# Patient Record
Sex: Male | Born: 1945 | Race: White | Hispanic: No | Marital: Married | Smoking: Former smoker
Health system: Southern US, Community
[De-identification: ages and names within clinical notes are randomized; demographics above are authoritative.]

## PROBLEM LIST (undated history)

## (undated) DIAGNOSIS — I4891 Unspecified atrial fibrillation: Secondary | ICD-10-CM

## (undated) DIAGNOSIS — G5600 Carpal tunnel syndrome, unspecified upper limb: Secondary | ICD-10-CM

## (undated) DIAGNOSIS — N200 Calculus of kidney: Secondary | ICD-10-CM

## (undated) DIAGNOSIS — I4892 Unspecified atrial flutter: Secondary | ICD-10-CM

## (undated) DIAGNOSIS — K644 Residual hemorrhoidal skin tags: Secondary | ICD-10-CM

## (undated) DIAGNOSIS — H409 Unspecified glaucoma: Secondary | ICD-10-CM

## (undated) DIAGNOSIS — E785 Hyperlipidemia, unspecified: Secondary | ICD-10-CM

## (undated) DIAGNOSIS — I10 Essential (primary) hypertension: Secondary | ICD-10-CM

## (undated) DIAGNOSIS — E039 Hypothyroidism, unspecified: Secondary | ICD-10-CM

## (undated) HISTORY — PX: CARDIOVERSION: SHX1299

## (undated) HISTORY — PX: OTHER SURGICAL HISTORY: SHX169

## (undated) HISTORY — DX: Calculus of kidney: N20.0

## (undated) HISTORY — DX: Unspecified atrial flutter: I48.92

## (undated) HISTORY — PX: JOINT REPLACEMENT: SHX530

## (undated) HISTORY — DX: Carpal tunnel syndrome, unspecified upper limb: G56.00

## (undated) HISTORY — DX: Hyperlipidemia, unspecified: E78.5

## (undated) HISTORY — PX: TONSILLECTOMY: SUR1361

## (undated) HISTORY — DX: Unspecified atrial fibrillation: I48.91

## (undated) HISTORY — DX: Residual hemorrhoidal skin tags: K64.4

## (undated) HISTORY — DX: Unspecified glaucoma: H40.9

## (undated) HISTORY — DX: Essential (primary) hypertension: I10

## (undated) HISTORY — PX: SQUAMOUS CELL CARCINOMA EXCISION: SHX2433

---

## 2000-03-29 ENCOUNTER — Ambulatory Visit (HOSPITAL_COMMUNITY): Admission: RE | Admit: 2000-03-29 | Discharge: 2000-03-29 | Payer: Self-pay | Admitting: Internal Medicine

## 2000-04-10 ENCOUNTER — Ambulatory Visit (HOSPITAL_COMMUNITY): Admission: RE | Admit: 2000-04-10 | Discharge: 2000-04-10 | Payer: Self-pay | Admitting: Internal Medicine

## 2000-04-10 ENCOUNTER — Encounter: Payer: Self-pay | Admitting: Internal Medicine

## 2000-04-17 ENCOUNTER — Ambulatory Visit (HOSPITAL_COMMUNITY): Admission: RE | Admit: 2000-04-17 | Discharge: 2000-04-17 | Payer: Self-pay | Admitting: Internal Medicine

## 2002-09-01 ENCOUNTER — Encounter: Payer: Self-pay | Admitting: Urology

## 2002-09-01 ENCOUNTER — Ambulatory Visit (HOSPITAL_BASED_OUTPATIENT_CLINIC_OR_DEPARTMENT_OTHER): Admission: RE | Admit: 2002-09-01 | Discharge: 2002-09-01 | Payer: Self-pay | Admitting: Urology

## 2002-09-11 ENCOUNTER — Ambulatory Visit (HOSPITAL_BASED_OUTPATIENT_CLINIC_OR_DEPARTMENT_OTHER): Admission: RE | Admit: 2002-09-11 | Discharge: 2002-09-11 | Payer: Self-pay | Admitting: Urology

## 2003-01-17 DIAGNOSIS — N200 Calculus of kidney: Secondary | ICD-10-CM

## 2003-01-17 HISTORY — PX: CARDIAC CATHETERIZATION: SHX172

## 2003-01-17 HISTORY — DX: Calculus of kidney: N20.0

## 2003-05-28 ENCOUNTER — Inpatient Hospital Stay (HOSPITAL_BASED_OUTPATIENT_CLINIC_OR_DEPARTMENT_OTHER): Admission: RE | Admit: 2003-05-28 | Discharge: 2003-05-28 | Payer: Self-pay | Admitting: Cardiology

## 2004-01-06 ENCOUNTER — Ambulatory Visit: Payer: Self-pay | Admitting: Internal Medicine

## 2004-05-06 ENCOUNTER — Ambulatory Visit: Payer: Self-pay | Admitting: Gastroenterology

## 2004-06-03 ENCOUNTER — Ambulatory Visit: Payer: Self-pay | Admitting: Gastroenterology

## 2004-06-03 ENCOUNTER — Encounter (INDEPENDENT_AMBULATORY_CARE_PROVIDER_SITE_OTHER): Payer: Self-pay | Admitting: *Deleted

## 2004-06-09 ENCOUNTER — Ambulatory Visit: Payer: Self-pay | Admitting: Internal Medicine

## 2004-07-07 ENCOUNTER — Ambulatory Visit: Payer: Self-pay | Admitting: Internal Medicine

## 2004-08-18 ENCOUNTER — Ambulatory Visit: Payer: Self-pay | Admitting: Internal Medicine

## 2004-10-17 ENCOUNTER — Ambulatory Visit: Payer: Self-pay | Admitting: Internal Medicine

## 2004-10-25 ENCOUNTER — Ambulatory Visit: Payer: Self-pay | Admitting: Internal Medicine

## 2004-11-04 ENCOUNTER — Ambulatory Visit: Payer: Self-pay | Admitting: Internal Medicine

## 2004-11-14 ENCOUNTER — Ambulatory Visit: Payer: Self-pay | Admitting: Internal Medicine

## 2005-04-14 ENCOUNTER — Ambulatory Visit: Payer: Self-pay | Admitting: Internal Medicine

## 2005-07-18 ENCOUNTER — Ambulatory Visit: Payer: Self-pay | Admitting: Internal Medicine

## 2005-10-26 ENCOUNTER — Ambulatory Visit: Payer: Self-pay | Admitting: Internal Medicine

## 2005-11-02 ENCOUNTER — Ambulatory Visit: Payer: Self-pay

## 2006-01-16 HISTORY — PX: TOTAL HIP ARTHROPLASTY: SHX124

## 2006-02-20 ENCOUNTER — Ambulatory Visit: Payer: Self-pay | Admitting: Internal Medicine

## 2006-02-20 LAB — CONVERTED CEMR LAB
Creatinine,U: 236.2 mg/dL
Hgb A1c MFr Bld: 6.4 % — ABNORMAL HIGH (ref 4.6–6.0)
INR: 2.5 — ABNORMAL HIGH (ref 0.9–2.0)
Microalb Creat Ratio: 5.5 mg/g (ref 0.0–30.0)
Microalb, Ur: 1.3 mg/dL (ref 0.0–1.9)
Prothrombin Time: 20 s — ABNORMAL HIGH (ref 10.0–14.0)

## 2006-03-16 ENCOUNTER — Ambulatory Visit: Payer: Self-pay | Admitting: Internal Medicine

## 2006-03-30 ENCOUNTER — Ambulatory Visit: Payer: Self-pay | Admitting: Internal Medicine

## 2006-03-30 LAB — CONVERTED CEMR LAB
INR: 3.1 — ABNORMAL HIGH (ref 0.0–1.5)
Prothrombin Time: 34.2 s — ABNORMAL HIGH (ref 11.6–15.2)

## 2006-04-16 ENCOUNTER — Ambulatory Visit: Payer: Self-pay | Admitting: Internal Medicine

## 2006-04-27 ENCOUNTER — Ambulatory Visit: Payer: Self-pay | Admitting: Internal Medicine

## 2006-04-27 ENCOUNTER — Encounter: Admission: RE | Admit: 2006-04-27 | Discharge: 2006-04-27 | Payer: Self-pay | Admitting: Orthopedic Surgery

## 2006-05-10 ENCOUNTER — Ambulatory Visit: Payer: Self-pay | Admitting: Internal Medicine

## 2006-05-10 LAB — CONVERTED CEMR LAB
INR: 2.6 — ABNORMAL HIGH (ref 0.0–1.5)
Prothrombin Time: 29.7 s — ABNORMAL HIGH (ref 11.6–15.2)

## 2006-06-25 ENCOUNTER — Ambulatory Visit: Payer: Self-pay | Admitting: Internal Medicine

## 2006-06-26 LAB — CONVERTED CEMR LAB
ALT: 20 units/L (ref 0–40)
AST: 23 units/L (ref 0–37)
BUN: 32 mg/dL — ABNORMAL HIGH (ref 6–23)
Cholesterol: 144 mg/dL (ref 0–200)
Creatinine, Ser: 1.2 mg/dL (ref 0.4–1.5)
Creatinine,U: 195 mg/dL
HDL: 48.8 mg/dL (ref >39.0)
Hgb A1c MFr Bld: 6.1 % — ABNORMAL HIGH (ref 4.6–6.0)
INR: 2.2 — ABNORMAL HIGH (ref 0.9–2.0)
LDL Cholesterol: 78 mg/dL (ref 0–99)
Microalb Creat Ratio: 5.1 mg/g (ref 0.0–30.0)
Microalb, Ur: 1 mg/dL (ref 0.0–1.9)
Prothrombin Time: 18.4 s — ABNORMAL HIGH (ref 10.0–14.0)
Total CHOL/HDL Ratio: 3
Triglycerides: 87 mg/dL (ref 0–149)
VLDL: 17 mg/dL (ref 0–40)

## 2006-07-19 ENCOUNTER — Telehealth: Payer: Self-pay | Admitting: Internal Medicine

## 2006-07-25 ENCOUNTER — Telehealth: Payer: Self-pay | Admitting: Internal Medicine

## 2006-07-31 ENCOUNTER — Ambulatory Visit: Payer: Self-pay | Admitting: Internal Medicine

## 2006-07-31 DIAGNOSIS — M1610 Unilateral primary osteoarthritis, unspecified hip: Secondary | ICD-10-CM | POA: Insufficient documentation

## 2006-07-31 DIAGNOSIS — M169 Osteoarthritis of hip, unspecified: Secondary | ICD-10-CM | POA: Insufficient documentation

## 2006-08-07 ENCOUNTER — Inpatient Hospital Stay (HOSPITAL_COMMUNITY): Admission: RE | Admit: 2006-08-07 | Discharge: 2006-08-10 | Payer: Self-pay | Admitting: Orthopedic Surgery

## 2006-08-07 ENCOUNTER — Encounter: Payer: Self-pay | Admitting: Internal Medicine

## 2006-08-17 ENCOUNTER — Telehealth: Payer: Self-pay | Admitting: Internal Medicine

## 2006-09-07 ENCOUNTER — Ambulatory Visit: Payer: Self-pay | Admitting: Cardiovascular Disease

## 2006-09-10 ENCOUNTER — Ambulatory Visit: Payer: Self-pay | Admitting: Internal Medicine

## 2006-09-10 LAB — CONVERTED CEMR LAB
BUN: 35 mg/dL — ABNORMAL HIGH (ref 6–23)
Basophils Absolute: 0.1 10*3/uL (ref 0.0–0.1)
Basophils Relative: 1.2 % — ABNORMAL HIGH (ref 0.0–1.0)
CO2: 27 meq/L (ref 19–32)
Calcium: 10.7 mg/dL — ABNORMAL HIGH (ref 8.4–10.5)
Chloride: 104 meq/L (ref 96–112)
Creatinine, Ser: 1.6 mg/dL — ABNORMAL HIGH (ref 0.4–1.5)
Eosinophils Absolute: 0.3 10*3/uL (ref 0.0–0.6)
Eosinophils Relative: 4.8 % (ref 0.0–5.0)
GFR calc Af Amer: 57 mL/min
GFR calc non Af Amer: 47 mL/min
Glucose, Bld: 125 mg/dL — ABNORMAL HIGH (ref 70–99)
HCT: 37.9 % — ABNORMAL LOW (ref 39.0–52.0)
Hemoglobin: 12.7 g/dL — ABNORMAL LOW (ref 13.0–17.0)
INR: 3.4 — ABNORMAL HIGH (ref 0.8–1.0)
Lymphocytes Relative: 25.7 % (ref 12.0–46.0)
MCHC: 33.4 g/dL (ref 30.0–36.0)
MCV: 88.7 fL (ref 78.0–100.0)
Magnesium: 1.8 mg/dL (ref 1.5–2.5)
Monocytes Absolute: 0.7 10*3/uL (ref 0.2–0.7)
Monocytes Relative: 10.8 % (ref 3.0–11.0)
Neutro Abs: 3.5 10*3/uL (ref 1.4–7.7)
Neutrophils Relative %: 57.5 % (ref 43.0–77.0)
Platelets: 261 10*3/uL (ref 150–400)
Potassium: 3.9 meq/L (ref 3.5–5.1)
Prothrombin Time: 23.3 s — ABNORMAL HIGH (ref 10.9–13.3)
RBC: 4.27 M/uL (ref 4.22–5.81)
RDW: 14.6 % (ref 11.5–14.6)
Sodium: 140 meq/L (ref 135–145)
WBC: 6.2 10*3/uL (ref 4.5–10.5)
aPTT: 56 s — ABNORMAL HIGH (ref 21.7–29.8)

## 2006-09-12 ENCOUNTER — Ambulatory Visit (HOSPITAL_COMMUNITY): Admission: RE | Admit: 2006-09-12 | Discharge: 2006-09-12 | Payer: Self-pay | Admitting: Internal Medicine

## 2006-09-12 ENCOUNTER — Ambulatory Visit: Payer: Self-pay | Admitting: Internal Medicine

## 2006-09-18 ENCOUNTER — Ambulatory Visit: Payer: Self-pay | Admitting: Internal Medicine

## 2006-10-31 ENCOUNTER — Encounter: Payer: Self-pay | Admitting: *Deleted

## 2006-10-31 LAB — CONVERTED CEMR LAB: INR: 2.6

## 2006-11-15 ENCOUNTER — Telehealth (INDEPENDENT_AMBULATORY_CARE_PROVIDER_SITE_OTHER): Payer: Self-pay | Admitting: *Deleted

## 2006-11-19 ENCOUNTER — Telehealth (INDEPENDENT_AMBULATORY_CARE_PROVIDER_SITE_OTHER): Payer: Self-pay | Admitting: *Deleted

## 2006-11-27 ENCOUNTER — Telehealth (INDEPENDENT_AMBULATORY_CARE_PROVIDER_SITE_OTHER): Payer: Self-pay | Admitting: *Deleted

## 2006-12-21 ENCOUNTER — Ambulatory Visit: Payer: Self-pay | Admitting: Internal Medicine

## 2007-01-04 ENCOUNTER — Encounter: Payer: Self-pay | Admitting: Internal Medicine

## 2007-01-07 ENCOUNTER — Telehealth: Payer: Self-pay | Admitting: Internal Medicine

## 2007-01-07 LAB — CONVERTED CEMR LAB
INR: 2.3 — ABNORMAL HIGH (ref 0.0–1.5)
Prothrombin Time: 25.6 s — ABNORMAL HIGH (ref 11.6–15.2)

## 2007-01-17 HISTORY — PX: COLONOSCOPY: SHX174

## 2007-03-02 ENCOUNTER — Encounter: Payer: Self-pay | Admitting: Internal Medicine

## 2007-03-05 ENCOUNTER — Encounter (INDEPENDENT_AMBULATORY_CARE_PROVIDER_SITE_OTHER): Payer: Self-pay | Admitting: *Deleted

## 2007-03-05 LAB — CONVERTED CEMR LAB
INR: 2.6 — ABNORMAL HIGH (ref 0.0–1.5)
Prothrombin Time: 28.9 s — ABNORMAL HIGH (ref 11.6–15.2)

## 2007-03-21 ENCOUNTER — Telehealth (INDEPENDENT_AMBULATORY_CARE_PROVIDER_SITE_OTHER): Payer: Self-pay | Admitting: *Deleted

## 2007-04-12 ENCOUNTER — Encounter: Payer: Self-pay | Admitting: Internal Medicine

## 2007-04-23 LAB — CONVERTED CEMR LAB
INR: 2.6 — ABNORMAL HIGH (ref 0.0–1.5)
Prothrombin Time: 28.6 s — ABNORMAL HIGH (ref 11.6–15.2)

## 2007-04-24 ENCOUNTER — Encounter (INDEPENDENT_AMBULATORY_CARE_PROVIDER_SITE_OTHER): Payer: Self-pay | Admitting: *Deleted

## 2007-04-25 ENCOUNTER — Encounter (INDEPENDENT_AMBULATORY_CARE_PROVIDER_SITE_OTHER): Payer: Self-pay | Admitting: *Deleted

## 2007-04-25 ENCOUNTER — Ambulatory Visit: Payer: Self-pay | Admitting: Internal Medicine

## 2007-04-25 DIAGNOSIS — N401 Enlarged prostate with lower urinary tract symptoms: Secondary | ICD-10-CM

## 2007-04-25 DIAGNOSIS — N138 Other obstructive and reflux uropathy: Secondary | ICD-10-CM | POA: Insufficient documentation

## 2007-04-30 ENCOUNTER — Ambulatory Visit: Payer: Self-pay | Admitting: Gastroenterology

## 2007-04-30 LAB — CONVERTED CEMR LAB
ALT: 18 units/L (ref 0–53)
AST: 20 units/L (ref 0–37)
Albumin: 4.3 g/dL (ref 3.5–5.2)
Alkaline Phosphatase: 23 units/L — ABNORMAL LOW (ref 39–117)
BUN: 35 mg/dL — ABNORMAL HIGH (ref 6–23)
Basophils Absolute: 0 10*3/uL (ref 0.0–0.1)
Basophils Relative: 0.1 % (ref 0.0–1.0)
Bilirubin, Direct: 0.1 mg/dL (ref 0.0–0.3)
Cholesterol: 141 mg/dL (ref 0–200)
Creatinine, Ser: 1.4 mg/dL (ref 0.4–1.5)
Creatinine,U: 138.1 mg/dL
Eosinophils Absolute: 0.5 10*3/uL (ref 0.0–0.7)
Eosinophils Relative: 8.3 % — ABNORMAL HIGH (ref 0.0–5.0)
HCT: 41.1 % (ref 39.0–52.0)
HDL: 46.8 mg/dL (ref >39.0)
Hemoglobin: 13.5 g/dL (ref 13.0–17.0)
Hgb A1c MFr Bld: 6.1 % — ABNORMAL HIGH (ref 4.6–6.0)
LDL Cholesterol: 76 mg/dL (ref 0–99)
Lymphocytes Relative: 26.7 % (ref 12.0–46.0)
MCHC: 32.9 g/dL (ref 30.0–36.0)
MCV: 89 fL (ref 78.0–100.0)
Microalb Creat Ratio: 38.4 mg/g — ABNORMAL HIGH (ref 0.0–30.0)
Microalb, Ur: 5.3 mg/dL — ABNORMAL HIGH (ref 0.0–1.9)
Monocytes Absolute: 0.6 10*3/uL (ref 0.1–1.0)
Monocytes Relative: 9.8 % (ref 3.0–12.0)
Neutro Abs: 3.5 10*3/uL (ref 1.4–7.7)
Neutrophils Relative %: 55.1 % (ref 43.0–77.0)
PSA: 0.76 ng/mL (ref 0.10–4.00)
Platelets: 234 10*3/uL (ref 150–400)
Potassium: 4.4 meq/L (ref 3.5–5.1)
RBC: 4.62 M/uL (ref 4.22–5.81)
RDW: 14.7 % — ABNORMAL HIGH (ref 11.5–14.6)
TSH: 2.72 microintl units/mL (ref 0.35–5.50)
Total Bilirubin: 0.6 mg/dL (ref 0.3–1.2)
Total CHOL/HDL Ratio: 3
Total Protein: 7.4 g/dL (ref 6.0–8.3)
Triglycerides: 91 mg/dL (ref 0–149)
VLDL: 18 mg/dL (ref 0–40)
WBC: 6.3 10*3/uL (ref 4.5–10.5)

## 2007-05-01 ENCOUNTER — Encounter: Payer: Self-pay | Admitting: Internal Medicine

## 2007-05-01 ENCOUNTER — Encounter (INDEPENDENT_AMBULATORY_CARE_PROVIDER_SITE_OTHER): Payer: Self-pay | Admitting: *Deleted

## 2007-05-01 LAB — CONVERTED CEMR LAB
Cholesterol, target level: 200 mg/dL
HDL goal, serum: 40 mg/dL
LDL Goal: 100 mg/dL

## 2007-05-06 ENCOUNTER — Encounter: Payer: Self-pay | Admitting: Internal Medicine

## 2007-05-06 ENCOUNTER — Ambulatory Visit: Payer: Self-pay | Admitting: Gastroenterology

## 2007-05-06 LAB — HM COLONOSCOPY

## 2007-05-25 ENCOUNTER — Encounter: Payer: Self-pay | Admitting: Internal Medicine

## 2007-05-30 LAB — CONVERTED CEMR LAB
INR: 2.4 — ABNORMAL HIGH (ref 0.0–1.5)
Prothrombin Time: 26.7 s — ABNORMAL HIGH (ref 11.6–15.2)

## 2007-07-16 ENCOUNTER — Telehealth: Payer: Self-pay | Admitting: Internal Medicine

## 2007-07-22 ENCOUNTER — Telehealth: Payer: Self-pay | Admitting: Internal Medicine

## 2007-08-09 ENCOUNTER — Encounter: Payer: Self-pay | Admitting: Internal Medicine

## 2007-08-09 ENCOUNTER — Telehealth (INDEPENDENT_AMBULATORY_CARE_PROVIDER_SITE_OTHER): Payer: Self-pay | Admitting: *Deleted

## 2007-09-06 ENCOUNTER — Ambulatory Visit: Payer: Self-pay | Admitting: Internal Medicine

## 2007-09-06 DIAGNOSIS — E1159 Type 2 diabetes mellitus with other circulatory complications: Secondary | ICD-10-CM | POA: Insufficient documentation

## 2007-09-06 DIAGNOSIS — E785 Hyperlipidemia, unspecified: Secondary | ICD-10-CM | POA: Insufficient documentation

## 2007-09-06 DIAGNOSIS — G5600 Carpal tunnel syndrome, unspecified upper limb: Secondary | ICD-10-CM | POA: Insufficient documentation

## 2007-09-06 DIAGNOSIS — I10 Essential (primary) hypertension: Secondary | ICD-10-CM | POA: Insufficient documentation

## 2007-09-06 DIAGNOSIS — E119 Type 2 diabetes mellitus without complications: Secondary | ICD-10-CM | POA: Insufficient documentation

## 2007-09-06 DIAGNOSIS — E1169 Type 2 diabetes mellitus with other specified complication: Secondary | ICD-10-CM | POA: Insufficient documentation

## 2007-09-18 ENCOUNTER — Telehealth (INDEPENDENT_AMBULATORY_CARE_PROVIDER_SITE_OTHER): Payer: Self-pay | Admitting: *Deleted

## 2007-10-21 ENCOUNTER — Ambulatory Visit: Payer: Self-pay | Admitting: Internal Medicine

## 2007-10-21 LAB — CONVERTED CEMR LAB: INR: 3

## 2007-12-09 ENCOUNTER — Ambulatory Visit: Payer: Self-pay | Admitting: Internal Medicine

## 2007-12-09 LAB — CONVERTED CEMR LAB: INR: 3.3

## 2007-12-16 ENCOUNTER — Ambulatory Visit: Payer: Self-pay | Admitting: Internal Medicine

## 2008-01-24 ENCOUNTER — Ambulatory Visit: Payer: Self-pay | Admitting: Internal Medicine

## 2008-01-24 LAB — CONVERTED CEMR LAB: INR: 2.3

## 2008-02-04 ENCOUNTER — Telehealth (INDEPENDENT_AMBULATORY_CARE_PROVIDER_SITE_OTHER): Payer: Self-pay | Admitting: *Deleted

## 2008-02-27 ENCOUNTER — Ambulatory Visit: Payer: Self-pay | Admitting: Internal Medicine

## 2008-02-27 LAB — CONVERTED CEMR LAB: INR: 2.9

## 2008-04-01 ENCOUNTER — Ambulatory Visit: Payer: Self-pay | Admitting: Internal Medicine

## 2008-04-08 ENCOUNTER — Telehealth (INDEPENDENT_AMBULATORY_CARE_PROVIDER_SITE_OTHER): Payer: Self-pay | Admitting: *Deleted

## 2008-04-08 LAB — CONVERTED CEMR LAB
Creatinine,U: 111.3 mg/dL
Hgb A1c MFr Bld: 6 % (ref 4.6–6.5)
INR: 2 — ABNORMAL HIGH (ref 0.8–1.0)
Microalb Creat Ratio: 14.4 mg/g (ref 0.0–30.0)
Microalb, Ur: 1.6 mg/dL (ref 0.0–1.9)
Prothrombin Time: 20.7 s — ABNORMAL HIGH (ref 10.9–13.3)

## 2008-04-14 ENCOUNTER — Ambulatory Visit (HOSPITAL_COMMUNITY): Admission: RE | Admit: 2008-04-14 | Discharge: 2008-04-14 | Payer: Self-pay | Admitting: Internal Medicine

## 2008-04-14 ENCOUNTER — Ambulatory Visit: Payer: Self-pay | Admitting: Internal Medicine

## 2008-04-24 ENCOUNTER — Ambulatory Visit (HOSPITAL_COMMUNITY): Admission: RE | Admit: 2008-04-24 | Discharge: 2008-04-24 | Payer: Self-pay | Admitting: Internal Medicine

## 2008-04-24 ENCOUNTER — Ambulatory Visit: Payer: Self-pay | Admitting: Internal Medicine

## 2008-04-28 ENCOUNTER — Encounter: Payer: Self-pay | Admitting: Internal Medicine

## 2008-04-28 ENCOUNTER — Ambulatory Visit: Payer: Self-pay | Admitting: Internal Medicine

## 2008-05-01 ENCOUNTER — Ambulatory Visit: Payer: Self-pay | Admitting: Internal Medicine

## 2008-05-01 DIAGNOSIS — R6882 Decreased libido: Secondary | ICD-10-CM | POA: Insufficient documentation

## 2008-05-01 DIAGNOSIS — F528 Other sexual dysfunction not due to a substance or known physiological condition: Secondary | ICD-10-CM | POA: Insufficient documentation

## 2008-05-01 LAB — CONVERTED CEMR LAB: INR: 2.4

## 2008-05-04 ENCOUNTER — Telehealth: Payer: Self-pay | Admitting: Internal Medicine

## 2008-05-08 ENCOUNTER — Ambulatory Visit: Payer: Self-pay | Admitting: Internal Medicine

## 2008-05-08 ENCOUNTER — Encounter: Payer: Self-pay | Admitting: Internal Medicine

## 2008-05-08 ENCOUNTER — Encounter (INDEPENDENT_AMBULATORY_CARE_PROVIDER_SITE_OTHER): Payer: Self-pay | Admitting: *Deleted

## 2008-05-10 LAB — CONVERTED CEMR LAB
BUN: 32 mg/dL — ABNORMAL HIGH (ref 6–23)
Basophils Absolute: 0.1 10*3/uL (ref 0.0–0.1)
Basophils Relative: 1 % (ref 0.0–3.0)
CO2: 30 meq/L (ref 19–32)
Calcium: 9.8 mg/dL (ref 8.4–10.5)
Chloride: 107 meq/L (ref 96–112)
Creatinine, Ser: 1.5 mg/dL (ref 0.4–1.5)
Eosinophils Absolute: 0.4 10*3/uL (ref 0.0–0.7)
Eosinophils Relative: 7.1 % — ABNORMAL HIGH (ref 0.0–5.0)
Free T4: 1 ng/dL (ref 0.6–1.6)
GFR calc non Af Amer: 50.48 mL/min (ref >60)
Glucose, Bld: 86 mg/dL (ref 70–99)
HCT: 41.7 % (ref 39.0–52.0)
Hemoglobin: 14 g/dL (ref 13.0–17.0)
INR: 3.1 — ABNORMAL HIGH (ref 0.8–1.0)
Lymphocytes Relative: 28.2 % (ref 12.0–46.0)
Lymphs Abs: 1.6 10*3/uL (ref 0.7–4.0)
MCHC: 33.6 g/dL (ref 30.0–36.0)
MCV: 90.8 fL (ref 78.0–100.0)
Magnesium: 2.5 mg/dL (ref 1.5–2.5)
Monocytes Absolute: 0.7 10*3/uL (ref 0.1–1.0)
Monocytes Relative: 11.8 % (ref 3.0–12.0)
Neutro Abs: 2.9 10*3/uL (ref 1.4–7.7)
Neutrophils Relative %: 51.9 % (ref 43.0–77.0)
Platelets: 225 10*3/uL (ref 150.0–400.0)
Potassium: 3.9 meq/L (ref 3.5–5.1)
Prothrombin Time: 31.8 s — ABNORMAL HIGH (ref 10.9–13.3)
RBC: 4.59 M/uL (ref 4.22–5.81)
RDW: 14.6 % (ref 11.5–14.6)
Sodium: 143 meq/L (ref 135–145)
TSH: 2.86 microintl units/mL (ref 0.35–5.50)
Testosterone: 376.47 ng/dL (ref 350.00–890.00)
WBC: 5.7 10*3/uL (ref 4.5–10.5)
aPTT: 43.8 s — ABNORMAL HIGH (ref 21.7–28.8)

## 2008-05-11 ENCOUNTER — Encounter (INDEPENDENT_AMBULATORY_CARE_PROVIDER_SITE_OTHER): Payer: Self-pay | Admitting: *Deleted

## 2008-05-15 ENCOUNTER — Ambulatory Visit: Payer: Self-pay | Admitting: Internal Medicine

## 2008-05-15 ENCOUNTER — Ambulatory Visit (HOSPITAL_COMMUNITY): Admission: RE | Admit: 2008-05-15 | Discharge: 2008-05-15 | Payer: Self-pay | Admitting: Internal Medicine

## 2008-05-19 ENCOUNTER — Telehealth: Payer: Self-pay | Admitting: Internal Medicine

## 2008-05-20 ENCOUNTER — Encounter: Payer: Self-pay | Admitting: Internal Medicine

## 2008-05-21 ENCOUNTER — Telehealth: Payer: Self-pay | Admitting: Internal Medicine

## 2008-05-25 ENCOUNTER — Encounter: Payer: Self-pay | Admitting: Internal Medicine

## 2008-05-28 ENCOUNTER — Telehealth: Payer: Self-pay | Admitting: Internal Medicine

## 2008-05-29 ENCOUNTER — Telehealth: Payer: Self-pay | Admitting: Internal Medicine

## 2008-06-03 ENCOUNTER — Encounter: Payer: Self-pay | Admitting: Internal Medicine

## 2008-06-09 ENCOUNTER — Ambulatory Visit: Payer: Self-pay | Admitting: Internal Medicine

## 2008-06-11 ENCOUNTER — Telehealth: Payer: Self-pay | Admitting: Internal Medicine

## 2008-06-12 ENCOUNTER — Ambulatory Visit: Payer: Self-pay | Admitting: Internal Medicine

## 2008-06-12 ENCOUNTER — Inpatient Hospital Stay (HOSPITAL_COMMUNITY): Admission: AD | Admit: 2008-06-12 | Discharge: 2008-06-15 | Payer: Self-pay | Admitting: Internal Medicine

## 2008-06-12 LAB — CONVERTED CEMR LAB
BUN: 40 mg/dL — ABNORMAL HIGH (ref 6–23)
CO2: 29 meq/L (ref 19–32)
Calcium: 10.2 mg/dL (ref 8.4–10.5)
Chloride: 108 meq/L (ref 96–112)
Creatinine, Ser: 1.6 mg/dL — ABNORMAL HIGH (ref 0.4–1.5)
GFR calc non Af Amer: 46.84 mL/min (ref >60)
Glucose, Bld: 80 mg/dL (ref 70–99)
INR: 4.9 — ABNORMAL HIGH (ref 0.8–1.0)
Magnesium: 2.2 mg/dL (ref 1.5–2.5)
Potassium: 4.7 meq/L (ref 3.5–5.1)
Prothrombin Time: 48 s — ABNORMAL HIGH (ref 10.9–13.3)
Sodium: 143 meq/L (ref 135–145)

## 2008-06-18 ENCOUNTER — Telehealth: Payer: Self-pay | Admitting: Cardiology

## 2008-06-18 ENCOUNTER — Encounter (INDEPENDENT_AMBULATORY_CARE_PROVIDER_SITE_OTHER): Payer: Self-pay | Admitting: *Deleted

## 2008-06-18 ENCOUNTER — Telehealth: Payer: Self-pay | Admitting: Internal Medicine

## 2008-06-18 ENCOUNTER — Telehealth (INDEPENDENT_AMBULATORY_CARE_PROVIDER_SITE_OTHER): Payer: Self-pay | Admitting: *Deleted

## 2008-06-22 ENCOUNTER — Telehealth: Payer: Self-pay | Admitting: Internal Medicine

## 2008-06-23 ENCOUNTER — Encounter: Payer: Self-pay | Admitting: Internal Medicine

## 2008-06-29 ENCOUNTER — Encounter: Payer: Self-pay | Admitting: Internal Medicine

## 2008-06-30 ENCOUNTER — Telehealth: Payer: Self-pay | Admitting: Internal Medicine

## 2008-07-01 ENCOUNTER — Encounter: Payer: Self-pay | Admitting: Internal Medicine

## 2008-07-06 ENCOUNTER — Ambulatory Visit: Payer: Self-pay | Admitting: Internal Medicine

## 2008-07-06 LAB — CONVERTED CEMR LAB: INR: 4.9

## 2008-07-13 ENCOUNTER — Ambulatory Visit: Payer: Self-pay | Admitting: Internal Medicine

## 2008-07-13 LAB — CONVERTED CEMR LAB
INR: 2.9
Prothrombin Time: 20.5 s

## 2008-07-16 ENCOUNTER — Ambulatory Visit (HOSPITAL_COMMUNITY): Admission: RE | Admit: 2008-07-16 | Discharge: 2008-07-16 | Payer: Self-pay | Admitting: Internal Medicine

## 2008-07-16 ENCOUNTER — Ambulatory Visit: Payer: Self-pay | Admitting: Internal Medicine

## 2008-07-22 ENCOUNTER — Telehealth: Payer: Self-pay | Admitting: Internal Medicine

## 2008-07-31 ENCOUNTER — Ambulatory Visit: Payer: Self-pay | Admitting: Internal Medicine

## 2008-08-05 ENCOUNTER — Ambulatory Visit: Payer: Self-pay

## 2008-08-05 ENCOUNTER — Encounter: Payer: Self-pay | Admitting: Internal Medicine

## 2008-08-10 ENCOUNTER — Telehealth: Payer: Self-pay | Admitting: Internal Medicine

## 2008-08-11 ENCOUNTER — Encounter: Payer: Self-pay | Admitting: Internal Medicine

## 2008-08-13 ENCOUNTER — Ambulatory Visit: Payer: Self-pay | Admitting: Internal Medicine

## 2008-08-13 LAB — CONVERTED CEMR LAB: INR: 6.3

## 2008-08-14 ENCOUNTER — Ambulatory Visit: Payer: Self-pay | Admitting: Internal Medicine

## 2008-08-14 LAB — CONVERTED CEMR LAB: INR: 5.6

## 2008-08-17 ENCOUNTER — Ambulatory Visit: Payer: Self-pay | Admitting: Internal Medicine

## 2008-08-17 LAB — CONVERTED CEMR LAB: INR: 2.1

## 2008-08-18 ENCOUNTER — Telehealth: Payer: Self-pay | Admitting: Internal Medicine

## 2008-08-19 ENCOUNTER — Encounter (INDEPENDENT_AMBULATORY_CARE_PROVIDER_SITE_OTHER): Payer: Self-pay | Admitting: *Deleted

## 2008-08-19 ENCOUNTER — Ambulatory Visit: Payer: Self-pay | Admitting: Internal Medicine

## 2008-08-21 ENCOUNTER — Telehealth (INDEPENDENT_AMBULATORY_CARE_PROVIDER_SITE_OTHER): Payer: Self-pay | Admitting: *Deleted

## 2008-08-21 LAB — CONVERTED CEMR LAB
BUN: 38 mg/dL — ABNORMAL HIGH (ref 6–23)
Basophils Absolute: 0 10*3/uL (ref 0.0–0.1)
Basophils Relative: 0.7 % (ref 0.0–3.0)
CO2: 29 meq/L (ref 19–32)
Calcium: 9.9 mg/dL (ref 8.4–10.5)
Chloride: 109 meq/L (ref 96–112)
Creatinine, Ser: 1.7 mg/dL — ABNORMAL HIGH (ref 0.4–1.5)
Eosinophils Absolute: 0.3 10*3/uL (ref 0.0–0.7)
Eosinophils Relative: 4.2 % (ref 0.0–5.0)
GFR calc non Af Amer: 43.65 mL/min (ref >60)
Glucose, Bld: 78 mg/dL (ref 70–99)
HCT: 40.3 % (ref 39.0–52.0)
Hemoglobin: 13.7 g/dL (ref 13.0–17.0)
INR: 2.3 — ABNORMAL HIGH (ref 0.8–1.0)
Lymphocytes Relative: 20.4 % (ref 12.0–46.0)
Lymphs Abs: 1.3 10*3/uL (ref 0.7–4.0)
MCHC: 33.9 g/dL (ref 30.0–36.0)
MCV: 88.7 fL (ref 78.0–100.0)
Monocytes Absolute: 0.6 10*3/uL (ref 0.1–1.0)
Monocytes Relative: 9.9 % (ref 3.0–12.0)
Neutro Abs: 4 10*3/uL (ref 1.4–7.7)
Neutrophils Relative %: 64.8 % (ref 43.0–77.0)
Platelets: 208 10*3/uL (ref 150.0–400.0)
Potassium: 4.7 meq/L (ref 3.5–5.1)
Prothrombin Time: 23.9 s — ABNORMAL HIGH (ref 9.1–11.7)
RBC: 4.55 M/uL (ref 4.22–5.81)
RDW: 15.4 % — ABNORMAL HIGH (ref 11.5–14.6)
Sodium: 145 meq/L (ref 135–145)
WBC: 6.2 10*3/uL (ref 4.5–10.5)
aPTT: 35.2 s — ABNORMAL HIGH (ref 21.7–28.8)

## 2008-08-24 ENCOUNTER — Encounter: Payer: Self-pay | Admitting: Internal Medicine

## 2008-08-24 ENCOUNTER — Ambulatory Visit: Payer: Self-pay | Admitting: Internal Medicine

## 2008-08-24 ENCOUNTER — Ambulatory Visit (HOSPITAL_COMMUNITY): Admission: RE | Admit: 2008-08-24 | Discharge: 2008-08-24 | Payer: Self-pay | Admitting: Internal Medicine

## 2008-08-25 ENCOUNTER — Telehealth: Payer: Self-pay | Admitting: Internal Medicine

## 2008-08-25 ENCOUNTER — Ambulatory Visit (HOSPITAL_COMMUNITY): Admission: RE | Admit: 2008-08-25 | Discharge: 2008-08-25 | Payer: Self-pay | Admitting: Internal Medicine

## 2008-08-28 ENCOUNTER — Ambulatory Visit: Payer: Self-pay | Admitting: Internal Medicine

## 2008-08-29 LAB — CONVERTED CEMR LAB
BUN: 37 mg/dL — ABNORMAL HIGH (ref 6–23)
Creatinine, Ser: 1.64 mg/dL — ABNORMAL HIGH (ref 0.40–1.50)

## 2008-08-31 ENCOUNTER — Encounter (INDEPENDENT_AMBULATORY_CARE_PROVIDER_SITE_OTHER): Payer: Self-pay | Admitting: *Deleted

## 2008-08-31 ENCOUNTER — Telehealth (INDEPENDENT_AMBULATORY_CARE_PROVIDER_SITE_OTHER): Payer: Self-pay | Admitting: *Deleted

## 2008-08-31 ENCOUNTER — Telehealth: Payer: Self-pay | Admitting: Internal Medicine

## 2008-09-04 ENCOUNTER — Encounter: Payer: Self-pay | Admitting: Internal Medicine

## 2008-09-04 ENCOUNTER — Ambulatory Visit: Payer: Self-pay | Admitting: Internal Medicine

## 2008-09-04 LAB — CONVERTED CEMR LAB: INR: 1.6

## 2008-09-07 ENCOUNTER — Telehealth: Payer: Self-pay | Admitting: Internal Medicine

## 2008-09-08 ENCOUNTER — Telehealth: Payer: Self-pay | Admitting: Internal Medicine

## 2008-09-10 ENCOUNTER — Telehealth: Payer: Self-pay | Admitting: Internal Medicine

## 2008-09-14 ENCOUNTER — Ambulatory Visit: Payer: Self-pay | Admitting: Internal Medicine

## 2008-09-14 ENCOUNTER — Telehealth (INDEPENDENT_AMBULATORY_CARE_PROVIDER_SITE_OTHER): Payer: Self-pay | Admitting: *Deleted

## 2008-09-15 ENCOUNTER — Encounter: Payer: Self-pay | Admitting: Cardiovascular Disease

## 2008-09-15 LAB — CONVERTED CEMR LAB: POC INR: 1.9

## 2008-09-18 ENCOUNTER — Ambulatory Visit: Payer: Self-pay | Admitting: Internal Medicine

## 2008-09-18 LAB — CONVERTED CEMR LAB: POC INR: 2.5

## 2008-09-20 LAB — CONVERTED CEMR LAB
BUN: 47 mg/dL — ABNORMAL HIGH (ref 6–23)
Basophils Absolute: 0.1 10*3/uL (ref 0.0–0.1)
Basophils Relative: 1.2 % (ref 0.0–3.0)
CO2: 29 meq/L (ref 19–32)
Calcium: 9.6 mg/dL (ref 8.4–10.5)
Chloride: 109 meq/L (ref 96–112)
Creatinine, Ser: 2.4 mg/dL — ABNORMAL HIGH (ref 0.4–1.5)
Eosinophils Absolute: 0.4 10*3/uL (ref 0.0–0.7)
Eosinophils Relative: 6.3 % — ABNORMAL HIGH (ref 0.0–5.0)
GFR calc non Af Amer: 29.31 mL/min (ref >60)
Glucose, Bld: 118 mg/dL — ABNORMAL HIGH (ref 70–99)
HCT: 40.1 % (ref 39.0–52.0)
Hemoglobin: 13.6 g/dL (ref 13.0–17.0)
INR: 1.9 — ABNORMAL HIGH (ref 0.8–1.0)
Lymphocytes Relative: 24.9 % (ref 12.0–46.0)
Lymphs Abs: 1.4 10*3/uL (ref 0.7–4.0)
MCHC: 34 g/dL (ref 30.0–36.0)
MCV: 90.6 fL (ref 78.0–100.0)
Monocytes Absolute: 0.7 10*3/uL (ref 0.1–1.0)
Monocytes Relative: 12.5 % — ABNORMAL HIGH (ref 3.0–12.0)
Neutro Abs: 3 10*3/uL (ref 1.4–7.7)
Neutrophils Relative %: 55.1 % (ref 43.0–77.0)
Platelets: 223 10*3/uL (ref 150.0–400.0)
Potassium: 4.8 meq/L (ref 3.5–5.1)
Prothrombin Time: 19.4 s — ABNORMAL HIGH (ref 9.1–11.7)
RBC: 4.42 M/uL (ref 4.22–5.81)
RDW: 16.3 % — ABNORMAL HIGH (ref 11.5–14.6)
Sodium: 143 meq/L (ref 135–145)
WBC: 5.6 10*3/uL (ref 4.5–10.5)
aPTT: 33.5 s — ABNORMAL HIGH (ref 21.7–28.8)

## 2008-09-22 ENCOUNTER — Encounter: Payer: Self-pay | Admitting: Internal Medicine

## 2008-09-22 ENCOUNTER — Observation Stay (HOSPITAL_COMMUNITY): Admission: RE | Admit: 2008-09-22 | Discharge: 2008-09-23 | Payer: Self-pay | Admitting: Internal Medicine

## 2008-09-22 ENCOUNTER — Ambulatory Visit: Payer: Self-pay | Admitting: Internal Medicine

## 2008-09-25 ENCOUNTER — Telehealth (INDEPENDENT_AMBULATORY_CARE_PROVIDER_SITE_OTHER): Payer: Self-pay | Admitting: *Deleted

## 2008-09-28 ENCOUNTER — Ambulatory Visit: Payer: Self-pay | Admitting: Cardiovascular Disease

## 2008-09-28 ENCOUNTER — Encounter: Payer: Self-pay | Admitting: Internal Medicine

## 2008-09-28 ENCOUNTER — Telehealth: Payer: Self-pay | Admitting: Internal Medicine

## 2008-09-28 LAB — CONVERTED CEMR LAB: POC INR: 3.2

## 2008-09-29 ENCOUNTER — Encounter: Payer: Self-pay | Admitting: Internal Medicine

## 2008-09-29 ENCOUNTER — Ambulatory Visit: Payer: Self-pay | Admitting: Internal Medicine

## 2008-09-29 DIAGNOSIS — R7989 Other specified abnormal findings of blood chemistry: Secondary | ICD-10-CM | POA: Insufficient documentation

## 2008-09-29 DIAGNOSIS — G479 Sleep disorder, unspecified: Secondary | ICD-10-CM | POA: Insufficient documentation

## 2008-09-29 LAB — CONVERTED CEMR LAB: INR: 3.3

## 2008-09-30 ENCOUNTER — Ambulatory Visit: Payer: Self-pay | Admitting: Internal Medicine

## 2008-09-30 ENCOUNTER — Ambulatory Visit (HOSPITAL_COMMUNITY): Admission: RE | Admit: 2008-09-30 | Discharge: 2008-09-30 | Payer: Self-pay | Admitting: Internal Medicine

## 2008-10-02 ENCOUNTER — Telehealth (INDEPENDENT_AMBULATORY_CARE_PROVIDER_SITE_OTHER): Payer: Self-pay | Admitting: *Deleted

## 2008-10-13 ENCOUNTER — Encounter: Payer: Self-pay | Admitting: Internal Medicine

## 2008-10-13 ENCOUNTER — Telehealth: Payer: Self-pay | Admitting: Internal Medicine

## 2008-10-16 ENCOUNTER — Ambulatory Visit: Payer: Self-pay | Admitting: Internal Medicine

## 2008-10-16 LAB — CONVERTED CEMR LAB: INR: 1.5

## 2008-10-23 ENCOUNTER — Encounter (INDEPENDENT_AMBULATORY_CARE_PROVIDER_SITE_OTHER): Payer: Self-pay | Admitting: *Deleted

## 2008-10-26 ENCOUNTER — Ambulatory Visit: Payer: Self-pay | Admitting: Internal Medicine

## 2008-10-31 ENCOUNTER — Encounter: Payer: Self-pay | Admitting: Internal Medicine

## 2008-11-02 LAB — CONVERTED CEMR LAB
BUN: 28 mg/dL — ABNORMAL HIGH (ref 6–23)
CO2: 21 meq/L (ref 19–32)
Calcium: 9 mg/dL (ref 8.4–10.5)
Chloride: 103 meq/L (ref 96–112)
Creatinine, Ser: 1.16 mg/dL (ref 0.40–1.50)
Glucose, Bld: 99 mg/dL (ref 70–99)
Potassium: 4.2 meq/L (ref 3.5–5.3)
Sodium: 139 meq/L (ref 135–145)

## 2008-11-05 ENCOUNTER — Telehealth: Payer: Self-pay | Admitting: Internal Medicine

## 2008-11-13 ENCOUNTER — Ambulatory Visit: Payer: Self-pay | Admitting: Internal Medicine

## 2008-11-13 LAB — CONVERTED CEMR LAB: INR: 1.7

## 2008-11-20 ENCOUNTER — Encounter: Payer: Self-pay | Admitting: Internal Medicine

## 2008-11-23 ENCOUNTER — Telehealth (INDEPENDENT_AMBULATORY_CARE_PROVIDER_SITE_OTHER): Payer: Self-pay | Admitting: *Deleted

## 2008-11-23 ENCOUNTER — Ambulatory Visit: Payer: Self-pay | Admitting: Internal Medicine

## 2008-11-23 LAB — CONVERTED CEMR LAB
INR: 2.3 — ABNORMAL HIGH (ref 0.8–1.0)
Prothrombin Time: 23.4 s — ABNORMAL HIGH (ref 9.1–11.7)

## 2008-11-24 ENCOUNTER — Telehealth (INDEPENDENT_AMBULATORY_CARE_PROVIDER_SITE_OTHER): Payer: Self-pay | Admitting: *Deleted

## 2008-11-24 ENCOUNTER — Encounter (INDEPENDENT_AMBULATORY_CARE_PROVIDER_SITE_OTHER): Payer: Self-pay | Admitting: *Deleted

## 2008-11-27 ENCOUNTER — Ambulatory Visit (HOSPITAL_COMMUNITY): Admission: RE | Admit: 2008-11-27 | Discharge: 2008-11-27 | Payer: Self-pay | Admitting: Internal Medicine

## 2008-11-27 ENCOUNTER — Encounter: Payer: Self-pay | Admitting: Internal Medicine

## 2008-11-27 ENCOUNTER — Ambulatory Visit: Payer: Self-pay | Admitting: Internal Medicine

## 2008-12-01 ENCOUNTER — Telehealth: Payer: Self-pay | Admitting: Internal Medicine

## 2008-12-02 ENCOUNTER — Encounter: Payer: Self-pay | Admitting: Internal Medicine

## 2008-12-21 ENCOUNTER — Ambulatory Visit: Payer: Self-pay | Admitting: Internal Medicine

## 2008-12-25 ENCOUNTER — Telehealth (INDEPENDENT_AMBULATORY_CARE_PROVIDER_SITE_OTHER): Payer: Self-pay | Admitting: *Deleted

## 2008-12-25 ENCOUNTER — Ambulatory Visit: Payer: Self-pay | Admitting: Internal Medicine

## 2008-12-26 LAB — CONVERTED CEMR LAB
INR: 2.5 — ABNORMAL HIGH (ref 0.8–1.0)
Prothrombin Time: 25.3 s — ABNORMAL HIGH (ref 9.1–11.7)

## 2009-01-19 ENCOUNTER — Observation Stay (HOSPITAL_COMMUNITY): Admission: EM | Admit: 2009-01-19 | Discharge: 2009-01-20 | Payer: Self-pay | Admitting: Emergency Medicine

## 2009-01-19 ENCOUNTER — Ambulatory Visit: Payer: Self-pay | Admitting: Cardiovascular Disease

## 2009-01-20 ENCOUNTER — Telehealth (INDEPENDENT_AMBULATORY_CARE_PROVIDER_SITE_OTHER): Payer: Self-pay | Admitting: *Deleted

## 2009-01-21 ENCOUNTER — Encounter (HOSPITAL_COMMUNITY): Admission: RE | Admit: 2009-01-21 | Discharge: 2009-03-22 | Payer: Self-pay | Admitting: Internal Medicine

## 2009-01-21 ENCOUNTER — Encounter: Payer: Self-pay | Admitting: Cardiovascular Disease

## 2009-01-21 ENCOUNTER — Ambulatory Visit: Payer: Self-pay

## 2009-01-21 ENCOUNTER — Encounter: Payer: Self-pay | Admitting: Internal Medicine

## 2009-01-21 ENCOUNTER — Ambulatory Visit: Payer: Self-pay | Admitting: Internal Medicine

## 2009-02-09 ENCOUNTER — Ambulatory Visit: Payer: Self-pay | Admitting: Internal Medicine

## 2009-02-09 DIAGNOSIS — G250 Essential tremor: Secondary | ICD-10-CM | POA: Insufficient documentation

## 2009-02-09 DIAGNOSIS — F411 Generalized anxiety disorder: Secondary | ICD-10-CM | POA: Insufficient documentation

## 2009-02-09 DIAGNOSIS — G252 Other specified forms of tremor: Secondary | ICD-10-CM

## 2009-02-09 LAB — CONVERTED CEMR LAB: INR: 1.9

## 2009-02-10 ENCOUNTER — Telehealth (INDEPENDENT_AMBULATORY_CARE_PROVIDER_SITE_OTHER): Payer: Self-pay | Admitting: *Deleted

## 2009-02-10 LAB — CONVERTED CEMR LAB
INR: 2.07 — ABNORMAL HIGH (ref <1.50)
Prothrombin Time: 23.1 s — ABNORMAL HIGH (ref 11.6–15.2)

## 2009-02-22 ENCOUNTER — Ambulatory Visit: Payer: Self-pay | Admitting: Internal Medicine

## 2009-02-25 ENCOUNTER — Telehealth (INDEPENDENT_AMBULATORY_CARE_PROVIDER_SITE_OTHER): Payer: Self-pay | Admitting: *Deleted

## 2009-02-25 LAB — CONVERTED CEMR LAB
Free T4: 0.9 ng/dL (ref 0.6–1.6)
Hgb A1c MFr Bld: 6.3 % (ref 4.6–6.5)
INR: 1.8 — ABNORMAL HIGH (ref 0.8–1.0)
Prothrombin Time: 18.3 s — ABNORMAL HIGH (ref 9.1–11.7)
T3, Free: 2.3 pg/mL (ref 2.3–4.2)
TSH: 5.49 microintl units/mL (ref 0.35–5.50)

## 2009-03-01 LAB — CONVERTED CEMR LAB
Metaneph Total, Ur: 746 ug/24hr (ref 224–832)
Metanephrines, Ur: 161 (ref 90–315)
Normetanephrine, 24H Ur: 585 (ref 122–676)

## 2009-03-05 ENCOUNTER — Ambulatory Visit: Payer: Self-pay | Admitting: Internal Medicine

## 2009-03-08 LAB — CONVERTED CEMR LAB
ALT: 22 units/L (ref 0–53)
AST: 24 units/L (ref 0–37)
Albumin: 4 g/dL (ref 3.5–5.2)
Alkaline Phosphatase: 56 units/L (ref 39–117)
Bilirubin, Direct: 0.1 mg/dL (ref 0.0–0.3)
Cholesterol: 175 mg/dL (ref 0–200)
HDL: 65.1 mg/dL (ref >39.00)
INR: 2.4 — ABNORMAL HIGH (ref 0.8–1.0)
LDL Cholesterol: 91 mg/dL (ref 0–99)
Prothrombin Time: 24.8 s — ABNORMAL HIGH (ref 9.1–11.7)
Total Bilirubin: 0.6 mg/dL (ref 0.3–1.2)
Total CHOL/HDL Ratio: 3
Total Protein: 7.4 g/dL (ref 6.0–8.3)
Triglycerides: 97 mg/dL (ref 0.0–149.0)
VLDL: 19.4 mg/dL (ref 0.0–40.0)

## 2009-04-02 ENCOUNTER — Encounter: Payer: Self-pay | Admitting: Internal Medicine

## 2009-04-05 ENCOUNTER — Ambulatory Visit: Payer: Self-pay | Admitting: Internal Medicine

## 2009-04-05 DIAGNOSIS — R609 Edema, unspecified: Secondary | ICD-10-CM | POA: Insufficient documentation

## 2009-04-05 DIAGNOSIS — I4891 Unspecified atrial fibrillation: Secondary | ICD-10-CM | POA: Insufficient documentation

## 2009-04-14 ENCOUNTER — Encounter: Payer: Self-pay | Admitting: Internal Medicine

## 2009-04-23 ENCOUNTER — Ambulatory Visit: Payer: Self-pay | Admitting: Internal Medicine

## 2009-04-23 LAB — CONVERTED CEMR LAB
INR: 2.64 — ABNORMAL HIGH (ref <1.50)
Prothrombin Time: 28 s — ABNORMAL HIGH (ref 11.6–15.2)

## 2009-04-27 ENCOUNTER — Telehealth (INDEPENDENT_AMBULATORY_CARE_PROVIDER_SITE_OTHER): Payer: Self-pay | Admitting: *Deleted

## 2009-05-03 ENCOUNTER — Telehealth: Payer: Self-pay | Admitting: Gastroenterology

## 2009-05-03 ENCOUNTER — Encounter (INDEPENDENT_AMBULATORY_CARE_PROVIDER_SITE_OTHER): Payer: Self-pay | Admitting: *Deleted

## 2009-05-28 ENCOUNTER — Ambulatory Visit: Payer: Self-pay | Admitting: Internal Medicine

## 2009-05-28 LAB — CONVERTED CEMR LAB
INR: 2.6 — ABNORMAL HIGH (ref <1.50)
Prothrombin Time: 27.6 s — ABNORMAL HIGH (ref 11.6–15.2)

## 2009-06-01 ENCOUNTER — Telehealth (INDEPENDENT_AMBULATORY_CARE_PROVIDER_SITE_OTHER): Payer: Self-pay | Admitting: *Deleted

## 2009-06-24 ENCOUNTER — Telehealth: Payer: Self-pay | Admitting: Internal Medicine

## 2009-07-01 ENCOUNTER — Ambulatory Visit (HOSPITAL_COMMUNITY): Admission: RE | Admit: 2009-07-01 | Discharge: 2009-07-01 | Payer: Self-pay | Admitting: Cardiovascular Disease

## 2009-07-01 ENCOUNTER — Ambulatory Visit: Payer: Self-pay | Admitting: Cardiovascular Disease

## 2009-08-06 ENCOUNTER — Telehealth: Payer: Self-pay | Admitting: Internal Medicine

## 2009-08-09 ENCOUNTER — Encounter: Payer: Self-pay | Admitting: Internal Medicine

## 2009-08-12 ENCOUNTER — Telehealth: Payer: Self-pay | Admitting: Internal Medicine

## 2009-08-17 ENCOUNTER — Telehealth (INDEPENDENT_AMBULATORY_CARE_PROVIDER_SITE_OTHER): Payer: Self-pay | Admitting: *Deleted

## 2009-08-18 ENCOUNTER — Ambulatory Visit: Payer: Self-pay | Admitting: Internal Medicine

## 2009-08-18 DIAGNOSIS — M7660 Achilles tendinitis, unspecified leg: Secondary | ICD-10-CM | POA: Insufficient documentation

## 2009-08-18 DIAGNOSIS — R635 Abnormal weight gain: Secondary | ICD-10-CM | POA: Insufficient documentation

## 2009-08-18 LAB — CONVERTED CEMR LAB: INR: 3.2

## 2009-08-19 LAB — CONVERTED CEMR LAB
BUN: 27 mg/dL — ABNORMAL HIGH (ref 6–23)
Creatinine, Ser: 1.3 mg/dL (ref 0.4–1.5)
Hgb A1c MFr Bld: 6.2 % (ref 4.6–6.5)
TSH: 3.99 microintl units/mL (ref 0.35–5.50)

## 2009-09-06 ENCOUNTER — Telehealth (INDEPENDENT_AMBULATORY_CARE_PROVIDER_SITE_OTHER): Payer: Self-pay | Admitting: *Deleted

## 2009-09-08 ENCOUNTER — Ambulatory Visit: Payer: Self-pay | Admitting: Internal Medicine

## 2009-09-30 ENCOUNTER — Telehealth: Payer: Self-pay | Admitting: Internal Medicine

## 2009-10-04 ENCOUNTER — Telehealth: Payer: Self-pay | Admitting: Internal Medicine

## 2009-10-04 ENCOUNTER — Telehealth (INDEPENDENT_AMBULATORY_CARE_PROVIDER_SITE_OTHER): Payer: Self-pay | Admitting: *Deleted

## 2009-10-04 ENCOUNTER — Ambulatory Visit: Payer: Self-pay | Admitting: Internal Medicine

## 2009-10-04 LAB — CONVERTED CEMR LAB
INR: 3.6 — ABNORMAL HIGH (ref 0.8–1.0)
Prothrombin Time: 38 s — ABNORMAL HIGH (ref 9.7–11.8)

## 2009-10-11 ENCOUNTER — Telehealth (INDEPENDENT_AMBULATORY_CARE_PROVIDER_SITE_OTHER): Payer: Self-pay | Admitting: *Deleted

## 2009-10-11 ENCOUNTER — Ambulatory Visit: Payer: Self-pay | Admitting: Internal Medicine

## 2009-10-11 ENCOUNTER — Encounter: Payer: Self-pay | Admitting: Internal Medicine

## 2009-10-11 LAB — CONVERTED CEMR LAB
INR: 2.3 — ABNORMAL HIGH (ref 0.8–1.0)
Prothrombin Time: 24.5 s — ABNORMAL HIGH (ref 9.7–11.8)

## 2009-10-13 ENCOUNTER — Encounter: Payer: Self-pay | Admitting: Internal Medicine

## 2009-10-15 ENCOUNTER — Telehealth: Payer: Self-pay | Admitting: Internal Medicine

## 2009-10-15 ENCOUNTER — Encounter: Payer: Self-pay | Admitting: Internal Medicine

## 2009-10-27 ENCOUNTER — Ambulatory Visit (HOSPITAL_COMMUNITY): Admission: RE | Admit: 2009-10-27 | Discharge: 2009-10-27 | Payer: Self-pay | Admitting: Internal Medicine

## 2009-10-27 ENCOUNTER — Ambulatory Visit: Payer: Self-pay | Admitting: Internal Medicine

## 2009-10-27 ENCOUNTER — Ambulatory Visit: Payer: Self-pay | Admitting: Cardiovascular Disease

## 2009-10-29 ENCOUNTER — Telehealth (INDEPENDENT_AMBULATORY_CARE_PROVIDER_SITE_OTHER): Payer: Self-pay | Admitting: *Deleted

## 2009-10-29 LAB — CONVERTED CEMR LAB
BUN: 25 mg/dL — ABNORMAL HIGH (ref 6–23)
Basophils Absolute: 0.1 10*3/uL (ref 0.0–0.1)
Basophils Relative: 0.8 % (ref 0.0–3.0)
CO2: 32 meq/L (ref 19–32)
Calcium: 9.2 mg/dL (ref 8.4–10.5)
Chloride: 100 meq/L (ref 96–112)
Creatinine, Ser: 1.3 mg/dL (ref 0.4–1.5)
Eosinophils Absolute: 0.3 10*3/uL (ref 0.0–0.7)
Eosinophils Relative: 3.8 % (ref 0.0–5.0)
GFR calc non Af Amer: 61.43 mL/min (ref >60)
Glucose, Bld: 162 mg/dL — ABNORMAL HIGH (ref 70–99)
HCT: 43.5 % (ref 39.0–52.0)
Hemoglobin: 15.1 g/dL (ref 13.0–17.0)
Hgb A1c MFr Bld: 7.1 % — ABNORMAL HIGH (ref 4.6–6.5)
INR: 3 — ABNORMAL HIGH (ref 0.8–1.0)
Lymphocytes Relative: 14.4 % (ref 12.0–46.0)
Lymphs Abs: 1.3 10*3/uL (ref 0.7–4.0)
MCHC: 34.6 g/dL (ref 30.0–36.0)
MCV: 91.2 fL (ref 78.0–100.0)
Monocytes Absolute: 0.7 10*3/uL (ref 0.1–1.0)
Monocytes Relative: 8.1 % (ref 3.0–12.0)
Neutro Abs: 6.5 10*3/uL (ref 1.4–7.7)
Neutrophils Relative %: 72.9 % (ref 43.0–77.0)
Platelets: 199 10*3/uL (ref 150.0–400.0)
Potassium: 4.4 meq/L (ref 3.5–5.1)
Prothrombin Time: 32.2 s — ABNORMAL HIGH (ref 9.7–11.8)
RBC: 4.77 M/uL (ref 4.22–5.81)
RDW: 14.6 % (ref 11.5–14.6)
Sodium: 139 meq/L (ref 135–145)
WBC: 8.9 10*3/uL (ref 4.5–10.5)
aPTT: 45.8 s — ABNORMAL HIGH (ref 21.7–28.8)

## 2009-11-01 ENCOUNTER — Ambulatory Visit (HOSPITAL_COMMUNITY): Admission: RE | Admit: 2009-11-01 | Discharge: 2009-11-01 | Payer: Self-pay | Admitting: Internal Medicine

## 2009-11-01 ENCOUNTER — Ambulatory Visit: Payer: Self-pay | Admitting: Internal Medicine

## 2009-11-01 ENCOUNTER — Encounter: Payer: Self-pay | Admitting: Internal Medicine

## 2009-11-02 ENCOUNTER — Ambulatory Visit (HOSPITAL_COMMUNITY): Admission: RE | Admit: 2009-11-02 | Discharge: 2009-11-03 | Payer: Self-pay | Admitting: Internal Medicine

## 2009-11-02 ENCOUNTER — Ambulatory Visit: Payer: Self-pay | Admitting: Internal Medicine

## 2009-11-04 ENCOUNTER — Telehealth: Payer: Self-pay | Admitting: Internal Medicine

## 2009-11-05 ENCOUNTER — Telehealth: Payer: Self-pay | Admitting: Internal Medicine

## 2009-11-10 ENCOUNTER — Telehealth: Payer: Self-pay | Admitting: Internal Medicine

## 2009-11-10 ENCOUNTER — Encounter: Payer: Self-pay | Admitting: Internal Medicine

## 2009-11-25 ENCOUNTER — Ambulatory Visit: Payer: Self-pay | Admitting: Internal Medicine

## 2009-11-29 ENCOUNTER — Ambulatory Visit: Payer: Self-pay | Admitting: Cardiology

## 2009-11-29 ENCOUNTER — Ambulatory Visit (HOSPITAL_COMMUNITY): Admission: RE | Admit: 2009-11-29 | Discharge: 2009-11-29 | Payer: Self-pay | Admitting: Cardiology

## 2009-12-02 ENCOUNTER — Telehealth: Payer: Self-pay | Admitting: Internal Medicine

## 2009-12-07 ENCOUNTER — Telehealth: Payer: Self-pay | Admitting: Internal Medicine

## 2009-12-14 ENCOUNTER — Telehealth: Payer: Self-pay | Admitting: Internal Medicine

## 2009-12-16 ENCOUNTER — Ambulatory Visit: Payer: Self-pay | Admitting: Internal Medicine

## 2009-12-16 DIAGNOSIS — J069 Acute upper respiratory infection, unspecified: Secondary | ICD-10-CM | POA: Insufficient documentation

## 2009-12-16 LAB — CONVERTED CEMR LAB: INR: 2.6

## 2009-12-17 ENCOUNTER — Telehealth: Payer: Self-pay | Admitting: Internal Medicine

## 2009-12-29 ENCOUNTER — Ambulatory Visit: Payer: Self-pay | Admitting: Internal Medicine

## 2009-12-29 LAB — CONVERTED CEMR LAB
BUN: 30 mg/dL — ABNORMAL HIGH (ref 6–23)
Basophils Absolute: 0 10*3/uL (ref 0.0–0.1)
Basophils Relative: 0.6 % (ref 0.0–3.0)
CO2: 29 meq/L (ref 19–32)
Calcium: 9.6 mg/dL (ref 8.4–10.5)
Chloride: 103 meq/L (ref 96–112)
Creatinine, Ser: 1.2 mg/dL (ref 0.4–1.5)
Eosinophils Absolute: 0.3 10*3/uL (ref 0.0–0.7)
Eosinophils Relative: 3.7 % (ref 0.0–5.0)
GFR calc non Af Amer: 63.72 mL/min (ref >60.00)
Glucose, Bld: 73 mg/dL (ref 70–99)
HCT: 44.4 % (ref 39.0–52.0)
Hemoglobin: 14.9 g/dL (ref 13.0–17.0)
INR: 2.6 — ABNORMAL HIGH (ref 0.8–1.0)
Lymphocytes Relative: 14.4 % (ref 12.0–46.0)
Lymphs Abs: 1.2 10*3/uL (ref 0.7–4.0)
MCHC: 33.7 g/dL (ref 30.0–36.0)
MCV: 91.8 fL (ref 78.0–100.0)
Magnesium: 2.2 mg/dL (ref 1.5–2.5)
Monocytes Absolute: 0.7 10*3/uL (ref 0.1–1.0)
Monocytes Relative: 9 % (ref 3.0–12.0)
Neutro Abs: 5.9 10*3/uL (ref 1.4–7.7)
Neutrophils Relative %: 72.3 % (ref 43.0–77.0)
Platelets: 202 10*3/uL (ref 150.0–400.0)
Potassium: 4.7 meq/L (ref 3.5–5.1)
Prothrombin Time: 27.5 s — ABNORMAL HIGH (ref 9.7–11.8)
RBC: 4.83 M/uL (ref 4.22–5.81)
RDW: 15 % — ABNORMAL HIGH (ref 11.5–14.6)
Sodium: 139 meq/L (ref 135–145)
WBC: 8.2 10*3/uL (ref 4.5–10.5)
aPTT: 40.5 s — ABNORMAL HIGH (ref 21.7–28.8)

## 2009-12-30 ENCOUNTER — Telehealth: Payer: Self-pay | Admitting: Internal Medicine

## 2010-01-03 ENCOUNTER — Ambulatory Visit (HOSPITAL_COMMUNITY)
Admission: RE | Admit: 2010-01-03 | Discharge: 2010-01-03 | Payer: Self-pay | Source: Home / Self Care | Attending: Cardiology | Admitting: Cardiology

## 2010-01-04 ENCOUNTER — Telehealth: Payer: Self-pay | Admitting: Internal Medicine

## 2010-01-05 ENCOUNTER — Ambulatory Visit: Payer: Self-pay | Admitting: Internal Medicine

## 2010-01-07 LAB — CONVERTED CEMR LAB
Creatinine,U: 265.1 mg/dL
Hgb A1c MFr Bld: 6.7 % — ABNORMAL HIGH (ref 4.6–6.5)
Microalb Creat Ratio: 1.2 mg/g (ref 0.0–30.0)
Microalb, Ur: 3.3 mg/dL — ABNORMAL HIGH (ref 0.0–1.9)

## 2010-01-20 ENCOUNTER — Telehealth: Payer: Self-pay | Admitting: Internal Medicine

## 2010-01-28 ENCOUNTER — Telehealth (INDEPENDENT_AMBULATORY_CARE_PROVIDER_SITE_OTHER): Payer: Self-pay | Admitting: *Deleted

## 2010-02-02 ENCOUNTER — Ambulatory Visit
Admission: RE | Admit: 2010-02-02 | Discharge: 2010-02-02 | Payer: Self-pay | Source: Home / Self Care | Attending: Internal Medicine | Admitting: Internal Medicine

## 2010-02-02 ENCOUNTER — Encounter: Payer: Self-pay | Admitting: Internal Medicine

## 2010-02-03 ENCOUNTER — Encounter: Payer: Self-pay | Admitting: Internal Medicine

## 2010-02-09 ENCOUNTER — Ambulatory Visit (HOSPITAL_COMMUNITY)
Admission: RE | Admit: 2010-02-09 | Discharge: 2010-02-09 | Payer: Self-pay | Source: Home / Self Care | Attending: Internal Medicine | Admitting: Internal Medicine

## 2010-02-09 LAB — CBC
HCT: 48.2 % (ref 39.0–52.0)
Hemoglobin: 15.1 g/dL (ref 13.0–17.0)
MCH: 28.7 pg (ref 26.0–34.0)
MCHC: 31.3 g/dL (ref 30.0–36.0)
MCV: 91.5 fL (ref 78.0–100.0)
Platelets: 190 10*3/uL (ref 150–400)
RBC: 5.27 MIL/uL (ref 4.22–5.81)
RDW: 15 % (ref 11.5–15.5)
WBC: 5.8 10*3/uL (ref 4.0–10.5)

## 2010-02-09 LAB — PROTIME-INR
INR: 3.21 — ABNORMAL HIGH (ref 0.00–1.49)
Prothrombin Time: 32.9 seconds — ABNORMAL HIGH (ref 11.6–15.2)

## 2010-02-09 LAB — BASIC METABOLIC PANEL
BUN: 26 mg/dL — ABNORMAL HIGH (ref 6–23)
CO2: 26 mEq/L (ref 19–32)
Calcium: 9.9 mg/dL (ref 8.4–10.5)
Chloride: 104 mEq/L (ref 96–112)
Creatinine, Ser: 1.34 mg/dL (ref 0.4–1.5)
GFR calc Af Amer: >60 mL/min (ref >60)
GFR calc non Af Amer: 54 mL/min — ABNORMAL LOW (ref >60)
Glucose, Bld: 123 mg/dL — ABNORMAL HIGH (ref 70–99)
Potassium: 4.4 mEq/L (ref 3.5–5.1)
Sodium: 142 mEq/L (ref 135–145)

## 2010-02-09 LAB — MAGNESIUM: Magnesium: 2.2 mg/dL (ref 1.5–2.5)

## 2010-02-09 LAB — APTT: aPTT: 57 seconds — ABNORMAL HIGH (ref 24–37)

## 2010-02-11 ENCOUNTER — Telehealth: Payer: Self-pay | Admitting: Internal Medicine

## 2010-02-13 LAB — CONVERTED CEMR LAB
ALT: 18 units/L (ref 0–53)
AST: 21 units/L (ref 0–37)
Albumin: 4.3 g/dL (ref 3.5–5.2)
Alkaline Phosphatase: 28 units/L — ABNORMAL LOW (ref 39–117)
Anti Nuclear Antibody(ANA): NEGATIVE
BUN: 26 mg/dL — ABNORMAL HIGH (ref 6–23)
BUN: 31 mg/dL — ABNORMAL HIGH (ref 6–23)
Basophils Absolute: 0.1 10*3/uL (ref 0.0–0.1)
Basophils Absolute: 0.1 10*3/uL (ref 0.0–0.1)
Basophils Relative: 1 % (ref 0–1)
Basophils Relative: 1.1 % (ref 0.0–3.0)
Bilirubin Urine: NEGATIVE
Bilirubin, Direct: 0.1 mg/dL (ref 0.0–0.3)
C3 Complement: 149 mg/dL (ref 88–201)
CO2: 25 meq/L (ref 19–32)
CRP: 0.3 mg/dL (ref <0.6)
Calcium: 9.8 mg/dL (ref 8.4–10.5)
Chloride: 106 meq/L (ref 96–112)
Cholesterol: 139 mg/dL (ref 0–200)
Complement C4, Body Fluid: 19 mg/dL (ref 16–47)
Creatinine, Ser: 1.01 mg/dL (ref 0.40–1.50)
Creatinine, Ser: 1.1 mg/dL (ref 0.4–1.5)
Creatinine, Ser: 1.3 mg/dL (ref 0.4–1.5)
Creatinine,U: 154.6 mg/dL
ENA SM Ab Ser-aCnc: <0.2 (ref <1.0)
Eosinophils Absolute: 0.3 10*3/uL (ref 0.0–0.7)
Eosinophils Absolute: 0.4 10*3/uL (ref 0.0–0.7)
Eosinophils Relative: 4 % (ref 0.0–5.0)
Eosinophils Relative: 6 % — ABNORMAL HIGH (ref 0–5)
Free T4: 1.1 ng/dL (ref 0.6–1.6)
GFR calc non Af Amer: 74.16 mL/min (ref >60)
Glucose, Bld: 72 mg/dL (ref 70–99)
HCT: 44.2 % (ref 39.0–52.0)
HCT: 45.4 % (ref 39.0–52.0)
HDL: 50.8 mg/dL (ref >39.0)
Hemoglobin, Urine: NEGATIVE
Hemoglobin: 14.3 g/dL (ref 13.0–17.0)
Hemoglobin: 15.5 g/dL (ref 13.0–17.0)
Hgb A1c MFr Bld: 5.8 % (ref 4.6–6.0)
INR: 2.3 — ABNORMAL HIGH (ref 0.8–1.0)
INR: 2.7 — ABNORMAL HIGH (ref 0.8–1.0)
INR: 2.9 — ABNORMAL HIGH (ref 0.8–1.0)
Ketones, ur: NEGATIVE mg/dL
LDL Cholesterol: 77 mg/dL (ref 0–99)
Leukocytes, UA: NEGATIVE
Lymphocytes Relative: 16.9 % (ref 12.0–46.0)
Lymphocytes Relative: 21 % (ref 12–46)
Lymphs Abs: 1.4 10*3/uL (ref 0.7–4.0)
Lymphs Abs: 1.5 10*3/uL (ref 0.7–4.0)
MCHC: 32.4 g/dL (ref 30.0–36.0)
MCHC: 34.2 g/dL (ref 30.0–36.0)
MCV: 91.4 fL (ref 78.0–100.0)
MCV: 92.3 fL (ref 78.0–100.0)
Microalb Creat Ratio: 7.1 mg/g (ref 0.0–30.0)
Microalb, Ur: 1.1 mg/dL (ref 0.0–1.9)
Monocytes Absolute: 0.8 10*3/uL (ref 0.1–1.0)
Monocytes Absolute: 0.8 10*3/uL (ref 0.1–1.0)
Monocytes Relative: 11 % (ref 3–12)
Monocytes Relative: 9.9 % (ref 3.0–12.0)
Neutro Abs: 4.1 10*3/uL (ref 1.7–7.7)
Neutro Abs: 5.5 10*3/uL (ref 1.4–7.7)
Neutrophils Relative %: 61 % (ref 43–77)
Neutrophils Relative %: 68.1 % (ref 43.0–77.0)
Nitrite: NEGATIVE
Platelets: 198 10*3/uL (ref 150.0–400.0)
Platelets: 239 10*3/uL (ref 150–400)
Potassium: 4.2 meq/L (ref 3.5–5.1)
Potassium: 4.3 meq/L (ref 3.5–5.1)
Protein, ur: NEGATIVE mg/dL
Prothrombin Time: 23.9 s — ABNORMAL HIGH (ref 9.1–11.7)
Prothrombin Time: 28.9 s — ABNORMAL HIGH (ref 9.7–11.8)
Prothrombin Time: 30.3 s — ABNORMAL HIGH (ref 10.9–13.3)
RBC: 4.79 M/uL (ref 4.22–5.81)
RBC: 4.97 M/uL (ref 4.22–5.81)
RDW: 14.5 % (ref 11.5–14.6)
RDW: 16.5 % — ABNORMAL HIGH (ref 11.5–15.5)
Sed Rate: 18 mm/hr — ABNORMAL HIGH (ref 0–16)
Sodium: 142 meq/L (ref 135–145)
Specific Gravity, Urine: 1.02 (ref 1.005–1.030)
TSH: 2.47 microintl units/mL (ref 0.35–5.50)
Total Bilirubin: 0.7 mg/dL (ref 0.3–1.2)
Total CHOL/HDL Ratio: 2.7
Total Protein: 7.7 g/dL (ref 6.0–8.3)
Triglycerides: 57 mg/dL (ref 0–149)
Urine Glucose: NEGATIVE mg/dL
Urobilinogen, UA: 0.2 (ref 0.0–1.0)
VLDL: 11 mg/dL (ref 0–40)
WBC: 6.8 10*3/uL (ref 4.0–10.5)
WBC: 8.1 10*3/uL (ref 4.5–10.5)
aPTT: 42.3 s — ABNORMAL HIGH (ref 21.7–28.8)
ds DNA Ab: <1 (ref <5)
pH: 6.5 (ref 5.0–8.0)

## 2010-02-17 NOTE — Progress Notes (Signed)
Summary: talk to nurse  Phone Note Call from Patient Call back at Home Phone [phone]   Caller: Patient Summary of Call: pt would like to update you on a cardioversion. Initial call taken by: Edman Circle,  December 01, 2008 11:15 AM  Follow-up for Phone Call        spoke with patient still in rhythm  feels pretty good.  Having SOB doesnt have any strength in legs, and feels dizzy on Flecanaide 150mg  qam and 75mg  q pm and Atenolol 50mg  qam.  tightness in chest at night.   Dennis Bast, RN, BSN  December 01, 2008 1:38 PM called pt after speaking w Dr Johney Frame was going to get an EKG  he was seeing the company N=nurse for some congestion and fluid in his ears.  She is going to do an EKG and fax it to [phone] Dennis Bast, RN, BSN  December 02, 2008 10:38 AM  Additional Follow-up for Phone Call Additional follow up Details #1::        Hold off on Atenolol for now and call back with progress Dennis Bast, RN, BSN  December 02, 2008 12:41 PM

## 2010-02-17 NOTE — Assessment & Plan Note (Signed)
Summary: pt/cbs  Nurse Visit   Vital Signs:  Patient Profile:   65 Years Old Male CC:      PT CHECK Weight:      222 pounds Pulse rate:   72 / minute BP sitting:   112 / 70  (left arm) Cuff size:   large  Vitals Entered By: Shonna Chock (January 24, 2008 4:15 PM)                 Impression & Recommendations:  Problem # 1:  ENCOUNTER FOR THERAPEUTIC DRUG MONITORING (ICD-V58.83)  Orders: Protime (16109UE)   Problem # 2:  PAROXYSMAL ATRIAL FIBRILLATION (ICD-427.31)  His updated medication list for this problem includes:    Atenolol 50 Mg Tabs (Atenolol) .Marland Kitchen... 1 by mouth qd    Flecainide Acetate 100 Mg Tabs (Flecainide acetate) .Marland Kitchen... 1 and 1/2 tab qam and 1 tab qpm    Coumadin 5 Mg Tabs (Warfarin sodium) .Marland Kitchen... Take as directed  Orders: Protime ([id number])   Complete Medication List: 1)  Actos 45 Mg Tabs (Pioglitazone hcl) .Marland Kitchen.. 1 by mouth qd 2)  Metformin Hcl 1000 Mg Tabs (Metformin hcl) .Marland Kitchen.. 1bid 3)  Tricor 145 Mg Tabs (Fenofibrate) .... Take one tablet daily 4)  Glimepiride 2 Mg Tabs (Glimepiride) .... 1/2 q am 5)  Allergy Shots  6)  Hydrochlorothiazide 25 Mg Tabs (Hydrochlorothiazide) .Marland Kitchen.. 1 by mouth qd 7)  Atenolol 50 Mg Tabs (Atenolol) .Marland Kitchen.. 1 by mouth qd 8)  Flecainide Acetate 100 Mg Tabs (Flecainide acetate) .Marland Kitchen.. 1 and 1/2 tab qam and 1 tab qpm 9)  Quinapril Hcl 40 Mg Tabs (Quinapril hcl) .Marland Kitchen.. 1 by mouth qd 10)  Coumadin 5 Mg Tabs (Warfarin sodium) .... Take as directed 11)  Onetouch Ultra Test Strp (Glucose blood) .... Test two times a day   Prior Medications: ACTOS 45 MG  TABS (PIOGLITAZONE HCL) 1 by mouth qd METFORMIN HCL 1000 MG  TABS (METFORMIN HCL) 1bid TRICOR 145 MG  TABS (FENOFIBRATE) take one tablet daily GLIMEPIRIDE 2 MG  TABS (GLIMEPIRIDE) 1/2 q am ALLERGY SHOTS ()  HYDROCHLOROTHIAZIDE 25 MG  TABS (HYDROCHLOROTHIAZIDE) 1 by mouth qd ATENOLOL 50 MG  TABS (ATENOLOL) 1 by mouth qd FLECAINIDE ACETATE 100 MG  TABS (FLECAINIDE ACETATE) 1 and 1/2 tab  qam and 1 tab qpm QUINAPRIL HCL 40 MG  TABS (QUINAPRIL HCL) 1 by mouth qd COUMADIN 5 MG  TABS (WARFARIN SODIUM) take as directed ONETOUCH ULTRA TEST  STRP (GLUCOSE BLOOD) test two times a day Current Allergies: No known allergies  Laboratory Results   Blood Tests      INR: 2.3   (Normal Range: 0.88-1.12   Therap INR: 2.0-3.5)      Orders Added: 1)  Est. Patient Level I [19147] 2)  Protime [[id number]]     Patient Instructions: 1)  Data reviewed; Kathlene November was called & told to take 5 mg M,W,F and 2.5 mg all other days. PT/INR in 3 weeks. He questioned calibration date on machine ("08/26/07"). I informed him I'd F/U.  ]  ANTICOAGULATION RECORD PREVIOUS REGIMEN & LAB RESULTS   Previous INR:  3.3 on  12/09/2007 Previous Coumadin Dose(mg):  5MG -M,TUES,WED-5MG . ALL OTHER DAYS 2.5MG  on  12/09/2007 Previous Regimen:  2.5MG  TODAY, 2.5MG  EVERYDAY EXCEPT 5 MG TUES/SAT on  12/09/2007  NEW REGIMEN & LAB RESULTS Current INR: 2.3 Current Coumadin Dose(mg): T,SAT-5MG , 2.5MG  ALL OTHER DAYS Regimen: 2.5MG  T,TH,SAT-5MG  ALL OTHER DAYS  Provider: Hidaya Daniel      Repeat testing in: 3 WEEKS  MEDICATIONS ACTOS 45 MG  TABS (PIOGLITAZONE HCL) 1 by mouth qd METFORMIN HCL 1000 MG  TABS (METFORMIN HCL) 1bid TRICOR 145 MG  TABS (FENOFIBRATE) take one tablet daily GLIMEPIRIDE 2 MG  TABS (GLIMEPIRIDE) 1/2 q am * ALLERGY SHOTS  HYDROCHLOROTHIAZIDE 25 MG  TABS (HYDROCHLOROTHIAZIDE) 1 by mouth qd ATENOLOL 50 MG  TABS (ATENOLOL) 1 by mouth qd FLECAINIDE ACETATE 100 MG  TABS (FLECAINIDE ACETATE) 1 and 1/2 tab qam and 1 tab qpm QUINAPRIL HCL 40 MG  TABS (QUINAPRIL HCL) 1 by mouth qd COUMADIN 5 MG  TABS (WARFARIN SODIUM) take as directed ONETOUCH ULTRA TEST  STRP (GLUCOSE BLOOD) test two times a day   Anticoagulation Visit Questionnaire      Coumadin dose missed/changed:  No      Abnormal Bleeding Symptoms:  No   Any diet changes including alcohol intake, vegetables or greens since the last  visit:  No Any illnesses or hospitalizations since the last visit:  No Any signs of clotting since the last visit (including chest discomfort, dizziness, shortness of breath, arm tingling, slurred speech, swelling or redness in leg):  No

## 2010-02-17 NOTE — Progress Notes (Signed)
Summary: pt have questions about going to hospital  Phone Note Call from Patient Call back at Home Phone [phone]   Caller: Patient Summary of Call: pt have question about going in to the hospital next week Initial call taken by: Judie Grieve,  August 18, 2008 12:45 PM  Follow-up for Phone Call        coming tomorrow for labs will give instructions then  Follow-up by: Dennis Bast, RN, BSN,  August 18, 2008 2:57 PM

## 2010-02-17 NOTE — Progress Notes (Signed)
Summary: Nuclear Pre-Procedure  Phone Note Outgoing Call   Call placed by: Milana Na, EMT-P,  January 20, 2009 3:54 PM Summary of Call: Left message with information on Myoview Information Sheet (see scanned document for details).      Nuclear Med Background Indications for Stress Test: Evaluation for Ischemia, Post Hospital  Indications Comments: 01/20/08 thru 01/  /10 CP R/O MI  History: Ablation, Cardioversion, GXT, Heart Catheterization, Myocardial Perfusion Study  History Comments: '02 Cardioversion  '05 Heart Cath  N/O CAD '07 MPS 09/10 Ablation 11/10 GXT (-)    Symptoms: Chest Pain, Chest Tightness, Diaphoresis, Palpitations    Nuclear Pre-Procedure Cardiac Risk Factors: History of Smoking, Hypertension, Lipids, NIDDM, RBBB Height (in): 69  Nuclear Med Study Referring MD:  Chrissie Noa Hopper/ J.Allred

## 2010-02-17 NOTE — Assessment & Plan Note (Signed)
Summary: eph/jml   Visit Type:  Follow-up Referring Provider:  Berton Mount, MD Primary Provider:  [name] MD  CC:  afib.  History of Present Illness: Mr [name] is a pleasant 65 yo WM with a history of persistent afib, HTN, and DM who presents for EP follow-up.  He recently had cardioversion, but maintained sinus for less than 36 hours.  He reports some improvement in SOB in sinus.  He has stopped Amiodarone due to persistent SOB.  He reports significant fatigue and SOB which have been present for more than a year.  He does not feel that these symptoms have worsened since his ablation.  He denies chest pain and is otherwise without complaint today.  Current Medications (verified): 1)  Actos 45 Mg  Tabs (Pioglitazone Hcl) .Marland Kitchen.. 1 By Mouth Qd 2)  Glimepiride 2 Mg  Tabs (Glimepiride) .... 1/2 Q Am 3)  Allergy Shots 4)  Quinapril Hcl 40 Mg  Tabs (Quinapril Hcl) .Marland Kitchen.. 1 By Mouth Qd 5)  Coumadin 1 Mg Tabs (Warfarin Sodium) .... Take 2 Tablets By Mouth Once Daily As Directed 6)  Onetouch Ultra Test  Strp (Glucose Blood) .... Test Two Times A Day 7)  Amiodarone Hcl 200 Mg Tabs (Amiodarone Hcl) .Marland Kitchen.. 1 By Mouth Once Daily 8)  Zolpidem Tartrate 5 Mg Tabs (Zolpidem Tartrate) .... Take 1 Tab Q 3rd Night As Needed 9)  Atenolol 50 Mg Tabs (Atenolol) .... Take One Tablet By Mouth Daily 10)  Multivitamins  Caps (Multiple Vitamin) .... Take 2 Daily  Allergies: 1)  ! * No Ivp Dye Allergy 2)  ! * No Latex Allergy 3)  ! * No Shellfish Allergy 4)  ! Clonazepam (Clonazepam)  Past History:  Past Medical History: renal calculus OP 2005 Diabetes mellitus, type II Hyperlipidemia Hypertension Persistent atrial fibrillation s/p ablation 09/22/08  Past Surgical History: THR 7/08  Dr Charlann Boxer; cath 2005 non obstructive CAD Afib ablation 09/22/08  Social History: Reviewed history from 07/31/2008 and no changes required. Pt lives in [place].  He works at [employer].  He previously traveled  frequently with his job, but less recently.  Tob- quit 1976.  ETOH- none,  Drugs- none  Review of Systems       All systems are reviewed and negative except as listed in the HPI.   Vital Signs:  Patient profile:   65 year old male Height:      69 inches Weight:      225 pounds BMI:     33.35 Pulse rate:   82 / minute BP sitting:   158 / 80  (left arm) Cuff size:   regular  Vitals Entered By: Stanton Kidney, EMT-P (October 26, 2008 10:16 AM)  Physical Exam  General:  in no acute distress; alert,appropriate and cooperative throughout examination Head:  normocephalic and atraumatic Eyes:  PERRLA/EOM intact; conjunctiva and lids normal. Nose:  no deformity, discharge, inflammation, or lesions Mouth:  Teeth, gums and palate normal. Oral mucosa normal. Neck:  Neck supple, no JVD. No masses, thyromegaly or abnormal cervical nodes. Lungs:  Clear bilaterally to auscultation and percussion. Heart:  irregularly irregular, no m/r/g Abdomen:  Bowel sounds positive; abdomen soft and non-tender without masses, organomegaly, or hernias noted. No hepatosplenomegaly. Msk:  Back normal, normal gait. Muscle strength and tone normal. Pulses:  pulses normal in all 4 extremities Extremities:  No clubbing or cyanosis. Neurologic:  strength/sensation intact Skin:  Intact without lesions or rashes. Cervical Nodes:  no significant adenopathy Psych:  Normal affect.   EKG  Procedure date:  10/26/2008  Findings:      afib, V rate 82,  otherwise normal ekg  Impression & Recommendations:  Problem # 1:  ATRIAL FIBRILLATION, CHRONIC (ICD-427.31) The patient continues to have afib s/p recent ablation.  His very large left atrial size does not bode well for our ability to maintain sinus longterm.  He remains in the early healing phase following ablation, this may be only ERAF.  I would therefore like to allow his atrium to heal for 4-6 more weeks and then repeat cardioversion.  The importance of weekly INRs  in the interim  (goal INR 2-3) were discussed with the patient.  He will continue to have his INRs followed through Dr [name]. As he has stopped amiodarone, I will start flecainide today.  He will need GXT myoview in the next few weeks.  Problem # 2:  HYPERTENSION (ICD-401.9) above goal today.  We will consider restarting HCTZ, though I would like to see the patient's creatinine first (previously with prerenal azotemia).  His updated medication list for this problem includes:    Quinapril Hcl 40 Mg Tabs (Quinapril hcl) .Marland Kitchen... 1 by mouth qd    Atenolol 50 Mg Tabs (Atenolol) .Marland Kitchen... Take one tablet by mouth daily  Problem # 3:  RENAL INSUFFICIENCY (ICD-588.9) Previously improved with hydration. Pt feels "bloated" and requests to restart HCTZ.  Will consider this if his creatinine is normal.  Would start 12.5 mg daily dose if creatinine <1.5 Orders: TLB-BMP (Basic Metabolic Panel-BMET) (80048-METABOL)  Problem # 4:  SLEEP DISORDER, CHRONIC (ICD-780.50) complaince with cpap encouraged  Other Orders: EKG w/ Interpretation (93000) Treadmill (Treadmill) TLB-PT (Protime) (85610-PTP)  Patient Instructions: 1)  Your physician recommends that you schedule a follow-up appointment in: 6 weeks 2)  Your physician has recommended you make the following change in your medication: stop Amiodarone and start Flecainide 100mg  two times a day 3)  Your physician recommends that you return for lab work today if okay will restart HCTZ 12.5mg  daily 4)  Need weekly INR's with Dr [name] 5)  Your physician has requested that you have an exercise tolerance test.  For further information please visit [email or web address]  Please also follow instruction sheet, as given. This needs to be done in one week Prescriptions: FLECAINIDE ACETATE 100 MG TABS (FLECAINIDE ACETATE) one by mouth bid  #60 x 3   Entered by:   Dennis Bast, RN, BSN   Authorized by:   Hillis Range, MD   Signed by:   Dennis Bast, RN, BSN on  10/26/2008   Method used:   Electronically to        [facility] [id number]* (retail)       [address]       [place], [place]  [address]       Ph: [phone]       Fax: [phone]   RxID:   [id number]   Appended Document: eph/jml INR goal = 2-3

## 2010-02-17 NOTE — Assessment & Plan Note (Signed)
Summary: [facility]  Medications Added * PROPAFENONE SR 325 MG 1 by mouth two times a day      Allergies Added: NKDA  CC:  afib/fatigue.  History of Present Illness: [name] is seen in followup for atrial fibrillation.  he was recently cardioverted initially thereafter reverted to atrial fibrillation. We increased his flecainide from 150/75-150 b.i.d. He was cardioverted again on Friday and is holding sinus rhythm.  Unfortunately he is now reverted again to atrial fibrillation.  Reviewing his old record he was last cathetered in 2005; he has not had an intercurrent evaluation of coronary perfusion or left ventricular function since then.  He continues to struggle with weight. He denies significant daytime somnolence or nocturnal snoring.  Current Medications (verified): 1)  Actos 45 Mg  Tabs (Pioglitazone Hcl) .Marland Kitchen.. 1 By Mouth Qd 2)  Metformin Hcl 1000 Mg  Tabs (Metformin Hcl) .Marland Kitchen.. 1bid 3)  Tricor 145 Mg  Tabs (Fenofibrate) .... Take One Tablet Daily 4)  Glimepiride 2 Mg  Tabs (Glimepiride) .... 1/2 Q Am 5)  Allergy Shots 6)  Hydrochlorothiazide 25 Mg  Tabs (Hydrochlorothiazide) .Marland Kitchen.. 1 By Mouth Qd 7)  Atenolol 50 Mg  Tabs (Atenolol) .Marland Kitchen.. 1 By Mouth Qd 8)  Flecainide Acetate 100 Mg  Tabs (Flecainide Acetate) .Marland Kitchen.. 1 Qam and 1 Tab Qpm 9)  Quinapril Hcl 40 Mg  Tabs (Quinapril Hcl) .Marland Kitchen.. 1 By Mouth Qd 10)  Coumadin 5 Mg  Tabs (Warfarin Sodium) .... Take As Directed 11)  Onetouch Ultra Test  Strp (Glucose Blood) .... Test Two Times A Day  Allergies (verified): No Known Drug Allergies  Vital Signs:  Patient profile:   65 year old male Height:      69 inches Weight:      222 pounds BMI:     32.90 Pulse rate:   100 / minute Pulse rhythm:   irregular BP sitting:   117 / 76  (left arm) Cuff size:   regular  Vitals Entered By: Judithe Modest CMA (May 08, 2008 9:13 AM)  Physical Exam  General:  The patient was alert and oriented in no acute distress.Neck veins were  flat, carotids were brisk. Lungs were clear. Heart sounds were regular without murmurs or gallops. Abdomen was soft with active bowel sounds. There is no clubbing cyanosis or edema.  Neurologic:  Alert and oriented x 3. Psych:  Normal affect.   Impression & Recommendations:  Problem # 1:  PAROXYSMAL ATRIAL FIBRILLATION (ICD-427.31) His atrial fibrillation has recurred despite flecainide at 150 twice daily. Will then to discontinue this and began passing on 325 SR b.i.d. He is tentatively scheduled for cardioversion next Friday and is further scheduled for a stress Myoview the week after The following medications were removed from the medication list:    Flecainide Acetate 100 Mg Tabs (Flecainide acetate) .Marland Kitchen... 1 qam and 1 tab qpm His updated medication list for this problem includes:    Atenolol 50 Mg Tabs (Atenolol) .Marland Kitchen... 1 by mouth qd    Coumadin 5 Mg Tabs (Warfarin sodium) .Marland Kitchen... Take as directed  Problem # 2:  HYPERTENSION (ICD-401.9) Adequately controlled His updated medication list for this problem includes:    Hydrochlorothiazide 25 Mg Tabs (Hydrochlorothiazide) .Marland Kitchen... 1 by mouth qd    Atenolol 50 Mg Tabs (Atenolol) .Marland Kitchen... 1 by mouth qd    Quinapril Hcl 40 Mg Tabs (Quinapril hcl) .Marland Kitchen... 1 by mouth qd  Patient Instructions: 1)  Your physician has recommended that you have a cardioversion (DCCV).  Electrical cardioversion uses a jolt of electricity to your heart either through paddles or wired patches attached to your chest. This is a controlled, usually prescheduled, procedure. Defibrillation is done under light anesthesia in the hospital, and you usually go home the day of the procedure. This is done to get your heart back into a normal rhythm. You are not awake for the procedure. Please see the instruction sheet given to you today. 2)  Your physician has requested that you have an exercise stress myoview.  For further information please visit [email or web address]  Please follow  instruction sheet, as given. 3)  Medications will be changed as noted above. Side effects were reviewed.  Appended Document: [facility]      Allergies: No Known Drug Allergies   Impression & Recommendations:  Problem # 1:  PAROXYSMAL ATRIAL FIBRILLATION (ICD-427.31)  His updated medication list for this problem includes:    Atenolol 50 Mg Tabs (Atenolol) .Marland Kitchen... 1 by mouth qd    Coumadin 5 Mg Tabs (Warfarin sodium) .Marland Kitchen... Take as directed  His atrial fibrillation has recurred despite flecainide at 150 twice daily. Will then to discontinue this and began Propafenone 325 SR b.i.d. He is tentatively scheduled for cardioversion next Friday and is further scheduled for a stress Myoview the week after The following medications were removed from the medication list:    Flecainide Acetate 100 Mg Tabs (Flecainide acetate) .Marland Kitchen... 1 qam and 1 tab qpm His updated medication list for this problem includes:    Atenolol 50 Mg Tabs (Atenolol) .Marland Kitchen... 1 by mouth qd    Coumadin 5 Mg Tabs (Warfarin sodium) .Marland Kitchen... Take as directed  Appended Document: [facility]    Clinical Lists Changes  Medications: Added new medication of PROPAFENONE HCL 150 MG TABS (PROPAFENONE HCL) Take one tablet by mouth every 8 hours Changed medication from * PROPAFENONE SR 325 MG 1 by mouth two times a day to RYTHMOL SR 325 MG XR12H-CAP (PROPAFENONE HCL) take one tablet by mouth two times a day - Signed Rx of RYTHMOL SR 325 MG XR12H-CAP (PROPAFENONE HCL) take one tablet by mouth two times a day;  #60 x 12;  Signed;  Entered by: Ollen Gross, RN, BSN;  Authorized by: Nathen May, MD, [facility];  Method used: Electronically to [facility] [id number]*, [address] [place], [place]  [address], Ph: [phone], Fax: [phone]    Prescriptions: RYTHMOL SR 325 MG XR12H-CAP (PROPAFENONE HCL) take one tablet by mouth two times a day  #60 x 12   Entered by:   Ollen Gross, RN, BSN   Authorized by:    Nathen May, MD, [facility]   Signed by:   Ollen Gross, RN, BSN on 05/08/2008   Method used:   Electronically to        [facility] [id number]* (retail)       [address] [place], [place]  [address]       Ph: [phone]       Fax: [phone]   RxID:   [id number]

## 2010-02-17 NOTE — Progress Notes (Signed)
Summary: Follow up Colon  Phone Note Outgoing Call   Summary of Call: Chart to Dr. Jarold Motto for review re need for colonoscopy.   Dr. Jarold Motto suggest pt do IFOB stool check for blood before deciding about follow up colon. Initial call taken by: Ashok Cordia RN,  May 03, 2009 9:25 AM  Follow-up for Phone Call        LM for pt to call. Ashok Cordia RN  May 03, 2009 9:50 AM  Wife notified.   Order inIDX for IFOB.  Letter  in EMR> Follow-up by: Ashok Cordia RN,  May 03, 2009 2:31 PM

## 2010-02-17 NOTE — Progress Notes (Signed)
Summary: fyi hip surgery scheduled  - dr Eliazar Olivar  Phone Note Call from Patient Call back at Work Phone [phone]   Caller: Patient Summary of Call: pt is having hip replacement [id number] wanted to make you aware of surgery Initial call taken by: Okey Regal [place],  July 19, 2006 9:03 AM  Follow-up for Phone Call        Please share lab results from here  with Anesthesia & Surgeon; close glucose monitor needed perioperatively Follow-up by: [name] MD,  July 19, 2006 3:00 PM

## 2010-02-17 NOTE — Progress Notes (Signed)
Summary: cardioversion  didn't work  Phone Note Call from Patient Call back at [employer] [phone]   Caller: Spouse Reason for Call: Talk to Nurse Details for Reason: cardioversion only last 1 day. .. procedure on 9/15. he' going let the ablation heal now.  Initial call taken by: Lorne Skeens,  October 13, 2008 3:25 PM  Follow-up for Phone Call        Will discuss further options upon follow-up in clinic 10/26/08 Follow-up by: Hillis Range, MD,  October 14, 2008 6:20 PM

## 2010-02-17 NOTE — Assessment & Plan Note (Signed)
Summary: eph/post ablation/amber   Visit Type:  Follow-up Referring Provider:  Berton Mount, MD Primary Provider:  [name] MD   History of Present Illness: The patient presents today for routine electrophysiology followup. He has had ERAF post redo afib ablation.  He reports symptoms of fatigue and decreased exercise tolerance with afib.  He has occasional nocturnal palpitations.  The patient denies symptoms of chest pain, orthopnea, PND, dizziness, presyncope, syncope, or neurologic sequela.  The patient is tolerating medications without difficulties and is otherwise without complaint today.   Current Medications (verified): 1)  Glimepiride 2 Mg  Tabs (Glimepiride) .... 1/2 Q Am 2)  Allergy Shots .... Twice Per Month 3)  Quinapril Hcl 40 Mg  Tabs (Quinapril Hcl) .Marland Kitchen.. 1 By Mouth Qd 4)  Coumadin 5 Mg Tabs (Warfarin Sodium) .... Daw Uad 5)  Onetouch Ultra Test  Strp (Glucose Blood) .... Check Bloodsugar 1 X Daily 6)  Multivitamins  Caps (Multiple Vitamin) .... Take 2 Daily 7)  Flecainide Acetate 100 Mg Tabs (Flecainide Acetate) .... Take One &one Half Tablets By Mouth Every 12 Hours 8)  Hydrochlorothiazide 25 Mg Tabs (Hydrochlorothiazide) .... Take One Tablet By Mouth Daily. 9)  Atenolol 50 Mg Tabs (Atenolol) .Marland Kitchen.. 1 By Mouth Once Daily  Allergies: 1)  ! * No Ivp Dye Allergy 2)  ! * No Latex Allergy 3)  ! * No Shellfish Allergy  Past History:  Past Medical History: renal calculus OP 2005 Diabetes mellitus, type II Hyperlipidemia Hypertension Persistent atrial fibrillation s/p ablation 09/22/08 and 11/03/09 Cardioversion-2011  Past Surgical History: THR 7/08  Dr Charlann Boxer; cath 2005 non obstructive CAD Afib ablation 09/22/08 and 11/03/09  Family History: Reviewed history from 09/06/2007 and no changes required. Father: DM,CVA Mother: lung CA Siblings: bro testicular CA no FH premature CAD  Social History: Reviewed history from 07/31/2008 and no changes required. Pt lives in  [place].  He works at [employer].  He previously traveled frequently with his job, but less recently.  Tob- quit 1976.  ETOH- none,  Drugs- none  Review of Systems       All systems are reviewed and negative except as listed in the HPI.   Vital Signs:  Patient profile:   65 year old male Height:      68 inches Weight:      230 pounds BMI:     35.10 Pulse rate:   120 / minute BP sitting:   128 / 90  (left arm)  Vitals Entered By: Laurance Flatten CMA (November 25, 2009 10:46 AM)  Physical Exam  General:  well-nourished; alert,appropriate and cooperative throughout examination Head:  normocephalic and atraumatic Eyes:  PERRLA/EOM intact; conjunctiva and lids normal. Mouth:  Teeth, gums and palate normal. Oral mucosa normal. Neck:  Neck supple, no JVD. No masses, thyromegaly or abnormal cervical nodes. Lungs:  Clear bilaterally to auscultation and percussion. Heart:  iRRR, no m/r/g Abdomen:  Bowel sounds positive; abdomen soft and non-tender without masses, organomegaly, or hernias noted. No hepatosplenomegaly. Msk:  Back normal, normal gait. Muscle strength and tone normal. Pulses:  pulses normal in all 4 extremities Extremities:  No clubbing or cyanosis.  no edema Neurologic:  Alert and oriented x 3.   EKG  Procedure date:  11/25/2009  Findings:      afib, RBBB  V rate 110s  Impression & Recommendations:  Problem # 1:  ATRIAL FIBRILLATION (ICD-427.31) Doing well post ablation, though he has ERAF.  Given RBBB I am reluctant to increase  flecainide further at this time.  He has not tolerated amiodarone or tikosyn previously.  We will check a flecainide level today.  We will also proceed with cardioversion.  R/B/A to cardioversion were discussed with the patient who wishes to proceed.  We will consider increasing atenolol depending on HR post cardioversion.  Problem # 2:  HYPERTENSION (ICD-401.9)  His updated medication list for this problem includes:    Quinapril  Hcl 40 Mg Tabs (Quinapril hcl) .Marland Kitchen... 1 by mouth qd    Hydrochlorothiazide 25 Mg Tabs (Hydrochlorothiazide) .Marland Kitchen... Take one tablet by mouth daily.    Atenolol 50 Mg Tabs (Atenolol) .Marland Kitchen... 1 by mouth once daily  His updated medication list for this problem includes:    Quinapril Hcl 40 Mg Tabs (Quinapril hcl) .Marland Kitchen... 1 by mouth qd    Hydrochlorothiazide 25 Mg Tabs (Hydrochlorothiazide) .Marland Kitchen... Take one tablet by mouth daily.    Atenolol 50 Mg Tabs (Atenolol) .Marland Kitchen... 1 by mouth once daily  Orders: TLB-BMP (Basic Metabolic Panel-BMET) (80048-METABOL) TLB-PT (Protime) (85610-PTP) T-Flecainide (16109-60454) TLB-CBC Platelet - w/Differential (85025-CBCD) TLB-Magnesium (Mg) (83735-MG) TLB-PTT (85730-PTTL)  Problem # 3:  RENAL INSUFFICIENCY (ICD-588.9) we will check BMET today  Other Orders: EKG w/ Interpretation (93000)  Patient Instructions: 1)  Your physician recommends that you return for lab work today 2)  Your physician has recommended that you have a cardioversion (DCCV).  Electrical cardioversion uses a jolt of electricity to your heart either through paddles or wired patches attached to your chest. This is a controlled, usually prescheduled, procedure. Defibrillation is done under light anesthesia in the hospital, and you usually go home the day of the procedure. This is done to get your heart back into a normal rhythm. You are not awake for the procedure. Please see the instruction sheet given to you today.

## 2010-02-17 NOTE — Assessment & Plan Note (Signed)
Summary: diaabetic check & lab.cbs      Current Allergies: No known allergies         Impression & Recommendations:  Problem # 1:  DM (ICD-250.00)  His updated medication list for this problem includes:    Actos 45 Mg Tabs (Pioglitazone hcl) .Marland Kitchen... 1 by mouth qd    Metformin Hcl 1000 Mg Tabs (Metformin hcl) .Marland KitchenMarland KitchenMarland KitchenMarland Kitchen 1bid    Glimepiride 2 Mg Tabs (Glimepiride) .Marland Kitchen... 1/2 q am    Quinapril Hcl 40 Mg Tabs (Quinapril hcl) .Marland Kitchen... 1 by mouth qd  Orders: TLB-A1C / Hgb A1C (Glycohemoglobin) (83036-A1C) TLB-Microalbumin/Creat Ratio, Urine (82043-MALB)   Problem # 2:  HYPERTENSION, ESSENTIAL NOS (ICD-401.9)  His updated medication list for this problem includes:    Hydrochlorothiazide 25 Mg Tabs (Hydrochlorothiazide) .Marland Kitchen... 1 by mouth qd    Atenolol 50 Mg Tabs (Atenolol) .Marland Kitchen... 1 by mouth qd    Quinapril Hcl 40 Mg Tabs (Quinapril hcl) .Marland Kitchen... 1 by mouth qd  Orders: TLB-Creatinine, Blood (82565-CREA) TLB-BUN (Urea Nitrogen) (84520-BUN) TLB-Potassium (K+) (84132-K)   Problem # 3:  HYPERPLASIA PROSTATE UNS W/UR OBST & OTH LUTS (ICD-600.91)  Orders: TLB-PSA (Prostate Specific Antigen) (84153-PSA)   Problem # 4:  FECAL OCCULT BLOOD (ICD-792.1)  Orders: Gastroenterology Referral (GI) TLB-CBC Platelet - w/Differential (85025-CBCD)   Problem # 5:  PAROXYSMAL ATRIAL FIBRILLATION (ICD-427.31) as per Dr [name] His updated medication list for this problem includes:    Atenolol 50 Mg Tabs (Atenolol) .Marland Kitchen... 1 by mouth qd    Flecainide Acetate 100 Mg Tabs (Flecainide acetate) .Marland Kitchen... 1 and 1/2 tab qam and 1 tab qpm    Coumadin 5 Mg Tabs (Warfarin sodium) .Marland Kitchen... Take as directed   Problem # 6:  HYPERLIPIDEMIA NEC/NOS (ICD-272.4)  His updated medication list for this problem includes:    Tricor 145 Mg Tabs (Fenofibrate)  Orders: TLB-Lipid Panel (80061-LIPID) TLB-Hepatic/Liver Function Pnl (80076-HEPATIC) TLB-TSH (Thyroid Stimulating Hormone) (84443-TSH)   Complete Medication  List: 1)  Actos 45 Mg Tabs (Pioglitazone hcl) .Marland Kitchen.. 1 by mouth qd 2)  Metformin Hcl 1000 Mg Tabs (Metformin hcl) .Marland Kitchen.. 1bid 3)  Tricor 145 Mg Tabs (Fenofibrate) 4)  Glimepiride 2 Mg Tabs (Glimepiride) .... 1/2 q am 5)  Allergy Shots  6)  Hydrochlorothiazide 25 Mg Tabs (Hydrochlorothiazide) .Marland Kitchen.. 1 by mouth qd 7)  Atenolol 50 Mg Tabs (Atenolol) .Marland Kitchen.. 1 by mouth qd 8)  Flecainide Acetate 100 Mg Tabs (Flecainide acetate) .Marland Kitchen.. 1 and 1/2 tab qam and 1 tab qpm 9)  Quinapril Hcl 40 Mg Tabs (Quinapril hcl) .Marland Kitchen.. 1 by mouth qd 10)  Lomotil 2.5-0.025 Mg Tabs (Diphenoxylate-atropine) .... Take one tab as needed diarrhea 11)  Coumadin 5 Mg Tabs (Warfarin sodium) .... Take as directed   Patient Instructions: 1)  GI consult for + stool card    ]  Appended Document: diaabetic check & lab.cbs  Laboratory Results   Urine Tests   Date/Time Reported: April 25, 2007 11:12 AM   Routine Urinalysis   Color: yellow Appearance: Clear Glucose: negative   (Normal Range: Negative) Bilirubin: negative   (Normal Range: Negative) Ketone: negative   (Normal Range: Negative) Spec. Gravity: <1.005   (Normal Range: 1.003-1.035) Blood: negative   (Normal Range: Negative) pH: 6.0   (Normal Range: 5.0-8.0) Protein: negative   (Normal Range: Negative) Urobilinogen: negative   (Normal Range: 0-1) Nitrite: negative   (Normal Range: Negative) Leukocyte Esterace: negative   (Normal Range: Negative)    Comments: ..................................................................Marland KitchenFloydene Flock CMA  April 25, 2007 11:13  AM     

## 2010-02-17 NOTE — Assessment & Plan Note (Signed)
Summary: to talk and labs--PH   Vital Signs:  Patient Profile:   65 Years Old Male Weight:      215.8 pounds Temp:     98.3 degrees F oral Pulse rate:   72 / minute Resp:     15 per minute BP sitting:   132 / 84  (left arm) Cuff size:   large  Vitals Entered By: Shonna Chock (September 06, 2007 8:05 AM)                 Chief Complaint:  FASTING-LABS, NUMBNESS AND SWELLING IN HANDS X MONTHS, REFILL MEDICATIONS, and Type 2 diabetes mellitus follow-up.  History of Present Illness: Numbess & tingling of hands for 6+ months @ night, resolves with slight "puffiness" after arising. For example  rings tight for < 2 hrs.No PMH or FH thyroid disease.See pertinent  ROS re thyroid.  Type 2 Diabetes Mellitus Follow-Up      This is a 65 year old man who presents for Type 2 diabetes mellitus follow-up.  FBS 92-94 on average; 2 hr pc < 130.[place] orthostatic symptoms.  The patient reports weight gain and numbness of extremities, but denies polyuria, polydipsia, blurred vision, self managed hypoglycemia, hypoglycemia requiring help, and weight loss.  Other symptoms include chest pain.  The patient denies the following symptoms: neuropathic pain, vomiting, orthostatic symptoms, poor wound healing, intermittent claudication, vision loss, and foot ulcer.  Since the last visit the patient reports poor dietary compliance, compliance with medications, not exercising regularly, and monitoring blood glucose.  The patient has been measuring capillary blood glucose before breakfast and after dinner.  Since the last visit, the patient reports having had eye care by an ophthalmologist and no foot care.         The patient also complains of Chest pain.  The patient reports exertional chest pain and shortness of breath, but denies resting chest pain, nausea, vomiting, diaphoresis, palpitations, dizziness, light headedness, syncope, and indigestion.  The pain is described as intermittent and burning.  The pain is located in  the epigastric area and the pain does not radiate.  Episodes of chest pain last < 1 minute.  The pain is brought on or made worse by moderate activity.  The pain is relieved or improved with rest.    Hypertension History:      He complains of dyspnea with exertion, peripheral edema, and neurologic problems, but denies headache, palpitations, orthopnea, PND, visual symptoms, syncope, and side effects from treatment.  He notes no problems with any antihypertensive medication side effects.  Further comments include: Occa exertional cp in epigastrium, burning ,lasts < 1 min. No sweating or nausea. BP 120/70 @ home.        Positive major cardiovascular risk factors include male age 64 years old or older, diabetes, hyperlipidemia, and hypertension.  Negative major cardiovascular risk factors include negative family history for ischemic heart disease and non-tobacco-user status.        Further assessment for target organ damage reveals no history of ASHD, stroke/TIA, or peripheral vascular disease.    Lipid Management History:      Positive NCEP/ATP III risk factors include male age 64 years old or older or older, diabetes, and hypertension.  Negative NCEP/ATP III risk factors include no family history for ischemic heart disease, non-tobacco-user status, no ASHD (atherosclerotic heart disease), no prior stroke/TIA, no peripheral vascular disease, and no history of aortic aneurysm.        Current Allergies (reviewed today): No known allergies  Past Medical History:    renal calculus OP 2005 ;PAF ,S/P cardioversion 9/08    Diabetes mellitus, type II    Hyperlipidemia    Hypertension  Past Surgical History:    PAF ; S/P ablation ?2001; cardioversion X3 ; TKR 7/08  Dr Charlann Boxer; cath 2005 non obstructive CAD; colonscopy 2009 :neg   Family History:    Father: DM,CVA    Mother: lung CA    Siblings: bro testicular CA    no FH premature CAD  Social History:    no diet @ present   Risk Factors:  Alcohol  use:  yes    Type:  rare   Review of Systems  General      Denies fatigue and sleep disorder.  Eyes      Denies blurring, double vision, and vision loss-both eyes.  ENT      Denies difficulty swallowing and hoarseness.  CV      Denies bluish discoloration of lips or nails.  Resp      Denies excessive snoring, hypersomnolence, and morning headaches.  GI      Denies bloody stools, dark tarry stools, and indigestion.  GU      Denies urinary frequency.      Nocturia  2-3 X/ night  Derm      Denies changes in nail beds, dryness, and hair loss.  Neuro      See HPI      Denies disturbances in coordination and poor balance.  Psych      Denies anxiety, depression, easily angered, easily tearful, and irritability.  Endo      Complains of heat intolerance.      Denies cold intolerance, excessive hunger, excessive thirst, excessive urination, polyuria, and weight change.   Physical Exam  General:     well-nourished,in no acute distress; alert,appropriate and cooperative throughout examination Eyes:     Eyebrows thin , laterally.No lid lag Mouth:     Oral mucosa and oropharynx without lesions or exudates.  Teeth in good repair. pharynx pink and moist.   Neck:     No deformities, masses, or tenderness noted. Lungs:     Normal respiratory effort, chest expands symmetrically. Lungs are clear to auscultation, no crackles or wheezes. Heart:     Normal rate and regular rhythm. S1 and S2 normal without gallop, murmur, click, rub or other extra sounds. Abdomen:     Bowel sounds positive,abdomen soft and non-tender without masses, organomegaly or hernias noted.Protuberant Pulses:     R and L carotid,radial,dorsalis pedis and posterior tibial pulses are full and equal bilaterally Extremities:     Good nail health; neg Tinel's Neurologic:     alert & oriented X3 and DTRs symmetrical and 0-1/2+ Skin:     Intact without suspicious lesions or rashes. Tanned Cervical Nodes:      No lymphadenopathy noted Axillary Nodes:     No palpable lymphadenopathy Psych:     memory intact for recent and remote, normally interactive, good eye contact, not anxious appearing, and not depressed appearing.      Impression & Recommendations:  Problem # 1:  DIABETES MELLITUS, TYPE II (ICD-250.00)  His updated medication list for this problem includes:    Actos 45 Mg Tabs (Pioglitazone hcl) .Marland Kitchen... 1 by mouth qd    Metformin Hcl 1000 Mg Tabs (Metformin hcl) .Marland KitchenMarland KitchenMarland KitchenMarland Kitchen 1bid    Glimepiride 2 Mg Tabs (Glimepiride) .Marland Kitchen... 1/2 q am    Quinapril Hcl 40 Mg Tabs (Quinapril hcl) .Marland KitchenMarland KitchenMarland KitchenMarland Kitchen 1  by mouth qd  Orders: Venipuncture (54098) TLB-A1C / Hgb A1C (Glycohemoglobin) (83036-A1C) TLB-Microalbumin/Creat Ratio, Urine (82043-MALB)   Problem # 2:  HYPERLIPIDEMIA (ICD-272.4)  His updated medication list for this problem includes:    Tricor 145 Mg Tabs (Fenofibrate) .Marland Kitchen... Take one tablet daily  Orders: Venipuncture (11914) TLB-Lipid Panel (80061-LIPID) TLB-Hepatic/Liver Function Pnl (80076-HEPATIC)   Problem # 3:  HYPERTENSION (ICD-401.9)  His updated medication list for this problem includes:    Hydrochlorothiazide 25 Mg Tabs (Hydrochlorothiazide) .Marland Kitchen... 1 by mouth qd    Atenolol 50 Mg Tabs (Atenolol) .Marland Kitchen... 1 by mouth qd    Quinapril Hcl 40 Mg Tabs (Quinapril hcl) .Marland Kitchen... 1 by mouth qd  Orders: EKG w/ Interpretation (93000) Venipuncture (78295) TLB-Creatinine, Blood (82565-CREA) TLB-BUN (Urea Nitrogen) (84520-BUN) TLB-Potassium (K+) (84132-K)   Problem # 4:  CHEST PAIN, EXERTIONAL (ICD-786.50)  Orders: EKG w/ Interpretation (93000) Cardiology Referral (Cardiology)   Problem # 5:  CARPAL TUNNEL SYNDROME (ICD-354.0)  Orders: TLB-TSH (Thyroid Stimulating Hormone) (84443-TSH) TLB-PT (Protime) (85610-PTP) TLB-T4 (Thyrox), Free (62130-QM5H) Ankle / Wrist Splint (A4570)   Complete Medication List: 1)  Actos 45 Mg Tabs (Pioglitazone hcl) .Marland Kitchen.. 1 by mouth qd 2)  Metformin Hcl 1000 Mg  Tabs (Metformin hcl) .Marland Kitchen.. 1bid 3)  Tricor 145 Mg Tabs (Fenofibrate) .... Take one tablet daily 4)  Glimepiride 2 Mg Tabs (Glimepiride) .... 1/2 q am 5)  Allergy Shots  6)  Hydrochlorothiazide 25 Mg Tabs (Hydrochlorothiazide) .Marland Kitchen.. 1 by mouth qd 7)  Atenolol 50 Mg Tabs (Atenolol) .Marland Kitchen.. 1 by mouth qd 8)  Flecainide Acetate 100 Mg Tabs (Flecainide acetate) .Marland Kitchen.. 1 and 1/2 tab qam and 1 tab qpm 9)  Quinapril Hcl 40 Mg Tabs (Quinapril hcl) .Marland Kitchen.. 1 by mouth qd 10)  Coumadin 5 Mg Tabs (Warfarin sodium) .... Take as directed  Hypertension Assessment/Plan:      The patient's hypertensive risk group is category C: Target organ damage and/or diabetes.  His calculated 10 year risk of coronary heart disease is 11 %.  Today's blood pressure is 132/84.    Lipid Assessment/Plan:      Based on NCEP/ATP III, the patient's risk factor category is "history of diabetes".  From this information, the patient's calculated lipid goals are as follows: Total cholesterol goal is 200; LDL cholesterol goal is 100; HDL cholesterol goal is 40; Triglyceride goal is 150.  His LDL cholesterol goal has been met.     Patient Instructions: 1)  Follow [facility] program. [facility] may monitor PTs from [place] if they agree; I would require PTs done here( [facility])for me to manage  for reasons we discussed.   Prescriptions: QUINAPRIL HCL 40 MG  TABS (QUINAPRIL HCL) 1 by mouth qd  #90 x 3   Entered and Authorized by:   [name] MD   Signed by:   [name] MD on 09/06/2007   Method used:   Print then Give to Patient   RxID:   [id number] HYDROCHLOROTHIAZIDE 25 MG  TABS (HYDROCHLOROTHIAZIDE) 1 by mouth qd  #90 x 1   Entered and Authorized by:   [name] MD   Signed by:   [name] MD on 09/06/2007   Method used:   Print then Give to Patient   RxID:   [id number]  ]

## 2010-02-17 NOTE — Progress Notes (Signed)
Summary: not sleeping  HOP SEE PLEASE  Phone Note Call from Patient   Caller: Spouse Reason for Call: Acute Illness, Talk to Nurse Summary of Call: dr. [name] [phone] [facility] [address] pt had surgery last week and since then he has not been able to sleep at night. he has tried [place] and it didnt work. pt had hip replacement surgery Initial call taken by: Charolette Child,  August 17, 2006 9:17 AM  Follow-up for Phone Call        SPOKE WITH PT WIFE TOOK A 10 MG AMBIEN, NOT ABLE TO SLEEP, SPOKE WITH DR Charlann Boxer THEY SAID DR Brei Pociask SHOULD GIVE HIM THE PRESCRIPTION. PT IS ON VICODIN TAKE A 5MG  /325 MG TAKE 2 EVERY 4 HOURS  RIGHT NOW AND A MUSCLE RELAXER 5MG  1 EVERY 6 HOURS    Follow-up by: Kandice Hams,  August 17, 2006 10:28 AM  Additional Follow-up for Phone Call Additional follow up Details #1::        the safest thing to do is to double the muscle relaxant  & retry the Ambien 10 mg at bedtime.. I would be concerned about mixing any other meds with the narcotic pain medication. Additional Follow-up by: [name] MD,  August 17, 2006 5:03 PM    Additional Follow-up for Phone Call Additional follow up Details #2::    discuss with wife ..................................................................Marland KitchenShary Decamp  August 17, 2006 5:13 PM

## 2010-02-17 NOTE — Assessment & Plan Note (Signed)
Summary: to talk about med/pt check//ph   Vital Signs:  Patient profile:   65 year old male Weight:      236 pounds Temp:     98.2 degrees F oral Pulse rate:   60 / minute Resp:     16 per minute BP sitting:   118 / 80  (left arm) Cuff size:   large  Vitals Entered By: Shonna Chock CMA (August 18, 2009 12:36 PM)  CC: 1.) Discuss Actos: patient concern if it is associated with some hand swelling that he is experiencing   2.) PT/INR Check, Type 2 diabetes mellitus follow-up   Primary Care Provider:  [name] MD  CC:  1.) Discuss Actos: patient concern if it is associated with some hand swelling that he is experiencing   2.) PT/INR Check and Type 2 diabetes mellitus follow-up.  History of Present Illness:  Type 2 Diabetes Mellitus Follow-Up      This is a 65 year old man who presents for Type 2 diabetes mellitus follow-up.  The patient reports weight gain  of 15# . He questions whether this gain  & swelling in hands are  related to Actos . He has positional  numbness of upper extremities, but denies polyuria, polydipsia, blurred vision, and self managed hypoglycemia.  Other symptoms include occasional  orthostatic symptoms.  The patient denies the following symptoms: neuropathic pain, chest pain, vomiting, poor wound healing, intermittent claudication, vision loss, and foot ulcer.  Since the last visit the patient reports fair dietary compliance, good compliance with medications. His exercise has been impacted  by the heat & persistant AF.  The patient has been measuring capillary blood glucose before breakfast, range 90-110.  Since the last visit, the patient reports having had eye care by an ophthalmologist  but  no foot care.    Allergies: 1)  ! * No Ivp Dye Allergy 2)  ! * No Latex Allergy 3)  ! * No Shellfish Allergy 4)  ! Clonazepam (Clonazepam)  Review of Systems CV:  Denies difficulty breathing at night and difficulty breathing while lying down. MS:  Pain in both  Achilles tendons for 6 months; no treatment. Derm:  Complains of lesion(s); "Spot " R shin , no change over 12 months. Psych:  Denies anxiety and depression; Work stresses.  Physical Exam  General:  well-nourished; alert,appropriate and cooperative throughout examination; flat affect Neck:  No deformities, masses, or tenderness noted. Lungs:  Normal respiratory effort, chest expands symmetrically. Lungs are clear to auscultation, no crackles or wheezes. Heart:  no rub, no JVD, bradycardia, and irregular rhythm.   Extremities:  No clubbing, cyanosis. Trace edema . No Achilles tenderness Neurologic:  sensation intact to light touch over feet and DTRs symmetrical  but 0-1/2 + Skin:  Intact without suspicious lesions or rashes. Tanned.2X 2 mm nevus R shin Psych:  memory intact for recent and remote, normally interactive but subdued.     Impression & Recommendations:  Problem # 1:  WEIGHT GAIN (ICD-783.1)  ? Actos related  Orders: TLB-TSH (Thyroid Stimulating Hormone) (84443-TSH) Specimen Handling (84132)  Problem # 2:  DIABETES MELLITUS, TYPE II (ICD-250.00)  The following medications were removed from the medication list:    Actos 45 Mg Tabs (Pioglitazone hcl) .Marland Kitchen... 1 by mouth qd His updated medication list for this problem includes:    Glimepiride 2 Mg Tabs (Glimepiride) .Marland Kitchen... 1/2 q am    Quinapril Hcl 40 Mg Tabs (Quinapril hcl) .Marland Kitchen... 1 by mouth qd  Orders: Venipuncture (81191) TLB-Creatinine, Blood (82565-CREA) TLB-BUN (Urea Nitrogen) (84520-BUN) TLB-A1C / Hgb A1C (Glycohemoglobin) (83036-A1C) Specimen Handling (47829)  Problem # 3:  EDEMA (ICD-782.3)  ? Actos related His updated medication list for this problem includes:    Hydrochlorothiazide 25 Mg Tabs (Hydrochlorothiazide) .Marland Kitchen... Take one tablet by mouth daily.  Orders: Venipuncture (56213) TLB-Creatinine, Blood (82565-CREA) TLB-BUN (Urea Nitrogen) (84520-BUN)  Problem # 4:  ATRIAL FIBRILLATION  (ICD-427.31) Recurrent His updated medication list for this problem includes:    Coumadin 5 Mg Tabs (Warfarin sodium) .Marland Kitchen... Daw uad    Flecainide Acetate 150 Mg Tabs (Flecainide acetate) .Marland Kitchen... 1 by mouth am, 1/2 by mouth afternoon    Atenolol 50 Mg Tabs (Atenolol) .Marland Kitchen... 1 by mouth once daily  Orders: Protime (08657QI)  Problem # 5:  ACHILLES BURSITIS OR TENDINITIS (ICD-726.71) Arch supports , stretching, & Zostrix cream topically  as needed  recommended  Complete Medication List: 1)  Glimepiride 2 Mg Tabs (Glimepiride) .... 1/2 q am 2)  Allergy Shots  .... Twice per month 3)  Quinapril Hcl 40 Mg Tabs (Quinapril hcl) .Marland Kitchen.. 1 by mouth qd 4)  Coumadin 5 Mg Tabs (Warfarin sodium) .... Daw uad 5)  Onetouch Ultra Test Strp (Glucose blood) .... Test two times a day 6)  Multivitamins Caps (Multiple vitamin) .... Take 2 daily 7)  Flecainide Acetate 150 Mg Tabs (Flecainide acetate) .Marland Kitchen.. 1 by mouth am, 1/2 by mouth afternoon 8)  Hydrochlorothiazide 25 Mg Tabs (Hydrochlorothiazide) .... Take one tablet by mouth daily. 9)  Atenolol 50 Mg Tabs (Atenolol) .Marland Kitchen.. 1 by mouth once daily  Patient Instructions: 1)  Check your blood sugars regularly. If your readings are usually above : 150 or below 90 you should contact our office. 2)  See your eye doctor yearly to check for diabetic eye damage. 3)  Check your feet each night for sore areas, calluses or signs of infection.   ANTICOAGULATION RECORD PREVIOUS REGIMEN & LAB RESULTS Anticoagulation Diagnosis:  Atrial fibrillation on  09/04/2008 Previous INR Goal Range:  2-3 on  09/15/2008 Previous INR:  2.60 on  05/28/2009 Previous Coumadin Dose(mg):  5mg  M/W/F, 2.5mg  all other days on  02/09/2009 Previous Regimen:  5MG  TODAY, THEN 5MG  ON WED, 2.5MG  ALL OTHER DAYS on  11/13/2008 Previous Coagulation Comments:  Patient taking 2.5 mg daily, patient to continue current regimen and return to clinic in one month. on  07/13/2008  NEW REGIMEN & LAB  RESULTS Current INR: 3.2 Current Coumadin Dose(mg): 5mg  M/W/F, 2.5mg  all other days Regimen: 5MG  TODAY, THEN 5MG  ON WED, 2.5MG  ALL OTHER DAYS  (no change)  Provider: Eisen Robenson,w MEDICATIONS GLIMEPIRIDE 2 MG  TABS (GLIMEPIRIDE) 1/2 q am * ALLERGY SHOTS twice per month QUINAPRIL HCL 40 MG  TABS (QUINAPRIL HCL) 1 by mouth qd COUMADIN 5 MG TABS (WARFARIN SODIUM) DAW UAD ONETOUCH ULTRA TEST  STRP (GLUCOSE BLOOD) test two times a day MULTIVITAMINS  CAPS (MULTIPLE VITAMIN) Take 2 daily FLECAINIDE ACETATE 150 MG TABS (FLECAINIDE ACETATE) 1 by mouth am, 1/2 by mouth afternoon HYDROCHLOROTHIAZIDE 25 MG TABS (HYDROCHLOROTHIAZIDE) Take one tablet by mouth daily. ATENOLOL 50 MG TABS (ATENOLOL) 1 by mouth once daily   Anticoagulation Visit Questionnaire      Coumadin dose missed/changed:  No      Abnormal Bleeding Symptoms:  No   Any diet changes including alcohol intake, vegetables or greens since the last visit:  No Any illnesses or hospitalizations since the last visit:  No Any signs of clotting since the last visit (  including chest discomfort, dizziness, shortness of breath, arm tingling, slurred speech, swelling or redness in leg):  No

## 2010-02-17 NOTE — Assessment & Plan Note (Signed)
Summary: PT CHECK/CDJ  Nurse Visit   Vital Signs:  Patient profile:   65 year old male Weight:      222 pounds Pulse rate:   60 / minute Resp:     14 per minute BP sitting:   110 / 80  Vitals Entered By: Kandice Hams (September 04, 2008 3:19 PM)  Impression & Recommendations:  Problem # 1:  ATRIAL FIBRILLATION, CHRONIC (ICD-427.31)  His updated medication list for this problem includes:    Atenolol 50 Mg Tabs (Atenolol) .Marland Kitchen... 1 by mouth qd    Coumadin 1 Mg Tabs (Warfarin sodium) .Marland Kitchen... Take 2 tablets by mouth once daily as directed    Amiodarone Hcl 400 Mg Tabs (Amiodarone hcl) .Marland Kitchen... Take one tablet by mouth twice a day for 2 weeks, then take one tablet by mouth daily  Orders: Protime (16109UE)  Problem # 2:  PAROXYSMAL ATRIAL FIBRILLATION (ICD-427.31)  His updated medication list for this problem includes:    Atenolol 50 Mg Tabs (Atenolol) .Marland Kitchen... 1 by mouth qd    Coumadin 1 Mg Tabs (Warfarin sodium) .Marland Kitchen... Take 2 tablets by mouth once daily as directed    Amiodarone Hcl 400 Mg Tabs (Amiodarone hcl) .Marland Kitchen... Take one tablet by mouth twice a day for 2 weeks, then take one tablet by mouth daily  Complete Medication List: 1)  Actos 45 Mg Tabs (Pioglitazone hcl) .Marland Kitchen.. 1 by mouth qd 2)  Tricor 145 Mg Tabs (Fenofibrate) .... Take one tablet daily 3)  Glimepiride 2 Mg Tabs (Glimepiride) .... 1/2 q am 4)  Allergy Shots  5)  Atenolol 50 Mg Tabs (Atenolol) .Marland Kitchen.. 1 by mouth qd 6)  Quinapril Hcl 40 Mg Tabs (Quinapril hcl) .Marland Kitchen.. 1 by mouth qd 7)  Coumadin 1 Mg Tabs (Warfarin sodium) .... Take 2 tablets by mouth once daily as directed 8)  Onetouch Ultra Test Strp (Glucose blood) .... Test two times a day 9)  Amiodarone Hcl 400 Mg Tabs (Amiodarone hcl) .... Take one tablet by mouth twice a day for 2 weeks, then take one tablet by mouth daily   Patient Instructions: 1)  Coumadin 2 mg today & every M,W,F, & Sun. One mg T,Th ,& Sat.PT/INR 1 week.  CC: pt ck   Allergies: 1)  ! * No Ivp Dye  Allergy 2)  ! * No Latex Allergy 3)  ! * No Shellfish Allergy Laboratory Results   Blood Tests      INR: 1.6   (Normal Range: 0.88-1.12   Therap INR: 2.0-3.5)    Orders Added: 1)  Est. Patient Level I [45409] 2)  Protime [81191YN] Prescriptions: ACTOS 45 MG  TABS (PIOGLITAZONE HCL) 1 by mouth qd  #90 x 2   Entered by:   Kandice Hams   Authorized by:   [name] MD   Signed by:   Kandice Hams on 09/04/2008   Method used:   Print then Give to Patient   RxID:   [id number] GLIMEPIRIDE 2 MG  TABS (GLIMEPIRIDE) 1/2 q am  #90 x 0   Entered by:   Kandice Hams   Authorized by:   [name] MD   Signed by:   Kandice Hams on 09/04/2008   Method used:   Print then Give to Patient   RxID:   [id number] QUINAPRIL HCL 40 MG  TABS (QUINAPRIL HCL) 1 by mouth qd  #90 x 2   Entered by:   Kandice Hams   Authorized by:   [name] MD  Signed by:   Kandice Hams on 09/04/2008   Method used:   Print then Give to Patient   RxID:   [id number] TRICOR 145 MG  TABS (FENOFIBRATE) take one tablet daily  #90 x 2   Entered by:   Kandice Hams   Authorized by:   [name] MD   Signed by:   Kandice Hams on 09/04/2008   Method used:   Print then Give to Patient   RxID:   [id number]    ANTICOAGULATION RECORD PREVIOUS REGIMEN & LAB RESULTS Anticoagulation Diagnosis:  Atrial fibrillation on  08/28/2008  Previous INR:  2.1 on  08/28/2008 Previous Coumadin Dose(mg):  no coumadin on 8/9, 8/10....patient took 2mg  on 8/11 & 1mg  on 8/12 on  08/28/2008 Previous Regimen:  1mg  daily except 2 mg on tues, thurs, sat on  08/28/2008 Previous Coagulation Comments:  Patient taking 2.5 mg daily, patient to continue current regimen and return to clinic in one month. on  07/13/2008  NEW REGIMEN & LAB RESULTS Anticoag. Dx: Atrial fibrillation Current INR: 1.6 Current Coumadin Dose(mg): 1mg  qd except 2mg  tu th sa Regimen: 1mg  daily except 2 mg on tues, thurs, sat  (no  change)  MEDICATIONS ACTOS 45 MG  TABS (PIOGLITAZONE HCL) 1 by mouth qd TRICOR 145 MG  TABS (FENOFIBRATE) take one tablet daily GLIMEPIRIDE 2 MG  TABS (GLIMEPIRIDE) 1/2 q am * ALLERGY SHOTS  ATENOLOL 50 MG  TABS (ATENOLOL) 1 by mouth qd QUINAPRIL HCL 40 MG  TABS (QUINAPRIL HCL) 1 by mouth qd COUMADIN 1 MG TABS (WARFARIN SODIUM) Take 2 tablets by mouth once daily as directed [BMN] ONETOUCH ULTRA TEST  STRP (GLUCOSE BLOOD) test two times a day AMIODARONE HCL 400 MG TABS (AMIODARONE HCL) Take one tablet by mouth twice a day for 2 weeks, then take one tablet by mouth daily

## 2010-02-17 NOTE — Progress Notes (Signed)
Summary: need samples of Trajenta  Phone Note Call from Patient Message from:  Patient on January 04, 2010 11:47 AM  Caller: Patient Summary of Call: Patient will be coming in tomorrow morning --Wed 12/21---to get shingles shot, then lab work---can he pick up four more packages samples of Trajenta to hold him over til Jan, then he will make appt to see Dr [name]  (see 10/20 --samples of Trajenta given) Initial call taken by: Jerolyn Shin,  January 04, 2010 11:54 AM  Follow-up for Phone Call        Samples were given in office. Lucious Groves CMA  January 05, 2010 9:42 AM

## 2010-02-17 NOTE — Letter (Signed)
Summary: Cardioversion/TEE Instructions  Architectural technologist, Main Office  [address]   [place], [place] [address]   Phone: [phone]  Fax: [phone]    Cardioversion  Instructions  11/25/2009 MRN: [id number]  [facility] [address] [place], [place]  [address]  Dear Mr. [facility], You are scheduled for a Cardioversion  on 12/29/09 with Dr. Shirlee Latch.   Please arrive at the [facility] at 11:00 a.m. on the day of your procedure.  1)   DIET:  A)   Nothing to eat or drink after midnight except your medications with a sip of water.    2)   Come to the [place] office on 11/25/09 for lab work. You do not have to be fasting.  3)   MAKE SURE YOU TAKE YOUR COUMADIN.  4)   A)   DO NOT TAKE these medications before your procedure: Glimepiride  B)   YOU MAY TAKE ALL of your remaining medications with a small amount of water.    5)  Must have a responsible person to drive you home.  6)   Bring a current list of your medications and current insurance cards.   * Special Note:  Every effort is made to have your procedure done on time. Occasionally there are emergencies that present themselves at the hospital that may cause delays. Please be patient if a delay does occur.  * If you have any questions after you get home, please call the office at [phone]   Have insurance fax over to [employer]  [phone]

## 2010-02-17 NOTE — Letter (Signed)
Summary: Results Follow up Letter  [name] at [place]  [address] [place], [place] [address]   Phone: [phone]  Fax: [phone]    06/18/2008 MRN: [id number]  [facility] [address] [place], [place]  [address]  Dear [name],  The following are the results of your recent test(s):  Test         Result    Pap Smear:        Normal _____  Not Normal _____ Comments: ______________________________________________________ Cholesterol: LDL(Bad cholesterol):         Your goal is less than:         HDL (Good cholesterol):       Your goal is more than: Comments:  ______________________________________________________ Mammogram:        Normal _____  Not Normal _____ Comments:  ___________________________________________________________________ Hemoccult:        Normal _____  Not normal _______ Comments:    _____________________________________________________________________ Other Tests: Please see attached labs done on 06/09/2008    We routinely do not discuss normal results over the telephone.  If you desire a copy of the results, or you have any questions about this information we can discuss them at your next office visit.   Sincerely,

## 2010-02-17 NOTE — Progress Notes (Signed)
Summary: anti diarrhea med going out of country/HOP SEE PLEASE  Phone Note Call from Patient Call back at Home Phone [phone]   Caller: Patient Summary of Call: patient is going to [place] next week - wants something for dirrehea - just in case - he doesnt want cipro due to other med he takes - [facility]  Initial call taken by: Okey Regal [place],  January 07, 2007 11:43 AM  Follow-up for Phone Call        see Rx; also consider PeptoBismol tabs which are very effective Follow-up by: [name] MD,  January 07, 2007 6:02 PM  Additional Follow-up for Phone Call Additional follow up Details #1::        called and left message for patient and called med to pharmacy....................................................................Marland KitchenWendall Stade  January 08, 2007 8:02 AM     New/Updated Medications: LOMOTIL 2.5-0.025 MG  TABS (DIPHENOXYLATE-ATROPINE) take one tab as needed diarrhea   Prescriptions: LOMOTIL 2.5-0.025 MG  TABS (DIPHENOXYLATE-ATROPINE) take one tab as needed diarrhea  #10 x 0   Entered by:   Wendall Stade   Authorized by:   [name] MD   Signed by:   Wendall Stade on 01/08/2007   Method used:   Electronically sent to ...       [address] [id number]*       [address]       [place], [place]  [address]       Ph: [phone]       Fax: [phone]   RxID:   [id number]

## 2010-02-17 NOTE — Assessment & Plan Note (Signed)
Summary: shingles shot; then labs=a1c, urine microalbumin  250.02///sph  Nurse Visit  CC: Shingles vacc./kb   Allergies: 1)  ! * No Ivp Dye Allergy 2)  ! * No Latex Allergy 3)  ! * No Shellfish Allergy  Immunizations Administered:  Zostavax # 1:    Vaccine Type: Zostavax    Site: left deltoid    Mfr: Merck    Dose: 0.45ml    Route: [place]    Given by: Lucious Groves CMA    Exp. Date: 11/21/2010    Lot #: [id number]    VIS given: 10/28/04 given January 05, 2010.  Orders Added: 1)  Zoster (Shingles) Vaccine Live [90736] 2)  Admin 1st Vaccine [90471]  Appended Document: shingles shot; then labs=a1c, urine microalbumin  250.02///sph

## 2010-02-17 NOTE — Letter (Signed)
Summary: Generic Letter  Architectural technologist, Main Office  [address]   [place], [place] [address]   Phone: [phone]  Fax: [phone]        October 15, 2009 MRN: [id number]    [facility] [address] [place], [place]  [address]   To Whom It May Concern,     Mr. Roycroft will be out of work from 11/01/09-11/10/09 for a heart procedure.  For any questions call our office at [phone].     Sincerely,    Dr. Fayrene Fearing Maritssa Haughton,MD/Kelly Darl Pikes, RN, BSN  This letter has been electronically signed by your physician.

## 2010-02-17 NOTE — Progress Notes (Signed)
Summary: Need note to return to work tomorrow  Phone Note Call from Patient Call back at [phone]   Caller: [name] Summary of Call: Pt wife calling to get a note for pt to return  to work tomorrow fax note to [phone] Initial call taken by: Judie Grieve,  November 10, 2009 9:52 AM  Follow-up for Phone Call        Pt wife still wanting on note for the pt to return to work on tomorrow can now pick note up  call wife [phone] Judie Grieve  November 10, 2009 1:28 PM pt wife showed up to pick up note.  note printed and sent with her. Dennis Bast, RN, BSN  November 10, 2009 2:49 PM

## 2010-02-17 NOTE — Progress Notes (Signed)
Summary: HOPPER-GLIMEPRIRIDE 2MG ,ACTOS 45MG ,METFORMIN 1000MG ,TRICOR 145MG   Phone Note Refill Request Message from:  Pharmacy on [facility]  Refills Requested: Medication #1:  GLIMEPIRIDE 2 MG  TABS 1/2 q am  Medication #2:  ACTOS 45 MG  TABS 1 by mouth qd  Medication #3:  METFORMIN HCL 1000 MG  TABS 1bid  Medication #4:  TRICOR 145 MG  TABS TAKE 1 TAB BY MOUTH EVERY MORNING RECD BY PT. [name] [phone] LAST FILLED #45,#90,#180,#90    PT NUMBER [id number]  [phone]  Initial call taken by: Vanessa [place],  November 15, 2006 8:56 AM      Prescriptions: GLIMEPIRIDE 2 MG  TABS (GLIMEPIRIDE) 1/2 q am  #90 x 1   Entered by:   Wendall Stade   Authorized by:   [name] MD   Signed by:   Wendall Stade on 11/15/2006   Method used:   Printed then faxed to ...         RxID:   [id number] TRICOR 145 MG  TABS (FENOFIBRATE)   #90 x 1   Entered by:   Wendall Stade   Authorized by:   [name] MD   Signed by:   Wendall Stade on 11/15/2006   Method used:   Printed then faxed to ...         RxID:   [id number] METFORMIN HCL 1000 MG  TABS (METFORMIN HCL) 1bid  #180 x 1   Entered by:   Wendall Stade   Authorized by:   [name] MD   Signed by:   Wendall Stade on 11/15/2006   Method used:   Printed then faxed to ...         RxID:   [id number] ACTOS 45 MG  TABS (PIOGLITAZONE HCL) 1 by mouth qd  #90 x 1   Entered by:   Wendall Stade   Authorized by:   [name] MD   Signed by:   Wendall Stade on 11/15/2006   Method used:   Printed then faxed to ...         RxID:   [id number]

## 2010-02-17 NOTE — Progress Notes (Signed)
Summary: Actos Concerns  Phone Note Call from Patient Call back at Home Phone [phone]   Caller: Spouse Summary of Call: Message left on Triage VM: Patient seen something on TV about Actos causing Fluid retention and would like to know if Dr.Hopper would like for him to stop this med. Please call with advice  Per Dr.Hopper patient needs to keep DM/ Bloodsugar under control or he will increase his risk of heart attack/stroke, unless patient with significant weight gain or significant swelling he is to continue actos, If patient would like to futher discuss, patient needs to schedule an appointment.  I left a message on patient's home VM with Dr.Hopper's instruction./Chrae [facility] CMA  August 17, 2009 4:57 PM

## 2010-02-17 NOTE — Progress Notes (Signed)
Summary: Refill Request  Phone Note Refill Request Call back at Work Phone [phone] Message from:  Patient  Refills Requested: Medication #1:  ONETOUCH ULTRA TEST  STRP test two times a day  Medication #2:  COUMADIN 5 MG TABS DAW UAD [facility] [phone]   Method Requested: Fax to Mail Away Pharmacy    New/Updated Medications: ONETOUCH ULTRA TEST  STRP (GLUCOSE BLOOD) Check bloodsugar 1 x daily Prescriptions: ONETOUCH ULTRA TEST  STRP (GLUCOSE BLOOD) Check bloodsugar 1 x daily  #100 x 3   Entered by:   Shonna Chock CMA   Authorized by:   [name] MD   Signed by:   Shonna Chock CMA on 10/11/2009   Method used:   Print then Give to Patient   RxID:   [id number] COUMADIN 5 MG TABS (WARFARIN SODIUM) DAW UAD Brand medically necessary #90 x 1   Entered by:   Shonna Chock CMA   Authorized by:   [name] MD   Signed by:   Shonna Chock CMA on 10/11/2009   Method used:   Print then Give to Patient   RxID:   [id number]

## 2010-02-17 NOTE — Progress Notes (Signed)
Summary: WANTS COUMADIN LEVELS  Phone Note Call from Patient Call back at [phone] CELL   Caller: Spouse Call For: Jeffrey Melnick MD Reason for Call: Lab or Test Results Summary of Call: PT'S SPOUSE, NANCY, CALLING, SAYS PT WAS HERE EARLY THIS MORNING FOR LABS & IS VERY ANXIOUS FOR HIS COUMADIN LEVELS.  WAS TOLD HE COULD CALL THIS AFTERNOON TO GET RESULTS.  PLEASE CALL PHONE ABOVE. Initial call taken by: Magdalen Spatz [facility],  September 14, 2008 1:22 PM  Follow-up for Phone Call        D/W PATIENT'S WIFE LABS NOT SIGNED OFF ON YET BUT I HAVE PRINTED THEM OFF AND WILL MAKE SURE THEY GET ADDRESSED TODAY Follow-up by: Shonna Chock,  September 14, 2008 3:35 PM  Additional Follow-up for Phone Call Additional follow up Details #1::        PT i.9 should allow procedure , but  kidney function needs to addressed after the procedure  as creat up to 2.4. Additional Follow-up by: Jeffrey Melnick MD,  September 14, 2008 5:31 PM    Additional Follow-up for Phone Call Additional follow up Details #2::    Dr.Hopper spoke with patient's wife. Patient is to keep medication the same and recheck Friday Follow-up by: Shonna Chock,  September 14, 2008 5:35 PM

## 2010-02-17 NOTE — Progress Notes (Signed)
Summary: WHAT'S NEXT STEP  Phone Note Call from Patient Call back at Home Phone [phone] Call back at Work Phone [phone]   Caller: Patient Reason for Call: Talk to Nurse, Lab or Test Results Summary of Call: HAD TESTING DONE, WHAT'S THE NEXT STEP. Initial call taken by: Lorne Skeens,  August 10, 2008 11:11 AM  Follow-up for Phone Call        talked with patient- told him Dr Johney Frame  reviewed echo done 08/05/08 and OK to proceed with ablation-pt states he would like to  schedule for 08/25/08-pt aware Tresa Endo not here  today-will forward to Anselm Pancoast and ask her to follow-up with patient 08/11/08 Katina Dung, RN, BSN  August 10, 2008 12:05 PM   Additional Follow-up for Phone Call Additional follow up Details #1::        sch for 08/25/08 @ 7:30 TEE sch for 08/24/08 @ 11:00 pt aware and will have labs drawn 08/19/08 @ 12:30 Dennis Bast, RN, BSN  August 11, 2008 9:21 AM

## 2010-02-17 NOTE — Letter (Signed)
Summary: [facility]: Immunoassay Fecal Occult Blood (iFOB) Order [facility]  [address] [place], [place] [address]   Phone: [phone]  Fax: [phone]      [facility]: Immunoassay Fecal Occult Blood (iFOB) Order Form   May 03, 2009 MRN: [id number]   TESHAWN MOAN 1945-11-17   Physicican Name:  Jarold Motto  Diagnosis Code:  V76.51      Ashok Cordia RN

## 2010-02-17 NOTE — Assessment & Plan Note (Signed)
Summary: SUGICAL CLEARANCE  HIP SURGERY.CBS   Vital Signs:  Patient Profile:   65 Years Old Male Weight:      209.50 pounds Pulse rate:   60 / minute Pulse rhythm:   regular BP sitting:   112 / 56  (left arm) Cuff size:   large  Pt. in pain?   no  Vitals Entered By: Wendall Stade (July 31, 2006 2:43 PM)                Chief Complaint:  surgical clearance.  History of Present Illness: patient stopped all supplements and his coumadin on 07/28/2006 due to the instructions on his surgical papers but also stopped coumadin without a doctor's permission. On coumadin for PAF; "I can tell when I'm having fib". He has come off coumadin for dental extractions as per Cardiology.  Current Allergies (reviewed today): No known allergies  Updated/Current Medications (including changes made in today's visit):  ACTOS 45 MG  TABS (PIOGLITAZONE HCL) 1 by mouth qd METFORMIN HCL 1000 MG  TABS (METFORMIN HCL) 1bid TRICOR 145 MG  TABS (FENOFIBRATE)  GLIMEPIRIDE 2 MG  TABS (GLIMEPIRIDE) 1/2 q am * ALLERGY SHOTS  HYDROCHLOROTHIAZIDE 25 MG  TABS (HYDROCHLOROTHIAZIDE) 1 by mouth qd ATENOLOL 50 MG  TABS (ATENOLOL) 1 by mouth qd FLECAINIDE ACETATE 100 MG  TABS (FLECAINIDE ACETATE) 1 and 1/2 tab qam and 1 tab qpm QUINAPRIL HCL 40 MG  TABS (QUINAPRIL HCL) 1 by mouth qd COUMADIN 5 MG  SOLR (WARFARIN SODIUM) patient stopped   Past Medical History:    renal calculus OP 2005  Past Surgical History:    PAF ; S/P ablation ?2001; cardioversion X3   Family History:    Father: DM,CVA    Mother: lung CA    Siblings: bro testicular CA   Risk Factors:  Tobacco use:  quit    Year quit:  1974 Alcohol use:  yes    Type:  occa Exercise:  no   Review of Systems  General      Denies chills, fatigue, fever, loss of appetite, malaise, sleep disorder, sweats, weakness, and weight loss.  Eyes      neg exam 12/07  CV      Complains of shortness of breath with exertion.      Denies bluish  discoloration of lips or nails, chest pain or discomfort, difficulty breathing at night, difficulty breathing while lying down, fainting, fatigue, leg cramps with exertion, lightheadness, near fainting, palpitations, swelling of feet, swelling of hands, and weight gain.      occa DOE with yardwork; decreased CVE due to hip  Resp      Denies cough and shortness of breath.  GI      Denies abdominal pain, bloody stools, change in bowel habits, constipation, dark tarry stools, diarrhea, excessive appetite, gas, hemorrhoids, indigestion, loss of appetite, nausea, and vomiting.  GU      Denies decreased libido, dysuria, erectile dysfunction, hematuria, incontinence, nocturia, urinary frequency, and urinary hesitancy.  MS      See HPI      Complains of low back pain.      1 1/2 month ago sciatic pain with numbness & tingling & dropped foot ; better with Chiropractry  Derm      Denies changes in color of skin, changes in nail beds, dryness, excessive perspiration, flushing, hair loss, itching, lesion(s), poor wound healing, and rash.  Neuro      Complains of disturbances in coordination, numbness, poor balance, and  tingling.      Denies falling down, memory loss, and sensation of room spinning.      see MS ROS; hip has affected balance  Psych      Denies anxiety, depression, easily angered, easily tearful, and irritability.  Endo      Denies cold intolerance, excessive hunger, excessive thirst, excessive urination, heat intolerance, polyuria, and weight change.  Heme      Denies abnormal bruising and bleeding.   Physical Exam  General:     Well-developed,well-nourished,in no acute distress; alert,appropriate and cooperative throughout examination Neck:     thyroid small, no nodules Lungs:     Normal respiratory effort, chest expands symmetrically. Lungs are clear to auscultation, no crackles or wheezes. Heart:     S4 Abdomen:     Bowel sounds positive,abdomen soft and non-tender  without masses, organomegaly or hernias noted. Msk:     pain with R hip rotation; crepitus knees Pulses:     R and L carotid,radial,femoral,dorsalis pedis and posterior tibial pulses are full and equal bilaterally Neurologic:     DTRs 0-1/2+; foot drop on L;sensation intact to light touch normal over feet.   Skin:     Intact without suspicious lesions or rashes Psych:     normally interactive, good eye contact, not anxious appearing, and not depressed appearing.      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 1 by mouth qd    Flecainide Acetate 100 Mg Tabs (Flecainide acetate) .Marland Kitchen... 1 and 1/2 tab qam and 1 tab qpm    Coumadin 5 Mg Solr (Warfarin sodium) .Marland Kitchen... Patient stopped   Problem # 4:  DM (ICD-250.00)  His updated medication list for this problem includes:    Actos 45 Mg Tabs (Pioglitazone hcl) .Marland Kitchen... 1 by mouth qd    Metformin Hcl 1000 Mg Tabs (Metformin hcl) .Marland KitchenMarland KitchenMarland KitchenMarland Kitchen 1bid    Glimepiride 2 Mg Tabs (Glimepiride) .Marland Kitchen... 1/2 q am    Quinapril Hcl 40 Mg Tabs (Quinapril hcl) .Marland Kitchen... 1 by mouth qd   Problem # 5:  HYPERLIPIDEMIA NEC/NOS (ICD-272.4)  His updated medication list for this problem includes:    Tricor 145 Mg Tabs (Fenofibrate)   Medications Added to Medication List This Visit: 1)  Allergy Shots  2)  Hydrochlorothiazide 25 Mg Tabs (Hydrochlorothiazide) .Marland Kitchen.. 1 by mouth qd 3)  Atenolol 50 Mg Tabs (Atenolol) .Marland Kitchen.. 1 by mouth qd 4)  Flecainide Acetate 100 Mg Tabs (Flecainide acetate) .Marland Kitchen.. 1 and 1/2 tab qam and 1 tab qpm 5)  Quinapril Hcl 40 Mg Tabs (Quinapril hcl) .Marland Kitchen.. 1 by mouth qd 6)  Coumadin 5 Mg Solr (Warfarin sodium) .... Patient stopped  Other Orders: EKG w/ Interpretation  (93000)   Patient Instructions: 1)  Resume Coumadin(branded) ASAP

## 2010-02-17 NOTE — Letter (Signed)
Summary: Results Follow-up Letter  [facility] [place]  [address] [place], [place] [address]   Phone: [phone]  Fax: [phone]    03/05/2007        [name] [address] [place], [place]  [address]  Dear Mr. Job,   The following are the results of your recent test(s):  Test     Result     Pap Smear    Normal_______  Not Normal_____       Comments: _________________________________________________________ Cholesterol LDL(Bad cholesterol):          Your goal is less than:         HDL (Good cholesterol):        Your goal is more than: _________________________________________________________ Other Tests:   _________________________________________________________  Please call for an appointment Or _Please see attached.________________________________________________________ _________________________________________________________ _________________________________________________________  Sincerely,  Ardyth Man [place] at [facility]

## 2010-02-17 NOTE — Progress Notes (Signed)
Summary: STATUS OF FAXED  Phone Note Call from Patient Call back at Home Phone [phone] Call back at Work Phone [phone]   Caller: Patient Reason for Call: Talk to Doctor Summary of Call: PT FAXED A MEMO EARLY LAST WEEK.  JUST CHECK ON STATUS . Initial call taken by: Lorne Skeens,  May 28, 2008 2:36 PM  Follow-up for Phone Call        Spoke with patient.  Tikosyn admission tentavily scheduled for Friday 06-12-08.  BMET, Magnesium Pt/INR to [facility] 5-[facility].  Order sent to their office.  Follow-up by: Gypsy Balsam RN BSN,  May 28, 2008 4:42 PM

## 2010-02-17 NOTE — Progress Notes (Signed)
Summary: 90 DAY PRESCRIPTION RENEWALS TO BE PICKED UP on your ledge  Phone Note Call from Patient Call back at [facility] = NANCY = [phone]   Caller: Spouse Summary of Call: SPOUSE BROUGHT IN PATIENT'S PRESCRIPTION RENEWAL INFO FOR **90 DAY***  PRESCRIPTIONS--PLEASE WRITE PRESCRIPTIONS AND HOLD FOR PICKUP------   !!! DO NOT FAX !!!!!!!  ALSO NEEDS PRESCRIPTION FOR VOLVO TRUCK NURSE TO CHECK COUMIDIN LEVELS  WILL TAKE PAPERWORK IN PLASTIC SLEEVE BACK TO CHEMIRA'S DESK Initial call taken by: Jerolyn Shin,  July 16, 2007 2:16 PM  Follow-up for Phone Call        on ledge for hop................Marland KitchenDoristine Devoid  July 16, 2007 5:13 PM   Additional Follow-up for Phone Call Additional follow up Details #1::        med refills OK until 04/2008, but A1c &urine  microalbumin need to be repeated in 10/09 (250.00). All other labs should be repeated in 4/10. If he wants  me to manage the coumadin ; it must be done here.When the PT is drawn elsewhere there is a delay in reporting which may be critical. The results here are are rapid & eliminate possible errors (see next page) Additional Follow-up by: [name] MD,  July 19, 2007 6:52 AM    Additional Follow-up for Phone Call Additional follow up Details #2::    The [facility] Clinic may feel comfortable with test done elsewhere , but I have experienced too many problems(data stuck in pile of routine  reports,data misread or misunderstood  over  phone,delayed FAX results) when there is not face to face rather than adding  an intermediary step in this testing.  I apologize for any inconvenience , but this is not a risk free matter such as maintenance B12 or allergy shots.He can call the Cou[facility] Clinic on [facility] to see if they are willing to supervise remote testing. Follow-up by: [name] MD,  July 19, 2007 6:59 AM       Appended Document: 67 DAY PRESCRIPTION RENEWALS TO BE PICKED UP-lmom    Phone Note Outgoing Call   Call placed by:  Doristine Devoid,  July 22, 2007 10:16 AM Call placed to: Patient Summary of Call: left message on machine   Follow-up for Phone Call        spoke with wife informed that prescription are ready for pick and for authorization they need to contact coumadin clinic ..............Marland KitchenDoristine Devoid  July 22, 2007 12:19 PM     New/Updated Medications: TRICOR 145 MG  TABS (FENOFIBRATE) take one tablet daily   Prescriptions: COUMADIN 5 MG  TABS (WARFARIN SODIUM) take as directed  #120 x 0   Entered by:   Doristine Devoid   Authorized by:   [name] MD   Signed by:   Doristine Devoid on 07/22/2007   Method used:   Historical   RxID:   [id number] TRICOR 145 MG  TABS (FENOFIBRATE) take one tablet daily  #90 x 2   Entered by:   Doristine Devoid   Authorized by:   [name] MD   Signed by:   Doristine Devoid on 07/22/2007   Method used:   Historical   RxID:   [id number] METFORMIN HCL 1000 MG  TABS (METFORMIN HCL) 1bid  #180 x 2   Entered by:   Doristine Devoid   Authorized by:   [name] MD   Signed by:   Doristine Devoid on 07/22/2007   Method used:   Historical  RxID:   [id number] ACTOS 45 MG  TABS (PIOGLITAZONE HCL) 1 by mouth qd  #90 x 2   Entered by:   Doristine Devoid   Authorized by:   [name] MD   Signed by:   Doristine Devoid on 07/22/2007   Method used:   Historical   RxID:   [id number]      Appended Document: 90 DAY PRESCRIPTION RENEWALS TO BE PICKED UP on your ledge CALLED PT , LEFT MESSAGE TO CALL BACK....  WIFE CALLED BACK, SAYS SHE CALL DR [name]..SAYS SHE WILL CHECK TO SEE IF THEY WILL ALLOW PT TO HAVE HIS P/T CHECK AT WORK OR BY SPECIUM...TOLD PT DR Erik Burkett WILL CHECK IT AND MONITOR THE P/T.Marland KitchenMarland KitchenHE WILL NOT HAVE SOMEONE ELSE TO CHECK.Marland KitchenMarland KitchenAND HE MONITOR THE RESULTS...PT SAYS SHE UNDERSTOOD.... CDAVIS 07-18-2007

## 2010-02-17 NOTE — Progress Notes (Signed)
Summary: back in afib   Phone Note Outgoing Call Call back at Work Phone [phone]   Call placed by: Gypsy Balsam RN BSN,  May 04, 2008 11:44 AM Call placed to: Patient Summary of Call: Called patient to follow-up on message left by PA on call over the weekend that patient was back in afib.  Pt went back into afib Friday evening, been out of rhythm since.  Per Dr. Odessa Fleming last note, may need to conisder changing anti-arrythmics or pulmonary vein isolation.  Patient out of town until Thursday.  Appt made to discuss with Dr. [name] Friday 4-23 at 9AM. Pt aware and agrees with plan.   Initial call taken by: Gypsy Balsam RN BSN,  May 04, 2008 11:45 AM

## 2010-02-17 NOTE — Progress Notes (Signed)
Summary: wants to talk with kelly  Phone Note Call from Patient   Caller: Patient Reason for Call: Talk to Nurse Summary of Call: pt wants to talk with kelly-wouldn't elaborate- [phone] Initial call taken by: Glynda Jaeger,  August 12, 2009 2:52 PM  Follow-up for Phone Call        called pt and could be related to stress at work.  He is going to come in and see Dr Johney Frame in a few weeks, toward the end of the month Dennis Bast, RN, BSN  August 12, 2009 5:03 PM

## 2010-02-17 NOTE — Progress Notes (Signed)
Summary: PLANS TO MANAGE AFIB?  Phone Note Call from Patient Call back at [phone]   Caller: Patient Reason for Call: Talk to Nurse Summary of Call: PATIENT HAS NOT HEARD BACK CONCERNING FURTHER OPTIONS FOR AFIB. Initial call taken by: Burnard Leigh,  May 21, 2008 10:04 AM  Follow-up for Phone Call        Dr. [name] spoke with patient.  Patient to decide whether or not to go on Tikosyn or Dronederone while awaiting appt for afib ablation with Dr. Johney Frame.  Patient to call back with decision Follow-up by: Gypsy Balsam RN BSN,  May 22, 2008 9:38 AM

## 2010-02-17 NOTE — Progress Notes (Signed)
Summary: scripts ready to be picked up  Phone Note Call from Patient Call back at Home Phone [phone]   Caller: Spouse Reason for Call: Refill Medication Summary of Call: DR HOPPER PATIENT  PATIENT'S WIFE DROPPED OFF [employer] WITH PRESCRIPTION RENEWAL--I THOUGHT IT CONTAINED TWO REFILLS--IT IS FOR FIVE REFILLS FOR:  1)  METFORMIN 1000   QTY = 180    TAKE 1 TABLET BY MOUTH TWICE DAILY WITH 2 LARGEST MEALS 2)  TRICOR 145 MG TABLETS  QTY = 90   TAKE 1 TABLET BY MOUTH EVERY MORNING 3)  ACTOS 45 MG TABLETS   QTY = 90   TAKE 1 TABLET BY MOUTH EVERY DAY 4)  GLIMEPIRIDE 2 MG TABLETS   QTY = 45    TAKE ONE-HALF TABLET EVERY MORNING 5)  COUMADIN 5 MG TABLETS (PEACH)   QTY =  90      TAKE AS DIRECTED     ****  PLEASE ASKS THAT DR INITIAL "DISPENSE AS WRITTEN"  ALSO ASKS IF WE HAVE ANY MORE COUPONS FOR ACTOS ADVANTAGE PROGRAM--COUPONS THAT PATIENT HAD-- HAVE EXPIRED ON 10/16/06  PLEASE CALL [phone] WHEN READY FOR PICKUP, ALSO CAN PICKUP COUPONS TOO  WILL PLACE IN PLASTIC SLEEVE ANDTAKE TO KATHY'S DESK Initial call taken by: Jerolyn Shin,  March 21, 2007 6:45 PM  Follow-up for Phone Call        called and left a message for Mrs. Stark Bray to call me.  I need to talk to her....................................................................Marland KitchenWendall Stade  March 22, 2007 8:19 AM patient's wife called back and he does get protimes done at work and they let us know the results....................................................................Marland KitchenWendall Stade  March 22, 2007 5:38 PM     New/Updated Medications: COUMADIN 5 MG  TABS (WARFARIN SODIUM) take as directed   Prescriptions: COUMADIN 5 MG  TABS (WARFARIN SODIUM) take as directed  #120 x 1   Entered by:   Wendall Stade   Authorized by:   [name] MD   Signed by:   Wendall Stade on 03/22/2007   Method used:   Print then Give to Patient   RxID:   [id number] GLIMEPIRIDE 2 MG  TABS (GLIMEPIRIDE) 1/2 q am  #90 x 1   Entered  by:   Wendall Stade   Authorized by:   [name] MD   Signed by:   Wendall Stade on 03/22/2007   Method used:   Print then Give to Patient   RxID:   [id number] TRICOR 145 MG  TABS (FENOFIBRATE)   #90 x 1   Entered by:   Wendall Stade   Authorized by:   [name] MD   Signed by:   Wendall Stade on 03/22/2007   Method used:   Print then Give to Patient   RxID:   [id number] METFORMIN HCL 1000 MG  TABS (METFORMIN HCL) 1bid  #180 x 1   Entered by:   Wendall Stade   Authorized by:   [name] MD   Signed by:   Wendall Stade on 03/22/2007   Method used:   Print then Give to Patient   RxID:   [id number] ACTOS 45 MG  TABS (PIOGLITAZONE HCL) 1 by mouth qd  #90 x 1   Entered by:   Wendall Stade   Authorized by:   [name] MD   Signed by:   Wendall Stade on 03/22/2007   Method used:   Print then Give to Patient   RxID:   [id number]

## 2010-02-17 NOTE — Progress Notes (Signed)
Summary: Verbal Order for INR  Phone Note From Other Clinic   Caller: phycican asst Call For: Kingsley Plan Request: Talk with Provider Summary of Call: Dr.Hopper  Kingsley Plan from [name] need a verbal order to draw an INR on [employer] [phone] Initial call taken by: Vanessa Swaz[place],  August 09, 2007 9:21 AM  Follow-up for Phone Call        CALLED AND SPOKE W/ ANGELA...TOLD HER DR HOPPER CONCERN...SAYS SHE UNDERSTOOD.SAYS PT WILL BE OUT OF TOWN FOR 2 WEEKS AND SHE IS CONCERNED HE WILL NOT BE CHECK FOR THAT LIMIT OF TIME...TOLD HER I WOULD DISCUSS W/ DR HOPPER AND CALL HER BACK.Daine Gip  August 09, 2007 9:48 AM Follow-up by: Daine Gip,  August 09, 2007 9:48 AM  Additional Follow-up for Phone Call Additional follow up Details #1::        SP W/ DR HOPPER...SAYS HE WILL NOT GIVE AN ORDER FOR PTS INR.Marland KitchenSAYS HE HAS MADE A DECISION AND WILL NOT CHANGE... I SPOKE W/ PTS WIFE AND EXPLAINED TO HER DR HOPPER'S CONCERNS.Marland KitchenWIFE SAYS SHE WOULD HV PTS CARDIOLGIST HANDLE...CALLED ANGELA-VOLVO LEFT MESSAGE-TOLD HER WE CAN CHECK PTS INR TODAY...REQUEST THAT SHE CALL ME BACK.Marland KitchenMarland KitchenCDAVIS Additional Follow-up by: Daine Gip,  August 09, 2007 10:02 AM    Additional Follow-up for Phone Call Additional follow up Details #2::    CALLED ANGELA BACK...SAYS SHE DID RECD MY MESSAGE.Marland KitchenSAYS SHE DID NOT KNOW IF THE PT WILL BE ABLE LTO COME TODAY, SAYS HE MAY NOT HAVE TIME BECAUSE OF HIS FLIGHT TIME... (PLANE) ...SAYS SHE WILL MAKE SURE HE GETS THE MESSAGE.Marland KitchenMarland KitchenMarland KitchenDaine Gip  August 09, 2007 10:53 AM Follow-up by: Daine Gip,  August 09, 2007 10:53 AM

## 2010-02-17 NOTE — Progress Notes (Signed)
Summary: CALLING TO GET LABS FAX TO DR.HOPPER OFFICE  Phone Note Call from Patient Call back at [facility] Phone [phone]   Caller: Patient Summary of Call: PT CALLING ABOUT A PROCEDURE THAT THE PT IS HAVING AND NEED SOME LABS FAX TO DR.HOPPER OFFICE Initial call taken by: Judie Grieve,  September 10, 2008 2:54 PM  Follow-up for Phone Call        order faxed Dennis Bast, RN, BSN  September 11, 2008 9:15 AM

## 2010-02-17 NOTE — Progress Notes (Signed)
Summary: PT/INR RESULTS  Phone Note Outgoing Call Call back at Work Phone [phone]   Call placed by: Shonna Chock,  Jun 01, 2009 10:07 AM Call placed to: Patient Details for Reason: PT/INR Results  Summary of Call: no change , 1 month  Chrae Malloy  Jun 01, 2009 10:11 AM

## 2010-02-17 NOTE — Miscellaneous (Signed)
  Clinical Lists Changes  Medications: Changed medication from RYTHMOL SR 325 MG XR12H-CAP (PROPAFENONE HCL) take one tablet by mouth two times a day to TIKOSYN 500 MCG CAPS (DOFETILIDE) one tab by mouth two times a day

## 2010-02-17 NOTE — Progress Notes (Signed)
Summary: pt call to say thank you  Phone Note Call from Patient   Caller: Patient Reason for Call: Talk to Nurse Summary of Call: pt just wanted to tell you thank you and let you know he got your message Initial call taken by: Omer Jack,  November 05, 2008 9:46 AM

## 2010-02-17 NOTE — Progress Notes (Signed)
Summary: pt is out of rhythm  Phone Note Call from Patient Call back at Work Phone [phone]   Caller: Patient Reason for Call: Talk to Nurse Summary of Call: pt had acardio version last week and he has gone back out of rhythm on saturday Initial call taken by: Omer Jack,  July 22, 2008 3:20 PM  Follow-up for Phone Call        D/W Dr. [name], pt to increase Amiodarone to 400mg  twice daily until appt with Dr. Johney Frame on 7-16.  RX called to [facility] on [address].  Pt aware and agrees with plan Follow-up by: Gypsy Balsam RN BSN,  July 22, 2008 4:17 PM

## 2010-02-17 NOTE — Progress Notes (Signed)
Summary: rapid heart beats after ablation two days ago  Phone Note Call from Patient   Caller: Patient [phone] Reason for Call: Talk to Nurse Summary of Call: pt calling re ablation two days ago, heart beating rapidly Initial call taken by: Glynda Jaeger,  November 05, 2009 9:12 AM  Follow-up for Phone Call        spoke with pt He is noticing this while resting and not as much with activity.  He is going to go for a walk and see if this helps.  I moved the patients appointment up to 11/25/09.  pt to call back if not improved Dennis Bast, RN, BSN  November 05, 2009 10:17 AM

## 2010-02-17 NOTE — Progress Notes (Signed)
Summary: order - dr Rigoberto Repass  Phone Note Call from Patient Call back at [phone]   Caller: Spouse Summary of Call: patient has PT  drawn at work ( he works at [employer]) which is sent to [employer] & they send result to dr Meelah Tallo & patient is  given instruction by nurse --- patient needs order for the nurse to draw   - patient's wife will pick order up  Initial call taken by: Okey Regal [place],  July 22, 2007 1:38 PM  Follow-up for Phone Call        Dr.Quantasia Stegner Please Advise, I heard you mention that patient's should have Pt checks here if they would like for you to address.  Follow-up by: Shonna Chock,  July 22, 2007 1:59 PM  Additional Follow-up for Phone Call Additional follow up Details #1::        As per my note ; I will monitor PT s done in house only due to increased error &/or delay with such remote draws Additional Follow-up by: [name] MD,  July 22, 2007 6:07 PM         Appended Document: order - dr Sheletha Bow CALLED PT WIFE...TOLD HER PER DR Kadee Philyaw HE WOULD PREFER IF PT HAS A P/T CHECK, THAT IT IS PREFORMED IN THE OUR OFFICE AND RESULTS, TREATMENT AND INTRUCTION GIVEN TO HIM BY THE PHYSICIANS AT [facility].  Durelle Zepeda WILL NOT APPROVE FOR SOMEONE ELSE TO DRAW THE BLOOD AND SEND RESULTS TO HIM....WIFE SAYS SHE UNDERSTOOD, THAT SHE WILL CALL DR [name] AND SEE IF HE WILL APPROVE,  AND SEND AN ORDER TO VOLVO NURSE TO DRAW PROTIME.AND AGREE TO SEND THE RESULTS TO DR [name]..SAYS SHE WILL WORK W/ DR [name] REGARDING  THIS MATTER.  CDAVIS. 07-24-2007

## 2010-02-17 NOTE — Letter (Signed)
Summary: Cardioversion/TEE Instructions  Architectural technologist, Main Office  [address]   [place], [place] [address]   Phone: [phone]  Fax: [phone]    Cardioversion / TEE Cardioversion Instructions  You are scheduled for a Cardioversion / TEE Cardioversion on Thursday, July 16, 2008  with Dr. [name].   Please arrive at the [facility] at [facility] on the day of your procedure.  1)   DIET:    May have clear liquid breakfast, then nothing to eat or drink after 8 a.m.       Clear liquids include:  water, broth, Sprite, Ginger Ale, black coffee, tea (no sugar),      cranberry / grape / apple juice, jello (not red), popsicle from clear juices (not red).   2)   MAKE SURE YOU TAKE YOUR COUMADIN.  3)   A)   DO NOT TAKE these medications before your procedure:      Hold diabetes medications morning of procedure.      ___________________________________________________________________     ___________________________________________________________________  B)   YOU MAY TAKE ALL of your remaining medications with a small amount of water.    C)   START NEW medications:   Amiodarone 400mg  1 tablet twice daily for 2 weeks then 1 tablet daily.  4)  Must have a responsible person to drive you home.  5)   Bring a current list of your medications and current insurance cards.   * Special Note:  Every effort is made to have your procedure done on time. Occasionally there are emergencies that present themselves at the hospital that may cause delays. Please be patient if a delay does occur.  * If you have any questions after you get home, please call the office at [phone].

## 2010-02-17 NOTE — Assessment & Plan Note (Signed)
Summary: PT CHECK///PH  Nurse Visit   Vital Signs:  Patient profile:   65 year old male Weight:      216.2 pounds Pulse rate:   72 / minute BP sitting:   114 / 68  (left arm) Cuff size:   large  Vitals Entered By: Shonna Chock (August 17, 2008 4:27 PM)  Impression & Recommendations:  Problem # 1:  ATRIAL FIBRILLATION, CHRONIC (ICD-427.31)  His updated medication list for this problem includes:    Atenolol 50 Mg Tabs (Atenolol) .Marland Kitchen... 1 by mouth qd    Coumadin 5 Mg Tabs (Warfarin sodium) .Marland Kitchen... Take as directed    Amiodarone Hcl 400 Mg Tabs (Amiodarone hcl) .Marland Kitchen... Take one tablet by mouth twice a day for 2 weeks, then take one tablet by mouth daily  Orders: Protime (84132GM)  Problem # 2:  ENCOUNTER FOR THERAPEUTIC DRUG MONITORING (ICD-V58.83)  Orders: Protime (01027OZ)  Complete Medication List: 1)  Actos 45 Mg Tabs (Pioglitazone hcl) .Marland Kitchen.. 1 by mouth qd 2)  Metformin Hcl 1000 Mg Tabs (Metformin hcl) .Marland Kitchen.. 1bid 3)  Tricor 145 Mg Tabs (Fenofibrate) .... Take one tablet daily 4)  Glimepiride 2 Mg Tabs (Glimepiride) .... 1/2 q am 5)  Allergy Shots  6)  Atenolol 50 Mg Tabs (Atenolol) .Marland Kitchen.. 1 by mouth qd 7)  Quinapril Hcl 40 Mg Tabs (Quinapril hcl) .Marland Kitchen.. 1 by mouth qd 8)  Coumadin 5 Mg Tabs (Warfarin sodium) .... Take as directed 9)  Onetouch Ultra Test Strp (Glucose blood) .... Test two times a day 10)  Amiodarone Hcl 400 Mg Tabs (Amiodarone hcl) .... Take one tablet by mouth twice a day for 2 weeks, then take one tablet by mouth daily   Patient Instructions: 1)  as per written instructions  CC: PT CHECK   Allergies: 1)  ! * No Ivp Dye Allergy 2)  ! * No Latex Allergy 3)  ! * No Shellfish Allergy Laboratory Results   Blood Tests      INR: 2.1   (Normal Range: 0.88-1.12   Therap INR: 2.0-3.5)    Orders Added: 1)  Est. Patient Level I [36644] 2)  Protime [03474QV]   ANTICOAGULATION RECORD PREVIOUS REGIMEN & LAB RESULTS   Previous INR:  5.6 on   08/14/2008 Previous Coumadin Dose(mg):  SKIPPED YESTERDAY on  08/14/2008 Previous Regimen:  HOLD COUMADIN ALL WEEKEND on  08/14/2008 Previous Coagulation Comments:  Patient taking 2.5 mg daily, patient to continue current regimen and return to clinic in one month. on  07/13/2008  NEW REGIMEN & LAB RESULTS Current INR: 2.1 Current Coumadin Dose(mg): NO MEDS SINCE 08/14/2008 Regimen: 2.5MG  DAILY  Provider: Johonna Binette      Repeat testing in: FRIDAY 08/21/2008 MEDICATIONS ACTOS 45 MG  TABS (PIOGLITAZONE HCL) 1 by mouth qd METFORMIN HCL 1000 MG  TABS (METFORMIN HCL) 1bid TRICOR 145 MG  TABS (FENOFIBRATE) take one tablet daily GLIMEPIRIDE 2 MG  TABS (GLIMEPIRIDE) 1/2 q am * ALLERGY SHOTS  ATENOLOL 50 MG  TABS (ATENOLOL) 1 by mouth qd QUINAPRIL HCL 40 MG  TABS (QUINAPRIL HCL) 1 by mouth qd COUMADIN 5 MG  TABS (WARFARIN SODIUM) take as directed ONETOUCH ULTRA TEST  STRP (GLUCOSE BLOOD) test two times a day AMIODARONE HCL 400 MG TABS (AMIODARONE HCL) Take one tablet by mouth twice a day for 2 weeks, then take one tablet by mouth daily

## 2010-02-17 NOTE — Letter (Signed)
Summary: Cardioversion Orders and Instructions  Cardioversion Orders and Instructions   Imported By: Kassie Mends 01/12/2009 09:48:19  _____________________________________________________________________  External Attachment:    Type:   Image     Comment:   External Document

## 2010-02-17 NOTE — Progress Notes (Signed)
Summary: PHONE  Phone Note Call from Patient   Caller: Patient Summary of Call: PATIENT HAS AN APPT ON SEPT 14,2010 HE WANTS TO KNOW IF HE NEEDS BLOOKWORK ON THAT DAY. HE COMING IN TO GET HIS MEDS ADJUSTED. CONTACT #[phone]  Follow-up for Phone Call        Spoke with patient, he has a pending lab appointment Sept 22, those labs are non-fasting, we will keep that appointment for now, if when he comes in on Sept 14 Dr.Hopper would like to check those labs we will cancle appointment for the 22. Patient ok'd Follow-up by: Shonna Chock,  September 25, 2008 9:03 AM

## 2010-02-17 NOTE — Assessment & Plan Note (Signed)
Summary: PT CHECK PER PT/RH.......  Nurse Visit   Vital Signs:  Patient profile:   65 year old male Weight:      224.6 pounds Pulse rate:   64 / minute BP sitting:   122 / 76  (left arm) Cuff size:   regular  Vitals Entered By: Shonna Chock (November 13, 2008 4:33 PM)  Impression & Recommendations:  Problem # 1:  ATRIAL FIBRILLATION, CHRONIC (ICD-427.31)  His updated medication list for this problem includes:    Coumadin 1 Mg Tabs (Warfarin sodium) .Marland Kitchen... Take 2 tablets by mouth once daily as directed    Amiodarone Hcl 200 Mg Tabs (Amiodarone hcl) .Marland Kitchen... 1 by mouth once daily    Atenolol 50 Mg Tabs (Atenolol) .Marland Kitchen... Take one tablet by mouth daily    Flecainide Acetate 100 Mg Tabs (Flecainide acetate) ..... One by mouth bid  Orders: Est. Patient Level I (04540) Protime (98119JY)  Problem # 2:  ENCOUNTER FOR THERAPEUTIC DRUG MONITORING (ICD-V58.83)  Orders: Est. Patient Level I (78295) Protime (62130QM)  Complete Medication List: 1)  Actos 45 Mg Tabs (Pioglitazone hcl) .Marland Kitchen.. 1 by mouth qd 2)  Glimepiride 2 Mg Tabs (Glimepiride) .... 1/2 q am 3)  Allergy Shots  4)  Quinapril Hcl 40 Mg Tabs (Quinapril hcl) .Marland Kitchen.. 1 by mouth qd 5)  Coumadin 1 Mg Tabs (Warfarin sodium) .... Take 2 tablets by mouth once daily as directed 6)  Onetouch Ultra Test Strp (Glucose blood) .... Test two times a day 7)  Amiodarone Hcl 200 Mg Tabs (Amiodarone hcl) .Marland Kitchen.. 1 by mouth once daily 8)  Zolpidem Tartrate 5 Mg Tabs (Zolpidem tartrate) .... Take 1 tab q 3rd night as needed 9)  Atenolol 50 Mg Tabs (Atenolol) .... Take one tablet by mouth daily 10)  Multivitamins Caps (Multiple vitamin) .... Take 2 daily 11)  Flecainide Acetate 100 Mg Tabs (Flecainide acetate) .... One by mouth bid   Patient Instructions: 1)  see written instructions  CC: PT CHECK   Allergies: 1)  ! * No Ivp Dye Allergy 2)  ! * No Latex Allergy 3)  ! * No Shellfish Allergy 4)  ! Clonazepam (Clonazepam) Laboratory Results     Blood Tests      INR: 1.7   (Normal Range: 0.88-1.12   Therap INR: 2.0-3.5)    Orders Added: 1)  Est. Patient Level I [57846] 2)  Protime [96295MW]   ANTICOAGULATION RECORD PREVIOUS REGIMEN & LAB RESULTS Anticoagulation Diagnosis:  Atrial fibrillation on  09/04/2008 Previous INR Goal Range:  2-3 on  09/15/2008 Previous INR:  2.3 ratio on  10/26/2008 Previous Coumadin Dose(mg):  M/W/F 2.5MG , 1.25 ALL OTHER DAYS on  10/16/2008 Previous Regimen:  5MG  TODAY, 2.5MG  DAILY EXCEPT 1.25 T/TH/SAT on  10/16/2008 Previous Coagulation Comments:  Patient taking 2.5 mg daily, patient to continue current regimen and return to clinic in one month. on  07/13/2008  NEW REGIMEN & LAB RESULTS Current INR: 1.7 Current Coumadin Dose(mg): 2.5MG  DAILY Regimen: 5MG  TODAY, THEN 5MG  ON WED, 2.5MG  ALL OTHER DAYS  Provider: Antonette Hendricks      Repeat testing in: 3 WEEKS MEDICATIONS ACTOS 45 MG  TABS (PIOGLITAZONE HCL) 1 by mouth qd GLIMEPIRIDE 2 MG  TABS (GLIMEPIRIDE) 1/2 q am * ALLERGY SHOTS  QUINAPRIL HCL 40 MG  TABS (QUINAPRIL HCL) 1 by mouth qd COUMADIN 1 MG TABS (WARFARIN SODIUM) Take 2 tablets by mouth once daily as directed [BMN] ONETOUCH ULTRA TEST  STRP (GLUCOSE BLOOD) test two times a day AMIODARONE  HCL 200 MG TABS (AMIODARONE HCL) 1 by mouth once daily ZOLPIDEM TARTRATE 5 MG TABS (ZOLPIDEM TARTRATE) take 1 tab q 3rd night as needed ATENOLOL 50 MG TABS (ATENOLOL) Take one tablet by mouth daily MULTIVITAMINS  CAPS (MULTIPLE VITAMIN) Take 2 daily FLECAINIDE ACETATE 100 MG TABS (FLECAINIDE ACETATE) one by mouth bid

## 2010-02-17 NOTE — Letter (Signed)
Summary: Paramedic   Imported By: Roderic Ovens 08/28/2008 12:40:37  _____________________________________________________________________  External Attachment:    Type:   Image     Comment:   External Document

## 2010-02-17 NOTE — Progress Notes (Signed)
Summary: directions on test strips  Phone Note Call from Patient Call back at Home Phone [phone]   Caller: Spouse Summary of Call: pt wife says they have not recieved test strips because pharmacy needs directions on strips informed wife we haven't recieved anything would like [place] to call pharmacy per Chemira pt test bid  ONETOUCH ULTRA TEST STRIPS [facility] mail [phone] Initial call taken by: Doristine Devoid,  September 18, 2007 9:05 AM    Additional Follow-up for Phone Call Additional follow up Details #2::    rx called into [facility] mail[facility] [phone] left msg for pt .Kandice Hams  September 18, 2007 12:38 PM  Follow-up by: Kandice Hams,  September 18, 2007 12:38 PM  New/Updated Medications: ONETOUCH ULTRA TEST  STRP (GLUCOSE BLOOD) test two times a day   Prescriptions: ONETOUCH ULTRA TEST  STRP (GLUCOSE BLOOD) test two times a day  #200 x 2   Entered by:   Kandice Hams   Authorized by:   [name] MD   Signed by:   Kandice Hams on 09/18/2007   Method used:   Telephoned to ...       [facility] (mail-order)             [place], [place]  [address]       Ph: [phone]       Fax:    RxID:   [id number]

## 2010-02-17 NOTE — Letter (Signed)
Summary: Cardioversion/TEE Instructions  Architectural technologist, Main Office  [address]   [place], [place] [address]   Phone: [phone]  Fax: [phone]     TEE Instructions  You are scheduled for a TEE on 08/24/08 with Dr. Tenny Craw   Please arrive at the [facility] at 9:00 a.m.Marland Kitchen on the day of your procedure.  1)   DIET:  A)   Nothing to eat or drink after midnight except your medications with a sip of water    2)   MAKE SURE YOU TAKE YOUR COUMADIN.  3)   A)   DO NOT TAKE these medications before your procedure: Glimepiride the morning of and hold your Metformin both 08/24/08 and 08/25/08        B)   YOU MAY TAKE ALL of your remaining medications with a small amount of water.         4)  Must have a responsible person to drive you home.  5)   Bring a current list of your medications and current insurance cards.   * Special Note:  Every effort is made to have your procedure done on time. Occasionally there are emergencies that present themselves at the hospital that may cause delays. Please be patient if a delay does occur.  * If you have any questions after you get home, please call the office at [phone].

## 2010-02-17 NOTE — Progress Notes (Signed)
Summary: RTN YOUR CALL  Phone Note Call from Patient Call back at Work Phone [phone]   Caller: Patient Reason for Call: Talk to Nurse, Talk to Doctor Summary of Call: PT RTNING YOUR CALL REGARDING MEDICATION Initial call taken by: Omer Jack,  December 07, 2009 11:14 AM  Follow-up for Phone Call        12/19 DCCV for afternoon if 12/20 DCCV midday from the last DCCV when pt was stuck for IV he mentoned that he was a hard stick and the IV was placed on top side of left hand.  He says it "hit a nerve" and now he is having nerve pain when he moves his wrist a certain way.  The IV was then placed in lft anticubital and another nusre had to come in to start. Patient states he is going to call and inform the hospital.(risk management) what happened.  Additional Follow-up for Phone Call Additional follow up Details #1::        Scheduled DCCV for 01/03/10 at 2pm   labs on 12/27/09 at 11:30 pt aware Dennis Bast, RN, BSN  December 08, 2009 2:23 PM

## 2010-02-17 NOTE — Progress Notes (Signed)
Summary:  cardioversion  Phone Note Call from Patient Call back at Work Phone [phone]   Caller: Patient Reason for Call: Talk to Nurse Summary of Call: pt had a  cardioversion 01/03/10 pt states 01/06/2010 it went out again.  Initial call taken by: Roe Coombs,  January 20, 2010 10:59 AM  Follow-up for Phone Call        Discussed with DrAllred to see him in the next few weeks  have him scheduled for 02/02/10 at 4:30  pt aware  Continue with his medications as prescribed Dennis Bast, RN, BSN  January 20, 2010 2:04 PM

## 2010-02-17 NOTE — Letter (Signed)
Summary: Results Follow up Letter  [facility] [place]  [address] [place], [place] [address]   Phone: [phone]  Fax: [phone]    08/31/2008 MRN: [id number]  [facility] [address] [place], [place]  [address]  Dear [name],  The following are the results of your recent test(s):  Test         Result    Pap Smear:        Normal _____  Not Normal _____ Comments: ______________________________________________________ Cholesterol: LDL(Bad cholesterol):         Your goal is less than:         HDL (Good cholesterol):       Your goal is more than: Comments:  ______________________________________________________ Mammogram:        Normal _____  Not Normal _____ Comments:  ___________________________________________________________________ Hemoccult:        Normal _____  Not normal _______ Comments:    _____________________________________________________________________ Other Tests: Please see attached labs done on 08/28/2008    We routinely do not discuss normal results over the telephone.  If you desire a copy of the results, or you have any questions about this information we can discuss them at your next office visit.   Sincerely,

## 2010-02-17 NOTE — Letter (Signed)
Summary: Cardioversion Instructions  Architectural technologist, Main Office  [address]   [place], [place] [address]   Phone: [phone]  Fax: [phone]     Cardioversion Instructions  You are scheduled for a Cardioversion  on 09/30/08 with Dr. Johney Frame   Please arrive at the [facility] at 10:00a.m. on the day of your procedure.  1)   DIET:  A)   Nothing to eat or drink after midnight except your medications with a sip of water.     2)   MAKE SURE YOU TAKE YOUR COUMADIN.  3)   A)   DO NOT TAKE these medications before your procedure: Actos and Glimepiride  B)   YOU MAY TAKE ALL of your remaining medications with a small amount of water.  4)  Must have a responsible person to drive you home.  5)   Bring a current list of your medications and current insurance cards.   * Special Note:  Every effort is made to have your procedure done on time. Occasionally there are emergencies that present themselves at the hospital that may cause delays. Please be patient if a delay does occur.  * If you have any questions after you get home, please call the office at [phone]. Anselm Pancoast

## 2010-02-17 NOTE — Medication Information (Signed)
Summary: Coumadin Clinic  Anticoagulant Therapy  Managed by: [name], PharmD, BCPS, CPP Referring MD: [name], MD PCP: [name] MD Supervising MD: Eden Emms MD, Theron Arista Indication 1: Atrial fibrillation Indication 2: Ablation pending Lab Used: [facility] of [facility]  Site: [address] INR POC 1.9 INR RANGE 2-3  Dietary changes: no    Health status changes: no    Bleeding/hemorrhagic complications: no    Recent/future hospitalizations: no    Any changes in medication regimen? no    Recent/future dental: no  Any missed doses?: no       Is patient compliant with meds? yes       Allergies: 1)  ! * No Ivp Dye Allergy 2)  ! * No Latex Allergy 3)  ! * No Shellfish Allergy  Anticoagulation Management History:      Positive risk factors for bleeding include presence of serious comorbidities.  Negative risk factors for bleeding include an age less than 81 years old and no history of CVA/TIA.  The bleeding index is 'intermediate risk'.  Positive CHADS2 values include History of HTN and History of Diabetes.  Negative CHADS2 values include Age > 48 years old and Prior Stroke/CVA/TIA.  His last INR was 1.6.  Anticoagulation responsible provider: Eden Emms MD, Theron Arista.  INR POC: 1.9.    Anticoagulation Management Assessment/Plan:      The patient's current anticoagulation dose is Coumadin 1 mg tabs: Take 2 tablets by mouth once daily as directed.  The next INR is due 09/18/2008.  Anticoagulation instructions were given to patient.  Results were reviewed/authorized by [name], PharmD, BCPS, CPP.  He was notified by [name] PharmD, BCPS, CPP.         Current Anticoagulation Instructions: INR 1.9  Take 3 tabs today then 2 tabs daily until 9/3 appt.    Will probably need 1 mg few days / week.

## 2010-02-17 NOTE — Letter (Signed)
Summary: [facility] Authorization   Imported By: Roderic Ovens 08/03/2008 12:17:13  _____________________________________________________________________  External Attachment:    Type:   Image     Comment:   External Document

## 2010-02-17 NOTE — Assessment & Plan Note (Signed)
Summary: [facility]      Allergies Added: NKDA  History of Present Illness: Mr. Tumminello is seen in followup for atrial fibrillation.  he was recently cardioverted initially thereafter reverted to atrial fibrillation. We increased his flecainide from 150/75-150 b.i.d. He was cardioverted again on Friday and is holding sinus rhythm.  Reviewing his old record he was last cathetered in 2005; he has not had an intercurrent evaluation of coronary perfusion or left ventricular function since then.  He continues to struggle with weight. He denies significant daytime somnolence or nocturnal snoring.  Current Medications (verified): 1)  Actos 45 Mg  Tabs (Pioglitazone Hcl) .Marland Kitchen.. 1 By Mouth Qd 2)  Metformin Hcl 1000 Mg  Tabs (Metformin Hcl) .Marland Kitchen.. 1bid 3)  Tricor 145 Mg  Tabs (Fenofibrate) .... Take One Tablet Daily 4)  Glimepiride 2 Mg  Tabs (Glimepiride) .... 1/2 Q Am 5)  Allergy Shots 6)  Hydrochlorothiazide 25 Mg  Tabs (Hydrochlorothiazide) .Marland Kitchen.. 1 By Mouth Qd 7)  Atenolol 50 Mg  Tabs (Atenolol) .Marland Kitchen.. 1 By Mouth Qd 8)  Flecainide Acetate 100 Mg  Tabs (Flecainide Acetate) .Marland Kitchen.. 1 and 1/2 Tab Qam and 1 Tab Qpm 9)  Quinapril Hcl 40 Mg  Tabs (Quinapril Hcl) .Marland Kitchen.. 1 By Mouth Qd 10)  Coumadin 5 Mg  Tabs (Warfarin Sodium) .... Take As Directed 11)  Onetouch Ultra Test  Strp (Glucose Blood) .... Test Two Times A Day  Allergies (verified): No Known Drug Allergies  Vital Signs:  Patient profile:   65 year old male Height:      69 inches Weight:      218.25 pounds BMI:     32.35 Pulse rate:   56 / minute Pulse rhythm:   regular BP sitting:   122 / 72  (left arm) Cuff size:   regular  Vitals Entered By: Judithe Modest CMA (April 28, 2008 4:35 PM)  Physical Exam  General:  Well developed, well nourished, in no acute distress. Moderate obesity Neck:  Neck supple, no JVD. No masses, thyromegaly or abnormal cervical nodes. Lungs:  Clear bilaterally to auscultation and percussion. Heart:   regular rate and rhythm without significant murmurs or gallops there is no peripheral edema Abdomen:  Bowel sounds positive; abdomen soft and non-tender   Msk:  Back normal, normal gait. Muscle strength and tone normal. Extremities:  No clubbing or cyanosis. Neurologic:  Alert and oriented x 3.   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 The current time Will plan to maintain him on his higher dose of flecainide. In the event that he has recurrences of atrial fibrillation we will need to consider alternative intravitreal therapy and/or ovary vein isolation. He has had brief discussions with Dr. Johney Frame already.  Given the use of a 1C antiarrhythmic drug and a prolonged interval since his last coronary perfusion evaluation, we will plan to undertake a Myoview stress test. He would prefer to do this with exercise. This is particularly important as his multiple cardiac risk factors. In the interim he will be continued to work on weight reduction dobutamine 10 his Coumadin therapy  Problem # 2:  HYPERTENSION (ICD-401.9) Well-controlled. His updated medication list for this problem includes:    Hydrochlorothiazide 25 Mg Tabs (Hydrochlorothiazide) .Marland Kitchen... 1 by mouth qd    Atenolol 50 Mg Tabs (Atenolol) .Marland Kitchen... 1 by mouth qd    Quinapril Hcl 40 Mg Tabs (Quinapril hcl) .Marland Kitchen... 1 by mouth qd  Patient Instructions: 1)  The patient's medications were refilled today he will follow up with Dr. [name] concerning complaints of muscle weakness. We'll see him again in 6 months time and will follow up with him following his Myoview scan.

## 2010-02-17 NOTE — Progress Notes (Signed)
Summary: PROCEDURE  Phone Note Call from Patient Call back at Home Phone [phone]   Caller: Patient Summary of Call: PT WASNT TO TALK TO NURSE CONCERNING A PROCEDURE BEING SCHEDULED. Initial call taken by: Edman Circle,  September 07, 2008 1:00 PM  Follow-up for Phone Call        spoke w pt set for 09/22/08

## 2010-02-17 NOTE — Assessment & Plan Note (Signed)
Summary: discuss personal issues/ pt check/kdc   Vital Signs:  Patient profile:   65 year old male Weight:      235.2 pounds Pulse rate:   80 / minute Resp:     15 per minute BP sitting:   140 / 84  (left arm) Cuff size:   large  Vitals Entered By: Shonna Chock (February 09, 2009 4:14 PM) CC: PT check and personal issues Comments REVIEWED MED LIST, PATIENT AGREED DOSE AND INSTRUCTION CORRECT      Primary Care Provider:  [name] MD  CC:  PT check and personal issues.  History of Present Illness: [name]  despite Cardioversion X 5 & ablation X 2; intermittent chest pain in context of returning to work. His job is stressful involving  Cytogeneticist. He has been back @ work since 01/20/2009. BP ranges dramatically @ work: 108/60-158/80. He notes definite difference when on vacation.He is considering applying for disability. He notes some intention tremor @ table. He never took Clonazepam.  Allergies: 1)  ! * No Ivp Dye Allergy 2)  ! * No Latex Allergy 3)  ! * No Shellfish Allergy 4)  ! Clonazepam (Clonazepam)  Review of Systems Psych:  Complains of anxiety, easily angered, and irritability; denies depression, easily tearful, and panic attacks. Endo:  No constellation of headache ,flushing,chest pain & diarrhea.  Physical Exam  General:  well-nourished,in no acute distress; alert,appropriate and cooperative throughout examination; clinically he is calm as he discusses these issues Eyes:  No corneal or conjunctival inflammation noted.No lid lag.Perrla. Neck:  No deformities, masses, or tenderness noted. Heart:  Normal rate and regular rhythm. S1 and S2 normal without gallop, murmur, click, rub. S4 Neurologic:  alert & oriented X3 and DTRs symmetrical and normal.   No tremor  Psych:  memory intact for recent and remote, normally interactive, good eye contact, not anxious appearing, and not depressed appearing.     Impression & Recommendations:  Problem #  1:  ATRIAL FIBRILLATION, PAROXYSMAL (ICD-427.31)  His updated medication list for this problem includes:    Coumadin 5 Mg Tabs (Warfarin sodium) .Marland Kitchen... Daw uad    Flecainide Acetate 150 Mg Tabs (Flecainide acetate) .Marland Kitchen... 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 Once daily as directed  Problem # 4:  ENCOUNTER FOR THERAPEUTIC DRUG MONITORING (ICD-V58.83)  Orders: Venipuncture (36644)  Complete Medication List: 1)  Actos 45 Mg Tabs (Pioglitazone hcl) .Marland Kitchen.. 1 by mouth qd 2)  Glimepiride 2 Mg Tabs (Glimepiride) .... 1/2 q am 3)  Allergy Shots  .... Twice per month 4)  Quinapril Hcl 40 Mg Tabs (Quinapril hcl) .Marland Kitchen.. 1 by mouth qd 5)  Coumadin 5 Mg Tabs (Warfarin sodium) .... Daw uad 6)  Onetouch Ultra Test Strp (Glucose blood) .... Test two times a day 7)  Multivitamins Caps (Multiple vitamin) .... Take 2 daily 8)  Flecainide Acetate 150 Mg Tabs (Flecainide acetate) .... 1/2 tab two times a day 9)  Hydrochlorothiazide 25 Mg Tabs (Hydrochlorothiazide) .... Take one tablet by mouth daily. 10)  Fluoxetine Hcl 10 Mg Caps (Fluoxetine hcl) .... Once daily as directed  Patient Instructions: 1)  Collect 24 hr urine for catecholamines & metanephrines. Have blood  drawn for  free T4, free T3, TSH, A1c  when you bring back the 24 hr urine collection.(427.31,333.1, 300.00) Prescriptions: COUMADIN 5 MG TABS (WARFARIN SODIUM) DAW UAD  #90 x 1   Entered and Authorized by:   [name] MD   Signed by:   [name] MD on 02/09/2009   Method used:   Print then Give to Patient   RxID:   [id number] FLUOXETINE HCL 10 MG CAPS (FLUOXETINE HCL) once daily as directed  #30 x 5   Entered  and Authorized by:   [name] MD   Signed by:   [name] MD on 02/09/2009   Method used:   Print then Give to Patient   RxID:   [id number]    ANTICOAGULATION RECORD PREVIOUS REGIMEN & LAB RESULTS Anticoagulation Diagnosis:  Atrial fibrillation on  09/04/2008 Previous INR Goal Range:  2-3 on  09/15/2008 Previous INR:  2.5 ratio on  12/25/2008 Previous Coumadin Dose(mg):  2.5MG  DAILY on  11/13/2008 Previous Regimen:  5MG  TODAY, THEN 5MG  ON WED, 2.5MG  ALL OTHER DAYS on  11/13/2008 Previous Coagulation Comments:  Patient taking 2.5 mg daily, patient to continue current regimen and return to clinic in one month. on  07/13/2008  NEW REGIMEN & LAB RESULTS Current INR: 1.9 Current Coumadin Dose(mg): 5mg  M/W/F, 2.5mg  all other days Regimen: 5MG  TODAY, THEN 5MG  ON WED, 2.5MG  ALL OTHER DAYS  (no change)  Provider: Shakesha Soltau Other Comments: We are checking our electronic machine with the lab to see if machine and lab report are close in range. We will address PT when lab value comes back.   MEDICATIONS ACTOS 45 MG  TABS (PIOGLITAZONE HCL) 1 by mouth qd GLIMEPIRIDE 2 MG  TABS (GLIMEPIRIDE) 1/2 q am * ALLERGY SHOTS twice per month QUINAPRIL HCL 40 MG  TABS (QUINAPRIL HCL) 1 by mouth qd COUMADIN 5 MG TABS (WARFARIN SODIUM) DAW UAD ONETOUCH ULTRA TEST  STRP (GLUCOSE BLOOD) test two times a day MULTIVITAMINS  CAPS (MULTIPLE VITAMIN) Take 2 daily FLECAINIDE ACETATE 150 MG TABS (FLECAINIDE ACETATE) 1/2 tab two times a day HYDROCHLOROTHIAZIDE 25 MG TABS (HYDROCHLOROTHIAZIDE) Take one tablet by mouth daily. FLUOXETINE HCL 10 MG CAPS (FLUOXETINE HCL) once daily as directed   Anticoagulation Visit Questionnaire      Coumadin dose missed/changed:  No      Abnormal Bleeding Symptoms:  No   Any diet changes including alcohol intake, vegetables or greens since the last visit:  No Any illnesses or hospitalizations since the last visit:  No Any signs of clotting since the last  visit (including chest discomfort, dizziness, shortness of breath, arm tingling, slurred speech, swelling or redness in leg):  No

## 2010-02-17 NOTE — Progress Notes (Signed)
Summary: PT WOULD LIKE TO SPEAK TO MARY HAS BEEN PLACED ON ANTIBIOTICS  Phone Note Call from Patient Call back at [phone]   Caller: Patient Summary of Call: PATIENT WOULD LIKE TO SPEAK TO MARY P.  HAS QUESTIONS CONCERNING ANTIBIOTICS. Initial call taken by: Burnard Leigh,  June 18, 2008 10:57 AM  Follow-up for Phone Call        Attempted to call.  No answer. Weston Brass PharmD  June 19, 2008 4:47 PM Attempted to reach pt. , no answer. lmom for him to call us back if we could still be of any assistance.  Follow-up by: Bethena Midget, RN, BSN,  June 23, 2008 4:53 PM

## 2010-02-17 NOTE — Progress Notes (Signed)
Summary: Lab Results  Phone Note Outgoing Call Call back at Work Phone [phone]   Call placed by: Shonna Chock CMA,  October 29, 2009 8:54 AM Call placed to: Patient Summary of Call: Spoke with patient (copy of report mailed)  Diabetes control is only fair; please see me after Dr Johney Frame releases you.These labs will be routed to him for response to the PT/INR. He will determine PT/INR goal. Levester Fresh CMA  October 29, 2009 8:55 AM

## 2010-02-17 NOTE — Letter (Signed)
Summary: ELectrophysiology/Ablation Procedure Instructions  [facility], Main Office  [address]   [place], [place] [address]   Phone: [phone]  Fax: [phone]     Electrophysiology/Ablation Procedure Instructions    You are scheduled for a(n) afib ablation  on 11/02/09 at 7:30am with Dr. Johney Frame.  1.  Please come to the Short Stay Center at [facility] at 5:30am on the day of your procedure.  2.  Come prepared to stay overnight.   Please bring your insurance cards and a list of your medications.  3.  Come to the [facility] office on 10/26/09 at ____________ for lab work.   You do not have to be fasting.  4.  Do not have anything to eat or drink after midnight the night before your procedure.  5.  Do NOT take these medications for the day of procedure unless otherwise instructed:  Glimepiride  All of your remaining medications may be taken with a small amount of water.    * Occasionally, EP studies and ablations can become lengthy.  Please make your family aware of this before your procedure starts.  Average time ranges from 2-8 hours for EP studies/ablations.  Your physician will locate your family after the procedure with the results.  * If you have any questions after you get home, please call the office at [phone]. Anselm Pancoast  TEE--with Dr Tenny Craw on 11/01/09 at 10:00am  Please check in at Short Stay Center at 9:00 am Nothing to eat or drink after midnight the night prior to your procedure  CT--10/27/09 at 1:00pm Sharonda to give you instructions

## 2010-02-17 NOTE — Progress Notes (Signed)
Summary: Lab Results  Phone Note Outgoing Call Call back at Home Phone [phone]   Call placed by: Shonna Chock,  August 31, 2008 2:14 PM Call placed to: Patient Summary of Call: D/W PATIENT:  Creatinine slightly better ; but Metformin must be held. Critical will be to keep BP controlled (AVERAGE < 135/85)  & Diabetes controlled (A1c < 7%, safer is < 6.5%). Stay hydrated ,drink up to 40 oz of water /day. Hopp  PATIENT OK'D INFORMATION. COPY TO BE MAILED./Chrae [facility]  August 31, 2008 2:18 PM    Follow-up for Phone Call       Follow-up by: Shonna Chock,  August 31, 2008 2:15 PM

## 2010-02-17 NOTE — Letter (Signed)
Summary: Results Follow up Letter  [facility] [place]  [address] [place], [place] [address]   Phone: [phone]  Fax: [phone]    11/24/2008 MRN: [id number]  [facility] [address] [place], [place]  [address]  Dear [name],  The following are the results of your recent test(s):  Test         Result    Pap Smear:        Normal _____  Not Normal _____ Comments: ______________________________________________________ Cholesterol: LDL(Bad cholesterol):         Your goal is less than:         HDL (Good cholesterol):       Your goal is more than: Comments:  ______________________________________________________ Mammogram:        Normal _____  Not Normal _____ Comments:  ___________________________________________________________________ Hemoccult:        Normal _____  Not normal _______ Comments:    _____________________________________________________________________ Other Tests: Please see attached PT/INR results from 11/23/2008    We routinely do not discuss normal results over the telephone.  If you desire a copy of the results, or you have any questions about this information we can discuss them at your next office visit.   Sincerely,

## 2010-02-17 NOTE — Miscellaneous (Signed)
Summary: Orders Update  Clinical Lists Changes  Orders: Added new Referral order of CT Scan  (CT Scan) - Signed 

## 2010-02-17 NOTE — Assessment & Plan Note (Signed)
Summary: pt/cbs   Vital Signs:  Patient profile:   65 year old male Pulse rate:   76 / minute Resp:     14 per minute BP sitting:   120 / 68  (left arm) Cuff size:   large  Vitals Entered By: Shonna Chock (August 14, 2008 3:59 PM) CC: PT CHECK   Primary Care Provider:  [name] MD  CC:  PT CHECK.  History of Present Illness: PT/INR must be in range prior to ablation. He denies any med or diet changes to explain high INR except for Amiodarone.  Allergies: 1)  ! * No Ivp Dye Allergy 2)  ! * No Latex Allergy 3)  ! * No Shellfish Allergy   Impression & Recommendations:  Problem # 1:  ATRIAL FIBRILLATION, CHRONIC (ICD-427.31)  His updated medication list for this problem includes:    Atenolol 50 Mg Tabs (Atenolol) .Marland Kitchen... 1 by mouth qd    Coumadin 5 Mg Tabs (Warfarin sodium) .Marland Kitchen... Take as directed    Amiodarone Hcl 400 Mg Tabs (Amiodarone hcl) .Marland Kitchen... Take one tablet by mouth twice a day for 2 weeks, then take one tablet by mouth daily  Orders: Protime ([address]UE)  Problem # 2:  ENCOUNTER FOR THERAPEUTIC DRUG MONITORING (ICD-V58.83)  Orders: Protime ([id number])  Complete Medication List: 1)  Actos 45 Mg Tabs (Pioglitazone hcl) .Marland Kitchen.. 1 by mouth qd 2)  Metformin Hcl 1000 Mg Tabs (Metformin hcl) .Marland Kitchen.. 1bid 3)  Tricor 145 Mg Tabs (Fenofibrate) .... Take one tablet daily 4)  Glimepiride 2 Mg Tabs (Glimepiride) .... 1/2 q am 5)  Allergy Shots  6)  Atenolol 50 Mg Tabs (Atenolol) .Marland Kitchen.. 1 by mouth qd 7)  Quinapril Hcl 40 Mg Tabs (Quinapril hcl) .Marland Kitchen.. 1 by mouth qd 8)  Coumadin 5 Mg Tabs (Warfarin sodium) .... Take as directed 9)  Onetouch Ultra Test Strp (Glucose blood) .... Test two times a day 10)  Amiodarone Hcl 400 Mg Tabs (Amiodarone hcl) .... Take one tablet by mouth twice a day for 2 weeks, then take one tablet by mouth daily  Patient Instructions: 1)  No coumadin Fri, Sat, or Sun . PT/INR Monday. Please feel free to discuss having PT/INRs monitored by Coumadin Clinic based  on results phoned in by [facility] staff; I do not do this for the reasons you & I discussed   ANTICOAGULATION RECORD PREVIOUS REGIMEN & LAB RESULTS   Previous INR:  2.9 on  07/13/2008 Previous Coumadin Dose(mg):  5MG -M/F, 2.5MG  ALL OTHER DAYS on  07/06/2008 Previous Regimen:  NONE TODAY, THEN 2.5MG  DAILY on  07/06/2008 Previous Coagulation Comments:  Patient taking 2.5 mg daily, patient to continue current regimen and return to clinic in one month. on  07/13/2008  NEW REGIMEN & LAB RESULTS Current INR: 5.6 Current Coumadin Dose(mg): SKIPPED YESTERDAY Regimen: HOLD COUMADIN ALL WEEKEND  Provider: Mylinda Brook      Repeat testing in: [facility] 08/17/2008 Other Comments: PATIENT WAS 6.3 YESTERDAY  MEDICATIONS ACTOS 45 MG  TABS (PIOGLITAZONE HCL) 1 by mouth qd METFORMIN HCL 1000 MG  TABS (METFORMIN HCL) 1bid TRICOR 145 MG  TABS (FENOFIBRATE) take one tablet daily GLIMEPIRIDE 2 MG  TABS (GLIMEPIRIDE) 1/2 q am * ALLERGY SHOTS  ATENOLOL 50 MG  TABS (ATENOLOL) 1 by mouth qd QUINAPRIL HCL 40 MG  TABS (QUINAPRIL HCL) 1 by mouth qd COUMADIN 5 MG  TABS (WARFARIN SODIUM) take as directed ONETOUCH ULTRA TEST  STRP (GLUCOSE BLOOD) test two times a day AMIODARONE HCL 400 MG  TABS (AMIODARONE HCL) Take one tablet by mouth twice a day for 2 weeks, then take one tablet by mouth daily

## 2010-02-17 NOTE — Medication Information (Signed)
Summary: Med Monitor Program Report/[facility]  Med Monitor Program Report/[facility]   Imported By: Lanelle Bal 04/20/2009 09:57:23  _____________________________________________________________________  External Attachment:    Type:   Image     Comment:   External Document

## 2010-02-17 NOTE — Progress Notes (Signed)
Summary: Lab results  Phone Note Outgoing Call Call back at Work Phone [phone]   Call placed by: Shonna Chock,  February 25, 2009 9:56 AM Call placed to: Patient Summary of Call: Spoke with patient:  Thyroid function tests normal; repeat in 6 months to verify stability as TSH now high normal & free T3 low normal. Diabetes control good; recheck A1c 6 months.PT slightly low ; will need 1/2 pill increase once weekly. Chrae will verify regimen. Hopp  Patient thinks he may have missed a pill with last cycle(not sure). Current dose: (M/F 5MG  2.5MG  ALL OTHER DAYS). Patient will now change to M/W/F 5mg  and 2.5mg  all other days and follow-up in a couple of weeks.   Chrae Malloy  February 25, 2009 9:56 AM

## 2010-02-17 NOTE — Miscellaneous (Signed)
Summary: Outpatient Coinsurance Notice  Outpatient Coinsurance Notice   Imported By: Marylou Mccoy 01/26/2009 15:45:13  _____________________________________________________________________  External Attachment:    Type:   Image     Comment:   External Document

## 2010-02-17 NOTE — Progress Notes (Signed)
Summary: test results  Phone Note Call from Patient Call back at Work Phone [phone]   Summary of Call: Patient called for test results. Please advise. Initial call taken by: Lucious Groves CMA,  December 17, 2009 3:37 PM  Follow-up for Phone Call        I called Kathlene November with results; he is feeling better Follow-up by: [name] MD,  December 17, 2009 5:10 PM

## 2010-02-17 NOTE — Progress Notes (Signed)
Summary: INSTRUCTIONS FOR ADMISSION 5-28  Phone Note Call from Patient Call back at Home Phone [phone] Call back at [phone] cell   Caller: Spouse Reason for Call: Talk to Nurse Summary of Call: PATIENT TO BE ADMITTED FRIDAY 5-28.  HAS NOT BEEN GIVEN INSTRUCTIONS.  PLEASE CALL THIS MORNING WITH INFORMATION. Initial call taken by: Burnard Leigh,  Jun 11, 2008 8:07 AM    [name], pt's wife, aware admitting will call pt when a bed is available.

## 2010-02-17 NOTE — Medication Information (Signed)
Summary: Coumadin Clinic  Anticoagulant Therapy  Managed by: Inactive Referring MD: [name], MD PCP: [name] MD Supervising MD: Johney Frame MD, Fayrene Fearing Indication 1: Atrial fibrillation Indication 2: Ablation pending Lab Used: [facility] [place] Site: [address] INR RANGE 2-3          Comments: Per Tresa Endo, RN pt is going to have coumadin monitored by Dr Frederik Pear office.   Allergies: 1)  ! * No Ivp Dye Allergy 2)  ! * No Latex Allergy 3)  ! * No Shellfish Allergy 4)  ! Clonazepam (Clonazepam)  Anticoagulation Management History:      Positive risk factors for bleeding include presence of serious comorbidities.  Negative risk factors for bleeding include an age less than 63 years old and no history of CVA/TIA.  The bleeding index is 'intermediate risk'.  Positive CHADS2 values include History of HTN and History of Diabetes.  Negative CHADS2 values include Age > 65 years old and Prior Stroke/CVA/TIA.  His last INR was 3.3.  Anticoagulation responsible provider: Heidi Maclin MD, Fayrene Fearing.  Exp: 10/2009.    Anticoagulation Management Assessment/Plan:      The patient's current anticoagulation dose is Coumadin 1 mg tabs: Take 2 tablets by mouth once daily as directed.  The next INR is due 09/18/2008.  Anticoagulation instructions were given to patient.  Results were reviewed/authorized by Inactive.         Prior Anticoagulation Instructions: INR 3.2   Continue current dose.  Tresa Endo, RN to call with instructions

## 2010-02-17 NOTE — Progress Notes (Signed)
Summary: EKG RESULTS/REPORT OF MEDS  Phone Note Call from Patient Call back at [phone]   Caller: Patient Reason for Call: Talk to Nurse Summary of Call: RESULTS OF EKG DONE YESTERDAY.  WOULD ALSO LIKE A REPORT ON HIS MEDICATIONS.  Initial call taken by: Burnard Leigh,  June 30, 2008 12:27 PM  Follow-up for Phone Call        Called patient and left message on machine to call.  Per Dr. [name], pt should have DCCV on Multaq Follow-up by: Gypsy Balsam RN BSN,  June 30, 2008 5:14 PM  Additional Follow-up for Phone Call Additional follow up Details #1::        please call spouse this morning.   Additional Follow-up by: Burnard Leigh,  July 01, 2008 8:11 AM    Additional Follow-up for Phone Call Additional follow up Details #2::    Pt currently off all anti-arrythmics, couldn't take Multaq because of problems breathing (making asthma worse).  Heide Spark for daughter is July 10th, pt is leaving July 8th, will be gone through the 17th.  Will D/W Dr, [name] plan for restoring SR before leaving. Gypsy Balsam RN BSN  July 01, 2008 8:51 AM  Per Dr. [name], start on Amiodarone 400mg  twice daily for 2 weeks, then have DCCV on July 1st.  Appt made with Dr. Johney Frame for July 16th to evaluate for afib ablation. Wife aware and agrees with plan. Follow-up by: Gypsy Balsam RN BSN,  July 01, 2008 9:36 AM  New/Updated Medications: AMIODARONE HCL 400 MG TABS (AMIODARONE HCL) Take one tablet by mouth twice a day for 2 weeks, then take one tablet by mouth daily   Prescriptions: AMIODARONE HCL 400 MG TABS (AMIODARONE HCL) Take one tablet by mouth twice a day for 2 weeks, then take one tablet by mouth daily  #42 x 1   Entered by:   Optometrist BSN   Authorized by:   Nathen May, MD, [facility]   Signed by:   Gypsy Balsam RN BSN on 07/01/2008   Method used:   Electronically to        [id number] (retail)       [address]       [place], [place]   [address]       Ph: [phone]       Fax: [phone]   RxID:   [id number]

## 2010-02-17 NOTE — Progress Notes (Signed)
Summary: Glimepiride to [facility] (new patient for [facility])  Phone Note Refill Request Message from:  Patient's wife [name] (by fax) on January 28, 2010 8:40 AM  Refills Requested: Medication #1:  GLIMEPIRIDE 2 MG  TABS 1/2 q am now has [facility]  (will be transferring all prescriptions from [facility] to [facility])---needs [place] to send this to [facility] asap because he is almost out of this medication--will take [facility] form to Chrae  Next Appointment Scheduled: none Initial call taken by: Jerolyn Shin,  January 28, 2010 8:47 AM  Follow-up for Phone Call        Left message on voicemail informing patient rx sent in and if he runs out before he received mail order we can send rx to local pharmacy for a 30 day supply Follow-up by: Shonna Chock CMA,  January 28, 2010 3:03 PM    New/Updated Medications: GLIMEPIRIDE 2 MG  TABS (GLIMEPIRIDE) 1/2 q am **LABS DUE 04/2010** Prescriptions: GLIMEPIRIDE 2 MG  TABS (GLIMEPIRIDE) 1/2 q am **LABS DUE 04/2010**  #45 x 1   Entered by:   Shonna Chock CMA   Authorized by:   [name] MD   Signed by:   Shonna Chock CMA on 01/28/2010   Method used:   Faxed to ...       [facility]* (retail)             ,          Ph: [phone]       Fax: [phone]   RxID:   [id number]

## 2010-02-17 NOTE — Progress Notes (Signed)
Summary: has date to set up procedure  Phone Note Call from Patient Call back at Home Phone [phone] Call back at Work Phone [phone]   Caller: Patient Reason for Call: Talk to Nurse Summary of Call: pt has date in mind to schedule procedures.  Initial call taken by: Lorne Skeens,  September 30, 2009 11:01 AM  Follow-up for Phone Call        wants to schedule CT on 10/13 or 10/14.  Will give to [facility] to schedule Dennis Bast, RN, BSN  September 30, 2009 1:02 PM

## 2010-02-17 NOTE — Assessment & Plan Note (Signed)
Summary: NUR--PT CHECK//PH  Nurse Visit   Vital Signs:  Patient Profile:   65 Years Old Male CC:      PT CHECK Weight:      217.8 pounds Pulse rate:   69 / minute  Vitals Entered By: Shonna Chock (October 21, 2007 4:25 PM)                 Prior Medications: ACTOS 45 MG  TABS (PIOGLITAZONE HCL) 1 by mouth qd METFORMIN HCL 1000 MG  TABS (METFORMIN HCL) 1bid TRICOR 145 MG  TABS (FENOFIBRATE) take one tablet daily GLIMEPIRIDE 2 MG  TABS (GLIMEPIRIDE) 1/2 q am ALLERGY SHOTS ()  HYDROCHLOROTHIAZIDE 25 MG  TABS (HYDROCHLOROTHIAZIDE) 1 by mouth qd ATENOLOL 50 MG  TABS (ATENOLOL) 1 by mouth qd FLECAINIDE ACETATE 100 MG  TABS (FLECAINIDE ACETATE) 1 and 1/2 tab qam and 1 tab qpm QUINAPRIL HCL 40 MG  TABS (QUINAPRIL HCL) 1 by mouth qd COUMADIN 5 MG  TABS (WARFARIN SODIUM) take as directed ONETOUCH ULTRA TEST  STRP (GLUCOSE BLOOD) test two times a day Current Allergies: No known allergies  Laboratory Results   Blood Tests      INR: 3.0   (Normal Range: 0.88-1.12   Therap INR: 2.0-3.5)      Orders Added: 1)  Est. Patient Level I [65784] 2)  Protime [69629BM]     Impression & Recommendations:  Problem # 1:  PAROXYSMAL ATRIAL FIBRILLATION (ICD-427.31)  His updated medication list for this problem includes:    Atenolol 50 Mg Tabs (Atenolol) .Marland Kitchen... 1 by mouth qd    Flecainide Acetate 100 Mg Tabs (Flecainide acetate) .Marland Kitchen... 1 and 1/2 tab qam and 1 tab qpm    Coumadin 5 Mg Tabs (Warfarin sodium) .Marland Kitchen... Take as directed  Orders: Protime (84132GM)   Problem # 2:  AFTERCARE, LONG-TERM USE, ANTICOAGULANTS (ICD-V58.61)  Orders: Protime (01027OZ)   Complete Medication List: 1)  Actos 45 Mg Tabs (Pioglitazone hcl) .Marland Kitchen.. 1 by mouth qd 2)  Metformin Hcl 1000 Mg Tabs (Metformin hcl) .Marland Kitchen.. 1bid 3)  Tricor 145 Mg Tabs (Fenofibrate) .... Take one tablet daily 4)  Glimepiride 2 Mg Tabs (Glimepiride) .... 1/2 q am 5)  Allergy Shots  6)  Hydrochlorothiazide 25 Mg Tabs  (Hydrochlorothiazide) .Marland Kitchen.. 1 by mouth qd 7)  Atenolol 50 Mg Tabs (Atenolol) .Marland Kitchen.. 1 by mouth qd 8)  Flecainide Acetate 100 Mg Tabs (Flecainide acetate) .Marland Kitchen.. 1 and 1/2 tab qam and 1 tab qpm 9)  Quinapril Hcl 40 Mg Tabs (Quinapril hcl) .Marland Kitchen.. 1 by mouth qd 10)  Coumadin 5 Mg Tabs (Warfarin sodium) .... Take as directed 11)  Onetouch Ultra Test Strp (Glucose blood) .... Test two times a day   Patient Instructions: 1)  see printed instructions  ]  ANTICOAGULATION RECORD PREVIOUS REGIMEN & LAB RESULTS   Previous INR:  2.9 RATIO on  09/06/2007  Previous Regimen:  no change on  10/31/2006  NEW REGIMEN & LAB RESULTS Current INR: 3.0 Current Coumadin Dose(mg): 5MG  TAB-5MG  M,W,F AND 2.5MG  ALL OTHER DAYS Regimen: no change  (no change)  Provider: Lavayah Vita      Repeat testing in: 4 WEEKS Other Comments: 2.9 ON 09/06/2007  MEDICATIONS ACTOS 45 MG  TABS (PIOGLITAZONE HCL) 1 by mouth qd METFORMIN HCL 1000 MG  TABS (METFORMIN HCL) 1bid TRICOR 145 MG  TABS (FENOFIBRATE) take one tablet daily GLIMEPIRIDE 2 MG  TABS (GLIMEPIRIDE) 1/2 q am * ALLERGY SHOTS  HYDROCHLOROTHIAZIDE 25 MG  TABS (HYDROCHLOROTHIAZIDE) 1 by mouth  qd ATENOLOL 50 MG  TABS (ATENOLOL) 1 by mouth qd FLECAINIDE ACETATE 100 MG  TABS (FLECAINIDE ACETATE) 1 and 1/2 tab qam and 1 tab qpm QUINAPRIL HCL 40 MG  TABS (QUINAPRIL HCL) 1 by mouth qd COUMADIN 5 MG  TABS (WARFARIN SODIUM) take as directed ONETOUCH ULTRA TEST  STRP (GLUCOSE BLOOD) test two times a day   Anticoagulation Visit Questionnaire      Coumadin dose missed/changed:  No      Abnormal Bleeding Symptoms:  No   Any diet changes including alcohol intake, vegetables or greens since the last visit:  Yes Any illnesses or hospitalizations since the last visit:  No Any signs of clotting since the last visit (including chest discomfort, dizziness, shortness of breath, arm tingling, slurred speech, swelling or redness in leg):  No

## 2010-02-17 NOTE — Assessment & Plan Note (Signed)
Summary: pt check/kdc  Nurse Visit   Vital Signs:  Patient profile:   65 year old male Weight:      225.6 pounds Pulse rate:   64 / minute BP sitting:   124 / 78  (left arm) Cuff size:   large  Vitals Entered By: Shonna Chock (October 16, 2008 4:39 PM)  Impression & Recommendations:  Problem # 1:  ATRIAL FIBRILLATION, CHRONIC (ICD-427.31)  His updated medication list for this problem includes:    Coumadin 1 Mg Tabs (Warfarin sodium) .Marland Kitchen... Take 2 tablets by mouth once daily as directed    Amiodarone Hcl 200 Mg Tabs (Amiodarone hcl) .Marland Kitchen... 1 by mouth once daily  Orders: Est. Patient Level I ([address]) Protime (60454UJ)  Problem # 2:  ENCOUNTER FOR THERAPEUTIC DRUG MONITORING (ICD-V58.83)  Orders: Est. Patient Level I (81191) Protime (47829FA)  Complete Medication List: 1)  Actos 45 Mg Tabs (Pioglitazone hcl) .Marland Kitchen.. 1 by mouth qd 2)  Glimepiride 2 Mg Tabs (Glimepiride) .... 1/2 q am 3)  Allergy Shots  4)  Quinapril Hcl 40 Mg Tabs (Quinapril hcl) .Marland Kitchen.. 1 by mouth qd 5)  Coumadin 1 Mg Tabs (Warfarin sodium) .... Take 2 tablets by mouth once daily as directed 6)  Onetouch Ultra Test Strp (Glucose blood) .... Test two times a day 7)  Amiodarone Hcl 200 Mg Tabs (Amiodarone hcl) .Marland Kitchen.. 1 by mouth once daily 8)  Zolpidem Tartrate 5 Mg Tabs (Zolpidem tartrate) .... Take 1 tab q 3rd night as needed   Patient Instructions: 1)  see written instructions  CC: PT CHECK Comments REVIEWED MED LIST, PATIENT AGREED DOSE AND INSTRUCTION CORRECT    Allergies: 1)  ! * No Ivp Dye Allergy 2)  ! * No Latex Allergy 3)  ! * No Shellfish Allergy 4)  ! Clonazepam (Clonazepam) Laboratory Results   Blood Tests      INR: 1.5   (Normal Range: 0.88-1.12   Therap INR: 2.0-3.5)    Orders Added: 1)  Est. Patient Level I [21308] 2)  Protime [[id number]]   ANTICOAGULATION RECORD PREVIOUS REGIMEN & LAB RESULTS Anticoagulation Diagnosis:  Atrial fibrillation on  09/04/2008 Previous INR Goal Range:   2-3 on  09/15/2008 Previous INR:  3.3 on  09/29/2008 Previous Coumadin Dose(mg):  M/F 1MG , ALL OTHERS 2MG  on  09/29/2008 Previous Regimen:  1mg  daily except 2 mg on tues, thurs, sat on  08/28/2008 Previous Coagulation Comments:  Patient taking 2.5 mg daily, patient to continue current regimen and return to clinic in one month. on  07/13/2008  NEW REGIMEN & LAB RESULTS Current INR: 1.5 Current Coumadin Dose(mg): M/W/F 2.5MG , 1.25 ALL OTHER DAYS Regimen: 5MG  TODAY, 2.5MG  DAILY EXCEPT 1.25 T/TH/SAT  Provider: Omni Dunsworth      Repeat testing in: 3 WEEKS MEDICATIONS ACTOS 45 MG  TABS (PIOGLITAZONE HCL) 1 by mouth qd GLIMEPIRIDE 2 MG  TABS (GLIMEPIRIDE) 1/2 q am * ALLERGY SHOTS  QUINAPRIL HCL 40 MG  TABS (QUINAPRIL HCL) 1 by mouth qd COUMADIN 1 MG TABS (WARFARIN SODIUM) Take 2 tablets by mouth once daily as directed [BMN] ONETOUCH ULTRA TEST  STRP (GLUCOSE BLOOD) test two times a day AMIODARONE HCL 200 MG TABS (AMIODARONE HCL) 1 by mouth once daily ZOLPIDEM TARTRATE 5 MG TABS (ZOLPIDEM TARTRATE) take 1 tab q 3rd night as needed   Anticoagulation Visit Questionnaire      Coumadin dose missed/changed:  No      Abnormal Bleeding Symptoms:  No   Any diet changes including alcohol  intake, vegetables or greens since the last visit:  No Any illnesses or hospitalizations since the last visit:  No Any signs of clotting since the last visit (including chest discomfort, dizziness, shortness of breath, arm tingling, slurred speech, swelling or redness in leg):  No

## 2010-02-17 NOTE — Progress Notes (Signed)
Summary: [name]*  wants to set abalation  Phone Note Call from Patient Call back at [phone]   Caller: Spouse Reason for Call: Talk to Nurse Summary of Call: per spouse call needs top set up ablation Initial call taken by: Omer Jack,  August 31, 2008 3:21 PM  Follow-up for Phone Call        Called patient and left message on machine Gypsy Balsam RN BSN  September 01, 2008 4:36 PM  Called patient and left message on machine [employer] RN BSN  September 02, 2008 11:49 AM   Additional Follow-up for Phone Call Additional follow up Details #1::        Called patient and left message on machine for wife with information per her request.  AFib ablation scheduled with Dr Johney Frame for Tuesday, September 7th.  Labs on 09-15-08.  Instruction sheet mailed to patient.  Additional Follow-up by: Gypsy Balsam RN BSN,  September 04, 2008 9:57 AM

## 2010-02-17 NOTE — Letter (Signed)
Summary: Primary Care Consult Scheduled Letter  [facility] [place]  [address] [place], [place] [address]   Phone: [phone]  Fax: [phone]      04/25/2007 MRN: [id number]  [facility] [address] [place], [place]  [address]    Dear [name],      We have scheduled an appointment for you.  At the recommendation of Dr.Hopper, we have scheduled you a consult with Dr. Jarold Motto on April 30, 2007 at 9:15.  Their phone number is [phone].  If this appointment day and time is not convenient for you, please feel free to call the office of the doctor you are being referred to at the number listed above and reschedule the appointment.     It is important for you to keep your scheduled appointments. We are here to make sure you are given good patient care. If yu have questions or you have made changes to your appointment, please notify us at  [phone], ask for Tiffany.    Thank you,  Patient Care Coordinator [facility]

## 2010-02-17 NOTE — Assessment & Plan Note (Signed)
Summary: new eval for afib ablation.amber   Visit Type:  Initial Consult Referring Provider:  Berton Mount, MD Primary Provider:  [name] MD  CC:  a-fib ablation.  History of Present Illness: [name] is a pleasant 65 yo WM with a history of persistent afib, HTN, and DM who presents for consultation regarding therapies for afib.  He reports initially being diagnosed with atrial fibrillation and atrial flutter in 1999.  He underwent catheter ablation for atrial flutter 1999.  He reports doing well initially.  He then developed increasing frequency and duration of atrial fibrillation.  His episodes were initially controlled with metoprolol and flecainide.  He subsequently failed medical therapy with flecainide and was placed on Tikosyn.  He maintained sinus with tikosyn for only several hours before returning to afib.  He developed wheezing with Multaq and could not tolerate this medicine.  He subsequently has been treated with Amiodoraone since June 2010 but has required cardioversion most recently 07/16/08.  He estimates that he returned to afib 48 hours afterwards.   During afib, he reports symptoms of fatigue, SOB, palpitations, orthostatic dizziness, and decreased exercise tolerance.  He is tolerating coumadin without difficulties.  Current Medications (verified): 1)  Actos 45 Mg  Tabs (Pioglitazone Hcl) .Marland Kitchen.. 1 By Mouth Qd 2)  Metformin Hcl 1000 Mg  Tabs (Metformin Hcl) .Marland Kitchen.. 1bid 3)  Tricor 145 Mg  Tabs (Fenofibrate) .... Take One Tablet Daily 4)  Glimepiride 2 Mg  Tabs (Glimepiride) .... 1/2 Q Am 5)  Allergy Shots 6)  Atenolol 50 Mg  Tabs (Atenolol) .Marland Kitchen.. 1 By Mouth Qd 7)  Quinapril Hcl 40 Mg  Tabs (Quinapril Hcl) .Marland Kitchen.. 1 By Mouth Qd 8)  Coumadin 5 Mg  Tabs (Warfarin Sodium) .... Take As Directed 9)  Onetouch Ultra Test  Strp (Glucose Blood) .... Test Two Times A Day 10)  Amiodarone Hcl 400 Mg Tabs (Amiodarone Hcl) .... Take One Tablet By Mouth Twice A Day For 2 Weeks, Then Take One  Tablet By Mouth Daily  Allergies (verified): 1)  ! * No Ivp Dye Allergy 2)  ! * No Latex Allergy 3)  ! 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 Father: DM,CVA Mother: lung CA Siblings: bro testicular CA no FH premature CAD  Social History: Pt lives in [place].  He works at [employer].  He previously traveled frequently with his job, but less recently.  Tob- quit 1976.  ETOH- none,  Drugs- none  Review of Systems       All systems are reviewed and negative except as listed in the HPI.   Vital Signs:  Patient profile:   65 year old male Height:      69 inches Weight:      216 pounds BMI:     32.01 Pulse rate:   76 / minute Pulse rhythm:   irregular BP sitting:   100 / 80  (left arm) Cuff size:   regular  Vitals Entered By: Flonnie Overman (July 31, 2008 3:53 PM)  Physical Exam  General:  Well developed, well nourished, in no acute distress. Head:  normocephalic and atraumatic Eyes:  PERRLA/EOM intact; conjunctiva and lids normal. Nose:  no deformity, discharge, inflammation, or lesions Mouth:  Teeth, gums and palate  normal. Oral mucosa normal. Neck:  Neck supple, no JVD. No masses, thyromegaly or abnormal cervical nodes. Lungs:  Clear bilaterally to auscultation and percussion. Heart:  irregularly irregular, no m/r/g Abdomen:  Bowel sounds positive; abdomen soft and non-tender without masses, organomegaly, or hernias noted. No hepatosplenomegaly. Msk:  Back normal, normal gait. Muscle strength and tone normal. Pulses:  pulses normal in all 4 extremities Extremities:  No clubbing or cyanosis. Neurologic:  Alert and oriented x 3.  CNII-XII intact, strength/ sensation are  intact Skin:  Intact without lesions or rashes. Cervical Nodes:  no significant adenopathy Psych:  Normal affect.   Impression & Recommendations:  Problem # 1:  ATRIAL FIBRILLATION, CHRONIC (ICD-427.31) [name] presents today for consultation  regarding therapies for his afib.  He has failed medical therapy with multiple antiarrhythmic medicines including flecainide, multaq, tikosyn, and now amiodarone.  He is clearly symptomatic with atrial fibrillation.  Therapeutic strategies for afib including medicine and ablation were discussed in detail with the patient today. Risk, benefits, and alternatives to EP study and radiofrequency ablation for afib were also discussed in detail today. These risks include but are not limited to stroke, bleeding, vascular damage, tamponade, perforation, damage to the esophagus, lungs, and other structures, pulmonary vein stenosis, worsening renal function, and death. He understands that the anticipated success rate from catheter ablation for his persistent afib is reduced (likely 50-60%) and will almost certainly require multiple procedures.  We will obtain an echocardiogram to assess left atrial size.  If his left atrium is not severely enlarged, then we will plan to proceed with ablation.  His updated medication list for this problem includes:    Atenolol 50 Mg Tabs (Atenolol) .Marland Kitchen... 1 by mouth qd    Coumadin 5 Mg Tabs (Warfarin sodium) .Marland Kitchen... Take as directed    Amiodarone Hcl 400 Mg Tabs (Amiodarone hcl) .Marland Kitchen... Take one tablet by mouth twice a day for 2 weeks, then take one tablet by mouth daily  Problem # 2:  HYPERTENSION (ICD-401.9) stable,  no changes were made today His updated medication list for this problem includes:    Atenolol 50 Mg Tabs (Atenolol) .Marland Kitchen... 1 by mouth qd    Quinapril Hcl 40 Mg Tabs (Quinapril hcl) .Marland Kitchen... 1 by mouth qd  Other Orders: Ablation (Ablation) Echocardiogram (Echo)  Patient Instructions: 1)  Your physician has requested  that you have an echocardiogram.  Echocardiography is a painless test that uses sound waves to create images of your heart. It provides your doctor with information about the size and shape of your heart and how well your heart's chambers and valves are working.  This procedure takes approximately one hour. There are no restrictions for this procedure. 2)  Your physician has recommended that you have an ablation.  Catheter ablation is a medical procedure used to treat some cardiac arrhythmias (irregular heartbeats). During catheter ablation, a long, thin, flexible tube is put into a blood vessel in your groin (upper thigh), or neck. This tube is called an ablation catheter. It is then guided to your heart through the blood vessel. Radiofrequency waves destroy small areas of heart tissue where abnormal heartbeats may cause an arrhythmia to start.  Please see the instruction sheet given to you today. 3)  We are looking at 08/25/08 for the ablation depending on the results of the echo. 4)  We will call you with the results

## 2010-02-17 NOTE — Letter (Signed)
Summary: Cardioversion/TEE Instructions  [facility], Main Office  [address]   [place], [place] [address]   Phone: [phone]  Fax: [phone]    Cardioversion / TEE Cardioversion Instructions  02/03/2010 MRN: [id number]  [facility] [address] [place], [place]  [address]  Dear Mr. [facility], You are scheduled for a Cardioversion on 02/09/10 with Dr. Johney Frame.   Please arrive at the [facility] at 7:00am on the day of your procedure.  1)   DIET:  A)   Nothing to eat or drink after midnight except your medications with a sip of water.   2)   Come to the [place] office on ____________________ for lab work.        You do not have to be fasting.  3)   MAKE SURE YOU TAKE YOUR COUMADIN.  4)   A)   DO NOT TAKE these medications before your procedure: Glimepiride      B)   YOU MAY TAKE ALL of your remaining medications with a small amount of water.  5)  Must have a responsible person to drive you home.  6)   Bring a current list of your medications and current insurance cards.   * Special Note:  Every effort is made to have your procedure done on time. Occasionally there are emergencies that present themselves at the hospital that may cause delays. Please be patient if a delay does occur.  * If you have any questions after you get home, please call the office at [phone]. Anselm Pancoast

## 2010-02-17 NOTE — Progress Notes (Signed)
Summary: NEED PRIOR AUTHORIZATION  wellbutrin   Phone Note From Pharmacy Call back at [phone]   Caller: [facility] [address] [id number]* Summary of Call: NEED PRIOR AUTHORIZATION WELBUTRIN  [phone] PRIOR AUTH NO. Initial call taken by: Burnard Leigh,  June 18, 2008 10:59 AM  Follow-up for Phone Call        wellburtrin is not cardiac med prior authorization should come from primary care doctor Follow-up by: Burnett Kanaris, CNA,  June 19, 2008 2:15 PM

## 2010-02-17 NOTE — Assessment & Plan Note (Signed)
Summary: pt/cbs  Nurse Visit   Vital Signs:  Patient profile:   65 year old male Weight:      218.6 pounds Pulse rate:   72 / minute BP sitting:   120 / 80  (left arm) Cuff size:   large  Vitals Entered By: Shonna Chock (July 06, 2008 4:16 PM)   Impression & Recommendations:  Problem # 1:  PAROXYSMAL ATRIAL FIBRILLATION (ICD-427.31)  His updated medication list for this problem includes:    Atenolol 50 Mg Tabs (Atenolol) .Marland Kitchen... 1 by mouth qd    Coumadin 5 Mg Tabs (Warfarin sodium) .Marland Kitchen... Take as directed    Amiodarone Hcl 400 Mg Tabs (Amiodarone hcl) .Marland Kitchen... Take one tablet by mouth twice a day for 2 weeks, then take one tablet by mouth daily  Orders: Est. Patient Level I ([address]) Protime (60454UJ)  Problem # 2:  ENCOUNTER FOR THERAPEUTIC DRUG MONITORING (ICD-V58.83)  Orders: Est. Patient Level I (81191) Protime (47829FA)  Complete Medication List: 1)  Actos 45 Mg Tabs (Pioglitazone hcl) .Marland Kitchen.. 1 by mouth qd 2)  Metformin Hcl 1000 Mg Tabs (Metformin hcl) .Marland Kitchen.. 1bid 3)  Tricor 145 Mg Tabs (Fenofibrate) .... Take one tablet daily 4)  Glimepiride 2 Mg Tabs (Glimepiride) .... 1/2 q am 5)  Allergy Shots  6)  Atenolol 50 Mg Tabs (Atenolol) .Marland Kitchen.. 1 by mouth qd 7)  Quinapril Hcl 40 Mg Tabs (Quinapril hcl) .Marland Kitchen.. 1 by mouth qd 8)  Coumadin 5 Mg Tabs (Warfarin sodium) .... Take as directed 9)  Onetouch Ultra Test Strp (Glucose blood) .... Test two times a day 10)  Amiodarone Hcl 400 Mg Tabs (Amiodarone hcl) .... Take one tablet by mouth twice a day for 2 weeks, then take one tablet by mouth daily   Patient Instructions: 1)  see instructions  CC: PT CHECK    Allergies (verified): No Known Drug Allergies  Laboratory Results   Blood Tests      INR: 4.9   (Normal Range: 0.88-1.12   Therap INR: 2.0-3.5)      Orders Added: 1)  Est. Patient Level I [21308] 2)  Protime [65784ON]      ANTICOAGULATION RECORD PREVIOUS REGIMEN & LAB RESULTS   Previous INR:  4.9 ratio  on  06/09/2008 Previous Coumadin Dose(mg):  5MG  M,W,F . 2.5MG  ALL OTHER DAYS on  05/01/2008 Previous Regimen:  M,F 5MG -2.5MG  ALL OTHER DAYS on  02/27/2008  NEW REGIMEN & LAB RESULTS Current INR: 4.9 Current Coumadin Dose(mg): 5MG -M/F, 2.5MG  ALL OTHER DAYS Regimen: NONE TODAY, THEN 2.5MG  DAILY  Provider: Zaiah Eckerson      Repeat testing in: 1 WEEK MEDICATIONS ACTOS 45 MG  TABS (PIOGLITAZONE HCL) 1 by mouth qd METFORMIN HCL 1000 MG  TABS (METFORMIN HCL) 1bid TRICOR 145 MG  TABS (FENOFIBRATE) take one tablet daily GLIMEPIRIDE 2 MG  TABS (GLIMEPIRIDE) 1/2 q am * ALLERGY SHOTS  ATENOLOL 50 MG  TABS (ATENOLOL) 1 by mouth qd QUINAPRIL HCL 40 MG  TABS (QUINAPRIL HCL) 1 by mouth qd COUMADIN 5 MG  TABS (WARFARIN SODIUM) take as directed ONETOUCH ULTRA TEST  STRP (GLUCOSE BLOOD) test two times a day AMIODARONE HCL 400 MG TABS (AMIODARONE HCL) Take one tablet by mouth twice a day for 2 weeks, then take one tablet by mouth daily   Anticoagulation Visit Questionnaire      Coumadin dose missed/changed:  No      Abnormal Bleeding Symptoms:  No   Any diet changes including alcohol intake, vegetables or greens since the  last visit:  No Any illnesses or hospitalizations since the last visit:  No Any signs of clotting since the last visit (including chest discomfort, dizziness, shortness of breath, arm tingling, slurred speech, swelling or redness in leg):  No

## 2010-02-17 NOTE — Assessment & Plan Note (Signed)
Summary: CONGESTION/ PT CHECK/PH   Vital Signs:  Patient profile:   65 year old male Weight:      226.6 pounds BMI:     34.58 Temp:     97.7 degrees F oral Pulse rate:   72 / minute Resp:     16 per minute BP sitting:   110 / 78  (left arm) Cuff size:   large  Vitals Entered By: Shonna Chock CMA (December 16, 2009 1:42 PM) CC: 1.) Congestion x 8-9 day  2.) PT check, URI symptoms   Primary Care Provider:  [name] MD  CC:  1.) Congestion x 8-9 day  2.) PT check and URI symptoms.  History of Present Illness: RTI  Symptoms      This is a 65 year old man who presents with RTI  symptoms X 10 days; onset as sneezing.  The patient reports nasal congestion and productive cough ( semi opaque, not purulent), but denies purulent nasal discharge, sore throat, and earache ( increased "pressure in head").  Associated symptoms include dyspnea and wheezing.  The patient denies fever.  The patient denies frontal  headache & bilateral facial pain, tooth pain, and tender adenopathy.  No extrinsic symptoms now. Rx: Mucinex. Remote smoker.PMH of allergy induced asthma. Leaving on cruise 12/2 in evening . He has been having interventions ( cardioversion & ablation) in past 2 months  for his AF .  FBS average 110; no hypoglycemia.  Current Medications (verified): 1)  Glimepiride 2 Mg  Tabs (Glimepiride) .... 1/2 Q Am 2)  Allergy Shots .... Twice Per Month 3)  Quinapril Hcl 40 Mg  Tabs (Quinapril Hcl) .Marland Kitchen.. 1 By Mouth Qd 4)  Coumadin 5 Mg Tabs (Warfarin Sodium) .... Daw Uad 5)  Onetouch Ultra Test  Strp (Glucose Blood) .... Check Bloodsugar 1 X Daily 6)  Multivitamins  Caps (Multiple Vitamin) .... Take 2 Daily 7)  Hydrochlorothiazide 25 Mg Tabs (Hydrochlorothiazide) .... Take One Tablet By Mouth Daily. 8)  Atenolol 50 Mg Tabs (Atenolol) .Marland Kitchen.. 1 By Mouth Once Daily 9)  Amiodarone Hcl 400 Mg Tabs (Amiodarone Hcl) .... Take One Tablet By Mouth Twice A Day 10)  Actos 45 Mg Tabs (Pioglitazone Hcl) .Marland Kitchen.. 1  By Mouth Once Daily  Allergies: 1)  ! * No Ivp Dye Allergy 2)  ! * No Latex Allergy 3)  ! * No Shellfish Allergy  Physical Exam  General:  well-nourished,in no acute distress; alert,appropriate and cooperative throughout examination Ears:  External ear exam shows no significant lesions or deformities.  Otoscopic examination reveals clear canals, tympanic membranes are intact bilaterally without bulging, retraction, inflammation or discharge. Hearing is grossly normal bilaterally. Nose:  External nasal examination shows no deformity or inflammation. Nasal mucosa are pink and moist without lesions or exudates. Mouth:  Oral mucosa and oropharynx without lesions or exudates.  Teeth in good repair. Minimal pharyngeal erythema.   Lungs:  Normal respiratory effort, chest expands symmetrically. Lungs : scattered rhonchi & wheezes. Heart:  Rate  in 70s and irregular rhythm.   Cervical Nodes:  No lymphadenopathy noted Axillary Nodes:  No palpable lymphadenopathy Psych:  subdued.   Frustrated by cardiac issues   Impression & Recommendations:  Problem # 1:  BRONCHITIS-ACUTE (ICD-466.0)  RAD component  Orders: T-2 View CXR (71020TC)  His updated medication list for this problem includes:    Xopenex Hfa 45 Mcg/act Aero (Levalbuterol tartrate) .Marland Kitchen... 1 inhalation every every 6 hrs as needed    Dulera 200-5 Mcg/act  Aero (Mometasone furo-formoterol fum) .Marland Kitchen... 1-2 puffs every 12 hrs as needed ; gargle & spit after use    Singulair 10 Mg Tabs (Montelukast sodium) .Marland Kitchen... 1 once daily    Amoxicillin 500 Mg Caps (Amoxicillin) .Marland Kitchen... 1 three times a day  Problem # 2:  URI (ICD-465.9)  Problem # 3:  ATRIAL FIBRILLATION (ICD-427.31) rate controlled The following medications were removed from the medication list:    Flecainide Acetate 100 Mg Tabs (Flecainide acetate) .Marland Kitchen... Take one &one half tablets by mouth every 12 hours His updated medication list for this problem includes:    Coumadin 5 Mg Tabs  (Warfarin sodium) .Marland Kitchen... Daw uad    Atenolol 50 Mg Tabs (Atenolol) .Marland Kitchen... 1 by mouth once daily    Amiodarone Hcl 400 Mg Tabs (Amiodarone hcl) .Marland Kitchen... Take one tablet by mouth twice a day  Orders: Protime (91478GN)  Problem # 4:  DIABETES MELLITUS, TYPE II (ICD-250.00) controlled by history His updated medication list for this problem includes:    Glimepiride 2 Mg Tabs (Glimepiride) .Marland Kitchen... 1/2 q am    Quinapril Hcl 40 Mg Tabs (Quinapril hcl) .Marland Kitchen... 1 by mouth qd    Actos 45 Mg Tabs (Pioglitazone hcl) .Marland Kitchen... 1 by mouth once daily  Complete Medication List: 1)  Glimepiride 2 Mg Tabs (Glimepiride) .... 1/2 q am 2)  Allergy Shots  .... Twice per month 3)  Quinapril Hcl 40 Mg Tabs (Quinapril hcl) .Marland Kitchen.. 1 by mouth qd 4)  Coumadin 5 Mg Tabs (Warfarin sodium) .... Daw uad 5)  Onetouch Ultra Test Strp (Glucose blood) .... Check bloodsugar 1 x daily 6)  Multivitamins Caps (Multiple vitamin) .... Take 2 daily 7)  Hydrochlorothiazide 25 Mg Tabs (Hydrochlorothiazide) .... Take one tablet by mouth daily. 8)  Atenolol 50 Mg Tabs (Atenolol) .Marland Kitchen.. 1 by mouth once daily 9)  Amiodarone Hcl 400 Mg Tabs (Amiodarone hcl) .... Take one tablet by mouth twice a day 10)  Actos 45 Mg Tabs (Pioglitazone hcl) .Marland Kitchen.. 1 by mouth once daily 11)  Xopenex Hfa 45 Mcg/act Aero (Levalbuterol tartrate) .Marland Kitchen.. 1 inhalation every every 6 hrs as needed 12)  Dulera 200-5 Mcg/act Aero (Mometasone furo-formoterol fum) .Marland Kitchen.. 1-2 puffs every 12 hrs as needed ; gargle & spit after use 13)  Singulair 10 Mg Tabs (Montelukast sodium) .Marland Kitchen.. 1 once daily 14)  Amoxicillin 500 Mg Caps (Amoxicillin) .Marland Kitchen.. 1 three times a day  Patient Instructions: 1)  Neti pot once daily as needed for head congestion. 2)  Drink as much  NON dairy fluid as you can tolerate for the next few days. No changein warfarin; recheck in 4 weeks. Prescriptions: AMOXICILLIN 500 MG CAPS (AMOXICILLIN) 1 three times a day  #30 x 0   Entered and Authorized by:   [name] MD    Signed by:   [name] MD on 12/16/2009   Method used:   Print then Give to Patient   RxID:   [id number] SINGULAIR 10 MG TABS (MONTELUKAST SODIUM) 1 once daily  #30 x 5   Entered and Authorized by:   [name] MD   Signed by:   [name] MD on 12/16/2009   Method used:   Print then Give to Patient   RxID:   [id number] DULERA 200-5 MCG/ACT AERO (MOMETASONE FURO-FORMOTEROL FUM) 1-2 puffs every 12 hrs as needed ; gargle & spit after use  #1 x 5   Entered and Authorized by:   [name] MD   Signed by:   [name]  MD on 12/16/2009   Method used:   Print then Give to Patient   RxID:   [id number] XOPENEX HFA 45 MCG/ACT AERO (LEVALBUTEROL TARTRATE) 1 inhalation every every 6 hrs as needed  #1 x 2   Entered and Authorized by:   [name] MD   Signed by:   [name] MD on 12/16/2009   Method used:   Print then Give to Patient   RxID:   [id number]    Orders Added: 1)  Protime [25956LO] 2)  Est. Patient Level IV [75643] 3)  T-2 View CXR [71020TC]     ANTICOAGULATION RECORD PREVIOUS REGIMEN & LAB RESULTS Anticoagulation Diagnosis:  Atrial fibrillation on  09/04/2008 Previous INR Goal Range:  2-3 on  09/15/2008 Previous INR:  2.7 ratio on  11/25/2009 Previous Coumadin Dose(mg):  5mg  M/W/F, 2.5mg  all other days on  08/18/2009 Previous Regimen:  5MG  TODAY, THEN 5MG  ON WED, 2.5MG  ALL OTHER DAYS on  11/13/2008 Previous Coagulation Comments:  Patient taking 2.5 mg daily, patient to continue current regimen and return to clinic in one month. on  07/13/2008  NEW REGIMEN & LAB RESULTS Current INR: 2.6 Current Coumadin Dose(mg): 2.5mg  daily Regimen: 5MG  TODAY, THEN 5MG  ON WED, 2.5MG  ALL OTHER DAYS  (no change)  Provider: Haidy Kackley MEDICATIONS GLIMEPIRIDE 2 MG  TABS (GLIMEPIRIDE) 1/2 q am * ALLERGY SHOTS twice per month QUINAPRIL HCL 40 MG  TABS (QUINAPRIL HCL) 1 by mouth qd COUMADIN 5 MG TABS (WARFARIN SODIUM) DAW UAD  [BMN] ONETOUCH ULTRA TEST  STRP (GLUCOSE BLOOD) Check bloodsugar 1 x daily MULTIVITAMINS  CAPS (MULTIPLE VITAMIN) Take 2 daily HYDROCHLOROTHIAZIDE 25 MG TABS (HYDROCHLOROTHIAZIDE) Take one tablet by mouth daily. ATENOLOL 50 MG TABS (ATENOLOL) 1 by mouth once daily AMIODARONE HCL 400 MG TABS (AMIODARONE HCL) Take one tablet by mouth twice a day ACTOS 45 MG TABS (PIOGLITAZONE HCL) 1 by mouth once daily XOPENEX HFA 45 MCG/ACT AERO (LEVALBUTEROL TARTRATE) 1 inhalation every every 6 hrs as needed DULERA 200-5 MCG/ACT AERO (MOMETASONE FURO-FORMOTEROL FUM) 1-2 puffs every 12 hrs as needed ; gargle & spit after use SINGULAIR 10 MG TABS (MONTELUKAST SODIUM) 1 once daily AMOXICILLIN 500 MG CAPS (AMOXICILLIN) 1 three times a day

## 2010-02-17 NOTE — Assessment & Plan Note (Signed)
Summary: ROA--ORIGINAL DOS- IS  04-25-2007-[name]   Vital Signs:  Patient Profile:   65 Years Old Male Weight:      216.8 pounds Resp:     15 per minute BP sitting:   130 / 72  Pt. in pain?   no                  Chief Complaint:  F/u of DM, HTN, & new issues of hand edema  & hesitancy & nocturia, and Type 2 diabetes mellitus follow-up.  History of Present Illness:  Type 2 Diabetes Mellitus Follow-Up      This is a 65 year old man who presents for Type 2 diabetes mellitus follow-up.  FBS 80s-100;pc <140.  The patient denies polyuria, polydipsia, blurred vision, self managed hypoglycemia, hypoglycemia requiring help, weight loss, weight gain, and numbness of extremities.  The patient denies the following symptoms: neuropathic pain, chest pain, vomiting, orthostatic symptoms, poor wound healing, intermittent claudication, vision loss, and foot ulcer.  Since the last visit the patient reports poor dietary compliance, compliance with medications, not exercising regularly, and monitoring blood glucose.  The patient has been measuring capillary blood glucose before breakfast and after dinner.  Since the last visit, the patient reports having had eye care by an ophthalmologist and no foot care.  Complications from diabetes include ASCVD.    Hypertension History:      He complains of dyspnea with exertion, peripheral edema, and neurologic problems, but denies headache, chest pain, palpitations, orthopnea, PND, visual symptoms, syncope, and side effects from treatment.  He notes no problems with any antihypertensive medication side effects.  Further comments include: BP 118/64-150/80.        Positive major cardiovascular risk factors include male age 64 years old or older, diabetes, hyperlipidemia, and hypertension.  Negative major cardiovascular risk factors include negative family history for ischemic heart disease and non-tobacco-user status.        Further assessment for target organ damage  reveals no history of ASHD, stroke/TIA, or peripheral vascular disease.    Lipid Management History:      Positive NCEP/ATP III risk factors include male age 64 years old or older, diabetes, and hypertension.  Negative NCEP/ATP III risk factors include no family history for ischemic heart disease, non-tobacco-user status, no ASHD (atherosclerotic heart disease), no prior stroke/TIA, no peripheral vascular disease, and no history of aortic aneurysm.        Current Allergies: No known allergies   Past Medical History:    renal calculus OP 2005 ;PAF ,S/P cardioversion 9/08  Past Surgical History:    PAF ; S/P ablation ?2001; cardioversion X3 ; TKR 7/08  Dr Charlann Boxer   Social History:    no diet    Review of Systems  General      Denies chills, fatigue, fever, sleep disorder, sweats, and weight loss.  Eyes      Denies blurring, double vision, eye pain, and vision loss-both eyes.  ENT      Denies decreased hearing, difficulty swallowing, earache, nasal congestion, ringing in ears, and sinus pressure.  CV      Denies bluish discoloration of lips or nails and leg cramps with exertion.      Edema in both hands upon awakening, resolves after 1+ hours  Resp      Denies cough, excessive snoring, hypersomnolence, morning headaches, and sputum productive.  GI      Denies abdominal pain, bloody stools, change in bowel habits, constipation, dark tarry stools,  diarrhea, indigestion, nausea, and vomiting.  GU      Complains of decreased libido, erectile dysfunction, nocturia, and urinary hesitancy.      Nocturia 1-2X/night  MS      Complains of joint pain.      Denies joint redness, joint swelling, low back pain, mid back pain, muscle aches, muscle weakness, and thoracic pain.      Pain R shoulder with high level exertion  Derm      Denies changes in nail beds, dryness, hair loss, lesion(s), and rash.  Neuro      Complains of numbness and tingling.      Denies difficulty with  concentration, disturbances in coordination, memory loss, and poor balance.      CTS RUE  Psych      Denies anxiety, depression, easily angered, easily tearful, and irritability.  Endo      Denies cold intolerance, excessive hunger, excessive thirst, excessive urination, heat intolerance, polyuria, and weight change.  Heme      Denies abnormal bruising and bleeding.      PT INR 2.6 last week  Allergy      Denies itching eyes, seasonal allergies, and sneezing.   Physical Exam  General:     Well-developed,well-nourished,in no acute distress; alert,appropriate and cooperative throughout examination Head:     Normocephalic and atraumatic without obvious abnormalities. No apparent alopecia or balding. Eyes:     No corneal or conjunctival inflammation noted.  Perrla.  Mouth:     Oral mucosa and oropharynx without lesions or exudates.  Teeth in good repair. No erythema Neck:     No deformities, masses, or tenderness noted. Lungs:     Normal respiratory effort, chest expands symmetrically. Lungs are clear to auscultation, no crackles or wheezes. Heart:     Normal rate and regular rhythm. S1 and S2 normal without gallop, murmur, click, rub. S4 with slurring Abdomen:     Bowel sounds positive,abdomen soft and non-tender without masses, organomegaly or hernias noted. Rectal:     No external abnormalities noted. Normal sphincter tone. No rectal masses or tenderness. FOB + Genitalia:     Testes bilaterally descended without nodularity, tenderness or masses. No scrotal masses or lesions. No penis lesions or urethral discharge. Prostate:     1+ enlarged w/o nodules.   Msk:     No deformity or scoliosis noted of thoracic or lumbar spine.   Pulses:     R and L carotid,radial,dorsalis pedis and posterior tibial pulses are full and equal bilaterally Extremities:     No clubbing, cyanosis, edema, or deformity noted with normal full range of motion of all joints.  Neg Tinel's  Neurologic:      alert & oriented X3, strength normal in all extremities, gait normal, and DTRs symmetrical and0-1/2+.   Skin:     Intact without suspicious lesions or rashes Cervical Nodes:     No lymphadenopathy noted Axillary Nodes:     No palpable lymphadenopathy Psych:     memory intact for recent and remote, flat affect, and subdued.      Impression & Recommendations:  Problem # 1:  DM (ICD-250.00)  His updated medication list for this problem includes:    Actos 45 Mg Tabs (Pioglitazone hcl) .Marland Kitchen... 1 by mouth qd    Metformin Hcl 1000 Mg Tabs (Metformin hcl) .Marland KitchenMarland KitchenMarland KitchenMarland Kitchen 1bid    Glimepiride 2 Mg Tabs (Glimepiride) .Marland Kitchen... 1/2 q am    Quinapril Hcl 40 Mg Tabs (Quinapril hcl) .Marland Kitchen... 1 by  mouth qd   Problem # 2:  HYPERLIPIDEMIA NEC/NOS (ICD-272.4)  His updated medication list for this problem includes:    Tricor 145 Mg Tabs (Fenofibrate)   Problem # 3:  HYPERTENSION, ESSENTIAL NOS (ICD-401.9)  His updated medication list for this problem includes:    Hydrochlorothiazide 25 Mg Tabs (Hydrochlorothiazide) .Marland Kitchen... 1 by mouth qd    Atenolol 50 Mg Tabs (Atenolol) .Marland Kitchen... 1 by mouth qd    Quinapril Hcl 40 Mg Tabs (Quinapril hcl) .Marland Kitchen... 1 by mouth qd   Problem # 4:  PAROXYSMAL ATRIAL FIBRILLATION (ICD-427.31) as  per Dr [name] His updated medication list for this problem includes:    Atenolol 50 Mg Tabs (Atenolol) .Marland Kitchen... 1 by mouth qd    Flecainide Acetate 100 Mg Tabs (Flecainide acetate) .Marland Kitchen... 1 and 1/2 tab qam and 1 tab qpm    Coumadin 5 Mg Tabs (Warfarin sodium) .Marland Kitchen... Take as directed   Problem # 5:  HYPERPLASIA PROSTATE UNS W/UR OBST & OTH LUTS (ICD-600.91)  Problem # 6:  FECAL OCCULT BLOOD (ICD-792.1)  Orders: Gastroenterology Referral (GI)   Complete Medication List: 1)  Actos 45 Mg Tabs (Pioglitazone hcl) .Marland Kitchen.. 1 by mouth qd 2)  Metformin Hcl 1000 Mg Tabs (Metformin hcl) .Marland Kitchen.. 1bid 3)  Tricor 145 Mg Tabs (Fenofibrate) 4)  Glimepiride 2 Mg Tabs (Glimepiride) .... 1/2 q am 5)  Allergy Shots    6)  Hydrochlorothiazide 25 Mg Tabs (Hydrochlorothiazide) .Marland Kitchen.. 1 by mouth qd 7)  Atenolol 50 Mg Tabs (Atenolol) .Marland Kitchen.. 1 by mouth qd 8)  Flecainide Acetate 100 Mg Tabs (Flecainide acetate) .Marland Kitchen.. 1 and 1/2 tab qam and 1 tab qpm 9)  Quinapril Hcl 40 Mg Tabs (Quinapril hcl) .Marland Kitchen.. 1 by mouth qd 10)  Lomotil 2.5-0.025 Mg Tabs (Diphenoxylate-atropine) .... Take one tab as needed diarrhea 11)  Coumadin 5 Mg Tabs (Warfarin sodium) .... Take as directed  Hypertension Assessment/Plan:      The patient's hypertensive risk group is category C: Target organ damage and/or diabetes.  His calculated 10 year risk of coronary heart disease is 11 %.  Today's blood pressure is 130/72.    Lipid Assessment/Plan:      Based on NCEP/ATP III, the patient's risk factor category is "history of diabetes".  From this information, the patient's calculated lipid goals are as follows: Total cholesterol goal is 200; LDL cholesterol goal is 100; HDL cholesterol goal is 40; Triglyceride goal is 150.  His LDL cholesterol goal has been met.     Patient Instructions: 1)  Follow The Flat Belly Diet; increase CVE as discussed. Additional recommendations pending review of labs. , including PSA.    ]  Appended Document: ROA This should be dated 04/25/07; Regina signed off on UA , closing chart so I could not enter data from paaper record. Please change date as he was not seen 05/01/07.  Appended Document: ROA--ORIGINAL DOS- IS  04-25-2007-[name] Correct date of visit was 04/25/07 @ 9:07 am. Office note was signed off by Lab before data entered from paper record( necessitated by EMR malfunction). Data entered on 4/15 as noted when chart entry made accessible to me.

## 2010-02-17 NOTE — Progress Notes (Signed)
Summary: need new medication called in  Phone Note Call from Patient Call back at Home Phone [phone]   Caller: Spouse Reason for Call: Talk to Nurse Summary of Call: per spouse pt was suppose to start a new medication in the am per a conversation with Dr. [name] last night pls call this into [facility] on [facility] in [place]  [phone] Initial call taken by: Omer Jack,  June 22, 2008 2:13 PM  Follow-up for Phone Call        Per Dr. [name]- start Multaq 400mg  two times a day. Rx sent to [address]. Pt wife aware. Sherri Rad, RN, BSN  June 22, 2008 4:42 PM     New/Updated Medications: MULTAQ 400 MG TABS (DRONEDARONE HCL) take 1 tab by mouth two times a day   Prescriptions: MULTAQ 400 MG TABS (DRONEDARONE HCL) take 1 tab by mouth two times a day  #60 x 6   Entered by:   Sherri Rad, RN, BSN   Authorized by:   Nathen May, MD, [facility]   Signed by:   Sherri Rad, RN, BSN on 06/22/2008   Method used:   Electronically to        [facility] [address] [id number]* (retail)       [address]       [place], [place]  [address]       Ph: [phone]       Fax: [phone]   RxID:   [id number]

## 2010-02-17 NOTE — Assessment & Plan Note (Addendum)
Summary: rov   Visit Type:  Follow-up Referring Provider:  Berton Mount, MD Primary Provider:  [name] MD   History of Present Illness: The patient presents today for routine electrophysiology followup. He has had ERAF post redo afib ablation.  He reports symptoms of fatigue and decreased exercise tolerance with afib.  He was recently unsuccessfully cardioverted.  The patient denies symptoms of chest pain, orthopnea, PND, dizziness, presyncope, syncope, or neurologic sequela.  The patient is tolerating medications without difficulties and is otherwise without complaint today.   Current Medications (verified): 1)  Glimepiride 2 Mg  Tabs (Glimepiride) .... 1/2 Q Am **labs Due 04/2010** 2)  Allergy Shots .... Twice Per Month 3)  Quinapril Hcl 40 Mg  Tabs (Quinapril Hcl) .Marland Kitchen.. 1 By Mouth Qd 4)  Coumadin 5 Mg Tabs (Warfarin Sodium) .... Daw Uad 5)  Onetouch Ultra Test  Strp (Glucose Blood) .... Check Bloodsugar 1 X Daily 6)  Multivitamins  Caps (Multiple Vitamin) .... Take 2 Daily 7)  Hydrochlorothiazide 25 Mg Tabs (Hydrochlorothiazide) .... Take 1/2 Tablet By Mouth Daily. 8)  Atenolol 50 Mg Tabs (Atenolol) .Marland Kitchen.. 1 By Mouth Once Daily 9)  Amiodarone Hcl 400 Mg Tabs (Amiodarone Hcl) .... Take One Tablet By Mouth Twice A Day 10)  Xopenex Hfa 45 Mcg/act Aero (Levalbuterol Tartrate) .Marland Kitchen.. 1 Inhalation Every Every 6 Hrs As Needed 11)  Dulera 200-5 Mcg/act Aero (Mometasone Furo-Formoterol Fum) .Marland Kitchen.. 1-2 Puffs Every 12 Hrs As Needed ; Gargle & Spit After Use 12)  Tradjenta 5 Mg Tabs (Linagliptin) .... Take One Daily  Allergies (verified): 1)  ! * No Ivp Dye Allergy 2)  ! * No Latex Allergy 3)  ! * No Shellfish Allergy  Past History:  Past Medical History: Reviewed history from 11/25/2009 and no changes required. renal calculus OP 2005 Diabetes mellitus, type II Hyperlipidemia Hypertension Persistent atrial fibrillation s/p ablation 09/22/08 and 11/03/09 Cardioversion-2011  Past Surgical  History: Reviewed history from 11/25/2009 and no changes required. THR 7/08  Dr Charlann Boxer; cath 2005 non obstructive CAD Afib ablation 09/22/08 and 11/03/09  Family History: Reviewed history from 09/06/2007 and no changes required. Father: DM,CVA Mother: lung CA Siblings: bro testicular CA no FH premature CAD  Social History: Reviewed history from 07/31/2008 and no changes required. Pt lives in [place].  He works at [employer].  He previously traveled frequently with his job, but less recently.  Tob- quit 1976.  ETOH- none,  Drugs- none  Review of Systems       All systems are reviewed and negative except as listed in the HPI.   Vital Signs:  Patient profile:   65 year old male Height:      68 inches Weight:      233 pounds Pulse rate:   96 / minute Pulse rhythm:   irregular BP sitting:   130 / 100  (right arm)  Vitals Entered By: Jacquelin Hawking, CMA (February 02, 2010 4:49 PM)  Physical Exam  General:  well-nourished,in no acute distress; alert,appropriate and cooperative throughout examination Head:  normocephalic and atraumatic Eyes:  PERRLA/EOM intact; conjunctiva and lids normal. Mouth:  Oral mucosa and oropharynx without lesions or exudates.  Teeth in good repair. Minimal pharyngeal erythema.   Neck:  supple Lungs:  Clear bilaterally to auscultation and percussion. Heart:  iRRR, no m/r/g Abdomen:  Bowel sounds positive; abdomen soft and non-tender without masses, organomegaly, or hernias noted. No hepatosplenomegaly. Msk:  Back normal, normal gait. Muscle strength and tone normal. Extremities:  No clubbing or cyanosis. Neurologic:  Alert and oriented x 3. Skin:  Intact without lesions or rashes. Psych:  Normal affect.   EKG  Procedure date:  02/02/2010  Findings:      afib, V rate 90, RBBB, nonspecific ST/T changes  Impression & Recommendations:  Problem # 1:  ATRIAL FIBRILLATION (ICD-427.31) rate controlled The patient has been loaded with  amiodarone.  He is now 3 months post ablation and therefore should be healed from his most recent ablation.  I will therefore perform cardioversion at this time.  I will decrease amiodarone to 200mg  two times a day today and then decrease to 200mg  daily if he maintains sinus rhythm.  He has very refractory atrial fibrillation.  If he fails cardioversion at this point, I think that our options are rate control or possible referral for a more invasive epicardial ablation procedure.  We discussed risks of amiodarone today.  The patient understands my desire to minimize the use of amiodarone longterm but agrees to continue amiodarone for now.  His INR is 3.1 today.  He has been therapeutic chronically.  Problem # 2:  HYPERTENSION (ICD-401.9) stable  Other Orders: EKG w/ Interpretation (93000)  Patient Instructions: 1)  Your physician has recommended that you have a cardioversion (DCCV).  Electrical cardioversion uses a jolt of electricity to your heart either through paddles or wired patches attached to your chest. This is a controlled, usually prescheduled, procedure. Defibrillation is done under light anesthesia in the hospital, and you usually go home the day of the procedure. This is done to get your heart back into a normal rhythm. You are not awake for the procedure. Please see the instruction sheet given to you today. 2)  Will schedule for 02/09/10 with Dr Johney Frame 3)  Your physician has recommended you make the following change in your medication: decrease amiodarone to 200mg  two times a day

## 2010-02-17 NOTE — Progress Notes (Signed)
Summary: PT/INR Results  Phone Note Outgoing Call Call back at Work Phone [phone]   Call placed by: Shonna Chock,  February 10, 2009 3:47 PM Call placed to: Patient Details for Reason: PT/INR Results Summary of Call: Per Dr.Hopper no change and recheck level in 1 month. Level was 2.0 via Lab and 1.9 via the PT electronic machine(yesterday). Patient to call if any questions or concerns  .Shonna Chock  February 10, 2009 3:49 PM

## 2010-02-17 NOTE — Progress Notes (Signed)
Summary: HOPPER-REFILL COUMADIN 5MG   Phone Note Refill Request   Refills Requested: Medication #1:  COUMADIN 5 MG  SOLR patient stopped. RECD BY PT [facility] [phone] LAST FILLED #90                CALL BACK NUMBER TO PT [phone],    [phone]   Initial call taken by: Vanessa Swaziland,  November 15, 2006 8:58 AM  Follow-up for Phone Call        Rx called to pharmacy Follow-up by: Wendall Stade,  November 15, 2006 10:35 AM    New/Updated Medications: COUMADIN 5 MG  SOLR (WARFARIN SODIUM) take as directed, changes monthly   Prescriptions: COUMADIN 5 MG  SOLR (WARFARIN SODIUM) take as directed, changes monthly  #90 x 1   Entered by:   Wendall Stade   Authorized by:   [name] MD   Signed by:   Wendall Stade on 11/15/2006   Method used:   Printed then faxed to ...         RxID:   [id number]  faxed to [facility] mail order

## 2010-02-17 NOTE — Progress Notes (Signed)
Summary: Refill BP med  Phone Note Refill Request Message from:  Patient on February 11, 2010 3:23 PM  Refills Requested: Medication #1:  TRADJENTA 5 MG TABS take one daily. Spouse called for the above refill and is aware will send via emr.  Initial call taken by: Lucious Groves CMA,  February 11, 2010 3:23 PM    Prescriptions: TRADJENTA 5 MG TABS (LINAGLIPTIN) take one daily  #30 x 3   Entered by:   Lucious Groves CMA   Authorized by:   [name] MD   Signed by:   Lucious Groves CMA on 02/11/2010   Method used:   Electronically to        [id number]* (retail)       [address]       [place], [place]  [address]       Ph: [phone]       Fax: [phone]   RxID:   [id number]

## 2010-02-17 NOTE — Progress Notes (Signed)
Summary: lab faxed/ appt with dr. hopper on 8/30  @ 8:30  Phone Note Call from Patient Call back at Home Phone [phone]   Caller: Spouse- NANCY Reason for Call: Talk to Nurse Details for Reason: per wife called, when you  faxed labs records to dr. hopper office / pls put pt need an appt on 8/30  @ 8a.m.Marland KitchenMarland Kitchen Initial call taken by: Lorne Skeens,  September 08, 2008 3:58 PM

## 2010-02-17 NOTE — Progress Notes (Signed)
Summary: R/S ABLATION/ CALL WIFE BEFORE   Phone Note Call from Patient Call back at Home Phone [phone] Call back at [phone]   Caller: Spouse- NANCY Reason for Call: Talk to Nurse Summary of Call: R/S ABLATION. CALL WIFE BEFORE DOING THIS?  Initial call taken by: Lorne Skeens,  August 25, 2008 8:48 AM  Follow-up for Phone Call        Resch for 09/22/08 if ok with pt's sch Dennis Bast, RN, BSN  August 25, 2008 2:15 PM  Additional Follow-up for Phone Call Additional follow up Details #1::        See next phone note Additional Follow-up by: Gypsy Balsam RN BSN,  September 02, 2008 11:50 AM    New/Updated Medications: COUMADIN 1 MG TABS (WARFARIN SODIUM) Take 2 tablets by mouth once daily as directed [BMN] Prescriptions: COUMADIN 1 MG TABS (WARFARIN SODIUM) Take 2 tablets by mouth once daily as directed Brand medically necessary #60 x 30   Entered by:   Dennis Bast, RN, BSN   Authorized by:   Hillis Range, MD   Signed by:   Dennis Bast, RN, BSN on 08/25/2008   Method used:   Electronically to        [facility] [address] [id number]* (retail)       [address]       [place], [place]  [address]       Ph: [phone]       Fax: [phone]   RxID:   [id number]

## 2010-02-17 NOTE — Progress Notes (Signed)
Summary: pls call pt  Phone Note Call from Patient   Caller: Patient Reason for Call: Talk to Nurse Summary of Call: [phone] cell wants to know hospital date  Initial call taken by: Ammie del Tomasa Rand,  May 29, 2008 9:25 AM  Follow-up for Phone Call        Tikosyn admission Friday 5-28. Question when to stop Rhythmol, will D/W Dr. [name] and call patient back Follow-up by: Gypsy Balsam RN BSN,  May 29, 2008 9:47 AM  Additional Follow-up for Phone Call Additional follow up Details #1::        48 hours prior to admission or whenever he runs out which ever comes first Additional Follow-up by: Nathen May, MD, [facility],  Jun 01, 2008 1:23 PM    Additional Follow-up for Phone Call Additional follow up Details #2::    Patient aware of above. Follow-up by: Gypsy Balsam RN BSN,  Jun 02, 2008 11:13 AM

## 2010-02-17 NOTE — Assessment & Plan Note (Signed)
Summary: rto discuss lab/cbs   Vital Signs:  Patient profile:   65 year old male Weight:      221.8 pounds Temp:     97.8 degrees F oral Pulse rate:   66 / minute Resp:     16 per minute BP sitting:   124 / 68  (left arm) Cuff size:   large  Vitals Entered By: Shonna Chock (May 01, 2008 3:59 PM) Comments DISCUSS LABS, DISCUSS WRIST SPLINT . DIZZINESS   History of Present Illness: FBS 80-92; no hypoglycemia. Two hrs post meal < 130.He describes weakness with "brisk walks". Generalized weakness several months. He has decreased libido & ED . Dr Odessa Fleming & Allred's notes reviewed. Cardioversions 3/30 by Dr Johney Frame & Dr Tenny Craw 4/09.  Allergies (verified): No Known Drug Allergies  Review of Systems General:  Complains of fatigue and sleep disorder; denies weight loss; Freq awakening. ENT:  Denies difficulty swallowing and hoarseness. CV:  See HPI; Denies leg cramps with exertion; Weakness in legs. Resp:  Denies excessive snoring, hypersomnolence, and morning headaches. GI:  Denies constipation and diarrhea. GU:  Complains of decreased libido and erectile dysfunction. MS:  L shoulder pain. Derm:  Denies changes in nail beds, dryness, hair loss, and poor wound healing. Neuro:  Complains of numbness; denies tingling; Hand numbness. Psych:  Denies anxiety and depression. Endo:  Denies cold intolerance, excessive hunger, excessive thirst, excessive urination, and heat intolerance.  Physical Exam  General:  well-nourished,in no acute distress; alert,appropriate and cooperative throughout examination Neck:  No deformities, masses, or tenderness noted. Lungs:  Normal respiratory effort, chest expands symmetrically. Lungs are clear to auscultation, no crackles or wheezes. Heart:  Normal rate and regular rhythm. S1 and S2 normal without gallop, murmur, click, rub. S4 Abdomen:  Bowel sounds positive,abdomen soft and non-tender without masses, organomegaly or hernias noted. Pulses:  R and L  carotid,radial,dorsalis pedis and posterior tibial pulses are full and equal bilaterally Extremities:  No clubbing, cyanosis, edema . Some crepitus L shoulder Neurologic:  alert & oriented X3, strength normal in all extremities, and DTRs symmetrical but 1/2+ @ knees Skin:  Intact without suspicious lesions or rashes Cervical Nodes:  No lymphadenopathy noted Axillary Nodes:  No palpable lymphadenopathy Psych:  memory intact for recent and remote, normally interactive, and flat affect.     Impression & Recommendations:  Problem # 1:  DIABETES MELLITUS, TYPE II (ICD-250.00)  His updated medication list for this problem includes:    Actos 45 Mg Tabs (Pioglitazone hcl) .Marland Kitchen... 1 by mouth qd    Metformin Hcl 1000 Mg Tabs (Metformin hcl) .Marland KitchenMarland KitchenMarland KitchenMarland Kitchen 1bid    Glimepiride 2 Mg Tabs (Glimepiride) .Marland Kitchen... 1/2 q am    Quinapril Hcl 40 Mg Tabs (Quinapril hcl) .Marland Kitchen... 1 by mouth qd  Problem # 2:  WEAKNESS (ICD-780.79)  Problem # 3:  DECREASED LIBIDO (ICD-799.81)  Problem # 4:  ERECTILE DYSFUNCTION (ICD-302.72)  Problem # 5:  ENCOUNTER FOR THERAPEUTIC DRUG MONITORING (ICD-V58.83)  Orders: Protime ([id number])  Problem # 6:  PAROXYSMAL ATRIAL FIBRILLATION (ICD-427.31)  His updated medication list for this problem includes:    Atenolol 50 Mg Tabs (Atenolol) .Marland Kitchen... 1 by mouth qd    Flecainide Acetate 100 Mg Tabs (Flecainide acetate) .Marland Kitchen... 1 qam and 1 tab qpm    Coumadin 5 Mg Tabs (Warfarin sodium) .Marland Kitchen... Take as directed  Orders: Protime ([id number])  Complete Medication List: 1)  Actos 45 Mg Tabs (Pioglitazone hcl) .Marland Kitchen.. 1 by mouth qd 2)  Metformin Hcl 1000 Mg  Tabs (Metformin hcl) .Marland Kitchen.. 1bid 3)  Tricor 145 Mg Tabs (Fenofibrate) .... Take one tablet daily 4)  Glimepiride 2 Mg Tabs (Glimepiride) .... 1/2 q am 5)  Allergy Shots  6)  Hydrochlorothiazide 25 Mg Tabs (Hydrochlorothiazide) .Marland Kitchen.. 1 by mouth qd 7)  Atenolol 50 Mg Tabs (Atenolol) .Marland Kitchen.. 1 by mouth qd 8)  Flecainide Acetate 100 Mg Tabs (Flecainide acetate)  .Marland Kitchen.. 1 qam and 1 tab qpm 9)  Quinapril Hcl 40 Mg Tabs (Quinapril hcl) .Marland Kitchen.. 1 by mouth qd 10)  Coumadin 5 Mg Tabs (Warfarin sodium) .... Take as directed 11)  Onetouch Ultra Test Strp (Glucose blood) .... Test two times a day  Patient Instructions: 1)  Fasting CBC&dif, testosterone level, free T4, TSH (codes: 780.79,799.81,302.72). No change in coumadin; recheck 1 month   ANTICOAGULATION RECORD PREVIOUS REGIMEN & LAB RESULTS   Previous INR:  2.0 ratio on  04/01/2008 Previous Coumadin Dose(mg):  M,W,F 5MG  ALL OTHER DAYS 2.5MG  (5MG  TAB) on  02/27/2008 Previous Regimen:  M,F 5MG -2.5MG  ALL OTHER DAYS on  02/27/2008  NEW REGIMEN & LAB RESULTS Current INR: 2.4 Current Coumadin Dose(mg): 5MG  M,W,F . 2.5MG  ALL OTHER DAYS Regimen: M,F 5MG -2.5MG  ALL OTHER DAYS  (no change)  Provider: Kayston Jodoin MEDICATIONS ACTOS 45 MG  TABS (PIOGLITAZONE HCL) 1 by mouth qd METFORMIN HCL 1000 MG  TABS (METFORMIN HCL) 1bid TRICOR 145 MG  TABS (FENOFIBRATE) take one tablet daily GLIMEPIRIDE 2 MG  TABS (GLIMEPIRIDE) 1/2 q am * ALLERGY SHOTS  HYDROCHLOROTHIAZIDE 25 MG  TABS (HYDROCHLOROTHIAZIDE) 1 by mouth qd ATENOLOL 50 MG  TABS (ATENOLOL) 1 by mouth qd FLECAINIDE ACETATE 100 MG  TABS (FLECAINIDE ACETATE) 1 qam and 1 tab qpm QUINAPRIL HCL 40 MG  TABS (QUINAPRIL HCL) 1 by mouth qd COUMADIN 5 MG  TABS (WARFARIN SODIUM) take as directed ONETOUCH ULTRA TEST  STRP (GLUCOSE BLOOD) test two times a day

## 2010-02-17 NOTE — Progress Notes (Signed)
Summary: discuss med  Phone Note Call from Patient Call back at Home Phone [phone] Call back at Work Phone [phone]   Caller: Patient Reason for Call: Talk to Nurse Summary of Call: Per pt calling question regarding meds. amarondone 400 mg Initial call taken by: Lorne Skeens,  December 14, 2009 12:39 PM  Follow-up for Phone Call        changed pt's lab appt and called in new Amiodarone refill for him 400mg  two times a day to [facility] [address], RN, BSN  December 14, 2009 1:27 PM    New/Updated Medications: AMIODARONE HCL 400 MG TABS (AMIODARONE HCL) Take one tablet by mouth twice a day Prescriptions: AMIODARONE HCL 400 MG TABS (AMIODARONE HCL) Take one tablet by mouth twice a day  #60 x 5   Entered by:   Laurance Flatten CMA   Authorized by:   Hillis Range, MD   Signed by:   Laurance Flatten CMA on 12/14/2009   Method used:   Electronically to        [id number]* (retail)       [address]       [place], [place]  [address]       Ph: [phone]       Fax: [phone]   RxID:   [id number]

## 2010-02-17 NOTE — Assessment & Plan Note (Signed)
Summary: pt check/kdc  Nurse Visit   Vital Signs:  Patient profile:   65 year old male Weight:      216.2 pounds Pulse rate:   60 / minute BP sitting:   130 / 68  (left arm) Cuff size:   large  Vitals Entered By: Shonna Chock (August 13, 2008 4:13 PM)  Impression & Recommendations:  Problem # 1:  ATRIAL FIBRILLATION, CHRONIC (ICD-427.31)  His updated medication list for this problem includes:    Atenolol 50 Mg Tabs (Atenolol) .Marland Kitchen... 1 by mouth qd    Coumadin 5 Mg Tabs (Warfarin sodium) .Marland Kitchen... Take as directed    Amiodarone Hcl 400 Mg Tabs (Amiodarone hcl) .Marland Kitchen... Take one tablet by mouth twice a day for 2 weeks, then take one tablet by mouth daily  Orders: Protime ([id number])  Problem # 2:  AFTERCARE, LONG-TERM USE, ANTICOAGULANTS (ICD-V58.61)  Orders: Protime ([id number])  Complete Medication List: 1)  Actos 45 Mg Tabs (Pioglitazone hcl) .Marland Kitchen.. 1 by mouth qd 2)  Metformin Hcl 1000 Mg Tabs (Metformin hcl) .Marland Kitchen.. 1bid 3)  Tricor 145 Mg Tabs (Fenofibrate) .... Take one tablet daily 4)  Glimepiride 2 Mg Tabs (Glimepiride) .... 1/2 q am 5)  Allergy Shots  6)  Atenolol 50 Mg Tabs (Atenolol) .Marland Kitchen.. 1 by mouth qd 7)  Quinapril Hcl 40 Mg Tabs (Quinapril hcl) .Marland Kitchen.. 1 by mouth qd 8)  Coumadin 5 Mg Tabs (Warfarin sodium) .... Take as directed 9)  Onetouch Ultra Test Strp (Glucose blood) .... Test two times a day 10)  Amiodarone Hcl 400 Mg Tabs (Amiodarone hcl) .... Take one tablet by mouth twice a day for 2 weeks, then take one tablet by mouth daily   Patient Instructions: 1)  No coumadin 7/29; recheck PT/INR 08/14/08.  CC: PT CHECK   Allergies: 1)  ! * No Ivp Dye Allergy 2)  ! * No Latex Allergy 3)  ! * No Shellfish Allergy Laboratory Results   Blood Tests      INR: 6.3   (Normal Range: 0.88-1.12   Therap INR: 2.0-3.5)    Orders Added: 1)  Protime [[id number]] 2)  Est. Patient Level I [29562]   ANTICOAGULATION RECORD PREVIOUS REGIMEN & LAB RESULTS   Previous INR:  2.9 on   07/13/2008 Previous Coumadin Dose(mg):  5MG -M/F, 2.5MG  ALL OTHER DAYS on  07/06/2008 Previous Regimen:  NONE TODAY, THEN 2.5MG  DAILY on  07/06/2008 Previous Coagulation Comments:  Patient taking 2.5 mg daily, patient to continue current regimen and return to clinic in one month. on  07/13/2008  NEW REGIMEN & LAB RESULTS Current INR: 6.3 Current Coumadin Dose(mg): 2.5MG  DAILY Regimen: NONE TODAY RECHECK TOMORROW  Provider: Yitzchok Carriger MEDICATIONS ACTOS 45 MG  TABS (PIOGLITAZONE HCL) 1 by mouth qd METFORMIN HCL 1000 MG  TABS (METFORMIN HCL) 1bid TRICOR 145 MG  TABS (FENOFIBRATE) take one tablet daily GLIMEPIRIDE 2 MG  TABS (GLIMEPIRIDE) 1/2 q am * ALLERGY SHOTS  ATENOLOL 50 MG  TABS (ATENOLOL) 1 by mouth qd QUINAPRIL HCL 40 MG  TABS (QUINAPRIL HCL) 1 by mouth qd COUMADIN 5 MG  TABS (WARFARIN SODIUM) take as directed ONETOUCH ULTRA TEST  STRP (GLUCOSE BLOOD) test two times a day AMIODARONE HCL 400 MG TABS (AMIODARONE HCL) Take one tablet by mouth twice a day for 2 weeks, then take one tablet by mouth daily   Anticoagulation Visit Questionnaire      Coumadin dose missed/changed:  No      Abnormal Bleeding Symptoms:  No   Any  diet changes including alcohol intake, vegetables or greens since the last visit:  No Any illnesses or hospitalizations since the last visit:  No Any signs of clotting since the last visit (including chest discomfort, dizziness, shortness of breath, arm tingling, slurred speech, swelling or redness in leg):  No

## 2010-02-17 NOTE — Miscellaneous (Signed)
  Clinical Lists Changes  Observations: Added new observation of CXR RESULTS:  IMPRESSION:    1.  Mild cardiomegaly and slight increase in interstitial pattern   suggesting mild edema.  This may represent to mild congestive heart   failure.   2.  No focal airspace disease.    Read By:  Chauncey Fischer,  MD   Released By:  Chauncey Fischer,  MD  (01/19/2009 11:24)      CXR  Procedure date:  01/19/2009  Findings:       IMPRESSION:    1.  Mild cardiomegaly and slight increase in interstitial pattern   suggesting mild edema.  This may represent to mild congestive heart   failure.   2.  No focal airspace disease.    Read By:  Chauncey Fischer,  MD   Released By:  Chauncey Fischer,  MD

## 2010-02-17 NOTE — Miscellaneous (Signed)
  Clinical Lists Changes  Observations: Added new observation of RESULTS MISC:  1. Atrial fibrillation upon presentation.   2. Successful electrical isolation and anatomical encircling of all 4       pulmonary veins using radiofrequency current.   3. Successful ablation of complex fractionated atrial electrograms       along the roof of the left atrium, interatrial septum, base of the       left atrial appendage, and along the lateral wall of the left       atrium as well as above the coronary sinus.   4. Successful cardioversion to sinus rhythm.   5. No inducible arrhythmias following ablation both on and off of       isoproterenol.   6. No early apparent complications.  (09/22/2008 10:45)      MISC. Report  Procedure date:  09/22/2008  Findings:       1. Atrial fibrillation upon presentation.   2. Successful electrical isolation and anatomical encircling of all 4       pulmonary veins using radiofrequency current.   3. Successful ablation of complex fractionated atrial electrograms       along the roof of the left atrium, interatrial septum, base of the       left atrial appendage, and along the lateral wall of the left       atrium as well as above the coronary sinus.   4. Successful cardioversion to sinus rhythm.   5. No inducible arrhythmias following ablation both on and off of       isoproterenol.   6. No early apparent complications.

## 2010-02-17 NOTE — Progress Notes (Signed)
Summary: need codes and orders for labs for Wed 12/21  (side b appt)  Phone Note Call from Patient Call back at Home Phone [phone] Call back at Work Phone [phone]   Caller: Patient Summary of Call: patient will be here on Wednesday 12/21 for Shingles shot, then says he needs lab work, especially A1C---he says that he was on another medication  and was scheduled for labs for middle of December, but Dr [name] took him off that medication  so patient is not sure if he needs labs or not     ( if not, he may want to come in late afternoon for shingles shot ---please let me know as soon as possible---thanks) Initial call taken by: Jerolyn Shin,  December 30, 2009 4:32 PM  Follow-up for Phone Call        A1c 7.1 on 10/27/09. Dr.Eland Lamantia please advise Follow-up by: Shonna Chock CMA,  December 31, 2009 3:10 PM  Additional Follow-up for Phone Call Additional follow up Details #1::        A1c , urine microalbumin (250.02) Additional Follow-up by: [name] MD,  January 02, 2010 10:10 AM

## 2010-02-17 NOTE — Progress Notes (Signed)
Summary: Clonazepam Concerns  Phone Note Call from Patient Call back at Home Phone [phone]   Caller: Patient Summary of Call: Patient would like RX for ambien. Patient was RX'ed clonazepam 0.5mg  and it caused difficulities : tightness in chest,sob and head pressure. Patient would really like a RX for Ambien instead.  Dr.Hopper please advise./Chrae [facility]  October 02, 2008 1:31 PM   Follow-up for Phone Call        Zolpidem 5 mg q 3rd night as needed #10 Follow-up by: [name] MD,  October 02, 2008 1:48 PM  Additional Follow-up for Phone Call Additional follow up Details #1::        pt aware rx sent to pharmacy.........................Marland KitchenFelecia Deloach CMA  October 05, 2008 9:18 AM    New Allergies: ! CLONAZEPAM (CLONAZEPAM) New/Updated Medications: ZOLPIDEM TARTRATE 5 MG TABS (ZOLPIDEM TARTRATE) take 1 tab q 3rd night as needed New Allergies: ! CLONAZEPAM (CLONAZEPAM) Prescriptions: ZOLPIDEM TARTRATE 5 MG TABS (ZOLPIDEM TARTRATE) take 1 tab q 3rd night as needed  #10 x 0   Entered by:   Jeremy Johann CMA   Authorized by:   [name] MD   Signed by:   Jeremy Johann CMA on 10/05/2008   Method used:   Printed then faxed to ...       [facility] [address] [id number] (retail)       [address]       [place], [place]  [address]       Ph: [phone]       Fax: [phone]   RxID:   [id number]

## 2010-02-17 NOTE — Assessment & Plan Note (Signed)
Summary: ADJUST HIS MEDS/KDC   Vital Signs:  Patient profile:   64 year old male Weight:      228.4 pounds Pulse rate:   76 / minute Resp:     17 per minute BP sitting:   118 / 80  (left arm) Cuff size:   large  Vitals Entered By: Shonna Chock (September 29, 2008 3:07 PM) CC: 1.) Discuss meds   2.)PT check   3.) ? If lab appointment still needed on 09/22  4.) FYI: Cardioversion tomorrow Comments REVIEWED MED LIST, PATIENT AGREED DOSE AND INSTRUCTION CORRECT    Primary Care Provider:  [name] MD  CC:  1.) Discuss meds   2.)PT check   3.) ? If lab appointment still needed on 09/22  4.) FYI: Cardioversion tomorrow.  History of Present Illness: Atrial flutter occurred 36 hrs  after ablation for Atrial Fib. Cardioversion planned 09/30/2008; INR was 3.1 on 9/13 & 3.3 today on 1 mg M& F and 2 mg all other days.  Bexcause of elevated creatinine (2.4 on 8/30) , Metformin D/Ced several weeks ago. Creatinine decreased @ F/U post ablation to 1.5. Meds reviewed ; Tricor may affect creatinine. He is not on low carb diet. Low glycemic index & load program discussed.  Allergies: 1)  ! * No Ivp Dye Allergy 2)  ! * No Latex Allergy 3)  ! * No Shellfish Allergy  Review of Systems General:  Complains of sleep disorder. Resp:  Denies excessive snoring, hypersomnolence, and morning headaches. Psych:  Complains of anxiety.  Physical Exam  General:  in no acute distress; alert,appropriate and cooperative throughout examination Lungs:  Normal respiratory effort, chest expands symmetrically. Lungs are clear to auscultation, no crackles or wheezes. Heart:  irregular rhythm.   Abdomen:  Bowel sounds positive,abdomen soft and non-tender without masses, organomegaly or hernias noted. Pulses:  R and L carotid,radial,dorsalis pedis and posterior tibial pulses are full and equal bilaterally Extremities:  No clubbing, cyanosis, edema. Neurologic:  sensation intact to light touch over feet.   Skin:   Intact without suspicious lesions or rashes   Impression & Recommendations:  Problem # 1:  SLEEP DISORDER, CHRONIC (ICD-780.50) in context of protracted cardiac procedures  Problem # 2:  RENAL INSUFFICIENCY (ICD-588.9) improving  Problem # 3:  HYPERLIPIDEMIA (ICD-272.4)  The following medications were removed from the medication list:    Tricor 145 Mg Tabs (Fenofibrate) .Marland Kitchen... Take one tablet daily  Problem # 4:  DIABETES MELLITUS, TYPE II (ICD-250.00)  His updated medication list for this problem includes:    Actos 45 Mg Tabs (Pioglitazone hcl) .Marland Kitchen... 1 by mouth qd    Glimepiride 2 Mg Tabs (Glimepiride) .Marland Kitchen... 1/2 q am    Quinapril Hcl 40 Mg Tabs (Quinapril hcl) .Marland Kitchen... 1 by mouth qd  Complete Medication List: 1)  Actos 45 Mg Tabs (Pioglitazone hcl) .Marland Kitchen.. 1 by mouth qd 2)  Glimepiride 2 Mg Tabs (Glimepiride) .... 1/2 q am 3)  Allergy Shots  4)  Quinapril Hcl 40 Mg Tabs (Quinapril hcl) .Marland Kitchen.. 1 by mouth qd 5)  Coumadin 1 Mg Tabs (Warfarin sodium) .... Take 2 tablets by mouth once daily as directed 6)  Onetouch Ultra Test Strp (Glucose blood) .... Test two times a day 7)  Amiodarone Hcl 200 Mg Tabs (Amiodarone hcl) .Marland Kitchen.. 1 by mouth once daily 8)  Clonazepam 0.5 Mg Tabs (Clonazepam) .Marland Kitchen.. 1 at bedtime as needed  Patient Instructions: 1)  Please schedule a follow-up appointment in 3 months. 2)  NMR Lipoprofile Lipid Panel prior to visit, ICD-9: See diagnoses for Codes 3)  HbgA1C prior to visit, ICD-9: 4)  BUN,creat ,K+ prior to visit, ICD-9: 5)  Hepatic Panel prior to visit, ICD-9: Follow the low glycemic index & load  program as discussed Prescriptions: CLONAZEPAM 0.5 MG TABS (CLONAZEPAM) 1 at bedtime as needed  #30 x 2   Entered and Authorized by:   [name] MD   Signed by:   [name] MD on 09/29/2008   Method used:   Print then Give to Patient   RxID:   [id number]    ANTICOAGULATION RECORD PREVIOUS REGIMEN & LAB RESULTS Anticoagulation Diagnosis:  Atrial  fibrillation on  09/04/2008 Previous INR Goal Range:  2-3 on  09/15/2008 Previous INR:  1.9 ratio on  09/14/2008 Previous Coumadin Dose(mg):  1mg  on  09/18/2008 Previous Regimen:  1mg  daily except 2 mg on tues, thurs, sat on  08/28/2008 Previous Coagulation Comments:  Patient taking 2.5 mg daily, patient to continue current regimen and return to clinic in one month. on  07/13/2008  NEW REGIMEN & LAB RESULTS Current INR: 3.3 Current Coumadin Dose(mg): M/F 1MG , ALL OTHERS 2MG  Regimen: 1mg  daily except 2 mg on tues, thurs, sat  (no change)  Provider: Beauden Tremont Other Comments: Patient was 3.1  MEDICATIONS ACTOS 45 MG  TABS (PIOGLITAZONE HCL) 1 by mouth qd GLIMEPIRIDE 2 MG  TABS (GLIMEPIRIDE) 1/2 q am * ALLERGY SHOTS  QUINAPRIL HCL 40 MG  TABS (QUINAPRIL HCL) 1 by mouth qd COUMADIN 1 MG TABS (WARFARIN SODIUM) Take 2 tablets by mouth once daily as directed [BMN] ONETOUCH ULTRA TEST  STRP (GLUCOSE BLOOD) test two times a day AMIODARONE HCL 200 MG TABS (AMIODARONE HCL) 1 by mouth once daily CLONAZEPAM 0.5 MG TABS (CLONAZEPAM) 1 at bedtime as needed   Anticoagulation Visit Questionnaire      Coumadin dose missed/changed:  No      Abnormal Bleeding Symptoms:  No   Any diet changes including alcohol intake, vegetables or greens since the last visit:  No Any illnesses or hospitalizations since the last visit:  Yes Any signs of clotting since the last visit (including chest discomfort, dizziness, shortness of breath, arm tingling, slurred speech, swelling or redness in leg):  No    Appended Document: Orders Update    Clinical Lists Changes  Orders: Added new Service order of [facility] [phone]) - Signed

## 2010-02-17 NOTE — Progress Notes (Signed)
Summary: RX  Phone Note Call from Patient   Summary of Call: PT WIFE  CAME IN AND DROP OFF A PRESCRIPT RENEWAL FOR GLIMEPIRIDE 2 MG AND QUINIPRIL 40 MG TO BE FAX TO [phone]. PT NEEDS THIS DONE TODAY HE IS ALMOST OUT. Initial call taken by: Freddy Jaksch,  September 06, 2009 11:53 AM  Follow-up for Phone Call        RX's faxed to:  [phone] Follow-up by: Shonna Chock CMA,  September 06, 2009 12:06 PM    Prescriptions: QUINAPRIL HCL 40 MG  TABS (QUINAPRIL HCL) 1 by mouth qd  #90 x 2   Entered by:   Shonna Chock CMA   Authorized by:   [name] MD   Signed by:   Shonna Chock CMA on 09/06/2009   Method used:   Print then Give to Patient   RxID:   [id number] GLIMEPIRIDE 2 MG  TABS (GLIMEPIRIDE) 1/2 q am  #90 x 2   Entered by:   Shonna Chock CMA   Authorized by:   [name] MD   Signed by:   Shonna Chock CMA on 09/06/2009   Method used:   Print then Give to Patient   RxID:   [id number]

## 2010-02-17 NOTE — Progress Notes (Signed)
Summary: see note pt scheduled  Phone Note Call from Patient Call back at Work Phone [phone]   Caller: Patient Summary of Call: PATIENT SAYS HE WAS TOLD BY DR Jenel Lucks OFFICE THAT HE NEEDS TO HAVE WEEKLY BLOODWORK STARTING TODAY AND WILL NEED EVERY WEEK UNTIL 10/18 WHEN HE HAS HIS "PROCEDURE"---HE SAYS THAT DR ALLRED'S OFFICE WOULD SEND OVER THE LAB WORK---HE DOES NOT HAVE ANY IDEA WHAT TYPE OF LAB WORK IS NEEDED  WANTED TO COME TODAY AT NOON TO HAVE LABS DONE--I SAID WE WOULD CONTACT HIM AS SOON AS WE KNOW WHAT LABS TO DRAW SO WE CAN SET UP A LAB APPT---SAID, DUE TO WORK ISSUES, TOMORROW IS A VERY BAD DAY  NEED TO CALL HIM  AT WORK [phone] TO SET UP LAB APPT Initial call taken by: Jerolyn Shin,  October 04, 2009 9:46 AM  Follow-up for Phone Call        spoke with patient he is having ablation in oct - needs pt monitored weekly - scheculed lab appt today [phone] .Marland KitchenOkey Regal [place]  October 04, 2009 11:44 AM

## 2010-02-17 NOTE — Medication Information (Signed)
Summary: rov/ewj  Anticoagulant Therapy  Managed by: Weston Brass, PharmD Referring MD: [name], MD PCP: [name] MD Supervising MD: Excell Seltzer MD, Casimiro Needle Indication 1: Atrial fibrillation Indication 2: Ablation pending Lab Used: [facility] Site: [address] INR POC 3.2 INR RANGE 2-3  Dietary changes: no    Health status changes: yes       Details: Came in today in atrial flutter after ablation last week  Bleeding/hemorrhagic complications: no    Recent/future hospitalizations: yes       Details: Had ablation last week  Any changes in medication regimen? yes       Details: Decreased amiodarone dose last week  Recent/future dental: no  Any missed doses?: no       Is patient compliant with meds? yes       Allergies: 1)  ! * No Ivp Dye Allergy 2)  ! * No Latex Allergy 3)  ! * No Shellfish Allergy  Anticoagulation Management History:      The patient is taking warfarin and comes in today for a routine follow up visit.  Positive risk factors for bleeding include presence of serious comorbidities.  Negative risk factors for bleeding include an age less than 61 years old and no history of CVA/TIA.  The bleeding index is 'intermediate risk'.  Positive CHADS2 values include History of HTN and History of Diabetes.  Negative CHADS2 values include Age > 41 years old and Prior Stroke/CVA/TIA.  His last INR was 1.9 ratio.  Anticoagulation responsible provider: Excell Seltzer MD, Casimiro Needle.  INR POC: 3.2.  Exp: 10/2009.    Anticoagulation Management Assessment/Plan:      The patient's current anticoagulation dose is Coumadin 1 mg tabs: Take 2 tablets by mouth once daily as directed.  The next INR is due 09/18/2008.  Anticoagulation instructions were given to patient.  Results were reviewed/authorized by Weston Brass, PharmD.  He was notified by Weston Brass PharmD.         Prior Anticoagulation Instructions: INR 2.5  Take 2 mg daily except 1mg  on Mondays and Fridays.     Current Anticoagulation Instructions: INR 3.2   Continue current dose.  Tresa Endo, RN to call with instructions

## 2010-02-17 NOTE — Progress Notes (Signed)
Summary: f/u on cardioversion (pt to c/b 11/18)  Phone Note Call from Patient Call back at [facility] Phone [phone]   Caller: Patient [phone] Reason for Call: Talk to Nurse Details for Reason: f/u test on cardioversion.  Initial call taken by: Lorne Skeens,  December 02, 2009 9:26 AM  Follow-up for Phone Call        I called and spoke with the pt. He states he went back out of rhythm last night. He does not think he is fast, but is irregular. He states he has the capability to have an EKG at work and he will try to do this today and fax it to me. I will then try to call and speak with Dr. Johney Frame since he is in the hospital today, to see if there is anything he would like to do today. The pt is agreeable with the above. Follow-up by: Sherri Rad, RN, BSN,  December 02, 2009 9:40 AM  Additional Follow-up for Phone Call Additional follow up Details #1::        EKG received. Waiting on c/b from Dr. Johney Frame. He is in a procedure now. Sherri Rad, RN, BSN  December 02, 2009 11:24 AM   I spoke with Dr. Johney Frame and explained the pt is back in a-fib. He wants to leave the pt's meds alone for now, but he would like the pt. to call back tomorrow and speak with [facility] and let us know if he feels   like he is still out of rhythm or not. Per Dr. Johney Frame, he will make further decisions after that. The pt's 12/13/09 office visit was cancelled, so Dr. Johney Frame is aware the pt has no f/u appt . scheduled at present. I have called the pt and explained this to him. He is agreeable. Sherri Rad, RN, BSN  December 02, 2009 12:42 PM  PT REQUEST Gita Kudo  December 03, 2009 10:08 AM    Additional Follow-up for Phone Call Additional follow up Details #2::    spoke with pt this morning and he staes he feels like his heart is not beating as hard but it is still out of rhythm.  I discussed with Dr Johney Frame and he said to stop Flecainide and restart Amiodarone 400mg  two times a day and follow up  with Dr Johney Frame in 2 weeks.  LMOM for pt to call me back on Mon. Dennis Bast, RN, BSN  December 03, 2009 5:59 PM

## 2010-02-17 NOTE — Cardiovascular Report (Signed)
Summary: Elective Cardioversion Orders  Elective Cardioversion Orders   Imported By: Kassie Mends 10/26/2008 12:21:38  _____________________________________________________________________  External Attachment:    Type:   Image     Comment:   External Document

## 2010-02-17 NOTE — Assessment & Plan Note (Signed)
Summary: pt/cbs  Nurse Visit   Vital Signs:  Patient Profile:   65 Years Old Male CC:      PT CHECK Weight:      223.6 pounds Pulse rate:   72 / minute BP sitting:   120 / 70  (left arm) Cuff size:   large  Vitals Entered By: Shonna Chock (February 27, 2008 4:10 PM)                 Prior Medications: ACTOS 45 MG  TABS (PIOGLITAZONE HCL) 1 by mouth qd METFORMIN HCL 1000 MG  TABS (METFORMIN HCL) 1bid TRICOR 145 MG  TABS (FENOFIBRATE) take one tablet daily GLIMEPIRIDE 2 MG  TABS (GLIMEPIRIDE) 1/2 q am ALLERGY SHOTS ()  HYDROCHLOROTHIAZIDE 25 MG  TABS (HYDROCHLOROTHIAZIDE) 1 by mouth qd ATENOLOL 50 MG  TABS (ATENOLOL) 1 by mouth qd FLECAINIDE ACETATE 100 MG  TABS (FLECAINIDE ACETATE) 1 and 1/2 tab qam and 1 tab qpm QUINAPRIL HCL 40 MG  TABS (QUINAPRIL HCL) 1 by mouth qd COUMADIN 5 MG  TABS (WARFARIN SODIUM) take as directed ONETOUCH ULTRA TEST  STRP (GLUCOSE BLOOD) test two times a day Current Allergies: No known allergies  Laboratory Results   Blood Tests      INR: 2.9   (Normal Range: 0.88-1.12   Therap INR: 2.0-3.5)      Orders Added: 1)  Est. Patient Level I [04540] 2)  [facility] Ila.Stager    ]  ANTICOAGULATION RECORD PREVIOUS REGIMEN & LAB RESULTS   Previous INR:  2.3 on  01/24/2008 Previous Coumadin Dose(mg):  T,SAT-5MG , 2.5MG  ALL OTHER DAYS on  01/24/2008 Previous Regimen:  2.5MG  T,TH,SAT-5MG  ALL OTHER DAYS on  01/24/2008  NEW REGIMEN & LAB RESULTS Current INR: 2.9 Current Coumadin Dose(mg): M,W,F 5MG  ALL OTHER DAYS 2.5MG  (5MG  TAB) Regimen: M,F 5MG -2.5MG  ALL OTHER DAYS  Provider: Carli Lefevers      Repeat testing in: 4 WEEKS Other Comments: PATIENT WOULD LIKE FOR NEXT PT CHECK TO BE INCLUDED WITH A1C CHECK FOR THE LAB.  MEDICATIONS ACTOS 45 MG  TABS (PIOGLITAZONE HCL) 1 by mouth qd METFORMIN HCL 1000 MG  TABS (METFORMIN HCL) 1bid TRICOR 145 MG  TABS (FENOFIBRATE) take one tablet daily GLIMEPIRIDE 2 MG  TABS (GLIMEPIRIDE) 1/2 q am *  ALLERGY SHOTS  HYDROCHLOROTHIAZIDE 25 MG  TABS (HYDROCHLOROTHIAZIDE) 1 by mouth qd ATENOLOL 50 MG  TABS (ATENOLOL) 1 by mouth qd FLECAINIDE ACETATE 100 MG  TABS (FLECAINIDE ACETATE) 1 and 1/2 tab qam and 1 tab qpm QUINAPRIL HCL 40 MG  TABS (QUINAPRIL HCL) 1 by mouth qd COUMADIN 5 MG  TABS (WARFARIN SODIUM) take as directed ONETOUCH ULTRA TEST  STRP (GLUCOSE BLOOD) test two times a day   Anticoagulation Visit Questionnaire      Coumadin dose missed/changed:  No      Abnormal Bleeding Symptoms:  No   Any diet changes including alcohol intake, vegetables or greens since the last visit:  No Any illnesses or hospitalizations since the last visit:  No Any signs of clotting since the last visit (including chest discomfort, dizziness, shortness of breath, arm tingling, slurred speech, swelling or redness in leg):  No      Impression & Recommendations:  Problem # 1:  ENCOUNTER FOR THERAPEUTIC DRUG MONITORING (ICD-V58.83)  Orders: [facility] ([id number])   Problem # 2:  PAROXYSMAL ATRIAL FIBRILLATION (ICD-427.31)  His updated medication list for this problem includes:    Atenolol 50 Mg Tabs (Atenolol) .Marland Kitchen... 1 by mouth qd  Flecainide Acetate 100 Mg Tabs (Flecainide acetate) .Marland Kitchen... 1 and 1/2 tab qam and 1 tab qpm    Coumadin 5 Mg Tabs (Warfarin sodium) .Marland Kitchen... Take as directed  Orders: [facility] ([id number])   Complete Medication List: 1)  Actos 45 Mg Tabs (Pioglitazone hcl) .Marland Kitchen.. 1 by mouth qd 2)  Metformin Hcl 1000 Mg Tabs (Metformin hcl) .Marland Kitchen.. 1bid 3)  Tricor 145 Mg Tabs (Fenofibrate) .... Take one tablet daily 4)  Glimepiride 2 Mg Tabs (Glimepiride) .... 1/2 q am 5)  Allergy Shots  6)  Hydrochlorothiazide 25 Mg Tabs (Hydrochlorothiazide) .Marland Kitchen.. 1 by mouth qd 7)  Atenolol 50 Mg Tabs (Atenolol) .Marland Kitchen.. 1 by mouth qd 8)  Flecainide Acetate 100 Mg Tabs (Flecainide acetate) .Marland Kitchen.. 1 and 1/2 tab qam and 1 tab qpm 9)  Quinapril Hcl 40 Mg Tabs (Quinapril hcl) .Marland Kitchen.. 1 by mouth qd 10)  Coumadin 5 Mg Tabs  (Warfarin sodium) .... Take as directed 11)  Onetouch Ultra Test Strp (Glucose blood) .... Test two times a day   Patient Instructions: 1)  New regimine: 2.5 mg once daily EXCEPT 5 mg on Mon& Fri. PT/INR with A1c & urine microalbumin in 4 weeks.

## 2010-02-17 NOTE — Progress Notes (Signed)
  Phone Note Outgoing Call   Call placed by: Duncan Dull, RN, BSN,  November 24, 2008 12:29 PM Call placed to: Patient Summary of Call: Will have labs at the hosp. TEE @ 1130 and DCCV @ 1200 with Dr. Tenny Craw. Pt is aware. Initial call taken by: Duncan Dull, RN, BSN,  November 24, 2008 12:32 PM

## 2010-02-17 NOTE — Letter (Signed)
Summary: Results Follow-up Letter  Laverne at [facility]  [address] [place], [place] [address]   Phone: [phone]  Fax: [phone]    04/24/2007        [name] [address] [place], [place]  [address]  Dear Mr. Mittelstaedt,   The following are the results of your recent test(s):  Test     Result     Pap Smear    Normal_______  Not Normal_____       Comments: _________________________________________________________ Cholesterol LDL(Bad cholesterol):          Your goal is less than:         HDL (Good cholesterol):        Your goal is more than: _________________________________________________________ Other Tests:   _________________________________________________________  Please call for an appointment Or __Please see attached._______________________________________________________ _________________________________________________________ _________________________________________________________  Sincerely,  Ardyth Man Redfield at [facility]

## 2010-02-17 NOTE — Progress Notes (Signed)
Summary: IN AND OUT OF AFIB SINCE PROCEDURE  Phone Note Call from Patient Call back at Work Phone [phone]   Caller: Patient Reason for Call: Talk to Nurse Summary of Call: PATIENT IN AND OUT OF AFIB SINCE PROCEDURE Initial call taken by: Burnard Leigh,  June 18, 2008 10:55 AM  Follow-up for Phone Call        Discussed with Dr. [name], offer pt Amiodarone for the short term until can be seen by Dr. Johney Frame for afib ablation.  Spoke with patient, he would like to continue with Tikosyn since only short runs of afib until after daughter's wedding in July.  Follow-up by: Gypsy Balsam RN BSN,  June 18, 2008 1:29 PM

## 2010-02-17 NOTE — Progress Notes (Signed)
Summary: Labs Concerns  Phone Note Call from Patient Call back at Work Phone [phone]   Caller: Patient Summary of Call: Patient called to discuss his last lab values, Dr.Shamere Dilworth indicated for him to follow-up to discuss DM poorly controlled. Patient said he was given samples of Trjenta 5mg  and told to not begin until labs come back and questions if he should come in or just begin samples.   Dr.Talli Kimmer please advise./Chrae Marlynn Perking CMA  November 04, 2009 9:43 AM   Follow-up for Phone Call        absolutely ; recheck BUN, creat, K+ & A1c after 8 weeks of Trajenta Follow-up by: [name] MD,  November 04, 2009 1:03 PM  Additional Follow-up for Phone Call Additional follow up Details #1::        Patient notified and will call back for lab visit. Additional Follow-up by: Lucious Groves CMA,  November 04, 2009 4:14 PM

## 2010-02-17 NOTE — Procedures (Signed)
Summary: Colonoscopy  Colonoscopy   Imported By: Freddy Jaksch 05/17/2007 16:36:52  _____________________________________________________________________  External Attachment:    Type:   Image     Comment:   External Document

## 2010-02-17 NOTE — Medication Information (Signed)
Summary: Possible Noncompliance with Atenolol/[employer]  Possible Noncompliance with Atenolol/[employer]   Imported By: Lanelle Bal 08/14/2007 08:29:30  _____________________________________________________________________  External Attachment:    Type:   Image     Comment:   External Document

## 2010-02-17 NOTE — Progress Notes (Signed)
Summary: Recent Labs-lmom  Phone Note Outgoing Call Call back at [facility] Phone [phone]   Call placed by: Shonna Chock,  April 08, 2008 2:00 PM Call placed to: Patient Summary of Call: Left message on machine:  Reason for call:  Excellent Diabetes control; recheck A1c in 6 months. PT/INR 2.0 , increase coumadin to 5 mg M,W,&F , 2.5 mg all other days . Check 4 weeks.  Chrae Malloy  April 08, 2008 2:01 PM    Follow-up for Phone Call        left message on machine ..........Marland KitchenDoristine Devoid  April 08, 2008 4:20 PM   Additional Follow-up for Phone Call Additional follow up Details #1::        D/W PATIENT, PATIENT OK'D ALL INFORMATION . PATIENT WILL CALL BACK TO SCHEDULE APPOINTMENT. Additional Follow-up by: Shonna Chock,  April 09, 2008 10:51 AM

## 2010-02-17 NOTE — Miscellaneous (Signed)
  Clinical Lists Changes  Medications: Removed medication of PROPAFENONE HCL 150 MG TABS (PROPAFENONE HCL) Take one tablet by mouth every 8 hours

## 2010-02-17 NOTE — Assessment & Plan Note (Signed)
Summary: per check out/sf   Visit Type:  Follow-up Referring Provider:  Berton Mount, MD Primary Provider:  [name] MD   History of Present Illness: The patient presents today for routine electrophysiology followup. He reports doing very well since his last visit to our clinic.  He has had no afib since last being seen in our office.  He is very pleased with the results of his ablation.  He denies any postprocedure complications.  The patient denies symptoms of palpitations, chest pain, shortness of breath, orthopnea, PND, dizziness, presyncope, syncope, or neurologic sequela. His SOB and fatigue have improved tremendously with sinus rhythm.   He reports occasional mild edema when he has consumed salt.  The patient is tolerating medications without difficulties and is otherwise without complaint today.   Current Medications (verified): 1)  Actos 45 Mg  Tabs (Pioglitazone Hcl) .Marland Kitchen.. 1 By Mouth Qd 2)  Glimepiride 2 Mg  Tabs (Glimepiride) .... 1/2 Q Am 3)  Allergy Shots .... Twice Per Month 4)  Quinapril Hcl 40 Mg  Tabs (Quinapril Hcl) .Marland Kitchen.. 1 By Mouth Qd 5)  Coumadin 5 Mg Tabs (Warfarin Sodium) .... Daw Uad 6)  Onetouch Ultra Test  Strp (Glucose Blood) .... Test Two Times A Day 7)  Multivitamins  Caps (Multiple Vitamin) .... Take 2 Daily 8)  Flecainide Acetate 150 Mg Tabs (Flecainide Acetate) .... 1/2 Tab Two Times A Day 9)  Hydrochlorothiazide 25 Mg Tabs (Hydrochlorothiazide) .... Take One Tablet By Mouth Daily.  Allergies: 1)  ! * No Ivp Dye Allergy 2)  ! * No Latex Allergy 3)  ! * No Shellfish Allergy 4)  ! Clonazepam (Clonazepam)  Past History:  Past Medical History: Reviewed history from 10/26/2008 and no changes required. renal calculus OP 2005 Diabetes mellitus, type II Hyperlipidemia Hypertension Persistent atrial fibrillation s/p ablation 09/22/08  Past Surgical History: Reviewed history from 10/26/2008 and no changes required. THR 7/08  Dr Charlann Boxer; cath 2005 non  obstructive CAD Afib ablation 09/22/08  Social History: Reviewed history from 07/31/2008 and no changes required. Pt lives in [place].  He works at [employer].  He previously traveled frequently with his job, but less recently.  Tob- quit 1976.  ETOH- none,  Drugs- none  Review of Systems       All systems are reviewed and negative except as listed in the HPI.   Vital Signs:  Patient profile:   65 year old male Height:      68 inches Weight:      235 pounds Pulse rate:   80 / minute BP sitting:   160 / 100  (left arm)  Vitals Entered By: Laurance Flatten CMA (April 05, 2009 4:34 PM)  Physical Exam  General:  Well developed, well nourished, in no acute distress. Head:  normocephalic and atraumatic Eyes:  PERRLA/EOM intact; conjunctiva and lids normal. Mouth:  Teeth, gums and palate normal. Oral mucosa normal. Neck:  Neck supple, no JVD. No masses, thyromegaly or abnormal cervical nodes. Lungs:  Clear bilaterally to auscultation and percussion. Heart:  Non-displaced PMI, chest non-tender; regular rate and rhythm, S1, S2 without murmurs, rubs or gallops. Carotid upstroke normal, no bruit. Normal abdominal aortic size, no bruits. Femorals normal pulses, no bruits. Pedals normal pulses. No edema, no varicosities. Abdomen:  Bowel sounds positive; abdomen soft and non-tender without masses, organomegaly, or hernias noted. No hepatosplenomegaly. Msk:  Back normal, normal gait. Muscle strength and tone normal. Pulses:  pulses normal in all 4 extremities Extremities:  No clubbing or cyanosis.  no edema today Neurologic:  alert & oriented X3 and DTRs symmetrical and normal.   No tremor  Skin:  Intact without lesions or rashes. Cervical Nodes:  no significant adenopathy Psych:  Normal affect.   EKG  Procedure date:  04/05/2009  Findings:      sinus rhythm 80 bpm, RBBB,  otherwise normal ekg  Impression & Recommendations:  Problem # 1:  ATRIAL FIBRILLATION  (ICD-427.31) Doing very well s/p ablation without episodes of atrial fibrillation.  Due to the difficulty prior to ablation achieving sinus rhythm, he wishes to continue flecainide and coumadin at this time.  We will therefore continue low dose flecainide (75mg  two times a day).   His CHADS2 score is 2.  We will therefore continue coumadin until we are certain that he is having no further atrial fibrillation. No changes today  Problem # 2:  HYPERTENSION (ICD-401.9) above goal today will consider additional medicine if BP remains elevated tight BP control is essential to prevent recurrence of afib s/p ablation weight loss and salt restriction are advised at this time  Problem # 3:  AFTERCARE, LONG-TERM USE, ANTICOAGULANTS (ICD-V58.61) CHADS2 score is 2 (HTN and DM) continue coumadin  Problem # 4:  EDEMA (ICD-782.3) occasional mild edema salt restriction  Other Orders: T-C-Reactive Protein [phone]) T-ESR [facility]) [phone]) T-Rheumatoid Factor [phone]) Jackie Plum [phone]) T-Lupus Anticoagulant ([phone]) T-HLA B27 ([phone])  Patient Instructions: 1)  Your physician recommends that you schedule a follow-up appointment in: 6 months with Dr Johney Frame 2)  Your physician recommends that you return for lab work today per Dr Charlann Boxer Prescriptions: FLECAINIDE ACETATE 150 MG TABS (FLECAINIDE ACETATE) 1/2 tab two times a day  #90 x 30   Entered by:   Dennis Bast, RN, BSN   Authorized by:   Hillis Range, MD   Signed by:   Dennis Bast, RN, BSN on 04/05/2009   Method used:   Faxed to ...       [facility] (mail-order)             , [place]    [place]       Ph:        Fax: [phone]   RxID:   [id number] HYDROCHLOROTHIAZIDE 25 MG TABS (HYDROCHLOROTHIAZIDE) Take one tablet by mouth daily.  #90 x 3   Entered by:   Dennis Bast, RN, BSN   Authorized by:   Hillis Range, MD   Signed by:   Dennis Bast, RN, BSN on 04/05/2009   Method used:   Faxed to ...        [facility]) (mail-order)             , [place]    [place]       Ph:        Fax: [phone]   RxID:   [id number]

## 2010-02-17 NOTE — Progress Notes (Signed)
Summary: PT/INR Results  Phone Note Outgoing Call Call back at Home Phone [phone]   Call placed by: Shonna Chock,  November 24, 2008 9:21 AM Call placed to: Patient Summary of Call: Left message on machine(verified number dialed correctly) with PT/INR results:  No change ; recheck 1 month   Patient to call if any questions or concerns./[facility]  November 24, 2008 9:22 AM

## 2010-02-17 NOTE — Progress Notes (Signed)
Summary: [facility]ctz refill [facility]  Phone Note Refill Request Call back at [phone] ref #[id number] Message from:  Pharmacy  Refills Requested: Medication #1:  HYDROCHLOROTHIAZIDE 25 MG  TABS 1 by mouth qd Initial call taken by: Kandice Hams,  February 04, 2008 3:22 PM Caller: [facility]  Follow-up for Phone Call        Spoke with [facility], says disregard have order for this med .Kandice Hams  February 05, 2008 8:25 AM  Follow-up by: Kandice Hams,  February 05, 2008 8:25 AM

## 2010-02-17 NOTE — Letter (Signed)
Summary: Discharge Summary  Discharge Summary   Imported By: Freddy Jaksch 08/27/2006 09:24:38  _____________________________________________________________________  External Attachment:    Type:   Image     Comment:   DISCHARGE SUMMARY

## 2010-02-17 NOTE — Letter (Signed)
Summary: Letter  Letter   Imported By: Kassie Mends 08/03/2008 09:13:15  _____________________________________________________________________  External Attachment:    Type:   Image     Comment:   External Document

## 2010-02-17 NOTE — Letter (Signed)
Summary: Results Follow up Letter  [facility] [place]  [address] [place], [place] [address]   Phone: [phone]  Fax: [phone]    05/01/2007 MRN: [id number]  [facility] [address] [place], [place]  [address]  Dear Mr. Spearing,  The following are the results of your recent test(s):  Test         Result    Pap Smear:        Normal _____  Not Normal _____ Comments: ______________________________________________________ Cholesterol: LDL(Bad cholesterol):         Your goal is less than:         HDL (Good cholesterol):       Your goal is more than: Comments:  ______________________________________________________ Mammogram:        Normal _____  Not Normal _____ Comments:  ___________________________________________________________________ Hemoccult:        Normal _____  Not normal _______ Comments:    _____________________________________________________________________ Other Tests:  Please see attached results and comments   We routinely do not discuss normal results over the telephone.  If you desire a copy of the results, or you have any questions about this information we can discuss them at your next office visit.   Sincerely,

## 2010-02-17 NOTE — Assessment & Plan Note (Signed)
Summary: recheck per kelly/mt   Visit Type:  Follow-up Referring Provider:  Berton Mount, MD Primary Provider:  [name] MD   History of Present Illness: The patient presents today for routine electrophysiology followup. He reports recent recurrence of afib.  He reports symptoms of fatigue and decreased exercise tolerance with afib.   The patient denies symptoms of palpitations, chest pain, orthopnea, PND, dizziness, presyncope, syncope, or neurologic sequela. His SOB and fatigue have improved tremendously with sinus rhythm.   He reports occasional mild edema when he has consumed salt.  The patient is tolerating medications without difficulties and is otherwise without complaint today.   Current Medications (verified): 1)  Glimepiride 2 Mg  Tabs (Glimepiride) .... 1/2 Q Am 2)  Allergy Shots .... Twice Per Month 3)  Quinapril Hcl 40 Mg  Tabs (Quinapril Hcl) .Marland Kitchen.. 1 By Mouth Qd 4)  Coumadin 5 Mg Tabs (Warfarin Sodium) .... Daw Uad 5)  Onetouch Ultra Test  Strp (Glucose Blood) .... Test Two Times A Day 6)  Multivitamins  Caps (Multiple Vitamin) .... Take 2 Daily 7)  Flecainide Acetate 150 Mg Tabs (Flecainide Acetate) .Marland Kitchen.. 1 By Mouth Am, 1/2 By Mouth Afternoon 8)  Hydrochlorothiazide 25 Mg Tabs (Hydrochlorothiazide) .... Take One Tablet By Mouth Daily. 9)  Atenolol 50 Mg Tabs (Atenolol) .Marland Kitchen.. 1 By Mouth Once Daily  Allergies: 1)  ! * No Ivp Dye Allergy 2)  ! * No Latex Allergy 3)  ! * No Shellfish Allergy 4)  ! Clonazepam (Clonazepam)  Past History:  Past Medical History: Reviewed history from 10/26/2008 and no changes required. renal calculus OP 2005 Diabetes mellitus, type II Hyperlipidemia Hypertension Persistent atrial fibrillation s/p ablation 09/22/08  Past Surgical History: Reviewed history from 10/26/2008 and no changes required. THR 7/08  Dr Charlann Boxer; cath 2005 non obstructive CAD Afib ablation 09/22/08  Family History: Reviewed history from 09/06/2007 and no changes  required. Father: DM,CVA Mother: lung CA Siblings: bro testicular CA no FH premature CAD  Social History: Reviewed history from 07/31/2008 and no changes required. Pt lives in [place].  He works at [employer].  He previously traveled frequently with his job, but less recently.  Tob- quit 1976.  ETOH- none,  Drugs- none  Review of Systems       All systems are reviewed and negative except as listed in the HPI.   Vital Signs:  Patient profile:   65 year old male Height:      68 inches Weight:      235 pounds BMI:     35.86 Pulse rate:   91 / minute BP sitting:   118 / 82  (left arm)  Vitals Entered By: Laurance Flatten CMA (September 08, 2009 9:29 AM)  Physical Exam  General:  well-nourished; alert,appropriate and cooperative throughout examination Head:  normocephalic and atraumatic Eyes:  PERRLA/EOM intact; conjunctiva and lids normal. Mouth:  Teeth, gums and palate normal. Oral mucosa normal. Neck:  Neck supple, no JVD. No masses, thyromegaly or abnormal cervical nodes. Lungs:  Clear bilaterally to auscultation and percussion. Heart:  iRRR, no m/r/g Abdomen:  Bowel sounds positive; abdomen soft and non-tender without masses, organomegaly, or hernias noted. No hepatosplenomegaly. Msk:  Back normal, normal gait. Muscle strength and tone normal. Pulses:  pulses normal in all 4 extremities Extremities:  No clubbing or cyanosis.  no edema Neurologic:  Alert and oriented x 3. Skin:  Intact without lesions or rashes.   EKG  Procedure date:  09/08/2009  Findings:  afib,  V rate 90 bpm, RBBB  Impression & Recommendations:  Problem # 1:  ATRIAL FIBRILLATION (ICD-427.31) The patient has had recurrent of afib with flecainide s/p ablation 9/10.  He did very well for a year post ablation.  He has previously failed medical therapy with tikosyn, multaq, and amiodarone.  Therapeutic strategies for afib including medicine and ablation were discussed in detail with the  patient today. Risk, benefits, and alternatives to EP study and radiofrequency ablation for afib were also discussed in detail today. These risks include but are not limited to stroke, bleeding, vascular damage, tamponade, perforation, damage to the esophagus, lungs, and other structures, pulmonary vein stenosis, worsening renal function, and death. The patient understands these risk and wishes to proceed.  We will plan repeat catheter ablation at the next available time.  He will need cardiac CT prior to ablation to evaluate pulmonary venous anatomy and to rule out pulmonary vein stenosis.  Problem # 2:  HYPERTENSION (ICD-401.9) stable  Problem # 3:  HYPERLIPIDEMIA (ICD-272.4) stable

## 2010-02-17 NOTE — Progress Notes (Signed)
Summary: Recent Labs/Appointment  Phone Note Outgoing Call Call back at Home Phone [phone]   Call placed by: Shonna Chock,  August 21, 2008 1:36 PM Call placed to: Patient Summary of Call: SPOKE WITH PATIENT, ALREADY INFORMED OF LAB RESULTS-SCHEDULED APPOINTMENT FOR RECHECK ON 08/28/2008, PATIENT REQUESTED TO HAVE PT CHECKED AT THAT TIME ALSO THROUGH A BLOOD DRAW VS FINGER STICK.  [name]  August 21, 2008 1:37 PM

## 2010-02-17 NOTE — Progress Notes (Signed)
Summary: pt wants to give update on his progress  Phone Note Call from Patient Call back at Home Phone [phone]   Caller: Patient Reason for Call: Talk to Nurse Summary of Call: pt had ablation last week and wants to give you an update on his progress Initial call taken by: Omer Jack,  September 28, 2008 10:29 AM  Follow-up for Phone Call        went out of rhythm the night he was d/c from hospital.  His rate was in the 90's at first but has dropped to the 70's now. But still feels irreg. Will come over to the office for an EKG. Dennis Bast, RN, BSN  September 28, 2008 10:44 AM pt going for a DCCV 09/30/08 at 12pm Dennis Bast, RN, BSN  September 29, 2008 2:18 PM

## 2010-02-17 NOTE — Letter (Signed)
Summary: ELectrophysiology/Ablation Procedure Instructions  [facility], Main Office  [address]   [place], [place] [address]   Phone: [phone]  Fax: [phone]     Electrophysiology/Ablation Procedure Instructions    You are scheduled for a(n) afib ablation on Tuesday, September 7th at 7:30 AM with Dr. Johney Frame  1.  Please come to the Short Stay Center at [facility] at 6AM on the day of your procedure.  2.  Come prepared to stay overnight.   Please bring your insurance cards and a list of your medications.  3.  Come to the [place] office on 09-15-08 for lab work.  The lab at [facility] is open from 8:30 AM to 1:30 PM and 2:30 PM to 5:00 PM.  The lab at [facility] is open from 7:30 AM to 5:30 PM.  You do not have to be fasting.  4.  Do not have anything to eat or drink after midnight the night before your procedure.  5.  Do NOT take these medications the morning of your procedure unless otherwise instructed:  Actos and Glimeperide.  All of your remaining medications may be taken with a small amount of water.     * Occasionally, EP studies and ablations can become lengthy.  Please make your family aware of this before your procedure starts.  Average time ranges from 2-8 hours for EP studies/ablations.  Your physician will locate your family after the procedure with the results.  * If you have any questions after you get home, please call the office at [phone].  Ask for [employer]

## 2010-02-17 NOTE — Assessment & Plan Note (Signed)
Summary: pt check/kdc  Nurse Visit   Vital Signs:  Patient profile:   65 year old male Weight:      218 pounds Temp:     97.9 degrees F oral Pulse rate:   72 / minute Resp:     16 per minute BP sitting:   130 / 80  (left arm)   Impression & Recommendations:  Problem # 1:  PAROXYSMAL ATRIAL FIBRILLATION (ICD-427.31)  His updated medication list for this problem includes:    Atenolol 50 Mg Tabs (Atenolol) .Marland Kitchen... 1 by mouth qd    Coumadin 5 Mg Tabs (Warfarin sodium) .Marland Kitchen... Take as directed    Amiodarone Hcl 400 Mg Tabs (Amiodarone hcl) .Marland Kitchen... Take one tablet by mouth twice a day for 2 weeks, then take one tablet by mouth daily  Orders: Est. Patient Level I (04540) Protime (98119JY)  Problem # 2:  ENCOUNTER FOR THERAPEUTIC DRUG MONITORING (ICD-V58.83)  Orders: Est. Patient Level I (78295) Protime (62130QM)  Complete Medication List: 1)  Actos 45 Mg Tabs (Pioglitazone hcl) .Marland Kitchen.. 1 by mouth qd 2)  Metformin Hcl 1000 Mg Tabs (Metformin hcl) .Marland Kitchen.. 1bid 3)  Tricor 145 Mg Tabs (Fenofibrate) .... Take one tablet daily 4)  Glimepiride 2 Mg Tabs (Glimepiride) .... 1/2 q am 5)  Allergy Shots  6)  Atenolol 50 Mg Tabs (Atenolol) .Marland Kitchen.. 1 by mouth qd 7)  Quinapril Hcl 40 Mg Tabs (Quinapril hcl) .Marland Kitchen.. 1 by mouth qd 8)  Coumadin 5 Mg Tabs (Warfarin sodium) .... Take as directed 9)  Onetouch Ultra Test Strp (Glucose blood) .... Test two times a day 10)  Amiodarone Hcl 400 Mg Tabs (Amiodarone hcl) .... Take one tablet by mouth twice a day for 2 weeks, then take one tablet by mouth daily   Patient Instructions: 1)  No change in dosage; recheck 4 weeks  CC: pt/inr Is Patient Diabetic? No Pain Assessment Patient in pain? no       Have you ever been in a relationship where you felt threatened, hurt or afraid?No   Does patient need assistance? Functional Status Self care Ambulation Normal    Allergies: No Known Drug Allergies  Laboratory Results   Blood Tests     PT: 20.5 s    (Normal Range: 10.6-13.4)  INR: 2.9   (Normal Range: 0.88-1.12   Therap INR: 2.0-3.5)      Orders Added: 1)  Est. Patient Level I [57846] 2)  Protime [96295MW]     Laboratory Results   Blood Tests     PT: 20.5 s   (Normal Range: 10.6-13.4)  INR: 2.9   (Normal Range: 0.88-1.12   Therap INR: 2.0-3.5)      ANTICOAGULATION RECORD PREVIOUS REGIMEN & LAB RESULTS   Previous INR:  4.9 on  07/06/2008 Previous Coumadin Dose(mg):  5MG -M/F, 2.5MG  ALL OTHER DAYS on  07/06/2008 Previous Regimen:  NONE TODAY, THEN 2.5MG  DAILY on  07/06/2008  NEW REGIMEN & LAB RESULTS Current INR: 2.9 Regimen: NONE TODAY, THEN 2.5MG  DAILY  (no change) Coagulation Comments: Patient taking 2.5 mg daily, patient to continue current regimen and return to clinic in one month.  Anticoagulation Visit Questionnaire Coumadin dose missed/changed:  No Abnormal Bleeding Symptoms:  No  Any diet changes including alcohol intake, vegetables or greens since the last visit:  No Any illnesses or hospitalizations since the last visit:  No Any signs of clotting since the last visit (including chest discomfort, dizziness, shortness of breath, arm tingling, slurred speech, swelling or redness in  leg):  No  MEDICATIONS ACTOS 45 MG  TABS (PIOGLITAZONE HCL) 1 by mouth qd METFORMIN HCL 1000 MG  TABS (METFORMIN HCL) 1bid TRICOR 145 MG  TABS (FENOFIBRATE) take one tablet daily GLIMEPIRIDE 2 MG  TABS (GLIMEPIRIDE) 1/2 q am * ALLERGY SHOTS  ATENOLOL 50 MG  TABS (ATENOLOL) 1 by mouth qd QUINAPRIL HCL 40 MG  TABS (QUINAPRIL HCL) 1 by mouth qd COUMADIN 5 MG  TABS (WARFARIN SODIUM) take as directed ONETOUCH ULTRA TEST  STRP (GLUCOSE BLOOD) test two times a day AMIODARONE HCL 400 MG TABS (AMIODARONE HCL) Take one tablet by mouth twice a day for 2 weeks, then take one tablet by mouth daily

## 2010-02-17 NOTE — Progress Notes (Signed)
  Walk in Patient Form Recieved "Questions about Scheduling Procedure" [facility] to [facility]  November 23, 2008 3:01 PM

## 2010-02-17 NOTE — Miscellaneous (Signed)
Summary: chart update  Clinical Lists Changes         Echocardiogram  Procedure date:  08/24/2008  Findings:        Study Conclusions    1. Left ventricle: The cavity size was normal. Wall thickness was      normal. Systolic function was normal.   2. Aortic valve: Trivial regurgitation.   3. Mitral valve: Mild regurgitation.   4. Left atrium: No evidence of thrombus in the appendage. No      evidence of thrombus in the atrial cavity or appendage.   5. Right atrium: No evidence of thrombus in the atrial cavity or      appendage.   6. Atrial septum: No defect or patent foramen ovale was identified.      Echo contrast study showed no right-to-left atrial level shunt,      at baseline or with provocation.   Echocardiography. 2D and color Doppler. Patient status: Outpatient.   Location: Endoscopy.  Left ventricle: The cavity size was normal. Wall thickness was   normal. Systolic function was normal.   Aortic valve: Mildly thickened leaflets. Doppler: Trivial   regurgitation.   Aorta: Very mild fixed plaque in descending aorta.   Mitral valve: Mildly thickened leaflets . Doppler: Mild   regurgitation.   Left atrium: No evidence of thrombus in the appendage. No evidence   of thrombus in the atrial cavity or appendage.   Atrial septum: No defect or patent foramen ovale was identified.   Echo contrast study showed no right-to-left atrial level shunt, at   baseline or with provocation.   Right ventricle: Systolic function was normal.   Tricuspid valve: Structurally normal valve. Leaflet separation was   normal. Doppler: Mild regurgitation.   Right atrium: The atrium was normal in size. No evidence of thrombus   in the atrial cavity or appendage.   Post procedure conclusions   Ascending Aorta:    - Very mild fixed plaque in descending aorta.   Prepared and Electronically Authenticated by    Dietrich Pates, MD

## 2010-02-17 NOTE — Assessment & Plan Note (Signed)
Summary: 6 wk f/u   Visit Type:  Follow-up Referring Provider:  Berton Mount, MD Primary Provider:  [name] MD  CC:  Chest tightness.  History of Present Illness: The patient presents today for routine electrophysiology followup. He reports doing very well since his recent cardioversion. The patient denies symptoms of palpitations, chest pain, shortness of breath, orthopnea, PND, lower extremity edema, dizziness, presyncope, syncope, or neurologic sequela. His SOB and fatigue have improved tremendously.  The patient is tolerating medications without difficulties and is otherwise without complaint today.   Current Medications (verified): 1)  Actos 45 Mg  Tabs (Pioglitazone Hcl) .Marland Kitchen.. 1 By Mouth Qd 2)  Glimepiride 2 Mg  Tabs (Glimepiride) .... 1/2 Q Am 3)  Allergy Shots .... Twice Per Month 4)  Quinapril Hcl 40 Mg  Tabs (Quinapril Hcl) .Marland Kitchen.. 1 By Mouth Qd 5)  Coumadin 5 Mg Tabs (Warfarin Sodium) .... Daw Uad 6)  Onetouch Ultra Test  Strp (Glucose Blood) .... Test Two Times A Day 7)  Multivitamins  Caps (Multiple Vitamin) .... Take 2 Daily 8)  Flecainide Acetate 150 Mg Tabs (Flecainide Acetate) .... 1/2 Tab Two Times A Day 9)  Hydrochlorothiazide 25 Mg Tabs (Hydrochlorothiazide) .... Take One Tablet By Mouth Daily.  Allergies: 1)  ! * No Ivp Dye Allergy 2)  ! * No Latex Allergy 3)  ! * No Shellfish Allergy 4)  ! Clonazepam (Clonazepam)  Past History:  Past Medical History: Reviewed history from 10/26/2008 and no changes required. renal calculus OP 2005 Diabetes mellitus, type II Hyperlipidemia Hypertension Persistent atrial fibrillation s/p ablation 09/22/08  Past Surgical History: Reviewed history from 10/26/2008 and no changes required. THR 7/08  Dr Charlann Boxer; cath 2005 non obstructive CAD Afib ablation 09/22/08  Social History: Reviewed history from 07/31/2008 and no changes required. Pt lives in [place].  He works at [employer].  He previously traveled  frequently with his job, but less recently.  Tob- quit 1976.  ETOH- none,  Drugs- none  Vital Signs:  Patient profile:   65 year old male Height:      69 inches Weight:      222 pounds BMI:     32.90 Pulse rate:   84 / minute BP sitting:   124 / 70  (left arm)  Vitals Entered By: Laurance Flatten CMA (December 21, 2008 11:22 AM)  Physical Exam  General:  in no acute distress; alert,appropriate and cooperative throughout examination Head:  normocephalic and atraumatic Eyes:  PERRLA/EOM intact; conjunctiva and lids normal. Nose:  no deformity, discharge, inflammation, or lesions Mouth:  Teeth, gums and palate normal. Oral mucosa normal. Neck:  Neck supple, no JVD. No masses, thyromegaly or abnormal cervical nodes. Lungs:  Clear bilaterally to auscultation and percussion. Heart:  Non-displaced PMI, chest non-tender; regular rate and rhythm, S1, S2 without murmurs, rubs or gallops. Carotid upstroke normal, no bruit. Normal abdominal aortic size, no bruits. Femorals normal pulses, no bruits. Pedals normal pulses. No edema, no varicosities. Abdomen:  Bowel sounds positive; abdomen soft and non-tender without masses, organomegaly, or hernias noted. No hepatosplenomegaly. Msk:  Back normal, normal gait. Muscle strength and tone normal. Pulses:  pulses normal in all 4 extremities Extremities:  No clubbing or cyanosis. Neurologic:  Alert and oriented x 3.  CNII - XII intact, strength/sensation are intact Skin:  Intact without lesions or rashes. Cervical Nodes:  no significant adenopathy Psych:  Normal affect.   EKG  Procedure date:  12/21/2008  Findings:  sinus rhythm 84 bpm, LAA, PR 186, RBBB (QRS 146), QT 490, otherwise unremarkable  Impression & Recommendations:  Problem # 1:  ATRIAL FIBRILLATION, CHRONIC (ICD-427.31) Doing well s/p recent cardioversion.  Will continue flecainide 75mg  two times a day and coumadin. He is feeling better off atenolol. I have advised that he avoid  ETOH. Will return in 3 months.  The following medications were removed from the medication list:    Amiodarone Hcl 200 Mg Tabs (Amiodarone hcl) .Marland Kitchen... 1 by mouth once daily    Atenolol 50 Mg Tabs (Atenolol) .Marland Kitchen... Take one tablet by mouth daily His updated medication list for this problem includes:    Coumadin 5 Mg Tabs (Warfarin sodium) .Marland Kitchen... Daw uad    Flecainide Acetate 150 Mg Tabs (Flecainide acetate) .Marland Kitchen... 1/2 tab two times a day  Orders: EKG w/ Interpretation (93000)  Problem # 2:  HYPERLIPIDEMIA (ICD-272.4) stable, no changes  Problem # 3:  HYPERTENSION (ICD-401.9) stable, no changes today  The following medications were removed from the medication list:    Atenolol 50 Mg Tabs (Atenolol) .Marland Kitchen... Take one tablet by mouth daily His updated medication list for this problem includes:    Quinapril Hcl 40 Mg Tabs (Quinapril hcl) .Marland Kitchen... 1 by mouth qd    Hydrochlorothiazide 25 Mg Tabs (Hydrochlorothiazide) .Marland Kitchen... Take one tablet by mouth daily.  Patient Instructions: 1)  Your physician recommends that you schedule a follow-up appointment in: 3 months with Dr Johney Frame

## 2010-02-17 NOTE — Progress Notes (Signed)
Summary: needs information asap  Phone Note Call from Patient Call back at [phone]   Caller: Spouse Reason for Call: Talk to Nurse, Talk to Doctor Summary of Call: She will only be at work until noon so she needs the stuff faxed right away. They also need a letter for his insurance stating he will be out of work from 10/17 - 10/26 please fax all info to [phone] and she needs it today because they are going out of town tomorrow and they need it before they leave. Initial call taken by: Omer Jack,  October 15, 2009 8:41 AM  Follow-up for Phone Call        both done and faxed Dennis Bast, RN, BSN  October 15, 2009 8:55 AM

## 2010-02-17 NOTE — Assessment & Plan Note (Signed)
Summary: Cardiology Nuclear Study  Nuclear Med Background Indications for Stress Test: Evaluation for Ischemia, Post Hospital  Indications Comments: 1/4-01/20/09  Post hospital:CP, MI R/O  History: Ablation, Cardioversion, GXT, Heart Catheterization, Myocardial Perfusion Study  History Comments: '02 Cardioversion; '05 Cath:N/O CAD; '07 TFT:DDUKGU, EF=77%;9/10 Ablation; 11/10 RKY:HCWCBJSE   Symptoms: Chest Pain, Chest Tightness, Diaphoresis, DOE, Palpitations, Rapid HR  Symptoms Comments: Last episode of CP:now, 1-2/10.   Nuclear Pre-Procedure Cardiac Risk Factors: History of Smoking, Hypertension, Lipids, NIDDM, Obesity, RBBB Caffeine/Decaff Intake: None NPO After: 9:00 PM Lungs: Clear IV 0.9% NS with Angio Cath: 18g     IV Site: (R) AC IV Started by: Stanton Kidney EMT-P Chest Size (in) 46     Height (in): 68 Weight (lb): 230 BMI: 35.10 Tech Comments: AM meds. taken except for diabetic meds.  Nuclear Med Study 1 or 2 day study:  1 day     Stress Test Type:  Stress Reading MD:  Dietrich Pates, MD     Referring MD:  Berton Mount, MD Resting Radionuclide:  Technetium 29m Tetrofosmin     Resting Radionuclide Dose:  10.8 mCi  Stress Radionuclide:  Technetium 82m Tetrofosmin     Stress Radionuclide Dose:  33.0 mCi   Stress Protocol Exercise Time (min):  7:04 min     Max HR:  148 bpm     Predicted Max HR:  158 bpm  Max Systolic BP: 212 mm Hg     Percent Max HR:  93.67 %     METS: 8.6 Rate Pressure Product:  83151    Stress Test Technologist:  Rea [employer] CMA-N     Nuclear Technologist:  Burna Mortimer Deal RT-N  Rest Procedure  Myocardial perfusion imaging was performed at rest 45 minutes following the intravenous administration of Myoview Technetium 40m Tetrofosmin.  Stress Procedure  The patient exercised for 7:04.  The patient stopped due to fatigue.  He c/o baseline chest tightness, 1-2/10, that increased to 3-4/10 with exercise.  There were no significant ST-T wave changes.   He did have  a hypertensive response to exercise, 212/67.  Myoview was injected at peak exercise and myocardial perfusion imaging was performed after a brief delay.  QPS Raw Data Images:  Images were motion corrected.  Note soft tissue (diaphragm) underlies heart. Stress Images:  There is normal uptake in all areas. Rest Images:  Normal homogeneous uptake in all areas of the myocardium. Subtraction (SDS):  No evidence of ischemia. Transient Ischemic Dilatation:  .97  (Normal <1.22)  Lung/Heart Ratio:  .43  (Normal <0.45)  Quantitative Gated Spect Images QGS EDV:  105 ml QGS ESV:  25 ml QGS EF:  77 %   Overall Impression  Exercise Capacity: Fair exercise capacity. BP Response: Normal blood pressure response. Clinical Symptoms: Mild chest pain/dyspnea.  had chest tightness before starting test. ECG Impression: No significant ST segment change suggestive of ischemia. Overall Impression: Normal stress nuclear study.  Appended Document: Cardiology Nuclear Study Normal myoview  Appended Document: Cardiology Nuclear Study please make sure pt is aware of results. It looks like his flecainide was stopped in the hospital. This should be resumed.  Appended Document: Cardiology Nuclear Study LMTCB.  Appended Document: Cardiology Nuclear Study pt aware and will restart Flecainide 150mg  1/2 tablet two times a day

## 2010-02-17 NOTE — Medication Information (Signed)
Summary: new to coumadin  Anticoagulant Therapy  Managed by: Cloyde Reams, RN, BSN Referring MD: [name], MD PCP: [name] MD Supervising MD: Ladona Ridgel MD, Sharlot Gowda Indication 1: Atrial fibrillation Indication 2: Ablation pending Lab Used: [facility] [place] Site: [address] INR POC 2.5 INR RANGE 2-3  Dietary changes: no    Health status changes: no    Bleeding/hemorrhagic complications: no    Recent/future hospitalizations: no    Any changes in medication regimen? no    Recent/future dental: no  Any missed doses?: no       Is patient compliant with meds? yes      Comments: Ablation scheduled for next Tuesday.    Current Medications (verified): 1)  Actos 45 Mg  Tabs (Pioglitazone Hcl) .Marland Kitchen.. 1 By Mouth Qd 2)  Tricor 145 Mg  Tabs (Fenofibrate) .... Take One Tablet Daily 3)  Glimepiride 2 Mg  Tabs (Glimepiride) .... 1/2 Q Am 4)  Allergy Shots 5)  Atenolol 50 Mg  Tabs (Atenolol) .Marland Kitchen.. 1 By Mouth Qd 6)  Quinapril Hcl 40 Mg  Tabs (Quinapril Hcl) .Marland Kitchen.. 1 By Mouth Qd 7)  Coumadin 1 Mg Tabs (Warfarin Sodium) .... Take 2 Tablets By Mouth Once Daily As Directed 8)  Onetouch Ultra Test  Strp (Glucose Blood) .... Test Two Times A Day 9)  Amiodarone Hcl 400 Mg Tabs (Amiodarone Hcl) .... Take One Tablet By Mouth Twice A Day For 2 Weeks, Then Take One Tablet By Mouth Daily  Allergies (verified): 1)  ! * No Ivp Dye Allergy 2)  ! * No Latex Allergy 3)  ! * No Shellfish Allergy  Anticoagulation Management History:      The patient is taking warfarin and comes in today for a routine follow up visit.  Positive risk factors for bleeding include presence of serious comorbidities.  Negative risk factors for bleeding include an age less than 18 years old and no history of CVA/TIA.  The bleeding index is 'intermediate risk'.  Positive CHADS2 values include History of HTN and History of Diabetes.  Negative CHADS2 values include Age > 12 years old and Prior  Stroke/CVA/TIA.  His last INR was 1.9 ratio.  Anticoagulation responsible provider: Ladona Ridgel MD, Sharlot Gowda.  INR POC: 2.5.  Cuvette Lot#: [id number].  Exp: 10/2009.    Anticoagulation Management Assessment/Plan:      The patient's current anticoagulation dose is Coumadin 1 mg tabs: Take 2 tablets by mouth once daily as directed.  The next INR is due 09/18/2008.  Anticoagulation instructions were given to patient.  Results were reviewed/authorized by Cloyde Reams, RN, BSN.  He was notified by Cloyde Reams RN.         Prior Anticoagulation Instructions: INR 1.9  Take 3 tabs today then 2 tabs daily until 9/3 appt.    Will probably need 1 mg few days / week.    Current Anticoagulation Instructions: INR 2.5  Take 2 mg daily except 1mg  on Mondays and Fridays.

## 2010-02-17 NOTE — Letter (Signed)
Summary: Results Follow-up Letter  [name] at [facility]  [address] [place], [place] [address]   Phone: [phone]  Fax: [phone]    05/11/2008        [name] [address] [place], [place]  [address]  Dear Mr. Njie,   The following are the results of your recent test(s):  Test     Result     Pap Smear    Normal_______  Not Normal_____       Comments: _________________________________________________________ Cholesterol LDL(Bad cholesterol):          Your goal is less than:         HDL (Good cholesterol):        Your goal is more than: _________________________________________________________ Other Tests:   _________________________________________________________  Please call for an appointment Or ____Please see attached labwork._____________________________________________________ _________________________________________________________ _________________________________________________________  Sincerely,  Ardyth Man [place] at [facility]

## 2010-02-17 NOTE — Progress Notes (Signed)
Summary: HOPPER REFILL ON COUMADIN (NO THE GENERIC wARFIN) 5MG  #90  Phone Note Refill Request   Refills Requested: Medication #1:  COUMADIN 5 MG  SOLR take as directed PT WIFE IS CALL FOR COUMADIN 5 MG( NOT THE GENERIC WARFIN) TO BE FAXED IN TO [facility] ORDER [phone] FOR #90 AND COUPLE OF REFILL   Initial call taken by: Freddy Jaksch,  November 19, 2006 3:47 PM  Follow-up for Phone Call        RX FAXED Follow-up by: Doristine Devoid,  November 20, 2006 10:08 AM      Prescriptions: COUMADIN 5 MG  SOLR (WARFARIN SODIUM) take as directed, changes monthly  #90 x 1   Entered by:   Doristine Devoid   Authorized by:   [name] MD   Signed by:   Doristine Devoid on 11/20/2006   Method used:   Printed then faxed to ...         RxID:   [id number]

## 2010-02-17 NOTE — Letter (Signed)
Summary: ELectrophysiology/Ablation Procedure Instructions  [facility], Main Office  [address]   [place], [place] [address]   Phone: [phone]  Fax: [phone]     Electrophysiology/Ablation Procedure Instructions    You are scheduled for a(n) ablation on 08/25/08 at 7:30  with Dr. Johney Frame  1.  Please come to the Short Stay Center at [facility] at 6:00 amon the day of your procedure.  2.  Come prepared to stay overnight.   Please bring your insurance cards and a list of your medications.  3.  Do not have anything to eat or drink after midnight the night before your procedure.  4.  Do NOT take these medications for thedays before your procedure unless otherwise instructed: Glimepride and Metformin.  All of your remaining medications may be taken with a small amount of water.  6.  Educational material received:  __X___ EP   ___X__ Ablation   * Occasionally, EP studies and ablations can become lengthy.  Please make your family aware of this before your procedure starts.  Average time ranges from 2-8 hours for EP studies/ablations.  Your physician will locate your family after the procedure with the results.  * If you have any questions after you get home, please call the office at [phone].   ask for Anselm Pancoast

## 2010-02-17 NOTE — Progress Notes (Signed)
Summary: PT/INR Results  Phone Note Outgoing Call Call back at Home Phone [phone]   Call placed by: Shonna Chock,  December 25, 2008 4:39 PM Call placed to: Patient Details for Reason: PT/INR Results 2.5 Summary of Call: Spoke with patient and informed him his PT was WNL, I will call patient on Monday if Dr.Hopper gives any futher instruction. Otherwise keep medication the same./[facility]  December 25, 2008 4:39 PM

## 2010-02-17 NOTE — Assessment & Plan Note (Signed)
Summary: pt/cbs  Nurse Visit   Vital Signs:  Patient Profile:   65 Years Old Male CC:      PT CHECK , SIGN RX'S Weight:      222.0 pounds Pulse rate:   72 / minute BP sitting:   126 / 70  (left arm) Cuff size:   large  Vitals Entered By: Shonna Chock (December 09, 2007 4:15 PM)                 Prior Medications: ACTOS 45 MG  TABS (PIOGLITAZONE HCL) 1 by mouth qd METFORMIN HCL 1000 MG  TABS (METFORMIN HCL) 1bid TRICOR 145 MG  TABS (FENOFIBRATE) take one tablet daily GLIMEPIRIDE 2 MG  TABS (GLIMEPIRIDE) 1/2 q am ALLERGY SHOTS ()  HYDROCHLOROTHIAZIDE 25 MG  TABS (HYDROCHLOROTHIAZIDE) 1 by mouth qd ATENOLOL 50 MG  TABS (ATENOLOL) 1 by mouth qd FLECAINIDE ACETATE 100 MG  TABS (FLECAINIDE ACETATE) 1 and 1/2 tab qam and 1 tab qpm QUINAPRIL HCL 40 MG  TABS (QUINAPRIL HCL) 1 by mouth qd COUMADIN 5 MG  TABS (WARFARIN SODIUM) take as directed ONETOUCH ULTRA TEST  STRP (GLUCOSE BLOOD) test two times a day Current Allergies: No known allergies  Laboratory Results   Blood Tests      INR: 3.3   (Normal Range: 0.88-1.12   Therap INR: 2.0-3.5)      Orders Added: 1)  Est. Patient Level I [13086] 2)  Protime [57846NG]    Prescriptions: GLIMEPIRIDE 2 MG  TABS (GLIMEPIRIDE) 1/2 q am  #90 x 0   Entered by:   Shonna Chock   Authorized by:   [name] MD   Signed by:   Shonna Chock on 12/09/2007   Method used:   Historical   RxID:   [id number] HYDROCHLOROTHIAZIDE 25 MG  TABS (HYDROCHLOROTHIAZIDE) 1 by mouth qd  #90 x 1   Entered by:   Shonna Chock   Authorized by:   [name] MD   Signed by:   Shonna Chock on 12/09/2007   Method used:   Historical   RxID:   [id number] COUMADIN 5 MG  TABS (WARFARIN SODIUM) take as directed  #120 x 0   Entered by:   Shonna Chock   Authorized by:   [name] MD   Signed by:   Shonna Chock on 12/09/2007   Method used:   Historical   RxID:   [id number]  ]  ANTICOAGULATION RECORD PREVIOUS REGIMEN & LAB  RESULTS   Previous INR:  3.0 on  10/21/2007 Previous Coumadin Dose(mg):  5MG  TAB-5MG  M,W,F AND 2.5MG  ALL OTHER DAYS on  10/21/2007 Previous Regimen:  no change on  10/31/2006  NEW REGIMEN & LAB RESULTS Current INR: 3.3 Current Coumadin Dose(mg): 5MG -M,TUES,WED-5MG . ALL OTHER DAYS 2.5MG  Regimen: 2.5MG  TODAY, 2.5MG  EVERYDAY EXCEPT 5 MG TUES/SAT  Provider: Hadiya Spoerl      Repeat testing in: 4 WEEKS MEDICATIONS ACTOS 45 MG  TABS (PIOGLITAZONE HCL) 1 by mouth qd METFORMIN HCL 1000 MG  TABS (METFORMIN HCL) 1bid TRICOR 145 MG  TABS (FENOFIBRATE) take one tablet daily GLIMEPIRIDE 2 MG  TABS (GLIMEPIRIDE) 1/2 q am * ALLERGY SHOTS  HYDROCHLOROTHIAZIDE 25 MG  TABS (HYDROCHLOROTHIAZIDE) 1 by mouth qd ATENOLOL 50 MG  TABS (ATENOLOL) 1 by mouth qd FLECAINIDE ACETATE 100 MG  TABS (FLECAINIDE ACETATE) 1 and 1/2 tab qam and 1 tab qpm QUINAPRIL HCL 40 MG  TABS (QUINAPRIL HCL) 1 by mouth qd COUMADIN 5 MG  TABS (WARFARIN SODIUM) take as directed [facility]  ULTRA TEST  STRP (GLUCOSE BLOOD) test two times a day   Anticoagulation Visit Questionnaire      Coumadin dose missed/changed:  No      Abnormal Bleeding Symptoms:  No   Any diet changes including alcohol intake, vegetables or greens since the last visit:  No Any illnesses or hospitalizations since the last visit:  No Any signs of clotting since the last visit (including chest discomfort, dizziness, shortness of breath, arm tingling, slurred speech, swelling or redness in leg):  No   Impression & Recommendations:  Problem # 1:  ENCOUNTER FOR THERAPEUTIC DRUG MONITORING (ICD-V58.83)  Orders: Protime ([id number])   Problem # 2:  PAROXYSMAL ATRIAL FIBRILLATION (ICD-427.31)  His updated medication list for this problem includes:    Atenolol 50 Mg Tabs (Atenolol) .Marland Kitchen... 1 by mouth qd    Flecainide Acetate 100 Mg Tabs (Flecainide acetate) .Marland Kitchen... 1 and 1/2 tab qam and 1 tab qpm    Coumadin 5 Mg Tabs (Warfarin sodium) .Marland Kitchen... Take as  directed  Orders: Protime ([id number])   Complete Medication List: 1)  Actos 45 Mg Tabs (Pioglitazone hcl) .Marland Kitchen.. 1 by mouth qd 2)  Metformin Hcl 1000 Mg Tabs (Metformin hcl) .Marland Kitchen.. 1bid 3)  Tricor 145 Mg Tabs (Fenofibrate) .... Take one tablet daily 4)  Glimepiride 2 Mg Tabs (Glimepiride) .... 1/2 q am 5)  Allergy Shots  6)  Hydrochlorothiazide 25 Mg Tabs (Hydrochlorothiazide) .Marland Kitchen.. 1 by mouth qd 7)  Atenolol 50 Mg Tabs (Atenolol) .Marland Kitchen.. 1 by mouth qd 8)  Flecainide Acetate 100 Mg Tabs (Flecainide acetate) .Marland Kitchen.. 1 and 1/2 tab qam and 1 tab qpm 9)  Quinapril Hcl 40 Mg Tabs (Quinapril hcl) .Marland Kitchen.. 1 by mouth qd 10)  Coumadin 5 Mg Tabs (Warfarin sodium) .... Take as directed 11)  Onetouch Ultra Test Strp (Glucose blood) .... Test two times a day   Patient Instructions: 1)  2.5 mg today then 2.5 mg once daily except 5 mg T&Sat; recheck 4 weeks

## 2010-02-17 NOTE — Progress Notes (Signed)
Summary: ? about procedure  Phone Note Call from Patient Call back at [phone]   Caller: Spouse/nancy Reason for Call: Talk to Nurse Summary of Call: wants to speak to nurse about procedure Initial call taken by: Migdalia Dk,  September 08, 2008 2:41 PM  Follow-up for Phone Call        spoke w wife pt needs to have labs drawn on 09/14/08 at Dr Caryl Never office Dennis Bast, RN, BSN  September 08, 2008 3:47 PM

## 2010-02-17 NOTE — Letter (Signed)
Summary: Return To Work  [facility], Main Office  [address]   [place], [place] [address]   Phone: [phone]  Fax: [phone]    11/10/2009  TO: Leodis Sias IT MAY CONCERN   RE: [name] [address] [place]   The above named individual is under my medical care and may return to work on:11/11/09.  If you have any further questions or need additional information, please call.     Sincerely,    Dr.Ryiah Bellissimo, MD Dennis Bast, RN, BSN

## 2010-02-17 NOTE — Progress Notes (Signed)
Summary: pt in afib/** Wcpb.nm  Phone Note Call from Patient   Caller: Patient Reason for Call: Talk to Nurse Summary of Call: pt states he is in afib. No SOB or nausea. [phone] Initial call taken by: Edman Circle,  June 24, 2009 9:42 AM  Follow-up for Phone Call        Spoke with pt. Patient states he has been on A-fib since monday 06/21/09. Pt denies SOB , nausea or pain. Pt. states he feels fine. Pt.  will fax the office an EKG today. We call him  back after MD reads it. Ollen Gross, RN, BSN  June 24, 2009 11:11 AM  EKG faxed from pt.  EKG was read by Dr. Johney Frame. MD recommeds  because pt. denies on any symptoms and is taken Flecainide 150 mg 1/2 tablet twice a day. Pt. can eather wait to see if he convert to SR and call the office monday. Or he can be scheduled for  cardioversion for tomorrow Friday. Pt. states he will wait till monday. He will call to speak with Tresa Endo Dr. Jenel Lucks nurse. Ollen Gross, RN, BSN  June 24, 2009 1:16 PM  Follow-up by: Hillis Range, MD,  June 24, 2009 2:44 PM  Additional Follow-up for Phone Call Additional follow up Details #1::        I called pts house and left message.  We will see if he converts to sinus.  If not, he will need cardioversion early next week. Additional Follow-up by: Hillis Range, MD,  June 24, 2009 2:44 PM    Additional Follow-up for Phone Call Additional follow up Details #2::    pt scheduled for DCCV on 07/01/09 at 1:00pm with DR Eden Emms.  Will need to get there 2 hours prior for an H&P.  Pt aware Dennis Bast, RN, BSN  June 28, 2009 11:06 AM

## 2010-02-17 NOTE — Progress Notes (Signed)
Summary: c/o rapid heart rate ; afib  Phone Note Call from Patient Call back at Home Phone [phone] Call back at Work Phone [phone]   Caller: Patient Reason for Call: Talk to Nurse Summary of Call: per pt calling, c/o rapid heart beat. afib. no chestpain, nor sob.  Initial call taken by: Lorne Skeens,  August 06, 2009 11:32 AM  Follow-up for Phone Call        spoke with pt he is going to look and his schedule and call back and schedule follow up appoinment with Dr Johney Frame in Aug.  If he goes back into afib will have an EKG done on Mon and faxed to me Dennis Bast, RN, BSN  August 06, 2009 12:50 PM     Appended Document: c/o rapid heart rate ; afib EKG done ...shows a fib with controlled rate. Called patient and advised will talk with Dr.Salomon Ganser on 7/26 to see if he wants to see him in the office or set up another cardioversion.

## 2010-02-17 NOTE — Progress Notes (Signed)
Summary: heart is out of rhythm, pls call pt  Medications Added FLECAINIDE ACETATE 100 MG TABS (FLECAINIDE ACETATE)  FUROSEMIDE 20 MG TABS (FUROSEMIDE)  FLECAINIDE ACETATE 100 MG TABS (FLECAINIDE ACETATE)  FUROSEMIDE 20 MG TABS (FUROSEMIDE)        Phone Note Call from Patient   Caller: Spouse Reason for Call: Talk to Nurse Summary of Call:  pt's heart is out of rhytm .Marland KitchenMarland KitchenMarland KitchenMarland Kitchen pt also has a stress test tom... pls call wife cell [phone] Initial call taken by: Sydell Axon,  May 19, 2008 8:40 AM  Follow-up for Phone Call        cardioversion 05/15/08-now back in atrial fibrillation-scheduled for myoview 05/05-should he keep appt for myoview? should his med be adjusted?-pt asymtomatic will discuss with Dr [name] and follow-up with patient today Katina Dung, RN, BSN  May 19, 2008 9:04 AM   Additional Follow-up for Phone Call Additional follow up Details #1::        We should reschedule the DCCV as the goal is to test him in sinus rhtyhm.  As he has failed rhythmol 325, we have two choices;  the first is to try a higher dose of rythmol, ie 425 or to think about either tikosyn (requiring in hospital)  or dronaderone.  I think he and I discussed these options last week.  The second is to have him see Dr Johney Frame for RFCA of AF although this will take him a few weeks to see and do etc. Let me know if i should call him Tnaks steve Additional Follow-up by: Nathen May, MD, [facility],  May 19, 2008 11:57 AM    New/Updated Medications: FLECAINIDE ACETATE 100 MG TABS (FLECAINIDE ACETATE)  FUROSEMIDE 20 MG TABS (FUROSEMIDE)  FLECAINIDE ACETATE 100 MG TABS (FLECAINIDE ACETATE)  FUROSEMIDE 20 MG TABS (FUROSEMIDE)     Current Allergies: No known allergies  Appended Document: heart is out of rhythm, pls call pt    Phone Note Outgoing Call   Summary of Call: I talked with patient's wife. We discussed treatment options Dr [name] mentioned in his phone note from today. Wife would like Dr  [name] to talk with patient at [phone]. Will forward this to Dr [name] to follow-up with patient. Katina Dung, RN, BSN  May 19, 2008 2:56 PM  I cancelled Myoview appt for 05/20/08 per Dr Tracie Harrier, RN, BSN  May 19, 2008 3:05 PM

## 2010-02-17 NOTE — Progress Notes (Signed)
Summary: PT/INR Results  Phone Note Outgoing Call Call back at Work Phone [phone]   Call placed by: Shonna Chock,  April 27, 2009 4:48 PM Call placed to: Patient Details for Reason: PT/INR Results Summary of Call: Left message on VM informing patient to keep meds the same and recheck in 3 weeks. Level was 2.6./[facility]  April 27, 2009 4:49 PM

## 2010-02-17 NOTE — Progress Notes (Signed)
Summary: PT/INR Results  Phone Note Outgoing Call Call back at Work Phone [phone]   Call placed by: Shonna Chock CMA,  October 04, 2009 5:03 PM Call placed to: Patient Details for Reason: PT/INR 3.6 Summary of Call: Left message on machine for patient to return call when avaliable and  per Dr.Dyllin Gulley have patient to HOLD blood thinner tonight and call tomorrow with his current dose, ? if he changed diet or missed and doses.  Marland KitchenShonna Chock CMA  October 04, 2009 5:04 PM   Follow-up for Phone Call        Spoke with patient 5mg  M/W/F, 1/2 all other days. No missed doses or change in diet   **Patient with pending appointment next week for PT Check due to pending surgery date 11/02/09**  Dr.Demetric Parslow please advise on Coumadin dose  Follow-up by: Shonna Chock CMA,  October 05, 2009 8:31 AM  Additional Follow-up for Phone Call Additional follow up Details #1::        Change to 5 mg Mon & Fri; 1/2 all other days..Does the Coumadin Clinic feel he'll need to be off Coumadin & on Lovenox for this surgery? If so, the Clinic will need to arrange the Lovenox coverage. Additional Follow-up by: [name] MD,  October 05, 2009 8:50 AM    Additional Follow-up for Phone Call Additional follow up Details #2::    Left message with Dr.Evaline Waltman's instruction and left the number for the patient to contact the coumadin clinic Follow-up by: Shonna Chock CMA,  October 05, 2009 9:31 AM  Additional Follow-up for Phone Call Additional follow up Details #3:: Details for Additional Follow-up Action Taken: I called patient to verify he received message that I left earlier on his voicemail:  Patient said he spoke with Tresa Endo over at cardiology and he was instructed to take coumadin and monitor closely and thats why he is schedule for a PT/INR weekly until his surgery which is a non-cutting procedure. Forwarded to Dr.Zayanna Pundt as a Desma Mcgregor CMA  October 05, 2009 4:35 PM

## 2010-02-17 NOTE — Miscellaneous (Signed)
Summary: Cardiac CT  Clinical Lists Changes  Orders: Added new Referral order of Cardiac CTA (Cardiac CTA) - Signed 

## 2010-02-17 NOTE — Progress Notes (Signed)
Summary: letter to okay surgery  CHART TO YOU  Phone Note Call from Patient   Caller: Spouse Reason for Call: Talk to Nurse Summary of Call: dr. [name] [phone] would like to have a letter to okay him with sugery on 7.22.08. Initial call taken by: Charolette Child,  July 25, 2006 1:15 PM  Follow-up for Phone Call        please pull chart; computer doesn't show recent OV. ?last evaluation by Dr [name] 12/07? Follow-up by: [name] MD,  July 25, 2006 1:58 PM  Additional Follow-up for Phone Call Additional follow up Details #1::        CHART TO YOU Additional Follow-up by: Kandice Hams,  July 25, 2006 6:05 PM    Additional Follow-up for Phone Call Additional follow up Details #2::    needs OV if surgical clearance required Follow-up by: [name] MD,  July 30, 2006 6:16 PM

## 2010-02-17 NOTE — Progress Notes (Signed)
Summary: braand name coumadin - dr hopper  Phone Note Call from Patient Call back at [phone]   Caller: Spouse Summary of Call: rx that was faxed for coumadin [id number] was the generic - patient cant take generic which was sent to him he now has rx that he cant use - he needs a new rx for brand name only fax to [phone] Initial call taken by: Okey Regal [place],  November 27, 2006 10:54 AM  Follow-up for Phone Call        Rx Called In was faxed for the brand name Follow-up by: Wendall Stade,  November 28, 2006 12:34 PM      Prescriptions: COUMADIN 5 MG  SOLR (WARFARIN SODIUM) take as directed, changes monthly  #90 x 1   Entered by:   Wendall Stade   Authorized by:   [name] MD   Signed by:   Wendall Stade on 11/28/2006   Method used:   Printed then faxed to ...         RxID:   [id number]  patient is on name brand coumadin and can not be switched per the coumadin clinic

## 2010-02-21 ENCOUNTER — Telehealth: Payer: Self-pay | Admitting: Internal Medicine

## 2010-02-23 ENCOUNTER — Telehealth: Payer: Self-pay | Admitting: Internal Medicine

## 2010-03-02 NOTE — Op Note (Signed)
  NAMEBRYANT, SAYE NO.:  [id number]  MEDICAL RECORD NO.:  [id number]          PATIENT TYPE:  OIB  LOCATION:  2899                         FACILITY:  [facility]  PHYSICIAN:  Hillis Range, MD       DATE OF BIRTH:  1945-05-19  DATE OF PROCEDURE: DATE OF DISCHARGE:  02/09/2010                              OPERATIVE REPORT   SURGEON:  Hillis Range, MD  PREPROCEDURE DIAGNOSIS:  Persistent atrial fibrillation.  POSTPROCEDURE DIAGNOSES:  Persistent atrial fibrillation.  PROCEDURE:  Cardioversion.  INTRODUCTION:  [name] is a pleasant 65 year old gentleman with a history of persistent atrial fibrillation.  He is status post atrial fibrillation ablation with subsequent repeat procedure due to recurrence of atrial fibrillation.  He has failed medical therapy with multiple antiarrhythmic medicines.  Most recently, he has been placed on amiodarone.  As he has now completed a healing phase of 3 months following his recent ablation, he presents today for repeat cardioversion.  DESCRIPTION OF THE PROCEDURE:  Informed written consent was obtained, and the patient was brought to the short-stay area in the fasting state. Adequate IV access and airway support were assured by anesthesia.  The patient's INRs were confirmed to be therapeutic for more than 4 weeks consecutively.  He presented in atrial fibrillation.  Cardioversion electrodes were placed in the anterior-posterior thoracic configuration. Initial 200-joule biphasic shock was delivered which did not cardiovert the patient to sinus rhythm.  A second 200-joule shock was therefore applied, which successfully cardioverted the patient to sinus rhythm. He remained in sinus rhythm thereafter.  There were no early apparent complications.  CONCLUSIONS: 1. Successful cardioversion of persistent atrial fibrillation. 2. No early apparent complications.     Hillis Range, MD     JA/MEDQ  D:  02/09/2010  T:   02/09/2010  Job:  [id number]  Electronically Signed by Hillis Range MD on 03/02/2010 10:20:52 PM

## 2010-03-03 NOTE — Progress Notes (Signed)
Summary: RE PROCEDURE  Phone Note Call from Patient Call back at Work Phone [phone]   Reason for Call: Talk to Nurse Summary of Call: pt calling re Cardioversion. pt only wants to talk to kelly. Initial call taken by: Roe Coombs,  February 22, 2010 8:25 AM  Follow-up for Phone Call        lmom for pt to call me back tomorrow Dennis Bast, RN, BSN  February 22, 2010 5:37 PM spoke with pt --he is out of rhythm again  and has been out for over a week Will have him come in for office visit he likes [facility] Questions if he can stop Amiodarone and go back to Flecainide?   Will discuss with Dr Johney Frame and move his apt up for f/u Dennis Bast, RN, BSN  February 23, 2010 11:13 AM  Additional Follow-up for Phone Call Additional follow up Details #1::        Stop Amiodarone at this point and no need for Flecainide.  Pt aware and aware Efraim Kaufmann will be calling to move apt up Dennis Bast, RN, BSN  February 23, 2010 4:45 PM

## 2010-03-03 NOTE — Progress Notes (Signed)
Summary: Labs--HgA1c  Phone Note Call from Patient   Summary of Call: I spoke with the patient b/c he was under the impression the he needed his A1c checked. I reviewed labs from 12/2009 and made patient aware that MD specified to repeat in 4 months. Patient expressed understanding and will call back at the time.   HgA1c dx 250.00  Initial call taken by: Lucious Groves CMA,  February 23, 2010 11:35 AM

## 2010-03-09 ENCOUNTER — Ambulatory Visit (INDEPENDENT_AMBULATORY_CARE_PROVIDER_SITE_OTHER): Payer: Managed Care, Other (non HMO)

## 2010-03-09 ENCOUNTER — Telehealth (INDEPENDENT_AMBULATORY_CARE_PROVIDER_SITE_OTHER): Payer: Self-pay | Admitting: *Deleted

## 2010-03-09 ENCOUNTER — Encounter (INDEPENDENT_AMBULATORY_CARE_PROVIDER_SITE_OTHER): Payer: Self-pay | Admitting: *Deleted

## 2010-03-09 DIAGNOSIS — Z7901 Long term (current) use of anticoagulants: Secondary | ICD-10-CM

## 2010-03-09 DIAGNOSIS — I4891 Unspecified atrial fibrillation: Secondary | ICD-10-CM

## 2010-03-09 DIAGNOSIS — Z5181 Encounter for therapeutic drug level monitoring: Secondary | ICD-10-CM

## 2010-03-09 LAB — CONVERTED CEMR LAB: INR: 5

## 2010-03-10 ENCOUNTER — Encounter: Payer: Self-pay | Admitting: Internal Medicine

## 2010-03-15 ENCOUNTER — Ambulatory Visit: Payer: Self-pay | Admitting: Cardiology

## 2010-03-15 NOTE — Medication Information (Signed)
Summary: [facility] New Prescription   [facility] New Prescription   Imported By: Roderic Ovens 03/09/2010 12:22:29  _____________________________________________________________________  External Attachment:    Type:   Image     Comment:   External Document

## 2010-03-15 NOTE — Medication Information (Signed)
Summary: pt check///sph  Anticoagulant Therapy  Managed by: Inactive Referring MD: Berton Mount, MD PCP: [name] MD Supervising MD: Johney Frame MD, Fayrene Fearing Indication 1: Atrial fibrillation Indication 2: Ablation pending Lab Used: [facility] [place] Site: [address] INR RANGE 2-3  Vital Signs: Weight: 232 lbs.  Pulse Rate: 82  Blood Pressure:  118 / 82            Allergies: 1)  ! * No Ivp Dye Allergy 2)  ! * No Latex Allergy 3)  ! * No Shellfish Allergy  Vital Signs:  Patient profile:   65 year old male Height:      68 inches Weight:      232 pounds Temp:     98.1 degrees F oral Pulse rate:   82 / minute BP sitting:   118 / 82  (left arm)  Vitals Entered By: Jeremy Johann CMA (March 09, 2010 4:41 PM)  CC: PT/INR    Patient Instructions: 1)  Please schedule a follow-up appointment in 1 weeks. 2)  Hold today then 1/2 tab tomorrow then resume regular schedule of 1 tab M,W,F and 1/2 tab all other days      Anticoagulation Management History:      Positive risk factors for bleeding include presence of serious comorbidities.  Negative risk factors for bleeding include an age less than 37 years old and no history of CVA/TIA.  The bleeding index is 'intermediate risk'.  Positive CHADS2 values include History of HTN and History of Diabetes.  Negative CHADS2 values include Age > 40 years old and Prior Stroke/CVA/TIA.  His last INR was 2.6 ratio and today's INR is 5.0.  Anticoagulation responsible provider: Allred MD, Fayrene Fearing.  Exp: 10/2009.    Anticoagulation Management Assessment/Plan:      The patient's current anticoagulation dose is Coumadin 5 mg tabs: DAW UAD.  The next INR is due 1 WEEK.  Anticoagulation instructions were given to patient.  Results were reviewed/authorized by Inactive.         Prior Anticoagulation Instructions: INR 3.2   Continue current dose.  Tresa Endo, RN to call with instructions   Laboratory Results   Blood Tests       INR: 5.0   (Normal Range: 0.88-1.12   Therap INR: 2.0-3.5)      ANTICOAGULATION RECORD PREVIOUS REGIMEN & LAB RESULTS Anticoagulation Diagnosis:  Atrial fibrillation on  09/04/2008 Previous INR Goal Range:  2-3 on  09/15/2008 Previous INR:  2.6 ratio on  12/29/2009 Previous Coumadin Dose(mg):  2.5mg  daily on  12/16/2009 Previous Regimen:  5MG  TODAY, THEN 5MG  ON WED, 2.5MG  ALL OTHER DAYS on  11/13/2008 Previous Coagulation Comments:  Patient taking 2.5 mg daily, patient to continue current regimen and return to clinic in one month. on  07/13/2008  NEW REGIMEN & LAB RESULTS Current INR: 5.0 Current Coumadin Dose(mg): 1 tab M,W,F and 1/2 tab all other days    Regimen: Hold today then 1/2 tab tomorrow then resume regular schedule of 1 tab M,W,F and 1/2 tab all other days     Provider: HOPPER Repeat testing in: 1 WEEK  Anticoagulation Visit Questionnaire Coumadin dose missed/changed:  No Abnormal Bleeding Symptoms:  No  Any diet changes including alcohol intake, vegetables or greens since the last visit:  No Any illnesses or hospitalizations since the last visit:  No Any signs of clotting since the last visit (including chest discomfort, dizziness, shortness of breath, arm tingling, slurred speech, swelling or redness in leg):  No  MEDICATIONS GLIMEPIRIDE 2 MG  TABS (GLIMEPIRIDE) 1/2 q am **LABS DUE 04/2010** * ALLERGY SHOTS twice per month QUINAPRIL HCL 40 MG  TABS (QUINAPRIL HCL) 1 by mouth qd COUMADIN 5 MG TABS (WARFARIN SODIUM) DAW UAD [BMN] ONETOUCH ULTRA TEST  STRP (GLUCOSE BLOOD) Check bloodsugar 1 x daily MULTIVITAMINS  CAPS (MULTIPLE VITAMIN) Take 2 daily HYDROCHLOROTHIAZIDE 25 MG TABS (HYDROCHLOROTHIAZIDE) Take 1/2 tablet by mouth daily. ATENOLOL 50 MG TABS (ATENOLOL) 1 by mouth once daily AMIODARONE HCL 200 MG TABS (AMIODARONE HCL) Take one tablet by mouth twice a day XOPENEX HFA 45 MCG/ACT AERO (LEVALBUTEROL TARTRATE) 1 inhalation every every 6 hrs as  needed DULERA 200-5 MCG/ACT AERO (MOMETASONE FURO-FORMOTEROL FUM) 1-2 puffs every 12 hrs as needed ; gargle & spit after use TRADJENTA 5 MG TABS (LINAGLIPTIN) take one daily   Appended Document: pt check///sph

## 2010-03-15 NOTE — Progress Notes (Signed)
Summary: refill  Phone Note Refill Request Message from:  Patient  Refills Requested: Medication #1:  QUINAPRIL HCL 40 MG  TABS 1 by mouth qd [facility] [phone]........Marland KitchenFelecia Deloach CMA  March 09, 2010 4:48 PM      Prescriptions: QUINAPRIL HCL 40 MG  TABS (QUINAPRIL HCL) 1 by mouth qd  #90 x 2   Entered by:   Shonna Chock CMA   Authorized by:   [name] MD   Signed by:   Shonna Chock CMA on 03/09/2010   Method used:   Faxed to ...       [facility] (mail-order)             , [place]         Ph: [phone]       Fax: [phone]   RxID:   [id number]

## 2010-03-16 ENCOUNTER — Encounter: Payer: Self-pay | Admitting: Internal Medicine

## 2010-03-22 ENCOUNTER — Encounter: Payer: Self-pay | Admitting: Internal Medicine

## 2010-03-23 ENCOUNTER — Encounter: Payer: Self-pay | Admitting: Internal Medicine

## 2010-03-23 ENCOUNTER — Other Ambulatory Visit: Payer: Self-pay | Admitting: Internal Medicine

## 2010-03-23 ENCOUNTER — Encounter (INDEPENDENT_AMBULATORY_CARE_PROVIDER_SITE_OTHER): Payer: Managed Care, Other (non HMO) | Admitting: Internal Medicine

## 2010-03-23 DIAGNOSIS — I4891 Unspecified atrial fibrillation: Secondary | ICD-10-CM

## 2010-03-23 LAB — PROTIME-INR
INR: 1.3 ratio — ABNORMAL HIGH (ref 0.8–1.0)
Prothrombin Time: 14.7 s — ABNORMAL HIGH (ref 10.2–12.4)

## 2010-03-24 ENCOUNTER — Telehealth: Payer: Self-pay | Admitting: Internal Medicine

## 2010-03-24 ENCOUNTER — Telehealth (INDEPENDENT_AMBULATORY_CARE_PROVIDER_SITE_OTHER): Payer: Self-pay | Admitting: *Deleted

## 2010-03-28 LAB — GLUCOSE, CAPILLARY: Glucose-Capillary: 106 mg/dL — ABNORMAL HIGH (ref 70–99)

## 2010-03-28 LAB — PROTIME-INR
INR: 2.9 — ABNORMAL HIGH (ref 0.00–1.49)
Prothrombin Time: 30.4 seconds — ABNORMAL HIGH (ref 11.6–15.2)

## 2010-03-29 LAB — GLUCOSE, CAPILLARY: Glucose-Capillary: 115 mg/dL — ABNORMAL HIGH (ref 70–99)

## 2010-03-29 LAB — PROTIME-INR
INR: 2.57 — ABNORMAL HIGH (ref 0.00–1.49)
Prothrombin Time: 27.7 seconds — ABNORMAL HIGH (ref 11.6–15.2)

## 2010-03-29 NOTE — Assessment & Plan Note (Signed)
Summary: eph per dr Adelyna Brockman call/lg rs per pt call=mj moved pt sooner p...   Visit Type:  eph Referring Provider:  Berton Mount, MD Primary Provider:  [name] MD   History of Present Illness: The patient presents today for routine electrophysiology followup.  Unfortunately, he continues to have recurrent symptomatic afib despite two ablation procedures and medical therapy with amiodarone.  He reports maintaining sinus rhythm for only two days after his recent cardioversion.  He reports symptoms of fatigue and decreased exercise tolerance with afib. The patient denies symptoms of chest pain, orthopnea, PND, dizziness, presyncope, syncope, or neurologic sequela.  The patient is tolerating medications without difficulties and is otherwise without complaint today.   Current Medications (verified): 1)  Glimepiride 2 Mg  Tabs (Glimepiride) .... 1/2 Q Am **labs Due 04/2010** 2)  Allergy Shots .... Twice Per Month 3)  Quinapril Hcl 40 Mg  Tabs (Quinapril Hcl) .Marland Kitchen.. 1 By Mouth Qd 4)  Coumadin 5 Mg Tabs (Warfarin Sodium) .... Daw Uad 5)  Onetouch Ultra Test  Strp (Glucose Blood) .... Check Bloodsugar 1 X Daily 6)  Multivitamins  Caps (Multiple Vitamin) .... Take 2 Daily 7)  Hydrochlorothiazide 25 Mg Tabs (Hydrochlorothiazide) .... Take 1/2 Tablet By Mouth Daily. 8)  Atenolol 50 Mg Tabs (Atenolol) .Marland Kitchen.. 1 By Mouth Once Daily 9)  Tradjenta 5 Mg Tabs (Linagliptin) .... Take One Daily  Allergies (verified): 1)  ! * No Ivp Dye Allergy 2)  ! * No Latex Allergy 3)  ! * No Shellfish Allergy  Past History:  Past Medical History: Reviewed history from 11/25/2009 and no changes required. renal calculus OP 2005 Diabetes mellitus, type II Hyperlipidemia Hypertension Persistent atrial fibrillation s/p ablation 09/22/08 and 11/03/09 Cardioversion-2011  Past Surgical History: Reviewed history from 11/25/2009 and no changes required. THR 7/08  Dr Charlann Boxer; cath 2005 non obstructive CAD Afib ablation 09/22/08  and 11/03/09  Family History: Reviewed history from 09/06/2007 and no changes required. Father: DM,CVA Mother: lung CA Siblings: bro testicular CA no FH premature CAD  Social History: Reviewed history from 07/31/2008 and no changes required. Pt lives in [place].  He works at [employer].  He previously traveled frequently with his job, but less recently.  Tob- quit 1976.  ETOH- none,  Drugs- none  Review of Systems       All systems are reviewed and negative except as listed in the HPI.   Vital Signs:  Patient profile:   65 year old male Height:      68 inches Weight:      234.25 pounds BMI:     35.75 Pulse rate:   92 / minute BP sitting:   154 / 90  (left arm)  Vitals Entered By: Caralee Ates CMA (March 23, 2010 12:20 PM)  Physical Exam  General:  well-nourished,in no acute distress; alert,appropriate and cooperative throughout examination Head:  normocephalic and atraumatic Eyes:  PERRLA/EOM intact; conjunctiva and lids normal. Mouth:  Teeth, gums and palate normal. Oral mucosa normal. Neck:  supple Lungs:  Clear bilaterally to auscultation and percussion. Heart:  tachy irregular rhythm, no m/r/g Abdomen:  Bowel sounds positive; abdomen soft and non-tender without masses, organomegaly, or hernias noted. No hepatosplenomegaly. Msk:  Back normal, normal gait. Muscle strength and tone normal. Extremities:  No clubbing or cyanosis. Neurologic:  Alert and oriented x 3. Skin:  Intact without lesions or rashes. Psych:  Normal affect.   Nuclear Study  Procedure date:  01/21/2009  Findings:  Overall Impression   Exercise Capacity: Fair exercise capacity. BP Response: Normal blood pressure response. Clinical Symptoms: Mild chest pain/dyspnea.  had chest tightness before starting test. ECG Impression: No significant ST segment change suggestive of ischemia. Overall Impression: Normal stress nuclear study.   Cardiac CT  Procedure date:   10/27/2009  Findings:        Findings:  There was sever LAE.  Parasternal 3 chamber diameter was   50mm.  There was mild RAE.  Qualitative LV EF was normal at 60%.   RV was normal.  There was no ASD or VSD.  There was no pericardial   effusion.  Ther coronaries arose normally from the appropriate   cusps.  There was no significant stenosis in the proximal vessels.   The aortic root was normal.    The patient had 5 major PV's including a RMPV:: AP diameters were   as follows:    LUPV: 1.7 cm    LLPV 1.6 cm    RUPV: 1.9 cm    RMPV: 1.3 cm    RLPV: 1.9 cm    There was no evidence of pulmonary vein stenosis and no LAA   thrombus.    Impression:       1)    Normal PV anatomy including RMPV.  See above for diameters.         No pulmonary vein stenosis    2)    Severe LAE 3 chamber diameter 50mm   3)    Mild RAE   4)    Qualitatively normal EF 60%   5)    No LAA thrombus    Read By:  Wendall Stade,  M.D.  TE Echocardiogram  Procedure date:  11/01/2009  Findings:        Study Conclusions    - Left atrium: No evidence of thrombus in the atrial cavity or     appendage.   - Atrial septum: No defect or patent foramen ovale was identified.     With injection of agitated saline there were few smaller bubbles     late consistent with small intrapulmonary shunt.   Transesophageal echocardiography. 2D and color Doppler. Height:   Height: 175.3cm. Height: 69in. Weight: Weight: 104.5kg. Weight:   230lb. Body mass index: BMI: 34kg/m^2. Body surface area: BSA:   2.69m^2. Blood pressure: 133/75. Patient status: Outpatient.   Location: Endoscopy.     EKG  Procedure date:  03/23/2010  Findings:      atrial fibrillation, V rate 84 bpm, RBBB  Impression & Recommendations:  Problem # 1:  ATRIAL FIBRILLATION (ICD-427.31) heart rate above goal on exam, though ekg reveals averate heart rate is in the 80s.  He has failed rhythm control with flecainide, tikosyn, and most  recently amiodarone.  He has had afib ablation x 2.  His LA is severely enlarged.  We had a long discussion with the patient regarding his treatement options.  At this point, I would not recommend repeat PVI.  I think that the next logical step would be to consider either surgical MAZE procedure or Covergent procedure.  I have offered referral to either [facility] or [facility] for consideration of these options.  He has family in [place] and is leaning toward referral to [facility].  We will therefore make arrangements to have records forwarded.  I will continue to follow him during this period as we consider further options.  He will continue coumadin (goal INR 2-3).  We will increase atenolol  today for improved rate control.  Amiodarone has been discontinued.  Problem # 2:  HYPERTENSION (ICD-401.9) above goal increase atenolol to 100mg  daily.  Problem # 3:     Other Orders: TLB-PT (Protime) (85610-PTP)  Patient Instructions: 1)  Your physician recommends that you schedule a follow-up appointment in: 3 months with Dr Johney Frame 2)  Your physician has recommended you make the following change in your medication: increase Atenolol to 100mg   Prescriptions: ATENOLOL 100 MG TABS (ATENOLOL) one by mouth daily  #90 x 3   Entered by:   Dennis Bast, RN, BSN   Authorized by:   Hillis Range, MD   Signed by:   Dennis Bast, RN, BSN on 03/23/2010   Method used:   Electronically to        [facility]* (retail)             ,          Ph: [phone]       Fax: [phone]   RxID:   [id number] ATENOLOL 100 MG TABS (ATENOLOL) one by mouth daily  #30 x 11   Entered by:   Dennis Bast, RN, BSN   Authorized by:   Hillis Range, MD   Signed by:   Dennis Bast, RN, BSN on 03/23/2010   Method used:   Electronically to        [facility] [address] [id number]* (retail)       [address]       [place], [place]  [address]       Ph: [phone]       Fax: [phone]   RxID:    [id number]

## 2010-03-29 NOTE — Progress Notes (Signed)
----   Converted from flag ---- ---- 03/24/2010 5:41 AM, [name] MD wrote: [name], thank you for keeping me informed as to your status.I strongly support your decision to go to [facility]. It is a superb institution , but more importantly , you'll  have family support there. [place]opp ------------------------------  Note mailed to patient .Shonna Chock CMA  March 24, 2010 4:51 PM

## 2010-03-29 NOTE — Progress Notes (Signed)
Summary: pt's wife needs name of dr at [facility] Note Call from Patient   Caller: Spouse nancy [phone] Reason for Call: Talk to Nurse Summary of Call: needs name of dr at [facility] dr Isiaah Cuervo was to refer pt to Initial call taken by: Glynda Jaeger,  March 24, 2010 10:04 AM  Follow-up for Phone Call        Dr Sindy Messing, RN, BSN  March 24, 2010 10:41 AM [facility] is not in there net work But the doctor may be she is going to check Dennis Bast, RN, BSN  March 24, 2010 10:43 AM

## 2010-03-30 LAB — GLUCOSE, CAPILLARY
Glucose-Capillary: 111 mg/dL — ABNORMAL HIGH (ref 70–99)
Glucose-Capillary: 120 mg/dL — ABNORMAL HIGH (ref 70–99)
Glucose-Capillary: 125 mg/dL — ABNORMAL HIGH (ref 70–99)
Glucose-Capillary: 127 mg/dL — ABNORMAL HIGH (ref 70–99)
Glucose-Capillary: 137 mg/dL — ABNORMAL HIGH (ref 70–99)
Glucose-Capillary: 157 mg/dL — ABNORMAL HIGH (ref 70–99)
Glucose-Capillary: 190 mg/dL — ABNORMAL HIGH (ref 70–99)
Glucose-Capillary: 219 mg/dL — ABNORMAL HIGH (ref 70–99)

## 2010-03-30 LAB — CBC
HCT: 39.7 % (ref 39.0–52.0)
Hemoglobin: 13.2 g/dL (ref 13.0–17.0)
MCH: 30.2 pg (ref 26.0–34.0)
MCHC: 33.2 g/dL (ref 30.0–36.0)
MCV: 90.8 fL (ref 78.0–100.0)
Platelets: 161 10*3/uL (ref 150–400)
RBC: 4.37 MIL/uL (ref 4.22–5.81)
RDW: 14.7 % (ref 11.5–15.5)
WBC: 7.4 10*3/uL (ref 4.0–10.5)

## 2010-03-30 LAB — BASIC METABOLIC PANEL
BUN: 22 mg/dL (ref 6–23)
CO2: 27 mEq/L (ref 19–32)
Calcium: 8.6 mg/dL (ref 8.4–10.5)
Chloride: 102 mEq/L (ref 96–112)
Creatinine, Ser: 1.19 mg/dL (ref 0.4–1.5)
GFR calc Af Amer: >60 mL/min (ref >60)
GFR calc non Af Amer: >60 mL/min (ref >60)
Glucose, Bld: 118 mg/dL — ABNORMAL HIGH (ref 70–99)
Potassium: 4.2 mEq/L (ref 3.5–5.1)
Sodium: 137 mEq/L (ref 135–145)

## 2010-03-30 LAB — PROTIME-INR
INR: 1.89 — ABNORMAL HIGH (ref 0.00–1.49)
INR: 2.1 — ABNORMAL HIGH (ref 0.00–1.49)
Prothrombin Time: 21.9 seconds — ABNORMAL HIGH (ref 11.6–15.2)
Prothrombin Time: 23.7 seconds — ABNORMAL HIGH (ref 11.6–15.2)

## 2010-03-30 LAB — MRSA PCR SCREENING: MRSA by PCR: POSITIVE — AB

## 2010-03-31 ENCOUNTER — Encounter: Payer: Self-pay | Admitting: Internal Medicine

## 2010-04-01 ENCOUNTER — Other Ambulatory Visit: Payer: Self-pay | Admitting: Internal Medicine

## 2010-04-01 ENCOUNTER — Ambulatory Visit (INDEPENDENT_AMBULATORY_CARE_PROVIDER_SITE_OTHER): Payer: Managed Care, Other (non HMO) | Admitting: Internal Medicine

## 2010-04-01 ENCOUNTER — Encounter: Payer: Self-pay | Admitting: Internal Medicine

## 2010-04-01 DIAGNOSIS — I4891 Unspecified atrial fibrillation: Secondary | ICD-10-CM

## 2010-04-01 DIAGNOSIS — E119 Type 2 diabetes mellitus without complications: Secondary | ICD-10-CM

## 2010-04-01 DIAGNOSIS — K5900 Constipation, unspecified: Secondary | ICD-10-CM | POA: Insufficient documentation

## 2010-04-01 DIAGNOSIS — Z7901 Long term (current) use of anticoagulants: Secondary | ICD-10-CM

## 2010-04-01 DIAGNOSIS — Z5181 Encounter for therapeutic drug level monitoring: Secondary | ICD-10-CM

## 2010-04-01 LAB — POTASSIUM: Potassium: 5.1 mEq/L (ref 3.5–5.1)

## 2010-04-01 LAB — CONVERTED CEMR LAB: INR: 1.7

## 2010-04-01 LAB — HEMOGLOBIN A1C: Hgb A1c MFr Bld: 6.5 % (ref 4.6–6.5)

## 2010-04-01 LAB — BUN: BUN: 25 mg/dL — ABNORMAL HIGH (ref 6–23)

## 2010-04-01 LAB — MICROALBUMIN / CREATININE URINE RATIO
Creatinine,U: 156.8 mg/dL
Microalb Creat Ratio: 2 mg/g (ref 0.0–30.0)
Microalb, Ur: 3.2 mg/dL — ABNORMAL HIGH (ref 0.0–1.9)

## 2010-04-01 LAB — CREATININE, SERUM: Creatinine, Ser: 1.2 mg/dL (ref 0.4–1.5)

## 2010-04-01 LAB — TSH: TSH: 6.85 u[IU]/mL — ABNORMAL HIGH (ref 0.35–5.50)

## 2010-04-02 LAB — GLUCOSE, CAPILLARY
Glucose-Capillary: 108 mg/dL — ABNORMAL HIGH (ref 70–99)
Glucose-Capillary: 109 mg/dL — ABNORMAL HIGH (ref 70–99)
Glucose-Capillary: 114 mg/dL — ABNORMAL HIGH (ref 70–99)
Glucose-Capillary: 131 mg/dL — ABNORMAL HIGH (ref 70–99)
Glucose-Capillary: 82 mg/dL (ref 70–99)
Glucose-Capillary: 98 mg/dL (ref 70–99)
Glucose-Capillary: 99 mg/dL (ref 70–99)

## 2010-04-02 LAB — CARDIAC PANEL(CRET KIN+CKTOT+MB+TROPI)
CK, MB: 1.1 ng/mL (ref 0.3–4.0)
CK, MB: 1.1 ng/mL (ref 0.3–4.0)
Relative Index: INVALID (ref 0.0–2.5)
Relative Index: INVALID (ref 0.0–2.5)
Total CK: 70 U/L (ref 7–232)
Total CK: 91 U/L (ref 7–232)
Troponin I: 0.01 ng/mL (ref 0.00–0.06)
Troponin I: 0.03 ng/mL (ref 0.00–0.06)

## 2010-04-02 LAB — BASIC METABOLIC PANEL
BUN: 30 mg/dL — ABNORMAL HIGH (ref 6–23)
CO2: 27 mEq/L (ref 19–32)
Calcium: 9.4 mg/dL (ref 8.4–10.5)
Chloride: 100 mEq/L (ref 96–112)
Creatinine, Ser: 1.17 mg/dL (ref 0.4–1.5)
GFR calc Af Amer: >60 mL/min (ref >60)
GFR calc non Af Amer: >60 mL/min (ref >60)
Glucose, Bld: 103 mg/dL — ABNORMAL HIGH (ref 70–99)
Potassium: 3.9 mEq/L (ref 3.5–5.1)
Sodium: 139 mEq/L (ref 135–145)

## 2010-04-02 LAB — HEPARIN LEVEL (UNFRACTIONATED)
Heparin Unfractionated: 0.27 IU/mL — ABNORMAL LOW (ref 0.30–0.70)
Heparin Unfractionated: 0.39 IU/mL (ref 0.30–0.70)
Heparin Unfractionated: 0.46 IU/mL (ref 0.30–0.70)
Heparin Unfractionated: 0.53 IU/mL (ref 0.30–0.70)

## 2010-04-02 LAB — CBC
HCT: 38 % — ABNORMAL LOW (ref 39.0–52.0)
HCT: 43.3 % (ref 39.0–52.0)
Hemoglobin: 12.9 g/dL — ABNORMAL LOW (ref 13.0–17.0)
Hemoglobin: 14.9 g/dL (ref 13.0–17.0)
MCHC: 34 g/dL (ref 30.0–36.0)
MCHC: 34.4 g/dL (ref 30.0–36.0)
MCV: 88.4 fL (ref 78.0–100.0)
MCV: 89.5 fL (ref 78.0–100.0)
Platelets: 169 10*3/uL (ref 150–400)
Platelets: 208 10*3/uL (ref 150–400)
RBC: 4.25 MIL/uL (ref 4.22–5.81)
RBC: 4.9 MIL/uL (ref 4.22–5.81)
RDW: 15.6 % — ABNORMAL HIGH (ref 11.5–15.5)
RDW: 16.1 % — ABNORMAL HIGH (ref 11.5–15.5)
WBC: 5.7 10*3/uL (ref 4.0–10.5)
WBC: 8.5 10*3/uL (ref 4.0–10.5)

## 2010-04-02 LAB — DIFFERENTIAL
Basophils Absolute: 0.1 10*3/uL (ref 0.0–0.1)
Basophils Relative: 1 % (ref 0–1)
Eosinophils Absolute: 0.6 10*3/uL (ref 0.0–0.7)
Eosinophils Relative: 7 % — ABNORMAL HIGH (ref 0–5)
Lymphocytes Relative: 15 % (ref 12–46)
Lymphs Abs: 1.3 10*3/uL (ref 0.7–4.0)
Monocytes Absolute: 1.1 10*3/uL — ABNORMAL HIGH (ref 0.1–1.0)
Monocytes Relative: 13 % — ABNORMAL HIGH (ref 3–12)
Neutro Abs: 5.5 10*3/uL (ref 1.7–7.7)
Neutrophils Relative %: 65 % (ref 43–77)

## 2010-04-02 LAB — POCT CARDIAC MARKERS
CKMB, poc: 1.2 ng/mL (ref 1.0–8.0)
Myoglobin, poc: 56.6 ng/mL (ref 12–200)
Troponin i, poc: <0.05 ng/mL (ref 0.00–0.09)

## 2010-04-02 LAB — APTT: aPTT: 31 seconds (ref 24–37)

## 2010-04-02 LAB — CK TOTAL AND CKMB (NOT AT ARMC)
CK, MB: 1.4 ng/mL (ref 0.3–4.0)
Relative Index: INVALID (ref 0.0–2.5)
Total CK: 79 U/L (ref 7–232)

## 2010-04-02 LAB — TROPONIN I: Troponin I: 0.01 ng/mL (ref 0.00–0.06)

## 2010-04-02 LAB — PROTIME-INR
INR: 1.21 (ref 0.00–1.49)
Prothrombin Time: 15.2 seconds (ref 11.6–15.2)

## 2010-04-03 LAB — BASIC METABOLIC PANEL
BUN: 23 mg/dL (ref 6–23)
CO2: 30 mEq/L (ref 19–32)
Calcium: 9.8 mg/dL (ref 8.4–10.5)
Chloride: 100 mEq/L (ref 96–112)
Creatinine, Ser: 1.18 mg/dL (ref 0.4–1.5)
GFR calc Af Amer: >60 mL/min (ref >60)
GFR calc non Af Amer: >60 mL/min (ref >60)
Glucose, Bld: 105 mg/dL — ABNORMAL HIGH (ref 70–99)
Potassium: 3.9 mEq/L (ref 3.5–5.1)
Sodium: 138 mEq/L (ref 135–145)

## 2010-04-03 LAB — CBC
HCT: 44.8 % (ref 39.0–52.0)
Hemoglobin: 15.3 g/dL (ref 13.0–17.0)
MCHC: 34.3 g/dL (ref 30.0–36.0)
MCV: 91.6 fL (ref 78.0–100.0)
Platelets: 177 10*3/uL (ref 150–400)
RBC: 4.89 MIL/uL (ref 4.22–5.81)
RDW: 15.2 % (ref 11.5–15.5)
WBC: 6.3 10*3/uL (ref 4.0–10.5)

## 2010-04-03 LAB — APTT: aPTT: 45 seconds — ABNORMAL HIGH (ref 24–37)

## 2010-04-03 LAB — MAGNESIUM: Magnesium: 2.1 mg/dL (ref 1.5–2.5)

## 2010-04-03 LAB — PROTIME-INR
INR: 2.7 — ABNORMAL HIGH (ref 0.00–1.49)
Prothrombin Time: 28.5 seconds — ABNORMAL HIGH (ref 11.6–15.2)

## 2010-04-05 NOTE — Assessment & Plan Note (Signed)
Summary: Follow-up and PT check/CM   Vital Signs:  Patient profile:   65 year old male Weight:      232 pounds BMI:     35.40 Temp:     98.2 degrees F oral Pulse rate:   72 / minute Resp:     14 per minute BP sitting:   132 / 88  (left arm) Cuff size:   large  Vitals Entered By: Shonna Chock CMA (April 01, 2010 8:13 AM) CC: Follow-up visit: PT check and discuss recent medical history, Type 2 diabetes mellitus follow-up   Primary Care Provider:  [name] MD  CC:  Follow-up visit: PT check and discuss recent medical history and Type 2 diabetes mellitus follow-up.  History of Present Illness:    His PT/INR is 1.7 on 2.5 mg once daily for 10 days;  PT/INR was 5.3 on 5 mg M,W , & F and 2.5 mg other days. He had been off Amiodarone 1 week. He is on only Atenolol as "blocker".    Type 2 Diabetes Mellitus Follow-Up; Kathlene November  denies polyuria, polydipsia, blurred vision, self managed hypoglycemia, weight loss or  gain, numbness of extremities, neuropathic pain, chest pain, vomiting, orthostatic symptoms, poor wound healing, intermittent claudication, vision loss, and foot ulcer.  Since the last visit the patient reports good dietary compliance and not exercising regularly.  The patient has been measuring capillary blood glucose before breakfast, 110 average.  Since the last visit, the patient reports having had no eye care  ( overdue) and foot care by Dr Charlsie Merles ,Podiatrist ( tendon strain was treated with walking boot).  A1c was 6.7% with microalbumin 3.3 on 01/05/2010. He questions whether Jearld Lesch is causing  problematic constipation since 12/11 .  Current Medications (verified): 1)  Glimepiride 2 Mg  Tabs (Glimepiride) .... 1/2 Q Am **labs Due 04/2010** 2)  Allergy Shots .... Twice Per Month 3)  Quinapril Hcl 40 Mg  Tabs (Quinapril Hcl) .Marland Kitchen.. 1 By Mouth Qd 4)  Coumadin 5 Mg Tabs (Warfarin Sodium) .... Daw Uad 5)  Onetouch Ultra Test  Strp (Glucose Blood) .... Check Bloodsugar 1 X Daily 6)   Multivitamins  Caps (Multiple Vitamin) .... Take 2 Daily 7)  Hydrochlorothiazide 25 Mg Tabs (Hydrochlorothiazide) .... Take 1/2 Tablet By Mouth Daily. 8)  Atenolol 100 Mg Tabs (Atenolol) .... One By Mouth Daily 9)  Tradjenta 5 Mg Tabs (Linagliptin) .... Take One Daily  Allergies: 1)  ! * No Ivp Dye Allergy  Physical Exam  General:  well-nourished;alert,appropriate and cooperative throughout examination Lungs:  Normal respiratory effort, chest expands symmetrically. Lungs are clear to auscultation, no crackles or wheezes. Heart:  normal rate, no murmur, no gallop, no rub, no JVD, and irregular rhythm.   Abdomen:  Bowel sounds positive,abdomen soft and non-tender without masses, organomegaly or hernias noted. Pulses:  R and L carotid,radial,dorsalis pedis and posterior tibial pulses are full and equal bilaterally Extremities:  No clubbing, cyanosis, edema. good nail health Neurologic:  alert & oriented X3 and sensation intact to light touch over feet.  DTRs 1/2 + Skin:  Intact without suspicious lesions or rashes. tanned Psych:  memory intact for recent and remote, normally interactive, and good eye contact.     Impression & Recommendations:  Problem # 1:  ATRIAL FIBRILLATION (ICD-427.31) rate controlled His updated medication list for this problem includes:    Coumadin 5 Mg Tabs (Warfarin sodium) .Marland Kitchen... Daw uad    Atenolol 100 Mg Tabs (Atenolol) ..... One by mouth daily  Orders: Protime (16109UE) Venipuncture (45409) TLB-TSH (Thyroid Stimulating Hormone) (84443-TSH)  Problem # 2:  ENCOUNTER FOR THERAPEUTIC DRUG MONITORING (ICD-V58.83)  Orders: Protime (81191YN)  Problem # 3:  DIABETES MELLITUS, TYPE II (ICD-250.00)  ? control; FBS excellent His updated medication list for this problem includes:    Glimepiride 2 Mg Tabs (Glimepiride) .Marland Kitchen... 1/2 q am **labs due 04/2010**    Quinapril Hcl 40 Mg Tabs (Quinapril hcl) .Marland Kitchen... 1 by mouth qd    Tradjenta 5 Mg Tabs (Linagliptin) .Marland Kitchen...  Take one daily  Orders: Venipuncture (82956) TLB-A1C / Hgb A1C (Glycohemoglobin) (83036-A1C) TLB-Microalbumin/Creat Ratio, Urine (82043-MALB) TLB-BUN (Urea Nitrogen) (84520-BUN) TLB-Potassium (K+) (84132-K) TLB-Creatinine, Blood (82565-CREA)  Problem # 4:  CONSTIPATION (ICD-564.00)  Orders: Venipuncture (21308) TLB-TSH (Thyroid Stimulating Hormone) (84443-TSH)  Complete Medication List: 1)  Glimepiride 2 Mg Tabs (Glimepiride) .... 1/2 q am **labs due 04/2010** 2)  Allergy Shots  .... Twice per month 3)  Quinapril Hcl 40 Mg Tabs (Quinapril hcl) .Marland Kitchen.. 1 by mouth qd 4)  Coumadin 5 Mg Tabs (Warfarin sodium) .... Daw uad 5)  Onetouch Ultra Test Strp (Glucose blood) .... Check bloodsugar 1 x daily 6)  Multivitamins Caps (Multiple vitamin) .... Take 2 daily 7)  Hydrochlorothiazide 25 Mg Tabs (Hydrochlorothiazide) .... Take 1/2 tablet by mouth daily. 8)  Atenolol 100 Mg Tabs (Atenolol) .... One by mouth daily 9)  Tradjenta 5 Mg Tabs (Linagliptin) .... Take one daily  Patient Instructions: 1)  Check your blood sugars regularly. If your readings are usually above :150  or below 90 you should contact our office. 2)  See your eye doctor yearly to check for diabetic eye damage. 3)  Check your feet each night for sore areas, calluses or signs of infection.Colace or PeriColace once daily & drink to thirst ,up to 40 oz of water / day.Miralax every 3rd day as needed . Coumadin  5 mg M,W, & F ; 2.5 mg all other days.PT/INR in 1 week   Orders Added: 1)  Protime [85610QW] 2)  Est. Patient Level IV [65784] 3)  Venipuncture [69629] 4)  TLB-TSH (Thyroid Stimulating Hormone) [84443-TSH] 5)  TLB-A1C / Hgb A1C (Glycohemoglobin) [83036-A1C] 6)  TLB-Microalbumin/Creat Ratio, Urine [82043-MALB] 7)  TLB-BUN (Urea Nitrogen) [84520-BUN] 8)  TLB-Potassium (K+) [84132-K] 9)  TLB-Creatinine, Blood [82565-CREA]     ANTICOAGULATION RECORD PREVIOUS REGIMEN & LAB RESULTS Anticoagulation Diagnosis:  Atrial  fibrillation on  09/04/2008 Previous INR Goal Range:  2-3 on  09/15/2008 Previous INR:  1.3 ratio on  03/23/2010 Previous Coumadin Dose(mg):  1 tab M,W,F and 1/2 tab all other days    on  03/09/2010 Previous Regimen:  Hold today then 1/2 tab tomorrow then resume regular schedule of 1 tab M,W,F and 1/2 tab all other days    on  03/09/2010 Previous Coagulation Comments:  Patient taking 2.5 mg daily, patient to continue current regimen and return to clinic in one month. on  07/13/2008  NEW REGIMEN & LAB RESULTS Current INR: 1.7 Current Coumadin Dose(mg): 2.5mg  daily  Regimen: Hold today then 1/2 tab tomorrow then resume regular schedule of 1 tab M,W,F and 1/2 tab all other days     (no change)  Provider: Emya Picado MEDICATIONS GLIMEPIRIDE 2 MG  TABS (GLIMEPIRIDE) 1/2 q am **LABS DUE 04/2010** * ALLERGY SHOTS twice per month QUINAPRIL HCL 40 MG  TABS (QUINAPRIL HCL) 1 by mouth qd COUMADIN 5 MG TABS (WARFARIN SODIUM) DAW UAD [BMN] ONETOUCH ULTRA TEST  STRP (GLUCOSE BLOOD) Check bloodsugar 1 x daily MULTIVITAMINS  CAPS (MULTIPLE VITAMIN) Take 2 daily HYDROCHLOROTHIAZIDE 25 MG TABS (HYDROCHLOROTHIAZIDE) Take 1/2 tablet by mouth daily. ATENOLOL 100 MG TABS (ATENOLOL) one by mouth daily TRADJENTA 5 MG TABS (LINAGLIPTIN) take one daily     Appended Document: Follow-up and PT check/CM

## 2010-04-05 NOTE — Letter (Signed)
Summary: [facility] Appointment Schedule   Imported By: Marylou Mccoy 03/24/2010 11:58:11  _____________________________________________________________________  External Attachment:    Type:   Image     Comment:   External Document

## 2010-04-05 NOTE — Progress Notes (Signed)
Summary: PT/INR Results  Phone Note Outgoing Call Call back at [facility] Phone [phone] Call back at Work Phone [phone]   Call placed by: Shonna Chock CMA,  March 24, 2010 5:05 PM Call placed to: Patient Details for Reason: PT/INR 1.3 Summary of Call: Spoke with patient's wife informed patient was at work, try cell phone [phone].  I called patient on cell and left message for him:  per Dr.Hopper to call our office 1st thing in the am with his current dosage of blood thinner. Patient is to double pill tonight (Per Dr.Hopper) and call in the am  Shonna Chock CMA  March 24, 2010 5:06 PM   Follow-up for Phone Call        Left message on patient's voicemail (cell phone and home phone) informing patient  to call with current coumadin dose.  Follow-up by: Shonna Chock CMA,  March 25, 2010 11:36 AM  Additional Follow-up for Phone Call Additional follow up Details #1::        pt called back---will call back after 1:00.Jerolyn Shin  March 25, 2010 12:05 PM    Additional Follow-up for Phone Call Additional follow up Details #2::    Left message on machine for patient to return call when avaliable, Reason for call:  discuss blood thinner dose./Chrae [facility] CMA  March 28, 2010 3:54 PM   Patient called back and indicated that he was taking 1/4 of 5mg  tab. Patient received info on 03/24/2010 and doubled med that day and since then taking 2.5mg  daily.  Per Dr.Hopper continue 2.5mg  and check level on friday./Chrae [facility] CMA  March 28, 2010 4:44 PM  Follow-up by: Shonna Chock CMA,  March 28, 2010 4:40 PM

## 2010-04-11 ENCOUNTER — Ambulatory Visit (INDEPENDENT_AMBULATORY_CARE_PROVIDER_SITE_OTHER): Payer: Managed Care, Other (non HMO) | Admitting: *Deleted

## 2010-04-11 DIAGNOSIS — Z5181 Encounter for therapeutic drug level monitoring: Secondary | ICD-10-CM

## 2010-04-11 DIAGNOSIS — Z7901 Long term (current) use of anticoagulants: Secondary | ICD-10-CM

## 2010-04-11 LAB — POCT INR: INR: 3.2

## 2010-04-12 NOTE — Patient Instructions (Signed)
Pt aware of dose and instructions.

## 2010-04-18 ENCOUNTER — Telehealth: Payer: Self-pay | Admitting: Internal Medicine

## 2010-04-18 DIAGNOSIS — I4891 Unspecified atrial fibrillation: Secondary | ICD-10-CM

## 2010-04-18 NOTE — Telephone Encounter (Signed)
Pt needs to be ref to a sleep dr. Rock Nephew would like to talk to a nurse.

## 2010-04-18 NOTE — Telephone Encounter (Signed)
Saw Dr Macon Large Caleen Essex and he wants him to see MD for sleep apnea  Who would you recommend Spoke with Dr Johney Frame and he recommend Dr Marcelyn Bruins

## 2010-04-20 LAB — COMPREHENSIVE METABOLIC PANEL
ALT: 48 U/L (ref 0–53)
AST: 40 U/L — ABNORMAL HIGH (ref 0–37)
Albumin: 3.8 g/dL (ref 3.5–5.2)
Alkaline Phosphatase: 51 U/L (ref 39–117)
BUN: 27 mg/dL — ABNORMAL HIGH (ref 6–23)
CO2: 28 mEq/L (ref 19–32)
Calcium: 9.6 mg/dL (ref 8.4–10.5)
Chloride: 109 mEq/L (ref 96–112)
Creatinine, Ser: 1.24 mg/dL (ref 0.4–1.5)
GFR calc Af Amer: >60 mL/min (ref >60)
GFR calc non Af Amer: 59 mL/min — ABNORMAL LOW (ref >60)
Glucose, Bld: 99 mg/dL (ref 70–99)
Potassium: 4.9 mEq/L (ref 3.5–5.1)
Sodium: 143 mEq/L (ref 135–145)
Total Bilirubin: 0.7 mg/dL (ref 0.3–1.2)
Total Protein: 7 g/dL (ref 6.0–8.3)

## 2010-04-20 LAB — BASIC METABOLIC PANEL
BUN: 27 mg/dL — ABNORMAL HIGH (ref 6–23)
CO2: 23 mEq/L (ref 19–32)
Calcium: 9.4 mg/dL (ref 8.4–10.5)
Chloride: 108 mEq/L (ref 96–112)
Creatinine, Ser: 1.17 mg/dL (ref 0.4–1.5)
GFR calc Af Amer: >60 mL/min (ref >60)
GFR calc non Af Amer: >60 mL/min (ref >60)
Glucose, Bld: 100 mg/dL — ABNORMAL HIGH (ref 70–99)
Potassium: 5.6 mEq/L — ABNORMAL HIGH (ref 3.5–5.1)
Sodium: 140 mEq/L (ref 135–145)

## 2010-04-20 LAB — CBC
HCT: 42.1 % (ref 39.0–52.0)
Hemoglobin: 14.2 g/dL (ref 13.0–17.0)
MCHC: 33.7 g/dL (ref 30.0–36.0)
MCV: 90.1 fL (ref 78.0–100.0)
Platelets: 173 10*3/uL (ref 150–400)
RBC: 4.68 MIL/uL (ref 4.22–5.81)
RDW: 16 % — ABNORMAL HIGH (ref 11.5–15.5)
WBC: 6.9 10*3/uL (ref 4.0–10.5)

## 2010-04-20 LAB — PROTIME-INR
INR: 2.39 — ABNORMAL HIGH (ref 0.00–1.49)
INR: 2.46 — ABNORMAL HIGH (ref 0.00–1.49)
Prothrombin Time: 25.9 seconds — ABNORMAL HIGH (ref 11.6–15.2)
Prothrombin Time: 26.5 seconds — ABNORMAL HIGH (ref 11.6–15.2)

## 2010-04-20 LAB — DIFFERENTIAL
Basophils Absolute: 0 10*3/uL (ref 0.0–0.1)
Basophils Relative: 0 % (ref 0–1)
Eosinophils Absolute: 0.4 10*3/uL (ref 0.0–0.7)
Eosinophils Relative: 6 % — ABNORMAL HIGH (ref 0–5)
Lymphocytes Relative: 15 % (ref 12–46)
Lymphs Abs: 1 10*3/uL (ref 0.7–4.0)
Monocytes Absolute: 0.6 10*3/uL (ref 0.1–1.0)
Monocytes Relative: 8 % (ref 3–12)
Neutro Abs: 4.9 10*3/uL (ref 1.7–7.7)
Neutrophils Relative %: 71 % (ref 43–77)

## 2010-04-20 LAB — APTT
aPTT: 42 seconds — ABNORMAL HIGH (ref 24–37)
aPTT: 44 seconds — ABNORMAL HIGH (ref 24–37)

## 2010-04-21 ENCOUNTER — Telehealth: Payer: Self-pay | Admitting: Internal Medicine

## 2010-04-21 NOTE — Telephone Encounter (Signed)
Will have [name] in MR fax to Dr Gale Journey at the [facility] today

## 2010-04-21 NOTE — Telephone Encounter (Signed)
Pt's wife calling re fax to [facility],was told fax sent last week and she called today and they don't have it-pls call

## 2010-04-21 NOTE — Telephone Encounter (Signed)
Records faxed to [facility] 04/21/10/KM

## 2010-04-22 LAB — BASIC METABOLIC PANEL
BUN: 23 mg/dL (ref 6–23)
BUN: 32 mg/dL — ABNORMAL HIGH (ref 6–23)
CO2: 25 mEq/L (ref 19–32)
CO2: 27 mEq/L (ref 19–32)
Calcium: 9 mg/dL (ref 8.4–10.5)
Calcium: 9.3 mg/dL (ref 8.4–10.5)
Chloride: 105 mEq/L (ref 96–112)
Chloride: 111 mEq/L (ref 96–112)
Creatinine, Ser: 1.46 mg/dL (ref 0.4–1.5)
Creatinine, Ser: 1.55 mg/dL — ABNORMAL HIGH (ref 0.4–1.5)
GFR calc Af Amer: 55 mL/min — ABNORMAL LOW (ref >60)
GFR calc Af Amer: 59 mL/min — ABNORMAL LOW (ref >60)
GFR calc non Af Amer: 46 mL/min — ABNORMAL LOW (ref >60)
GFR calc non Af Amer: 49 mL/min — ABNORMAL LOW (ref >60)
Glucose, Bld: 128 mg/dL — ABNORMAL HIGH (ref 70–99)
Glucose, Bld: 92 mg/dL (ref 70–99)
Potassium: 4 mEq/L (ref 3.5–5.1)
Potassium: 4.7 mEq/L (ref 3.5–5.1)
Sodium: 138 mEq/L (ref 135–145)
Sodium: 142 mEq/L (ref 135–145)

## 2010-04-22 LAB — CBC
HCT: 33.7 % — ABNORMAL LOW (ref 39.0–52.0)
Hemoglobin: 11.4 g/dL — ABNORMAL LOW (ref 13.0–17.0)
MCHC: 33.8 g/dL (ref 30.0–36.0)
MCV: 90 fL (ref 78.0–100.0)
Platelets: 231 10*3/uL (ref 150–400)
RBC: 3.74 MIL/uL — ABNORMAL LOW (ref 4.22–5.81)
RDW: 17 % — ABNORMAL HIGH (ref 11.5–15.5)
WBC: 5.4 10*3/uL (ref 4.0–10.5)

## 2010-04-22 LAB — GLUCOSE, CAPILLARY
Glucose-Capillary: 114 mg/dL — ABNORMAL HIGH (ref 70–99)
Glucose-Capillary: 117 mg/dL — ABNORMAL HIGH (ref 70–99)
Glucose-Capillary: 138 mg/dL — ABNORMAL HIGH (ref 70–99)
Glucose-Capillary: 57 mg/dL — ABNORMAL LOW (ref 70–99)
Glucose-Capillary: 62 mg/dL — ABNORMAL LOW (ref 70–99)
Glucose-Capillary: 71 mg/dL (ref 70–99)
Glucose-Capillary: 76 mg/dL (ref 70–99)
Glucose-Capillary: 78 mg/dL (ref 70–99)
Glucose-Capillary: 83 mg/dL (ref 70–99)

## 2010-04-22 LAB — MAGNESIUM: Magnesium: 2.4 mg/dL (ref 1.5–2.5)

## 2010-04-22 LAB — PROTIME-INR
INR: 2.2 — ABNORMAL HIGH (ref 0.00–1.49)
INR: 2.6 — ABNORMAL HIGH (ref 0.00–1.49)
INR: 3 — ABNORMAL HIGH (ref 0.00–1.49)
Prothrombin Time: 24.2 seconds — ABNORMAL HIGH (ref 11.6–15.2)
Prothrombin Time: 27.2 seconds — ABNORMAL HIGH (ref 11.6–15.2)
Prothrombin Time: 30.9 seconds — ABNORMAL HIGH (ref 11.6–15.2)

## 2010-04-22 LAB — APTT: aPTT: 49 seconds — ABNORMAL HIGH (ref 24–37)

## 2010-04-23 LAB — GLUCOSE, CAPILLARY: Glucose-Capillary: 85 mg/dL (ref 70–99)

## 2010-04-23 LAB — PROTIME-INR
INR: 3.2 — ABNORMAL HIGH (ref 0.00–1.49)
INR: 3.5 — ABNORMAL HIGH (ref 0.00–1.49)
Prothrombin Time: 32.8 seconds — ABNORMAL HIGH (ref 11.6–15.2)
Prothrombin Time: 35.1 seconds — ABNORMAL HIGH (ref 11.6–15.2)

## 2010-04-23 LAB — APTT: aPTT: 45 seconds — ABNORMAL HIGH (ref 24–37)

## 2010-04-24 LAB — CBC
HCT: 38.1 % — ABNORMAL LOW (ref 39.0–52.0)
Hemoglobin: 12.8 g/dL — ABNORMAL LOW (ref 13.0–17.0)
MCHC: 33.5 g/dL (ref 30.0–36.0)
MCV: 90.5 fL (ref 78.0–100.0)
Platelets: 218 10*3/uL (ref 150–400)
RBC: 4.21 MIL/uL — ABNORMAL LOW (ref 4.22–5.81)
RDW: 15.6 % — ABNORMAL HIGH (ref 11.5–15.5)
WBC: 5 10*3/uL (ref 4.0–10.5)

## 2010-04-24 LAB — BASIC METABOLIC PANEL
BUN: 24 mg/dL — ABNORMAL HIGH (ref 6–23)
CO2: 24 mEq/L (ref 19–32)
Calcium: 9.1 mg/dL (ref 8.4–10.5)
Chloride: 111 mEq/L (ref 96–112)
Creatinine, Ser: 1.21 mg/dL (ref 0.4–1.5)
GFR calc Af Amer: >60 mL/min (ref >60)
GFR calc non Af Amer: >60 mL/min (ref >60)
Glucose, Bld: 95 mg/dL (ref 70–99)
Potassium: 4.3 mEq/L (ref 3.5–5.1)
Sodium: 142 mEq/L (ref 135–145)

## 2010-04-24 LAB — PROTIME-INR
INR: 3.6 — ABNORMAL HIGH (ref 0.00–1.49)
Prothrombin Time: 39.3 seconds — ABNORMAL HIGH (ref 11.6–15.2)

## 2010-04-26 LAB — GLUCOSE, CAPILLARY
Glucose-Capillary: 105 mg/dL — ABNORMAL HIGH (ref 70–99)
Glucose-Capillary: 153 mg/dL — ABNORMAL HIGH (ref 70–99)
Glucose-Capillary: 75 mg/dL (ref 70–99)
Glucose-Capillary: 77 mg/dL (ref 70–99)
Glucose-Capillary: 78 mg/dL (ref 70–99)
Glucose-Capillary: 90 mg/dL (ref 70–99)
Glucose-Capillary: 92 mg/dL (ref 70–99)
Glucose-Capillary: 93 mg/dL (ref 70–99)

## 2010-04-26 LAB — BASIC METABOLIC PANEL
BUN: 29 mg/dL — ABNORMAL HIGH (ref 6–23)
BUN: 34 mg/dL — ABNORMAL HIGH (ref 6–23)
BUN: 34 mg/dL — ABNORMAL HIGH (ref 6–23)
CO2: 25 mEq/L (ref 19–32)
CO2: 26 mEq/L (ref 19–32)
CO2: 27 mEq/L (ref 19–32)
Calcium: 10 mg/dL (ref 8.4–10.5)
Calcium: 9.4 mg/dL (ref 8.4–10.5)
Calcium: 9.5 mg/dL (ref 8.4–10.5)
Chloride: 104 mEq/L (ref 96–112)
Chloride: 110 mEq/L (ref 96–112)
Chloride: 110 mEq/L (ref 96–112)
Creatinine, Ser: 1.21 mg/dL (ref 0.4–1.5)
Creatinine, Ser: 1.26 mg/dL (ref 0.4–1.5)
Creatinine, Ser: 1.42 mg/dL (ref 0.4–1.5)
GFR calc Af Amer: >60 mL/min (ref >60)
GFR calc Af Amer: >60 mL/min (ref >60)
GFR calc Af Amer: >60 mL/min (ref >60)
GFR calc non Af Amer: 51 mL/min — ABNORMAL LOW (ref >60)
GFR calc non Af Amer: 58 mL/min — ABNORMAL LOW (ref >60)
GFR calc non Af Amer: >60 mL/min (ref >60)
Glucose, Bld: 80 mg/dL (ref 70–99)
Glucose, Bld: 83 mg/dL (ref 70–99)
Glucose, Bld: 85 mg/dL (ref 70–99)
Potassium: 4.1 mEq/L (ref 3.5–5.1)
Potassium: 4.1 mEq/L (ref 3.5–5.1)
Potassium: 4.9 mEq/L (ref 3.5–5.1)
Sodium: 141 mEq/L (ref 135–145)
Sodium: 141 mEq/L (ref 135–145)
Sodium: 143 mEq/L (ref 135–145)

## 2010-04-26 LAB — PROTIME-INR
INR: 2.3 — ABNORMAL HIGH (ref 0.00–1.49)
INR: 2.9 — ABNORMAL HIGH (ref 0.00–1.49)
INR: 3.3 — ABNORMAL HIGH (ref 0.00–1.49)
INR: 3.9 — ABNORMAL HIGH (ref 0.00–1.49)
Prothrombin Time: 26.5 seconds — ABNORMAL HIGH (ref 11.6–15.2)
Prothrombin Time: 32.9 seconds — ABNORMAL HIGH (ref 11.6–15.2)
Prothrombin Time: 36.4 seconds — ABNORMAL HIGH (ref 11.6–15.2)
Prothrombin Time: 41.8 seconds — ABNORMAL HIGH (ref 11.6–15.2)

## 2010-04-26 LAB — MAGNESIUM: Magnesium: 1.9 mg/dL (ref 1.5–2.5)

## 2010-04-27 LAB — BASIC METABOLIC PANEL
BUN: 29 mg/dL — ABNORMAL HIGH (ref 6–23)
CO2: 27 mEq/L (ref 19–32)
Calcium: 9.9 mg/dL (ref 8.4–10.5)
Chloride: 103 mEq/L (ref 96–112)
Creatinine, Ser: 1.41 mg/dL (ref 0.4–1.5)
GFR calc Af Amer: >60 mL/min (ref >60)
GFR calc non Af Amer: 51 mL/min — ABNORMAL LOW (ref >60)
Glucose, Bld: 100 mg/dL — ABNORMAL HIGH (ref 70–99)
Potassium: 4.4 mEq/L (ref 3.5–5.1)
Sodium: 139 mEq/L (ref 135–145)

## 2010-04-27 LAB — PROTIME-INR
INR: 2.8 — ABNORMAL HIGH (ref 0.00–1.49)
Prothrombin Time: 31.8 seconds — ABNORMAL HIGH (ref 11.6–15.2)

## 2010-04-27 LAB — GLUCOSE, CAPILLARY
Glucose-Capillary: 75 mg/dL (ref 70–99)
Glucose-Capillary: 83 mg/dL (ref 70–99)

## 2010-04-27 LAB — CBC
HCT: 38.7 % — ABNORMAL LOW (ref 39.0–52.0)
Hemoglobin: 13.4 g/dL (ref 13.0–17.0)
MCHC: 34.7 g/dL (ref 30.0–36.0)
MCV: 89 fL (ref 78.0–100.0)
Platelets: 218 10*3/uL (ref 150–400)
RBC: 4.35 MIL/uL (ref 4.22–5.81)
RDW: 15.2 % (ref 11.5–15.5)
WBC: 5.8 10*3/uL (ref 4.0–10.5)

## 2010-04-27 LAB — APTT: aPTT: 52 seconds — ABNORMAL HIGH (ref 24–37)

## 2010-04-28 LAB — CBC
HCT: 42 % (ref 39.0–52.0)
Hemoglobin: 14.3 g/dL (ref 13.0–17.0)
MCHC: 34 g/dL (ref 30.0–36.0)
MCV: 89.6 fL (ref 78.0–100.0)
Platelets: 249 10*3/uL (ref 150–400)
RBC: 4.7 MIL/uL (ref 4.22–5.81)
RDW: 15.8 % — ABNORMAL HIGH (ref 11.5–15.5)
WBC: 6.3 10*3/uL (ref 4.0–10.5)

## 2010-04-28 LAB — BASIC METABOLIC PANEL
BUN: 30 mg/dL — ABNORMAL HIGH (ref 6–23)
CO2: 24 mEq/L (ref 19–32)
Calcium: 9.5 mg/dL (ref 8.4–10.5)
Chloride: 106 mEq/L (ref 96–112)
Creatinine, Ser: 1.43 mg/dL (ref 0.4–1.5)
GFR calc Af Amer: >60 mL/min (ref >60)
GFR calc non Af Amer: 50 mL/min — ABNORMAL LOW (ref >60)
Glucose, Bld: 107 mg/dL — ABNORMAL HIGH (ref 70–99)
Potassium: 4.5 mEq/L (ref 3.5–5.1)
Sodium: 137 mEq/L (ref 135–145)

## 2010-04-28 LAB — PROTIME-INR
INR: 3.3 — ABNORMAL HIGH (ref 0.00–1.49)
Prothrombin Time: 36.3 seconds — ABNORMAL HIGH (ref 11.6–15.2)

## 2010-04-28 LAB — GLUCOSE, CAPILLARY: Glucose-Capillary: 104 mg/dL — ABNORMAL HIGH (ref 70–99)

## 2010-04-28 LAB — APTT: aPTT: 59 seconds — ABNORMAL HIGH (ref 24–37)

## 2010-04-29 ENCOUNTER — Other Ambulatory Visit: Payer: Managed Care, Other (non HMO)

## 2010-05-12 ENCOUNTER — Telehealth: Payer: Self-pay | Admitting: *Deleted

## 2010-05-12 NOTE — Telephone Encounter (Signed)
lmom for pt to call me back in regards to his surgical clearance.  Per Dr Johney Frame will need to get heart (afib) stuff handled first

## 2010-05-13 ENCOUNTER — Telehealth: Payer: Self-pay | Admitting: *Deleted

## 2010-05-13 ENCOUNTER — Other Ambulatory Visit (INDEPENDENT_AMBULATORY_CARE_PROVIDER_SITE_OTHER): Payer: Managed Care, Other (non HMO)

## 2010-05-13 DIAGNOSIS — I4891 Unspecified atrial fibrillation: Secondary | ICD-10-CM

## 2010-05-13 DIAGNOSIS — Z7901 Long term (current) use of anticoagulants: Secondary | ICD-10-CM

## 2010-05-13 DIAGNOSIS — E039 Hypothyroidism, unspecified: Secondary | ICD-10-CM

## 2010-05-13 LAB — PROTIME-INR
INR: 6.1 ratio (ref 0.8–1.0)
Prothrombin Time: 56.9 s (ref 10.2–12.4)

## 2010-05-13 LAB — TSH: TSH: 4.93 u[IU]/mL (ref 0.35–5.50)

## 2010-05-13 NOTE — Telephone Encounter (Signed)
Spoke w/ aware of instructions says he will be out of town in a 1 but will f/u up when he gets back in town but informed any signs of bleeding he needs to go to ER.

## 2010-05-13 NOTE — Telephone Encounter (Signed)
Left msg for return call

## 2010-05-13 NOTE — Telephone Encounter (Signed)
Left message on pts home and cell phone for him to call back.

## 2010-05-13 NOTE — Telephone Encounter (Signed)
INR 3-26 was 3.2. Today's INR is 6.1. Current regimen is 22.5 mg weekly. Plan: Hold Coumadin for 2 days Then take Coumadin 2.5 mg every day, 17.5 mg weekly. ER if any signs of bleeding. Nurse visit for INR check in 10 days [facility]

## 2010-05-13 NOTE — Telephone Encounter (Addendum)
Received call from Clydie Braun at [facility] in regards to pt PT/INR forward to Dr. Drue Novel to review paper placed on ledge.   Last INR done 3/26-  3.2 Current regimen- 5mg  M, F                              2.5mg  daily

## 2010-05-16 ENCOUNTER — Telehealth: Payer: Self-pay | Admitting: Internal Medicine

## 2010-05-16 NOTE — Telephone Encounter (Signed)
Spoke with patient, patient aware copy of labs mailed and informed no change and recheck in 4 month. Patient ok'd understanding

## 2010-05-16 NOTE — Telephone Encounter (Signed)
Patient had labs last week - he wants to know if he should continue taking thyroid med based on lab result

## 2010-05-19 NOTE — Telephone Encounter (Signed)
Clearance done.

## 2010-05-31 NOTE — Op Note (Signed)
NAMEJAVIEN, [name]            ACCOUNT NO.:  [id number]   MEDICAL RECORD NO.:  [id number]          PATIENT TYPE:  INP   LOCATION:  0003                         FACILITY:  [facility]   PHYSICIAN:  Madlyn Frankel. Charlann Boxer, M.D.  DATE OF BIRTH:  [birth year]   DATE OF PROCEDURE:  08/07/2006  DATE OF DISCHARGE:                               OPERATIVE REPORT   PREOPERATIVE DIAGNOSIS:  Right hip osteoarthritis.   POSTOPERATIVE DIAGNOSIS:  Right hip osteoarthritis.   PROCEDURE:  Right total hip replacement.   COMPONENTS USED:  DePuy hip system size, 54 ASR cup, size 6 high offset  Trilock stem with a 47 ASR ball with +8 adapter.   SURGEON:  Madlyn Frankel. Charlann Boxer, M.D.   ASSISTANT:  Yetta Glassman. Mann, P.A.-C.   ANESTHESIA:  General.   BLOOD LOSS:  500 mL.   DRAINS:  None.   COMPLICATIONS:  None.   INDICATIONS FOR PROCEDURE:  [name] is a 65 year old gentleman who  presented to the office for evaluation of some right groin pain.  Radiographs confirmed concerns about right hip osteoarthritis.  Attempts  at conservative measures failed to provide adequate relief.  Based on  decreased quality of life and persistent discomfort, he wished to  proceed with surgical intervention.  Consent was obtained after  reviewing the risks of infection, dislocation, component failure,  discussion of bearing surface.  He was at increased risk for infection  due to diabetes.  Consent was obtained.   PROCEDURE IN DETAIL:  The patient was brought to the operative theater.  Once adequate anesthesia and preoperative antibiotics, 2 grams Ancef,  were administered, the patient was positioned in the left lateral  decubitus position with the right side up. The right lower extremity pre-  scrubbed and prepped and draped in a sterile fashion.  The lateral based  incision was made for a posterior approach to the hip.  The iliotibial  band and gluteus fascia were incised posteriorly.  The short external  rotators were  identified and taken down separate from the posterior  capsule which was entered.  An L-capsulotomy was made preserving the  posterior leaflet to protect the sciatic nerve from retractors as well  as anatomic repair post procedure.   The head was then dislocated and landmarks identified.  A neck osteotomy  was made based off preoperative templating.  Following the neck  osteotomy, attention was first directed to the femur.  A starting reamer  followed by a hand reamer and then irrigating the canal to prevent fat  emboli.  We began broaching with a size 1 broach and broached all the  way up initially to a size 5.  This at the level of my neck cut.  I did  have to use a calcar planer to try to plane this off after this cut.   Following this I attended to the acetabulum.  Please note that the  femoral anteversion was set at about 20 degrees based off the leg at  perpendicular.  Acetabular exposure was obtained, the patient had very  tight soft tissues and muscles.  There were noted to be anterior  and  posterior osteophytes.  Following labrectomy, I began reaming with the  45 reamer and carried this up to a 53 reamer.  At this point, I  identified there was excellent bony bed preparation and was able to see  the reamer in the superior rim, this probably placed the cup with a  little bit of a higher hip center by a few millimeters.  This was taken  into consideration.  Following this reaming and bone preparation, I did  impact the 54 ASR cup.  This was impacted, identifying the mark on the  anterior wall, and set my anteversion at 15-20 degrees.  The cup  appeared to be in an anatomic position, was well seated within bone. I  did remove some osteophyte at this point on the anterior aspect of the  hip as well as posteriorly.   Attention was now directed to the trial reduction with a size 5 broach  placed at the level of the neck cut, a lateralized offset neck,  initially a +2 adapter.  I  felt that there was a lot of shuck in the hip  and compared to the down leg compared to my preoperative positioning, I  felt that there was some shortening on this side.  Given the amount that  I identified, I had to use the size 6 broach, took the 5 broach out, and  used the 6 broach and this sat a few mm proud to the neck cut.  I re-  trialed, first with a 2 and then all the way up to a +8 adapter.  The +8  adapter got my leg lengths back to equal compared to the down leg based  on the position of the patella and then the heel where the knee bent  compared to the down leg.  More importantly, there was still about 1-2  mm of shuck. The hip was noted be very stiff and tight at neutral  abduction and hip flexion to about 8 degrees,  tolerated 60-70 degrees  internal rotation, the combined anteversion was felt to be approximately  45 degrees.  I felt this was more soft tissue type impingement despite  removing osteophytes anteriorly.  There was no evidence of impingement  with external rotation and extension.  The patient was stable in the  sleep position with abduction of 30 degrees and internal rotation to 60  degrees.   Given all this, the final size 6 high offset stem was utilized.  I then  retrialed and chose to use a 47 +8 adapter ASR ball.  The hip was  reduced. Throughout the case, the hip was irrigated. At this point,  there was no active bleeding that needed to be hemostased.  I did  reapproximate the posterior capsular leaflet to the superior leaflet.  I  then injected 5 mL of FloSeal into the posterior capsule.  The  iliotibial band was reapproximated using #1 Ethibond, the gluteus fascia  with a running #1 Vicryl.  The remainder of the wound was closed in  layers with 2-0 Vicryl and 4-0 running Monocryl.  The patient's hip was  then cleaned, dried, and dressed sterilely with Steri-Strips and a  Mepilex dressing.  He is brought to the recovery room extubated in  stable  condition.      Madlyn Frankel Charlann Boxer, M.D.  Electronically Signed     MDO/MEDQ  D:  08/07/2006  T:  08/07/2006  Job:  [id number]

## 2010-05-31 NOTE — Assessment & Plan Note (Signed)
[facility]                         ELECTROPHYSIOLOGY OFFICE NOTE   [name]                   MRN:          [id number]  DATE:09/10/2006                            DOB:          [birth year]    [name] was seen last week by Dr. Eden Emms because of atrial  fibrillation occurring postoperatively following a hip replacement  undertaken in July.  He has a long-standing history of atrial  fibrillation which has been controlled with Flecainide 150/100.   Dr. Eden Emms, because of weight control, changed his atenolol from 50 to  Coreg 25 b.i.d..  The patient developed significant hypotension.  He has  had significant symptoms with his atrial fibrillation; and he needs to  get back to work and would like to get restored to sinus rhythm; and, I  think, that that is reasonable.  His other medications include  metformin, hydrochlorothiazide, Tricor 140 and Actos 45.  I should note  that his Accupril has also been intercurrently discontinued since his  hospitalization because of his low blood pressure.  [name] has been  checking his INRs at home; and they have been therapeutic for the last 5  weeks.   PHYSICAL EXAM:  Blood pressure 122/72, pulse 60 and irregular.  Lungs were clear.  Heart sounds were regular without murmurs.  Extremities were without edema.   ELECTROCARDIOGRAM:  Dated Friday demonstrated atrial fibrillation with a  rate of 103.  There were intervals of 0.12/0.38.  There was evidence of  right bundle branch block which is old.   IMPRESSION:  1. Atrial fibrillation, persistent.  2. Thromboembolic risk factors notable for      a.     Hypertension      b.     Diabetes.  3. Intercurrent discontinuation of Accupril.  4. Status post hip replacement.   PLAN:  1. I suspect that [name] hip surgery and recovery are      responsible for the triggering of his Atrial fibrillation although      it is a little bit late.  They may  be truly unrelated.  2. In any case, I think that we will undertake cardioversion.  This      therapeutic INRs are confirmed.  3. As relates to his diabetes and his renal protection we will      reinitiate therapy with Accupril albeit at 10 mg a day and at the      same time we will stop his hydrochlorothiazide.  4. For now we will continue to monitor his atenolol at 50 mg a day      with consideration of uptitration of this if necessary.     Jeffrey Salvia, MD, [facility]  Electronically Signed    SCK/MedQ  DD: 09/10/2006  DT: 09/10/2006  Job #: [id number]   cc:   Titus Dubin. [name], MD,FACP,FCCP

## 2010-05-31 NOTE — Assessment & Plan Note (Signed)
[facility]                                 ON-CALL NOTE   DANNA, CASELLA                   MRN:          [id number]  DATE:04/12/2008                            DOB:          Oct 29, 1945    Telephone conversation with Bernestine Amass, patient of Dr. Berton Mount.   I received a page through the answering service on April 12, 2008, from  Mr. Quiroa.  He states he has a history of atrial fib flutter.  He has  maintained on flecainide and atenolol.  He states he can usually tell  when he goes back into atrial fib.  He just recently was on a cruise  ship, states he just returned home yesterday, states compliance with his  medications.  However, on the cruise, he drank a lot more caffeine and  ate a lot more foods that he normally does.  He states, he thinks the  increased stress caused his atrial fib and usually it resolves  spontaneously, but he states that has continued since yesterday.  He  actually had some old Coreg at home.  He took a half one of these, but  that did not seem to help.  He states he is completely asymptomatic, but  he is just aware of the palpitations.  He reports his last INR was  therapeutic prior to his cruise.  He does not really want to come into  the emergency room, but he does not want to ignore this either.  I  instructed him we could do 2 things, we could either have him take a  full dose of flecainide this evening instead of the 75 mg, have him take  it easy today and see if this helps or he can go ahead and come in to  the emergency room.  He opted to try and take the extra half of his  flecainide tonight and then called the office in the morning to speak  with Dr. Odessa Fleming nurse.  The other possibility would be a cardioversion.  He would rather wait and speak with Dr. [name] in the a.m.      Dorian Pod, ACNP  Electronically Signed      Bevelyn Buckles. Bensimhon, MD  Electronically Signed   MB/MedQ   DD: 04/12/2008  DT: 04/13/2008  Job #: [id number]

## 2010-05-31 NOTE — Discharge Summary (Signed)
NAMECAMERAN, [name] NO.:  [id number]   MEDICAL RECORD NO.:  [id number]          PATIENT TYPE:  INP   LOCATION:  2013                         FACILITY:  [facility]   PHYSICIAN:  Everitt Wenner. Excell Seltzer, MD  DATE OF BIRTH:  March 08, 1945   DATE OF ADMISSION:  06/12/2008  DATE OF DISCHARGE:  06/15/2008                               DISCHARGE SUMMARY   PRIMARY CARDIOLOGIST:  [name], MD, [facility]   PRIMARY CARE:  Titus Dubin. [name], MD,FACP,FCCP   DISCHARGE DIAGNOSES:  1. Atrial fibrillation with Tikosyn loading this admission.  The      patient is being discharged home on Tikosyn 500 mcg p.o. b.i.d.      Most recent EKG obtained at 10 o'clock this morning showed a QT of      450 and a QTC of 456.  2. Anticoagulation therapy with Coumadin.  The patient's INR was      therapeutic at time of discharge at 2.3.  The patient instructed to      continue his previous Coumadin therapy dosing.   PAST MEDICAL HISTORY:  1. Atrial fibrillation.  2. Previous therapies and cue include flecainide, direct current      cardioversions x2 Rythmol.  3. Diabetes.  4. Hypertension.  5. Radiofrequency catheter ablation for atrial flutter back in 1990s.  6. Right total hip arthroplasty.   HOSPITAL COURSE:  [name] is a 65 year old gentleman admitted for  Tikosyn loading that has been unresponsive to previous therapies.  Mr.  Peters had initial workup done 12-lead EKG obtained.  HCTZ put on  hold.  Therapeutic INR.  The patient tolerates Tikosyn therapy without  complications.  On Jun 14, 2008, the patient noted to be in sinus rhythm  at a controlled rate.  Dr. Excell Seltzer in to see the patient on Jun 15, 2008.   LABORATORIES:  Stable.  Potassium 4.1 and INR 2.3.  Documented QTc of  477 msec.  Blood pressure 112/71, heart rate 65 and afebrile.  The  patient is stable to be discharged home.  Maintaining sinus rhythm on  Tikosyn.  Continue current dose.  Follow up with Dr. [name].  Atenolol  dose has been decreased to 25 mg daily.  The patient to have a visit in  Dr. Frederik Pear office for Coumadin Clinic dosing.   MEDICATIONS AT TIME OF DISCHARGE:  The patient has been instructed to  hold his HCTZ until further followup with Dr. [name].  He may continue  the:  1. Accupril 40 mg daily.  2. Actos 45.  3. Atenolol has been decreased to 25 mg daily.  4. Metformin 1000 mg b.i.d.  5. Coumadin 5 mg daily.  6. Glimepiride 1 mg daily.  7. TriCor 140 mg daily.  8. Tikosyn 500 mcg b.i.d.   DURATION OF DISCHARGE ENCOUNTER:  30 minutes.      Dorian Pod, ACNP      Veverly Fells. Excell Seltzer, MD  Electronically Signed    MB/MEDQ  D:  06/15/2008  T:  06/15/2008  Job:  [id number]

## 2010-05-31 NOTE — Op Note (Signed)
NAMEZACKARIA, BURKEY            ACCOUNT NO.:  [id number]   MEDICAL RECORD NO.:  [id number]          PATIENT TYPE:  OIB   LOCATION:  2899                         FACILITY:  [facility]   PHYSICIAN:  Hillis Range, MD       DATE OF BIRTH:  May 16, 1945   DATE OF PROCEDURE:  DATE OF DISCHARGE:                               OPERATIVE REPORT   PREPROCEDURE DIAGNOSIS:  Persistent atrial fibrillation.   POSTPROCEDURE DIAGNOSES:  Persistent atrial fibrillation.   PROCEDURE:  Cardioversion.   INTRODUCTION:  [name] is a pleasant 65 year old gentleman with  symptomatic persistent atrial fibrillation who presents today for  cardioversion.  He reports initially being diagnosed with atrial  fibrillation around 2001.  He has required cardioversion in the past.  He is treated medically with flecainide.  He reports that approximately  5 days ago he developed acute onset of palpitations with associated  shortness of breath.  He has maintained atrial fibrillation since that  time.  He therefore presents today for cardioversion.   DESCRIPTION OF PROCEDURE:  Informed written consent was obtained and the  patient was brought to the short-stay area.  He was noted to be in  atrial fibrillation upon presentation.  Adequate IV access and airway  support were assured.  The patient was confirmed to have therapeutic  INRs over the past few months.  He was adequately sedated by anesthesia  as outlined in their report.  The patient was then successfully  cardioverted to sinus rhythm with a single synchronized 200 joules  biphasic shock with cardioversion electrodes in the anterior-posterior  thoracic configuration.  He remained in sinus rhythm thereafter.  There  were no early apparent complications.   CONCLUSIONS:  1. Persistent atrial fibrillation.  2. Successful cardioversion to sinus rhythm.  3. No early apparent complications.      Hillis Range, MD  Electronically Signed     JA/MEDQ  D:   04/14/2008  T:  04/15/2008  Job:  [id number]   cc:   [name], MD, [facility]

## 2010-05-31 NOTE — Assessment & Plan Note (Signed)
[facility]                         GASTROENTEROLOGY OFFICE NOTE   [name], [name]                   MRN:          [id number]  DATE:04/30/2007                            DOB:          1945-08-29    REASON FOR CONSULTATION:  [name] is a 65 year old white male with  intermittent rectal bleeding.   HISTORY:  [name] has had intermittent rectal bleeding for several  years and has no hemorrhoids with a colonoscopy examination last  performed in May 2006.  He recently saw [name]. [name] and had  some Hemoccult-positive stools on routine checking and had a normal CBC.  He is diabetic and had a normal metabolic profile with the hemoglobin  A1c of 6.1% and a PSA of 0.76.  The patient does have some chronic  constipation and straining, but denies abdominal pain or any upper GI or  hepatobiliary complaints.  His appetite is good and his weight is  stable, but he does not follow a high fiber diet.  He denies any  symptoms of hypovolemia.   PAST MEDICAL HISTORY:  1. He has a somewhat involved past medical history revolving around      paroxysmal atrial fibrillation with chronic Coumadin      anticoagulation.  He is followed closely by [name].      He apparently has had an extensive workup without any evidence of      atherosclerotic cardiovascular disease.  2. He had a hip replacement this last fall by [name].  He came off      his Coumadin for that operation without difficulty.  3. His diabetes is also managed by [name].  4. Hyperlipidemia with [name] in the Coumadin Clinic.  5. He has had some mild renal insufficiency from his diabetes,      peripheral edema.  6. He has symptoms consistent with degenerative arthritis.  7. He has had treated essential hypertension for many years.   MEDICAL DECISION MAKING:  1. Atenolol 50 mg daily.  2. Metformin 1000 mg twice daily.  3. Coumadin daily.  4. Flecainide  150 mg in the morning, 100 mg at bedtime.  5. Hydrochlorothiazide 25 mg daily.  6. Tri-Chlor 145 mg daily.  7. Glimepiride 1 mg each morning.  8. Actos 45 mg in the evening.  9. Accupril 40 mg daily.  10.Osteo-Bi-FLEX daily.   ALLERGIES:  Denied.   FAMILY HISTORY:  Noncontributory.   SOCIAL HISTORY:  The patient is married.  He lives with his wife and  works for [employer].  He does not smoke or abuse ethanol or NSAIDs.   REVIEW OF SYSTEMS:  The patient denies any current cardiovascular or  pulmonary complaints.  His main complaint today is one of swelling of  his hands and stiffness in the morning.  The review of systems otherwise  noncontributory.   PHYSICAL EXAMINATION:  GENERAL:  He is a healthy-appearing white male,  in no distress, appearing his stated age.  VITAL SIGNS:  Weight 214 pounds, blood pressure 120/76, pulse 68 and  regular.  HEENT:  I could not appreciate  stigmata of chronic liver disease or  thyromegaly.  CHEST:  Clear.  HEART:  He was in a regular rhythm without murmurs, gallops or rubs.  ABDOMEN:  No organomegaly, masses or localized tenderness.  Bowel sounds  were normal.  EXTREMITIES:  Peripheral extremities showed no edema, phlebitis or  swollen joints, except for some thickness and swelling of his hands  bilaterally.  Peripheral pulses seem good.  NEUROLOGIC:  Mental status was clear.  RECTAL:  Showed some external hemorrhoids with stigmata of recent  bleeding, but no fissures or fistulae.  A digital examination was  deferred.   ASSESSMENT:  1. Intermittent rectal bleeding from external hemorrhoids, associated      with chronic constipation due to lack of fiber in his diet.  2. 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  3. Status post total hip repair in July.  4. Adult onset diabetes mellitus with slight renal insufficiency with      recent  serum creatinine 1.4 mg%.  5. History of benign prostatic hypertrophy.   RECOMMENDATIONS:  1. We have scheduled [name] for outpatient colonoscopy.  Will      hold his Coumadin five days before this procedure.  Otherwise      continue his cardiac medications.  2. A high fiber diet with one tablespoonful of Benefiber and cereal      each morning and liberal p.o. fluids.  3. Local anal care p.r.n. with Sitz baths and AnaMantle cream.  4. Continue medical followup with Dr. [name] and with [name].     [name]. [name], [name], [name], [name]  Electronically Signed    DRP/MedQ  DD: 04/30/2007  DT: 04/30/2007  Job #: [id number]   cc:   [name], [name], [name]  [name]. [name], [name],FACP,FCCP

## 2010-05-31 NOTE — Assessment & Plan Note (Signed)
[facility]                         ELECTROPHYSIOLOGY OFFICE NOTE   [name], [name]                   MRN:          [id number]  DATE:12/21/2006                            DOB:          12-23-1945    [name] has atrial fibrillation and this is currently under good  control with his flecainide.  He also has hypertension on the context of  his diabetes, and recently his blood pressure was noted to be elevated.  He has also noted a concomitant increase in weight and this has been  accompanied by a sensation of fullness in his hands, as well as in his  feet.  He is not sure to what degree this may be related to dietary  changes associated with the Thanksgiving holidays.   He continues to be concerned, at least is worried about his weight.  He  has not yet begun to be active.   His blood pressure is 150/90, his pulse was 67.  LUNGS:  Clear.  HEART SOUNDS:  Regular today, without murmurs or gallops.  SKIN:  Warm and dry.  He had 1+ peripheral edema.   His electrocardiogram demonstrated incomplete right bundle branch block.   IMPRESSION:  1. Atrial fibrillation, paroxysmal, on flecainide.  2. Hypertension.  3. Diabetes.  4. Coumadin therapy for the above.  5. Peripheral edema.   I am going to go ahead and give him a prescription today for furosemide  20 mg to take as needed, initially every other day, and then to resume  his hydrochlorothiazide at 25 mg a day.  He can take the Lasix on an as-  needed basis and he is to follow up with Dr. [name] about his blood  pressure in the not-distant future and we will see him again in six  months or so.     [name], MD, [facility]  Electronically Signed    SCK/MedQ  DD: 12/21/2006  DT: 12/21/2006  Job #: [id number]   cc:   Titus Dubin. [name], MD,FACP,FCCP

## 2010-05-31 NOTE — H&P (Signed)
[name], [name]            ACCOUNT NO.:  [id number]   MEDICAL RECORD NO.:  [id number]          PATIENT TYPE:  INP   LOCATION:  NA                           FACILITY:  [facility]   PHYSICIAN:  Madlyn Frankel. Charlann Boxer, M.D.  DATE OF BIRTH:  26-Jun-1945   DATE OF ADMISSION:  DATE OF DISCHARGE:                              HISTORY & PHYSICAL   ATTENDING PHYSICIAN:  Dr. Durene Romans.   REASON FOR ADMISSION:  Right total hip arthroplasty.   CHIEF COMPLAINTS:  Right hip groin pain.   HISTORY OF PRESENT ILLNESS:  A 65 year old male with a history  persistent progressive right hip groin pain.  It has been refractory to  all conservative treatments.  He has been presurgical assessed by Dr.  [name].   PAST MEDICAL HISTORY:  1. Osteoarthritis  2. Hypertension  3. Atrial fibrillation  4. Degenerative disk disease  5. Diabetes, non-insulin type 2.   PAST SURGICAL HISTORY:  Ablation in 2001, cardioversion x3.   FAMILY HISTORY:  The patient is married.  [name] will be primary  caregiver after surgery.   FAMILY HISTORY:  Diabetes, cancer, stroke.   DRUG ALLERGIES:  NO KNOWN DRUG ALLERGIES.   MEDICATIONS:  1. Actos 45 mg one p.o. daily  2. Metformin 1000 mg one b.i.d.  3. Tricor 145 mg 1 tablet daily  4. Glimepiride 2 mg half tablet q.a.m.  5. Allergy shots  6. HCTZ 25 mg 1 tablet daily  7. Atenolol 50 mg 1 tablet daily  8. Flecainide 1.5 tablets q.a.m. and 1 tablet q.p.m.  9. Quinapril HCl 40 mg 1 tablet daily  10.Coumadin 5 mg Monday, Wednesday, Friday, 2.5 mg Tuesday, Thursday,      Saturday, Sunday.   REVIEW OF SYSTEMS:  MUSCULOSKELETAL:  Having some left-sided sciatica  symptoms.  Otherwise see HPI.   PHYSICAL EXAMINATION:  Pulse 72, respirations 18, blood pressure 134/84.  GENERAL:  He is awake, alert and oriented, well-developed, well-  nourished, no acute distress.  NECK:  Supple.  No carotid bruits.  CHEST/LUNGS:  Clear to auscultation bilaterally.  BREASTS:   Deferred.  HEART:  Regular rate and rhythm without gallops, clicks, rubs or  murmurs.  ABDOMEN:  Soft, nontender, nondistended.  Bowel sounds present.  GENITOURINARY:  Deferred.  EXTREMITIES:  Right hip has limited internal range of motion with  increased pain.  SKIN:  No cellulitis.  No breakdown.  NEUROLOGIC:  Intact distal sensibilities.   Labs, EKG, chest x-ray pending.   IMPRESSION:  1. Right hip osteoarthritis  2. Hypertension.  3. Atrial fibrillation  4. Diabetes type 2  5. Degenerative disk disease.   PLAN OF ACTION:  Right total hip arthroplasty.  Risks and complications  were discussed.  Questions were encouraged answered and reviewed.   Postoperative medications including Lovenox, muscle relaxers as well as  over-the-counter medications were prescribed preoperatively.     ______________________________  Yetta Glassman [address], [place]      Madlyn Frankel. Charlann Boxer, M.D.  Electronically Signed    BLM/MEDQ  D:  08/03/2006  T:  08/03/2006  Job:  [id number]   cc:   Chrissie Noa  Minerva Ends, MD,FACP,FCCP  [phone] [place]  [place] [address]

## 2010-05-31 NOTE — H&P (Signed)
NAMEULRICH, SOULES NO.:  [id number]   MEDICAL RECORD NO.:  [id number]          PATIENT TYPE:  INP   LOCATION:  2013                         FACILITY:  [facility]   PHYSICIAN:  [name], MD, FACCDATE OF BIRTH:  08/29/1945   DATE OF ADMISSION:  06/12/2008  DATE OF DISCHARGE:                              HISTORY & PHYSICAL   Arranging this patient's admission and Tikosyn therapy throughout the  next 3- to 4-day period, took in excess of 2 hours.  This patient has no  known drug allergies.  His primary caregiver is Dr. Chrissie Noa hopper.   PRESENTING CIRCUMSTANCE:  I am here for THE MEDICINE.   HISTORY OF PRESENT ILLNESS:  Mr. Markson is a 65 year old male who has  a history of atrial arrhythmias.  He had an atrial flutter ablation in  the late 1990s.  He was diagnosed with atrial fibrillation in the year  2000.  Of note, this patient does not have any coronary artery disease.  After diagnosis, the patient was well controlled for a while on  flecainide.  His symptoms with atrial fibrillation are weakness, a  perception that his heart is skipping, and he is short of breath,  especially with any exertion.  Since April 2010, the patient has had  breakthrough even on medications such as like flecainide and Rythmol.  He has had direct current cardioversion x2 on flecainide with reversion  and a direct current cardioversion x1 on Rythmol with reversion to  atrial fibrillation each time.  He currently presents for Tikosyn  therapy.  He is in atrial fibrillation, controlled ventricular rate.  His last Rythmol tablet was Tuesday, Jun 09, 2008.  The patient is on  hydrochlorothiazide, this will be placed on hold, and his creatinine on  admission is 1.6.   CARDIAC RISK FACTORS:  Hypertension.   CARDIAC RISK EQUIVALENT:  Diabetes.   MEDICATIONS:  1. Coumadin 5 mg, Monday, Wednesday, Friday; 2.5 mg all other days.  2. Actos 45 mg daily.  3. Metformin 1000 mg twice  daily.  4. TriCor 145 mg daily.  5. Glimepiride 2 mg one-half tablet each morning.  6. Atenolol 50 mg daily.  7. Quinapril 40 mg daily.  8. Coumadin as mentioned above.   SOCIAL HISTORY:  The patient lives in [place] with his wife.  He is a  Emergency planning/management officer.  He does not smoke, does not take alcoholic beverages  or recreational drugs.   FAMILY HISTORY:  Mother died at age 95.  She had mild dementia.  Father  died in his 14s of cerebrovascular accident, he had diabetes.  He has  one brother alive and well.   REVIEW OF SYSTEMS:  GENERAL:  No fevers, chills, night sweats, or weight  change.  HEENT:  No headaches.  No nasal discharge.  No epistaxis.  No  voice changes.  No vertigo.  INTEGUMENT:  No rashes.  No nonhealing  ulcerations.  The patient does complain of leg cramps at night.  CARDIOPULMONARY:  No chest pain.  He does get short of breath with  atrial fibrillation.  No orthopnea.  No paroxysmal  nocturnal dyspnea.  No peripheral edema.  He does have a feeling at times of presyncope.  He  has never had frank syncope, and he does experience palpitations.  UROGENITAL:  No frequency or urgency.  No dysuria.  No hematuria, but he  does get up once or twice a night to relieve himself.  NEUROPSYCHIATRIC:  The patient complains of weakness and does have anxiety.  He has a high-  pressure job, and the presence of atrial fibrillation does not help him  cope.  GASTROINTESTINAL: No nausea, vomiting, diarrhea.  No melena.  No  bright red blood per rectum.  He is not complaining of any reflux  symptoms.  ENDOCRINE:  The patient has diabetes.  No polyuria.  No  polydipsia.  MUSCULOSKELETAL: The patient had a right total hip  arthroplasty in the past, is not complaining of any significant  arthralgias at the present time except for pain in the left shoulder  especially when he extends it to reach.  All other systems have been  considered and they are negative.   PHYSICAL EXAMINATION:  VITAL  SIGNS:  Temperature 98.8, pulse is 96,  blood pressure 112/69, respirations 18, oxygen saturation 97% on room  air.  GENERAL:  The patient is alert and oriented x3.  HEENT:  Normocephalic, atraumatic.  Eyes, pupils equal, round, reactive  to light.  Extraocular movements are intact.  Sclerae are anicteric.  NECK:  Supple without carotid bruits.  No thyromegaly.  No jugular  venous distention.  HEART:  Irregular rate and rhythm without murmur, rub, or gallop.  LUNGS:  Clear to auscultation bilaterally.  ABDOMEN:  Soft, nondistended.  Bowel sounds are present.  No rebound or  guarding.  Deeper structures not palpated.  When the patient lifts his  head, there is evidence of a ventral hernia.  EXTREMITIES: No clubbing, cyanosis, or edema.  MUSCULOSKELETAL:  No joint deformities effusions, kyphosis, or  scoliosis.  NEUROLOGIC:  Alert and oriented x3.  Cranial nerves II through XII  grossly intact.  Gait is preserved.  Grip strength 5/5 in both upper  extremities, and plantar flexion and dorsiflexion preserved.   Electrocardiogram on Jun 12, 2008, atrial fibrillation, rate is 83, QRS  is 96, QTc estimated at 450 milliseconds.   Laboratory studies drawn on Jun 09, 2008, sodium 143, potassium 4.7,  chloride 108, carbonate 29, BUN is 40, creatinine 1.6, magnesium 2.2,  glucose is 80.  Protime 48, INR is 4.9 on Jun 09, 2008.  We will get a  stat protime.   IMPRESSION:  1. Atrial fibrillation diagnose in the year 2000.  Symptoms are      weakness, shortness of breath, and heart skipping      a.     He had a breakthrough to atrial fibrillation on flecainide       and breakthrough to atrial fibrillation on Rythmol.  2. He failed direct current cardioversion x3 since April 2010.  3. Diabetes.  4. Hypertension.  5. History of radiofrequency catheter ablation of atrial flutter in      the late 1990s.  6. Right total hip arthroplasty.   The patient has an protime today is 41.8, INR is 3.9.  I  believe we will  be holding his Coumadin tonight.  The patient presents for Tikosyn  therapy.  He was seen and examined by Dr. Gala Romney in consult with the  physician assistant, and after reviewing laboratories and an EKG, the  patient will start on Tikosyn 500 mcg  twice daily at 8:00 p.m. on  Friday, Jun 12, 2008.      Maple Mirza, [place]      [name], MD, [facility]  Electronically Signed    GM/MEDQ  D:  06/12/2008  T:  06/13/2008  Job:  [id number]

## 2010-05-31 NOTE — Discharge Summary (Signed)
NAMEMORDECAI, TINDOL NO.:  [id number]   MEDICAL RECORD NO.:  [id number]          PATIENT TYPE:  INP   LOCATION:  2013                         FACILITY:  [facility]   PHYSICIAN:  Devynn Scheff. Excell Seltzer, MD  DATE OF BIRTH:  08/09/1945   DATE OF ADMISSION:  06/12/2008  DATE OF DISCHARGE:  06/15/2008                               DISCHARGE SUMMARY   ADDENDUM   MEDICATION:  I do not know as I recall, stating that the patient was on  Accupril 40 mg daily or not, but this medication will be continued at  the time of discharge.      Dorian Pod, ACNP      Veverly Fells. Excell Seltzer, MD  Electronically Signed    MB/MEDQ  D:  06/15/2008  T:  06/15/2008  Job:  [id number]

## 2010-05-31 NOTE — Discharge Summary (Signed)
NAMEDOMINGOS, RIGGI NO.:  [id number]   MEDICAL RECORD NO.:  [id number]          PATIENT TYPE:  OIB   LOCATION:  2899                         FACILITY:  [facility]   PHYSICIAN:  Pricilla Riffle, MD, FACCDATE OF BIRTH:  [birth year]   DATE OF ADMISSION:  04/24/2008  DATE OF DISCHARGE:  04/24/2008                               DISCHARGE SUMMARY   IDENTIFICATION:  The patient is a 65 year old with history of atrial  fibrillation.  He was cardioverted on March 30th while he was on  flecainide 150/75 daily.  He converted to sinus rhythm but has since  reverted to atrial fibrillation.  His flecainide was increased to 150  b.i.d.  He re-presents today for repeat cardioversion.   The patient was anesthetized per Anesthesia with 100 mg propofol IV.  With pads in the AP position, the patient was cardioverted with 200  joules synchronized biphasic energy to sinus rhythm.  A 12-lead EKG was  pending.  Procedure was without complications.      Pricilla Riffle, MD, [facility]  Electronically Signed     PVR/MEDQ  D:  04/24/2008  T:  04/25/2008  Job:  [id number]

## 2010-05-31 NOTE — Discharge Summary (Signed)
[name], [name] NO.:  [id number]   MEDICAL RECORD NO.:  [id number]          PATIENT TYPE:  INP   LOCATION:  2013                         FACILITY:  [facility]   PHYSICIAN:  Zedric Deroy. Excell Seltzer, MD  DATE OF BIRTH:  11/14/1945   DATE OF ADMISSION:  06/12/2008  DATE OF DISCHARGE:  06/15/2008                               DISCHARGE SUMMARY   ADDENDUM:   DISCHARGE MEDICATIONS:  Magnesium oxide 400 mg daily and potassium 20  mEq p.o. daily.   The patient will need a follow up BMET drawn at the office when he sees  Dr. [name].  Office will call the patient with followup date and the  patient is scheduled for followup appointment with Dr. Verdell Carmine office  with PT/INR.      Dorian Pod, ACNP      Veverly Fells. Excell Seltzer, MD  Electronically Signed    MB/MEDQ  D:  06/15/2008  T:  06/15/2008  Job:  [id number]

## 2010-05-31 NOTE — Op Note (Signed)
NAMEJALEEN, FINCH            ACCOUNT NO.:  [id number]   MEDICAL RECORD NO.:  [id number]          PATIENT TYPE:  OIB   LOCATION:  2853                         FACILITY:  [facility]   PHYSICIAN:  Doylene Canning. Ladona Ridgel, MD    DATE OF BIRTH:  [birth year]   DATE OF PROCEDURE:  09/12/2006  DATE OF DISCHARGE:                               OPERATIVE REPORT   PROCEDURE PERFORMED:  DC cardioversion.   INDICATION:  Symptomatic atrial fibrillation.   INTRODUCTION:  The patient is a 65 year old man with a history of atrial  fibrillation, hypertension, diabetes, who has done very nicely with  regard of maintenance of sinus rhythm but has developed recurrent atrial  fibrillation, which has been persistent despite flecainide therapy, and  is not referred for DC cardioversion.   PROCEDURE:  After informed consent was obtained, the patient was placed  in the supine position and the electrodispersive pads were placed on his  chest and back in the anterior, posterior orientation.  Eh was sedated  with 300 mg of sodium Pentothal under the direction of Dr. Sharee Holster of the anesthesia department.  A single 200 joule synchronized  DC cardioversion was delivered, which restored sinus rhythm.  The  patient tolerated the procedure well.   COMPLICATIONS:  There were no immediate complications and on the floor  has also demonstrated successful DC cardioversion in a patient with  symptomatic atrial fibrillation.      Doylene Canning. Ladona Ridgel, MD  Electronically Signed     GWT/MEDQ  D:  09/12/2006  T:  09/12/2006  Job:  [id number]   cc:   [name], MD, [facility]

## 2010-05-31 NOTE — Assessment & Plan Note (Signed)
[facility]                         ELECTROPHYSIOLOGY OFFICE NOTE   KINGSTYN, DERUITER                   MRN:          [id number]  DATE:12/16/2007                            DOB:          28-Oct-1945    Mr. Nowling was seen in followup for atrial flutter and atrial  fibrillation.  He takes flecainide for this in the context of his  diabetes and hypertension.  He is also on Coumadin.  Over the last year  and a half, he has had only a couple of brief episodes of tachycardia,  one of which was precipitated by overeating and the other by stress.   He is doing quite well.  He has changed jobs within [employer].  The stress  is somewhat less.   MEDICATIONS:  1. Atenolol 50.  2. Metformin 1000 b.i.d.  3. Coumadin.  4. Hydrochlorothiazide 25.  5. Tri-Chlor.  6. Glimepiride 1.  7. Actos 45.  8. Accupril 40.  9. Flecainide 150/75.   On examination, his blood pressure was 118/74, his pulse was 66, his  weight is 220, which is up 15-18 pounds in the last 16 months.  His  lungs were clear.  Heart sounds were regular.  Neck veins were flat.  The abdomen was protuberant but soft.  No hepatomegaly was noted.  The  extremities were without edema.  The skin was warm and dry.   Electrocardiogram dated today demonstrated sinus rhythm at 67 with  interval of 0.18/0.14/0.42.  The axis of 28.   IMPRESSION:  1. Atrial fibrillation - paroxysmal.  2. Flecainide therapy for atrial fibrillation - paroxysmal.  3. Right bundle-branch block - new/progressive QRS prolongation.  4. Diabetes.  5. Obesity.  6. Hypertension.   Mr. Martos big issues are risk factor modification in terms of his  weight, diabetes, and hypertension.   He is taking his flecainide and his atrial fibrillation is largely  quiescent.   I am not all that concerned about the QRS duration.  We will need to  keep a close eye on this as flecainide can be associated with conduction  system  disease.   We will see him again in 6 months' time.     [name], MD, [facility]  Electronically Signed    SCK/MedQ  DD: 12/16/2007  DT: 12/17/2007  Job #: [id number]

## 2010-05-31 NOTE — Discharge Summary (Signed)
[name], [name]            ACCOUNT NO.:  [id number]   MEDICAL RECORD NO.:  [id number]          PATIENT TYPE:  AMB   LOCATION:  SDS                          FACILITY:  [facility]   PHYSICIAN:  Hillis Range, MD       DATE OF BIRTH:  [birth year]   DATE OF ADMISSION:  08/25/2008  DATE OF DISCHARGE:  08/25/2008                               DISCHARGE SUMMARY   This patient has no known drug allergies.   Time for this dictation which includes entering all his medications and  then checking it out again greater than 40 minutes.  He is a patient of  Dr. Hillis Range, once again as I had mentioned.   FINAL DIAGNOSIS:  1. Persistent atrial fibrillation with planned atrial fibrillation      ablation.  The procedure cancelled due to supratherapeutic INR of      3.5.   SECONDARY DIAGNOSES:  1. History of atrial fibrillation diagnosed 2000.      a.     Symptoms are weakness, short of breath, and heart skipping.      b.     This patient has previously failed Tikosyn/failed       flecainide/failed Rythmol.      c.     The patient is currently on amiodarone 400 mg daily.      d.     Most recently July 16, 2008, direct current cardioversion on       amiodarone with breakthrough of atrial fibrillation.  2. The patient has had direct current cardioversion x2 on flecainide      in the past and also direct current cardioversion x1 on Rythmol in      the past.  3. History of atrial arrhythmias, also includes atrial flutter with      ablation in the late 1990s.  4. Hypertension.  5. Diabetes.  6. Right total knee arthroplasty.   No procedures this admission.   BRIEF HISTORY:  [name] is a 65 year old male.  He has a history of  atrial arrhythmias.  He had atrial flutter ablation in the late 1990s.  He has had atrial fibrillation since 2000.  He originally was treated  with flecainide and had initial success with this, but failed it even  after cardioversion x2, then he was treated with  Rythmol and broke  through on that and finally Tikosyn very lately started on this in May,  but also failed that.  He was then started on amiodarone after his  suitable induction.  The patient had DC cardioversion on July 16, 2008,  he had breakthrough with that.  He has been referred to Dr. Hillis Range  for atrial fibrillation ablation.   HOSPITAL COURSE:  The patient presents electively on August 25, 2008 for  atrial fibrillation ablation.  His Coumadin was supratherapeutic.  The  patient was discharging without the procedure being done.  His Coumadin  dose will be adjusted and he will have followup at the [facility]  [facility], Thursday, August 27, 2008 at 3:30 p.m.   MEDICATIONS AT DISCHARGE:  1. A new medication, Coumadin  1 mg tablets 2 tabs by mouth daily.  He      is to hold off on taking any Coumadin, Tuesday, August 25, 2008 and      to start 2 mg tablets, Wednesday, August 26, 2008.   His other medications are as follows:  1. Actos 45 mg 1 tab daily.  2. Amiodarone 200 mg tablets 2 tablets daily.  3. Atenolol 50 mg 1 tab daily.  4. Glimepiride 2 mg 1/2 tab daily.  5. Hydrochlorothiazide 25 mg 1 tab daily.  6. Metformin 1000 mg 1 tab twice daily.  7. Multivitamin 2 tablets daily.  8. Quinapril 40 mg daily.  9. TriCor 145 mg daily.  10.He is asked to stop taking Coumadin 5 mg half a tablet by mouth      daily.   Once again the lab of importance is INR 3.5.      Maple Mirza, [place]      Hillis Range, MD  Electronically Signed    GM/MEDQ  D:  08/25/2008  T:  08/26/2008  Job:  [id number]   cc:   Titus Dubin. [name], MD,FACP,FCCP

## 2010-05-31 NOTE — Assessment & Plan Note (Signed)
[facility]                                 ON-CALL NOTE   KELDON, LASSEN                   MRN:          [id number]  DATE:05/02/2008                            DOB:          October 21, 1945    HISTORY:  Mr. Murnane is a 65 year-old man with history of persistent  atrial fibrillation.  He is on Coumadin and flecainide 150 mg b.i.d. and  atenolol 50 mg daily.  The patient has undergone cardioversion on a  couple of occasions in the last several weeks.  He began to feel an  irregular pulse last night. He called the answering service today  thinking that he could have his medications changed.  He denies any  chest pain or shortness of breath.  He feels some fatigue but denies  syncope or near syncope.   PLAN:  I spoke to Dr. [name] today.  He has left a message for the EP  nurse to contact the patient early next week so that he can be brought  in for followup appointment to discuss further medication adjustments.  I had explained to the patient that our office would contact him for  earlier followup.  He has been instructed to come to the emergency room  should he begin to feel poorly.   DISPOSITION:  As noted, the patient will be contacted early next week  for earlier followup to bring him in to discuss further medication  adjustments.  Of note, his phone number is [phone].  He will be out of  town for the next couple of days and his cell phone number is [phone] and he can be reached at this number.      Tereso Newcomer, PA-C  Electronically Signed      [name], MD, [facility]  Electronically Signed   SW/MedQ  DD: 05/02/2008  DT: 05/02/2008  Job #: [id number]

## 2010-05-31 NOTE — Discharge Summary (Signed)
NAMESHJON, LIZARRAGA NO.:  [id number]   MEDICAL RECORD NO.:  [id number]          PATIENT TYPE:  OIB   LOCATION:  NA                           FACILITY:  [facility]   PHYSICIAN:  Pricilla Riffle, MD, FACCDATE OF BIRTH:  [birth year]   DATE OF ADMISSION:  DATE OF DISCHARGE:                               DISCHARGE SUMMARY   IDENTIFICATION:  [name] is a 65 year old gentleman with atrial  fibrillation.  He failed flecainide therapy.  He is now on propafenone.  Plan for cardioversion.   The patient anesthetized per Anesthesia with 160 mg propofol.   With the pads in the AP position, the patient was cardioverted to sinus  rhythm with 150 joules synchronized biphasic energy.  Procedure without  complications.  A 12-lead EKG pending.      Pricilla Riffle, MD, [facility]  Electronically Signed     PVR/MEDQ  D:  05/15/2008  T:  05/16/2008  Job:  [id number]

## 2010-05-31 NOTE — Op Note (Signed)
[name], [name]:  [id number]   MEDICAL RECORD NO.:  [id number]          PATIENT TYPE:  OIB   LOCATION:  2899                         FACILITY:  [facility]   PHYSICIAN:  [name], MD, FACCDATE OF BIRTH:  27-Jun-1945   DATE OF PROCEDURE:  07/16/2008  DATE OF DISCHARGE:                               OPERATIVE REPORT   PREOPERATIVE DIAGNOSIS:  Atrial fibrillation.   POSTOPERATIVE DIAGNOSIS:  Sinus rhythm.   PROCEDURES:  Direct current cardioversion.   The patient was submitted to anesthesia with the help of Dr. Katrinka Blazing.  He  received 100 mg of propofol.  A 120-joule shock was delivered  synchronously into atrial fibrillation, terminating atrial fibrillation  and restoring sinus rhythm.  This occurred in the context of amiodarone  therapy now at 400 mg a day.   He will be followed up Dr. Johney Frame in 2 week's time to consider catheter  ablation.      [name], MD, [facility]  Electronically Signed     SCK/MEDQ  D:  07/16/2008  T:  07/17/2008  Job:  [id number]

## 2010-05-31 NOTE — Assessment & Plan Note (Signed)
[facility]                         ELECTROPHYSIOLOGY OFFICE NOTE   GLEASON, ARDOIN                   MRN:          [id number]  DATE:09/18/2006                            DOB:          20-Mar-1945    Mr. 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  The patient notes that he has not  received his allergy shots lately and has had what he described as an  increased sensation of his allergies acting up.  He had no specific  other complaints today, having denied chest pain or shortness of breath.   PAST MEDICAL HISTORY:  Notable for atrial fibrillation and recently  underwent DC cardioversion.  He takes Multimedia programmer and he also has a history  of hypertension and has been on a combination of Accupril, beta blockers  (atenolol 50 mg a day) and hydrochlorothiazide.   PHYSICAL EXAMINATION:  A pleasant, well-appearing middle-aged man in no  acute distress.  The blood pressure was 122/72, the pulse was 66 and regular,  respirations were 18.  The weight was 209 pounds.  NECK:  No jugular venous distention.  LUNGS:  Clear bilaterally to auscultation.  There were no wheezes, rales  or rhonchi.  EXTREMITIES:  No edema.   The EKG today demonstrates sinus rhythm with right bundle branch block.   IMPRESSION:  1. Paroxysmal atrial fibrillation, maintaining sinus rhythm.  2. Wheezing and sensation of dyspnea.   DISCUSSION:  The etiology of these symptoms is unclear to me.  He is  maintaining a sinus rhythm very nicely.  There is no heart failure on  examination, and he is not actively wheezing when I examined the  patient.  He could have some postnasal drip.  His chest x-ray was  obtained today, which demonstrated no obvious active infiltrates.  He  has been  otherwise maintained on Coumadin as directed.  I have  recommended a period of watchful waiting.  I have rescheduled him to  follow up with Dr. [name] in several weeks.     Doylene Canning. Ladona Ridgel, MD  Electronically Signed    GWT/MedQ  DD: 09/18/2006  DT: 09/19/2006  Job #: [id number]

## 2010-05-31 NOTE — Assessment & Plan Note (Signed)
[facility]                            CARDIOLOGY OFFICE NOTE   [name], [name]                   MRN:          [id number]  DATE:09/07/2006                            DOB:          08/20/1945    [name] is a long-term patient of [name].  He was seen as an add  on today as doc of the day.  He has a history of atrial fibrillation  that dates back quite a few years, possibly as far as 2002 and 2003.  He  has been on Coumadin and flecainide.   He has been cardioverted twice but has not had significant problems in  the last 3 years or so.  Four to five weeks ago he had right hip surgery  by [name], this has gone well.  However, he had noticed increasing  palpitations after his surgery.  I do not have his hospital record, but  he said that there was no documented a-fib.  Over the last 3 days, and  particularly last night, he has had constant palpitations and  irregularities.  He thought he was back in atrial fibrillation, and,  indeed, he is.  His rate control has been somewhat suboptimal.   I had a long discussion with [name] today.  He was taking Tambocor 150  in the morning and 100 at night.  He inquired about possibly having a  problem with generic versus brand name.  I told him I would be happy to  increase his flecainide to 150 twice a day, and write a prescription for  brand name Tambocor.   I also thought since he is a diabetic and his rate control is somewhat  suboptimal on atenolol, that we would stop this and start Coreg 25  b.i.d.  There is less insulin resistance with Coreg, and b.i.d. dosing  may settle out his palpitations better.   He already has an appointment next week to see [name].  At that time  he can make a decision regarding timing of cardioversion and/or  switching antiarrhythmics.   [name] Protimes have been watched closely by [name] at home.  We  will try to get those records.  He has not had them  followed here in the  office by [name].  I believe regularly they are followed by the  health care staff at [facility].   His review of systems, otherwise, negative.  In particular, he has not  had any significant TIAs, no chest pain, no PND or orthopnea.   The patient's rehab has been going well.  The patient has no documented  coronary artery disease.  He had a normal cath, I believe, in 2005, and  has had multiple normal stress tests, most recently in October 18 of  2007.   MEDICATIONS:  1. Accupril 40 a day.  2. Atenolol 50 a day to be changed to Coreg 25 b.i.d.  3. Metformin 1 g b.i.d.  4. Coumadin as directed.  5. Tambocor 150 b.i.d., previously 150 in the morning and 100 at      night.  6. Hydrochlorothiazide 25 a  day.  7. Tricor 140 a day.  8. Actos, dose not documented.   PHYSICAL EXAMINATION:  Remarkable for blood pressure of 126/80, pulse is  94 and irregular, he is afebrile.  Respiratory rate is 16.  His skin is well tanned.  Affect is appropriate.  HEENT:  Normal.  Carotids normal without bruit.  There is no  lymphadenopathy, no thyromegaly, no JVP elevation.  LUNGS:  Clear with good diaphragmatic motion, no wheezing.  There is an S1, S2 with normal heart sounds.  ABDOMEN:  Benign, bowel sounds positive, no triple A, no  hepatosplenomegaly, hepatojugular reflux.  He is status post recent right hip surgery.  Distal pulses are intact  with no edema.  NEURO:  Nonfocal.  There is no lymphadenopathy, no muscular weakness.   His EKG shows atrial fibrillation at a rate of 103.  QT interval is 382,  corrected at 500.   IMPRESSION:  1. Recurrent a-fib 4 weeks post hip surgery.  Check Coumadin records.      Unfortunately, I suspect his INR has been subtherapeutic over the      last 3 weeks.  The patient will have his Tambocor increased to 150      b.i.d. and he wants brand name.  2. In regards to rate control we will stop his atenolol and place him      on Coreg 25  b.i.d.  3. No history of coronary artery disease, QT interval relatively      normal.  The patient, I do not think, needs a workup for coronary      disease in case [name] wants to change his antiarrhythmics.   He will follow up with [name] next week at which point timing of  cardioversion and/or switching of antiarrhythmics will be finalized.     Jeffrey Pick. Eden Emms, MD, [facility]  Electronically Signed    PCN/MedQ  DD: 09/07/2006  DT: 09/08/2006  Job #: [id number]   cc:   Jeffrey Salvia, MD, [facility]

## 2010-06-03 NOTE — Op Note (Signed)
[place]  Patient:    [name], [name]                   MRN: [id number] Proc. Date: 03/29/00 Adm. Date:  [id number] Attending:  Nathen May CC:         Titus Dubin. [name], M.D. [facility]   Operative Report  PREOPERATIVE DIAGNOSIS:  Atrial fibrillation.  POSTOPERATIVE DIAGNOSIS:  Atrial fibrillation.  PROCEDURE:  Direct current cardioversion x 4.  DESCRIPTION OF PROCEDURE:  The patient was brought to the electrophysiology laboratory and sedated with a combination of Versed and fentanyl.  He received four shocks in sequence, each with the restoration of sinus rhythm; however, each was _____ by early recurrence of atrial fibrillation.  The patient was then aroused, is fully mobile, fully active, and will be considered for adjuvant _____ cardioversion. DD:  03/29/00 TD:  03/29/00 Job: [id number]

## 2010-06-03 NOTE — Op Note (Signed)
[name], [name]                      ACCOUNT NO.:  [id number]   MEDICAL RECORD NO.:  [id number]                   PATIENT TYPE:  AMB   LOCATION:  NESC                                 FACILITY:  [facility]   PHYSICIAN:  Sigmund I. Patsi Sears, M.D.         DATE OF BIRTH:  06/26/1945   DATE OF PROCEDURE:  09/11/2002  DATE OF DISCHARGE:                                 OPERATIVE REPORT   PREOPERATIVE DIAGNOSIS:  Right ureterovesical junction stone with gross  hematuria.   POSTOPERATIVE DIAGNOSIS:  Right ureterovesical junction stone with gross  hematuria.   OPERATION:  Cystourethroscopy, right retrograde pyelogram, right  ureteroscopy, basket extraction of impacted right ureteral stone and  insertion of 6 x 26 right double J catheter.   SURGEON:  Sigmund I. Patsi Sears, M.D.   ANESTHESIA:  General (LMA).   PREPARATION:  After appropriate preanesthesia, the patient was brought to  the operating room, placed on the operating table in dorsal supine position  where general LMA anesthesia was introduced. He was then replaced in the  dorsal lithotomy position where the pubis was prepped with Betadine solution  and draped in the usual fashion.   HISTORY:  Mr. Strauch is a 65 year old married male, who has a history of  recurrent gross hematuria with CT scan showing a 4 mm x 6 mm right  ureteropelvic junction stone, as well as three left renal stones with no  evidence of hydronephrosis. He did have hydronephrosis on the right side. He  had attempted lithotripsy of the stone, but this was impossible, despite IVP  contrast, because the stone could not be seen. Therefore he now presents for  left retrograde pyelogram, ureteroscopy and basket extraction.   DESCRIPTION OF PROCEDURE:  With the patient in the dorsal lithotomy  position, general LMA anesthesia was introduced. The penis and pubis were  prepped with Betadine solution and draped in the usual fashion. The  cystoscope was  placed in the bladder, and the right ureteral orifice  appeared to be edematous and reactive. Retrograde pyelogram did not reveal a  definite stone, although there was some hydronephrosis noted down to the  ureterovesical junction.   A guidewire was passed in the renal pelvis with difficulty, and the original  guidewire was replaced with a slick wire. Following this, the short  ureteroscope was passed into the lower ureter, and the stone was identified,  measuring 4 x 6, anchored and embedded in the lateral portion of the  ureteral wall, in the area of the right ureterovesical junction tunnel. This  was basket extracted after several attempts, and broken into two pieces. The  large fragment was basket extracted, and then a second ureteroscopy was  accomplished with a small portion extracted. Because of the manipulation  within the ureter, it was elected to pass a double J catheter, and this was  accomplished with a 6 x 26, coiled in the renal pelvis, and in the bladder.  The patient  tolerated the procedure well. He was given 30 mg of IV Toradol  as well as 30 mg of IM Toradol, awakened and taken to the recovery room in  good condition.                                               Sigmund I. Patsi Sears, M.D.    SIT/MEDQ  D:  09/11/2002  T:  09/11/2002  Job:  [id number]   cc:   Dr. ________________ [name]

## 2010-06-03 NOTE — Op Note (Signed)
[place]. [facility]  Patient:    [name], [name]                   MRN: [id number] Proc. Date: 04/10/00 Adm. Date:  [id number] Disc. Date: [id number] Attending:  Nathen May CC:         Electrophysiology Laboratory   Operative Report  PREOPERATIVE DIAGNOSIS:  Atrial fibrillation.  POSTOPERATIVE DIAGNOSIS:  Sinus rhythm.  DESCRIPTION OF PROCEDURE:  The patient was submitted to general anesthesia by Dr. Randa Evens, receiving 50 mg of Propofol.  A 150-joule shock delivered synchronously, and a biphasic wave form terminated fibrillation and restored sinus rhythm.  The patients INR was 3.6, and potassium was greater than 4. DD:  04/10/00 TD:  04/11/00 Job: [id number]

## 2010-06-03 NOTE — Assessment & Plan Note (Signed)
[name]                           ELECTROPHYSIOLOGY OFFICE NOTE   [name], [name]                   MRN:          [id number]  DATE:10/26/2005                            DOB:          1945/05/02    REFERRING PHYSICIAN:  [name], MD, [facility]   [name] was seen.  He had atrial fibrillation in the context of  diabetes and hypertension.  He has been able to lose some weight.  He has  been down about 20 pounds and is up about 5 or 8 now.  His blood pressure is  doing much better at 118/75.   He recently has had some exertional chest discomfort associated with  dyspnea.  He has undergone a stress test a couple of times, mostly recently  in 2000, and underwent catheterization in 2005 because of chest pain with  typical and atypical features.  At that time, he had only a 20% lesion in  his obtuse marginal, otherwise his vessels looked pretty good, per Dr.  Gerri Spore.   On examination today, his blood pressure is as noted.  His lungs are clear.  Heart sounds were regular.  Extremities were without edema.   Electrocardiogram today demonstrated a sinus rhythm at 66 with intervals of  0.176, 0.116, 0.42 with axis of 40 degrees.  There is a R prime in lead V1.   IMPRESSION:  1. Atrial fibrillation.  2. Diabetes.  3. Hypertension.  4. Chest pain with primarily typical features.  5. Obesity.  6. Hip pain, may need hip replacements.   [name] will be submitted for stress testing because of his chest pain  and his multiple cardiac risk factors.  After that, he is going to be  getting back on to an exercise program to hopefully augment his weight loss.   We will see him again in 1 year's time.            ______________________________  [name], MD, [facility]     SCK/MedQ  DD:  10/26/2005  DT:  10/28/2005  Job #:  [id number]   cc:   Titus Dubin. [name], MD,FACP,FCCP

## 2010-06-03 NOTE — Cardiovascular Report (Signed)
NAMEJOHNNY, GORTER                      ACCOUNT NO.:  [id number]   MEDICAL RECORD NO.:  [id number]                   PATIENT TYPE:  OIB   LOCATION:  6501                                 FACILITY:  [facility]   PHYSICIAN:  Carole Binning, M.D. [facility]         DATE OF BIRTH:  April 24, 1945   DATE OF PROCEDURE:  05/28/2003  DATE OF DISCHARGE:  05/28/2003                              CARDIAC CATHETERIZATION   PROCEDURE PERFORMED:  Left heart catheterization with coronary angiography  and left ventriculography.   INDICATION:  Mr. Moise is a 65 year old male with history of diabetes and  atrial fibrillation.  He has been experiencing symptoms of somewhat atypical  chest pain.  He is being managed on flecainide for his atrial fibrillation.  Because of the use of a Class 1c antiarrhythmic and the presence of chest  pain, he was referred by Dr. [name] for cardiac catheterization to rule out  coronary artery disease.   CATHETERIZATION PROCEDURAL NOTE:  A 4 French sheath was placed in the right  femoral artery.  Coronary angiography was performed with standard Judkins 4  French catheters.  Left ventriculography was performed with an angled  pigtail catheter.  Contrast was Omnipaque.  There were no complications.   CATHETERIZATION RESULTS:   HEMODYNAMICS:  1. Left ventricular pressure 160/29.  2. Aortic pressure 164/92.  3. There is no aortic valve gradient.   LEFT VENTRICULOGRAM:  Wall motion is normal.  Ejection fraction is estimated  at 55-60%.  There is no mitral regurgitation.   CORONARY ARTERIOGRAPHY (RIGHT DOMINANT):  Left main is normal.   Left anterior descending artery gives rise to a small first diagonal and a  normal size second diagonal.  The LAD is normal.   Left circumflex gives rise to a small bifurcating ramus intermedius, normal  size first obtuse marginal branch and a normal size second obtuse marginal  branch.  There is a 20% stenosis in the distal left circumflex.  Otherwise,  this vessel is normal.   Right coronary artery is a dominant vessel.  It gives rise to a normal size  posterior descending artery, small first posterior lateral branch and a  small second posterior lateral branch.   IMPRESSION:  1. Normal left ventricular systolic function with an elevated end-diastolic     pressure consistent with possible diastolic dysfunction.  2. No significant coronary artery disease identified.                                               Carole Binning, M.D. [facility]    MWP/MEDQ  D:  05/28/2003  T:  05/29/2003  Job:  [id number]   cc:   [name], M.D.   Titus Dubin. [name], M.D. [facility]

## 2010-06-03 NOTE — Discharge Summary (Signed)
[name], [name]            ACCOUNT NO.:  [id number]   MEDICAL RECORD NO.:  [id number]          PATIENT TYPE:  INP   LOCATION:  1618                         FACILITY:  [facility]   PHYSICIAN:  Madlyn Frankel. Charlann Boxer, M.D.  DATE OF BIRTH:  10-04-1945   DATE OF ADMISSION:  08/07/2006  DATE OF DISCHARGE:  08/10/2006                               DISCHARGE SUMMARY   ADMITTING DIAGNOSES:  1. Osteoarthritis.  2. Hypertension.  3. Atrial fibrillation.  4. Diabetes type 2.  5. Degenerative disk disease.   DISCHARGE DIAGNOSES:  1. Osteoarthritis.  2. Hypertension.  3. Atrial fibrillation.  4. Diabetes type 2.  5. Degenerative disk disease.   HISTORY OF PRESENT ILLNESS:  A 65 year old male with a history  persistent progressive right hip groin pain.  Refractory to all  conservative treatment.  Presurgical assessed by Dr. [name].   CONSULTATION:  1. Pharmacy for Coumadin administration.  2. Procedure was right total hip replacement by surgeon Dr. Durene Romans.  Assistant Dwyane Luo.  Components were metal on metal.   LABORATORY DATA:  Labs preadmission hematocrit 39.1.  Postop day #1  34.6.  Postop day #2 8.7, stable.  Coags preop 1.1, day of surgery 1.2.  Postop day #1 2, postop day #3 2.2 and stable and therapeutic.  Routine  chemistry:  Glucose preadmission 121, BUN 29.  On postop day #1 glucose  113, BUN 26, postop day #2 glucose down to 79, BUN 27, creatinine 1.53  with a bolus checked later that day, glucose 104, BUN 18, creatinine  1.18.  Kidney perfusion on the 24th.  GFR went down to 47 and calculated  at 57.  His calcium was 8.3 at discharge.  GFR greater than 60, calcium  up to 8.4.  GI workup showed his alk-phos at 30.  All others normal.  UA  preoperative had some small leukocyte esterase, WBCs 3-6 was treated per  UTI.   Cardiology EKG showed sinus rhythm with prolonged QRS, broad R in V1-V2.   RADIOLOGY:  Chest two-view.   IMPRESSION:  No evidence of acute  cardiopulmonary disease.  Portable  pelvis one-view good position alignment post right total hip  arthroplasty.   HOSPITAL COURSE:  The patient underwent right total hip replacement,  tolerated procedure well, was admitted to orthopedic floor.  He remained  afebrile throughout his course of stay.  Neurovascularly intact.  The  right lower extremity throughout.  His dressing was changed after postop  #1 on a daily basis with no significant drainage noted.  PT/OT was begun  on postop day #1, weightbearing as tolerated, progressed nicely during  his course of stay was able to ambulate at least 50 feet independently  before discharge.  DVT prophylaxis.  Coumadin was started as well.  He  reached therapeutic levels after postop day #3 at 2.2.  He did have some  muscle spasm while in the hospital, but these subsided significantly  with the administration of Robaxin and muscle relaxer.  He did have mild  difficulty urinating but that resolved before his discharge.  He was  administered  500 mL bolus due to some dehydration and decreased renal  perfusion.  This resolved before discharge.  After three days he was  stable.  All medical issues were resolved.  He was therapeutic with his  DVT prophylaxis.   DISCHARGE DISPOSITION:  Discharged home with home health care PT and  Coumadin measurement in stable and improved condition.   DISCHARGE PHYSICAL THERAPY:  Work on gait retraining by proprioception.  He is weightbearing as tolerated with the use of a rolling walker for  two weeks and single point cane.  Want to minimize pain, maximize  strength, increase range of motion.   DISCHARGE INSTRUCTIONS:  1. Diet:  No restrictions.  2. Discharge wound care:  Keep dry.   DISCHARGE FOLLOWUP:  Follow up with Dr. Charlann Boxer [phone] in 2-3 weeks.   DISCHARGE MEDICATIONS:  1. Coumadin.  2. Robaxin 500 mg p.o. q.6 h.  3. Iron 325 mg p.o. t.i.d. x3 weeks.  4. Colace 100 mg p.o. b.i.d.  5. MiraLax 17 grams  daily.  6. Vicodin 5/325 1-2 p.o. q. 4-6 p.r.n. pain.  7. Actos 45 mg one p.o. q.h.s. after supper.  8. Metformin 1000  mg tabs one b.i.d.  9. Tricor 45 mg one before breakfast.  10.Glimepiride 2 mg tab 1/2 tablet q.a.m.  11.Allergy shot twice a month.  12.HCTZ 25 mg one p.o. q.a.m.  13.Atenolol 50 mg one p.o. q.a.m.  14.Flecainide 100 mg 1-1/2 tablets in the a.m., one tablet in the      evening.  15.Quinapril 40 mg tabs 1/2 tablet q.a.m.  16.Coumadin per previous therapeutic levels which were 5 mg on Monday,      Wednesday, Friday, and 2-1/2 mg on Tuesday, Thursday, Saturday,      Sunday.   DISCHARGE SPECIAL INSTRUCTIONS:  If the patient develops acute shortness  of breath, severe calf pain, call emergency service immediately.     ______________________________  Yetta Glassman [address], [place]      Madlyn Frankel. Charlann Boxer, M.D.  Electronically Signed    BLM/MEDQ  D:  08/20/2006  T:  08/20/2006  Job:  [id number]   cc:   Titus Dubin. [name], MD,FACP,FCCP  [phone] [place]  [place] [address]

## 2010-06-03 NOTE — Procedures (Signed)
[place]  Patient:    [name], [name]                   MRN: [id number] Proc. Date: 04/10/00 Adm. Date:  [id number] Disc. Date: [id number] Attending:  Nathen May                           Procedure Report  PREOPERATIVE DIAGNOSIS:  Sinus rhythm, on flecainide therapy.  POSTOPERATIVE DIAGNOSIS:  Sinus rhythm, on flecainide therapy  PROCEDURE:  Treadmill testing for proarrhythmia assessment.  DESCRIPTION OF PROCEDURE:  Mr. Mendiola was submitted to a modification of a standard Bruce protocol.  He exercised for a total of 3 minutes 53 seconds, which included 53 seconds into stage 3 on this protocol.  There was no evidence of exercise-associated ventricular arrhythmias.  The patient did have some symptomatic PACs.  IMPRESSION:  Stable on his flecainide therapy.  Will follow up as scheduled. DD:  04/10/00 TD:  04/11/00 Job: [id number]

## 2010-06-08 ENCOUNTER — Encounter: Payer: Self-pay | Admitting: Internal Medicine

## 2010-06-09 ENCOUNTER — Encounter: Payer: Self-pay | Admitting: Internal Medicine

## 2010-06-09 ENCOUNTER — Ambulatory Visit (INDEPENDENT_AMBULATORY_CARE_PROVIDER_SITE_OTHER): Payer: Managed Care, Other (non HMO) | Admitting: Internal Medicine

## 2010-06-09 DIAGNOSIS — I4891 Unspecified atrial fibrillation: Secondary | ICD-10-CM

## 2010-06-09 DIAGNOSIS — Z7901 Long term (current) use of anticoagulants: Secondary | ICD-10-CM

## 2010-06-09 DIAGNOSIS — G479 Sleep disorder, unspecified: Secondary | ICD-10-CM

## 2010-06-09 DIAGNOSIS — N259 Disorder resulting from impaired renal tubular function, unspecified: Secondary | ICD-10-CM

## 2010-06-09 DIAGNOSIS — E119 Type 2 diabetes mellitus without complications: Secondary | ICD-10-CM

## 2010-06-09 DIAGNOSIS — M169 Osteoarthritis of hip, unspecified: Secondary | ICD-10-CM

## 2010-06-09 DIAGNOSIS — I10 Essential (primary) hypertension: Secondary | ICD-10-CM

## 2010-06-09 LAB — POCT INR: INR: 1.8

## 2010-06-09 MED ORDER — HYDROCHLOROTHIAZIDE 25 MG PO TABS: ORAL_TABLET | ORAL | Status: DC

## 2010-06-09 MED ORDER — LEVOTHYROXINE SODIUM 25 MCG PO TABS: 25.0000 ug | ORAL_TABLET | Freq: Every day | ORAL | Status: DC

## 2010-06-09 NOTE — Patient Instructions (Addendum)
Please verify whether you're having any apnea. This should be evaluated if there's a question of apnea prior to the hip revision. Arrangements will made to the Coumadin clinic for heparin bridging perioperatively. Coumadin ordered DAW due to labile PT/INR with warfarin.

## 2010-06-09 NOTE — Progress Notes (Signed)
Subjective:    Patient ID: [name], male    DOB: Dec 04, 1945, 65 y.o.   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 He's also had some discomfort in this area an MRI revealed some effusion.  He is on Coumadin for recurrent atrial fibrillation which failed to respond to ablation on 3 occasions. For the hip revision he'll need to be a transition to heparin per the Coumadin clinic    Review of Systems He does note palpitations and shortness of breath related to the atrial fibrillation. He denies any anginal type pain or  edema. He denies abdominal pain, melena, rectal bleeding, hematuria, pyuria or dysuria. Diabetes status assessment : Fasting or morning glucose range: 100-114 or average : 110 . Highest glucose 2 hours after any meal :not checked . Hypoglycemia : no  . ROS: Excess thirst /  hunger  / urination:  no.  Lightheadedness with standing: no . Pain in  calves with walking : no . Non healing skin  ulcers or sores : no.  Numbness , tingling or burning in feet :no .                                                                                                                                            Significant change in  Weight : no .  Vision changes:  no  .  Exercise : no.  Nutrition/diet : no specific diet.                                                                                                                                                Medication compliance:  no. Adverse  Medication effects:  no . Eye exam : 12 months ago.  Foot care:  12/11 . A1c/ urine microalbumin monitor:  A1c 6.5% & microalb 3.2 in 03/12 .       Objective:   Physical Exam  Gen.: Healthy and well-nourished in appearance. Alert, appropriate and cooperative throughout exam. Head: Normocephalic without obvious abnormalities Eyes: No corneal or conjunctival inflammation noted. Pupils  equal round reactive to light and accommodation.  Extraocular motion intact. Vision  grossly normal. Nose: External nasal exam reveals no deformity or inflammation. Nasal mucosa are pink and moist. No lesions or exudates noted. Septum  w/o deviation.  Mouth: Oral mucosa and oropharynx reveal no lesions or exudates. Teeth in good repair.Some oropharyngeal crowding Neck: No deformities, masses, or tenderness noted. Range of motion &. Thyroid normal. Lungs: Normal respiratory effort; chest expands symmetrically. Lungs are clear to auscultation without rales, wheezes, or increased work of breathing. Heart: Normal rate and rhythm. Normal S1 and S2. No gallop, click, or rub. S4 w/o murmur. Abdomen: Bowel sounds normal; abdomen soft and nontender. No masses, organomegaly or hernias noted. Musculoskeletal/extremities: No deformity or scoliosis noted of  the thoracic or lumbar spine. No clubbing, cyanosis, edema, or deformity noted. Range of motion  Normal but pain @ R hip  .Tone & strength  normal.Joints normal. Nail health  good. Vascular: Carotid, radial artery, dorsalis pedis and dorsalis posterior tibial pulses are full and equal. No bruits present. Neurologic: Alert and oriented x3. Deep tendon reflexes symmetrical and normal. Light touch normal over feet. Skin: Intact without suspicious lesions or rashes. Lymph: No cervical, axillary, or inguinal lymphadenopathy present. Psych: Mood and affect are normal. 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 This is associated with increased chromium cobalt levels and will require revision  #2 diabetes; adequate control  #3 hypertension well controlled  #4 atrial fibrillation; chronic Coumadin therapy. Rate is adequately controlled  #5 the question of possible sleep apnea has been raised as a factor in  his chronic atrial fibrillation  Plan:I have asked him to discuss whether his wife is observed any apnea. If this is present its this question should be addressed prior to surgery.  There is no contraindication to the planned surgery. He will require heparin bridging perioperatively  trough  the Coumadin clinic for optimal safety.

## 2010-06-09 NOTE — Assessment & Plan Note (Signed)
AF possibly related to Sleep Apnea ;evaluation  discussed @ [facility], not completed

## 2010-06-14 ENCOUNTER — Telehealth: Payer: Self-pay

## 2010-06-14 ENCOUNTER — Other Ambulatory Visit: Payer: Self-pay | Admitting: Internal Medicine

## 2010-06-14 MED ORDER — ENOXAPARIN SODIUM 100 MG/ML ~~LOC~~ SOLN: 100.0000 mg | Freq: Two times a day (BID) | SUBCUTANEOUS | Status: DC

## 2010-06-14 NOTE — Telephone Encounter (Signed)
Patient states he already increased his weekly warfarin by one half of a 5mg  one day a week, patient made change after last OV,( M/F 5 mg and all other days 2.5 mg). Patient made Coumadin clinic appointment for Next Thursday

## 2010-06-14 NOTE — Telephone Encounter (Signed)
Message copied by Edgardo Roys on Tue Jun 14, 2010  5:11 PM ------      Message from: Pecola Lawless      Created: Tue Jun 14, 2010  6:48 AM       The PT/INR is slightly low. If there is going to be a delay in getting to the Coumadin clinic to arrange the heparin, he should increase his weekly warfarin dose by one half of a 5 mg pill  one day a week.

## 2010-06-14 NOTE — Telephone Encounter (Signed)
Dr.Hopper Informed

## 2010-06-14 NOTE — Telephone Encounter (Signed)
Called and spoke with patient.  Per surgeon hold coumadin 5 days prior to surgery.  Last dose of coumadin 06/21/10.  Made appointment to come to anticoagulation clinic for Lovenox teaching on 06/23/10.  Patient to bring Lovenox injections to appointment.  Rx sent to pharmacy.  Patient aware.

## 2010-06-15 ENCOUNTER — Other Ambulatory Visit: Payer: Self-pay | Admitting: Internal Medicine

## 2010-06-17 HISTORY — PX: TOTAL HIP ARTHROPLASTY: SHX124

## 2010-06-20 ENCOUNTER — Ambulatory Visit: Payer: Managed Care, Other (non HMO) | Admitting: Internal Medicine

## 2010-06-21 ENCOUNTER — Other Ambulatory Visit: Payer: Self-pay | Admitting: Orthopedic Surgery

## 2010-06-21 ENCOUNTER — Encounter (HOSPITAL_COMMUNITY): Payer: Managed Care, Other (non HMO)

## 2010-06-21 LAB — CBC
HCT: 49.7 % (ref 39.0–52.0)
Hemoglobin: 16.4 g/dL (ref 13.0–17.0)
MCH: 29.6 pg (ref 26.0–34.0)
MCHC: 33 g/dL (ref 30.0–36.0)
MCV: 89.7 fL (ref 78.0–100.0)
Platelets: 188 10*3/uL (ref 150–400)
RBC: 5.54 MIL/uL (ref 4.22–5.81)
RDW: 14.8 % (ref 11.5–15.5)
WBC: 8.3 10*3/uL (ref 4.0–10.5)

## 2010-06-21 LAB — BASIC METABOLIC PANEL
BUN: 19 mg/dL (ref 6–23)
CO2: 29 mEq/L (ref 19–32)
Calcium: 9.5 mg/dL (ref 8.4–10.5)
Chloride: 101 mEq/L (ref 96–112)
Creatinine, Ser: 1.02 mg/dL (ref 0.4–1.5)
GFR calc Af Amer: >60 mL/min (ref >60)
GFR calc non Af Amer: >60 mL/min (ref >60)
Glucose, Bld: 131 mg/dL — ABNORMAL HIGH (ref 70–99)
Potassium: 4.5 mEq/L (ref 3.5–5.1)
Sodium: 139 mEq/L (ref 135–145)

## 2010-06-21 LAB — DIFFERENTIAL
Basophils Absolute: 0.1 10*3/uL (ref 0.0–0.1)
Basophils Relative: 1 % (ref 0–1)
Eosinophils Absolute: 0.7 10*3/uL (ref 0.0–0.7)
Eosinophils Relative: 8 % — ABNORMAL HIGH (ref 0–5)
Lymphocytes Relative: 20 % (ref 12–46)
Lymphs Abs: 1.6 10*3/uL (ref 0.7–4.0)
Monocytes Absolute: 0.6 10*3/uL (ref 0.1–1.0)
Monocytes Relative: 8 % (ref 3–12)
Neutro Abs: 5.2 10*3/uL (ref 1.7–7.7)
Neutrophils Relative %: 63 % (ref 43–77)

## 2010-06-21 LAB — URINALYSIS, ROUTINE W REFLEX MICROSCOPIC
Bilirubin Urine: NEGATIVE
Glucose, UA: NEGATIVE mg/dL
Hgb urine dipstick: NEGATIVE
Ketones, ur: NEGATIVE mg/dL
Leukocytes, UA: NEGATIVE
Nitrite: NEGATIVE
Protein, ur: 100 mg/dL — AB
Specific Gravity, Urine: 1.024 (ref 1.005–1.030)
Urobilinogen, UA: 0.2 mg/dL (ref 0.0–1.0)
pH: 6 (ref 5.0–8.0)

## 2010-06-21 LAB — APTT: aPTT: 68 seconds — ABNORMAL HIGH (ref 24–37)

## 2010-06-21 LAB — SURGICAL PCR SCREEN
MRSA, PCR: NEGATIVE
Staphylococcus aureus: POSITIVE — AB

## 2010-06-21 LAB — PROTIME-INR
INR: 3.2 — ABNORMAL HIGH (ref 0.00–1.49)
Prothrombin Time: 32.8 seconds — ABNORMAL HIGH (ref 11.6–15.2)

## 2010-06-21 LAB — URINE MICROSCOPIC-ADD ON

## 2010-06-23 ENCOUNTER — Ambulatory Visit (INDEPENDENT_AMBULATORY_CARE_PROVIDER_SITE_OTHER): Payer: Managed Care, Other (non HMO) | Admitting: *Deleted

## 2010-06-23 DIAGNOSIS — Z7901 Long term (current) use of anticoagulants: Secondary | ICD-10-CM

## 2010-06-23 DIAGNOSIS — I4891 Unspecified atrial fibrillation: Secondary | ICD-10-CM

## 2010-06-23 LAB — POCT INR: INR: 3.6

## 2010-06-23 NOTE — Patient Instructions (Signed)
Current Coumadin on hold for upcoming procedure.  Begin enoxaparin 100mg  injections on Saturday June 9 morning. Inject 1 syringe every 12hr.  Last injection Sunday morning.  DO NOT  give injection Sunday evening or  Monday morning.  Call Dr. Frederik Pear office at discharge from hospital for follow up Coumadin check.

## 2010-06-27 ENCOUNTER — Inpatient Hospital Stay (HOSPITAL_COMMUNITY): Payer: Managed Care, Other (non HMO)

## 2010-06-27 ENCOUNTER — Other Ambulatory Visit: Payer: Self-pay | Admitting: Orthopedic Surgery

## 2010-06-27 ENCOUNTER — Inpatient Hospital Stay (HOSPITAL_COMMUNITY)
Admission: RE | Admit: 2010-06-27 | Discharge: 2010-06-29 | DRG: 468 | Disposition: A | Discharge status: Home Health | Payer: Managed Care, Other (non HMO) | Source: Ambulatory Visit | Attending: Orthopedic Surgery | Admitting: Orthopedic Surgery

## 2010-06-27 DIAGNOSIS — Y831 Surgical operation with implant of artificial internal device as the cause of abnormal reaction of the patient, or of later complication, without mention of misadventure at the time of the procedure: Secondary | ICD-10-CM | POA: Diagnosis present

## 2010-06-27 DIAGNOSIS — T84099A Other mechanical complication of unspecified internal joint prosthesis, initial encounter: Principal | ICD-10-CM | POA: Diagnosis present

## 2010-06-27 DIAGNOSIS — I4891 Unspecified atrial fibrillation: Secondary | ICD-10-CM | POA: Diagnosis present

## 2010-06-27 DIAGNOSIS — I10 Essential (primary) hypertension: Secondary | ICD-10-CM | POA: Diagnosis present

## 2010-06-27 DIAGNOSIS — E039 Hypothyroidism, unspecified: Secondary | ICD-10-CM | POA: Diagnosis present

## 2010-06-27 DIAGNOSIS — Z7901 Long term (current) use of anticoagulants: Secondary | ICD-10-CM

## 2010-06-27 DIAGNOSIS — E119 Type 2 diabetes mellitus without complications: Secondary | ICD-10-CM | POA: Diagnosis present

## 2010-06-27 DIAGNOSIS — Z01818 Encounter for other preprocedural examination: Secondary | ICD-10-CM

## 2010-06-27 DIAGNOSIS — Z96649 Presence of unspecified artificial hip joint: Secondary | ICD-10-CM

## 2010-06-27 LAB — GLUCOSE, CAPILLARY
Glucose-Capillary: 129 mg/dL — ABNORMAL HIGH (ref 70–99)
Glucose-Capillary: 132 mg/dL — ABNORMAL HIGH (ref 70–99)
Glucose-Capillary: 162 mg/dL — ABNORMAL HIGH (ref 70–99)

## 2010-06-27 LAB — PROTIME-INR
INR: 1.44 (ref 0.00–1.49)
Prothrombin Time: 17.7 seconds — ABNORMAL HIGH (ref 11.6–15.2)

## 2010-06-27 LAB — TYPE AND SCREEN
ABO/RH(D): A POS
Antibody Screen: NEGATIVE

## 2010-06-27 LAB — APTT: aPTT: 44 seconds — ABNORMAL HIGH (ref 24–37)

## 2010-06-28 LAB — BASIC METABOLIC PANEL
BUN: 25 mg/dL — ABNORMAL HIGH (ref 6–23)
CO2: 26 mEq/L (ref 19–32)
Calcium: 8.7 mg/dL (ref 8.4–10.5)
Chloride: 101 mEq/L (ref 96–112)
Creatinine, Ser: 0.98 mg/dL (ref 0.4–1.5)
GFR calc Af Amer: >60 mL/min (ref >60)
GFR calc non Af Amer: >60 mL/min (ref >60)
Glucose, Bld: 190 mg/dL — ABNORMAL HIGH (ref 70–99)
Potassium: 4.9 mEq/L (ref 3.5–5.1)
Sodium: 135 mEq/L (ref 135–145)

## 2010-06-28 LAB — GLUCOSE, CAPILLARY
Glucose-Capillary: 282 mg/dL — ABNORMAL HIGH (ref 70–99)
Glucose-Capillary: 184 mg/dL — ABNORMAL HIGH (ref 70–99)
Glucose-Capillary: 218 mg/dL — ABNORMAL HIGH (ref 70–99)
Glucose-Capillary: 156 mg/dL — ABNORMAL HIGH (ref 70–99)
Glucose-Capillary: 204 mg/dL — ABNORMAL HIGH (ref 70–99)

## 2010-06-28 LAB — CBC
HCT: 42.7 % (ref 39.0–52.0)
Hemoglobin: 14.1 g/dL (ref 13.0–17.0)
MCH: 29.6 pg (ref 26.0–34.0)
MCHC: 33 g/dL (ref 30.0–36.0)
MCV: 89.5 fL (ref 78.0–100.0)
Platelets: 168 10*3/uL (ref 150–400)
RBC: 4.77 MIL/uL (ref 4.22–5.81)
RDW: 14.7 % (ref 11.5–15.5)
WBC: 10.8 10*3/uL — ABNORMAL HIGH (ref 4.0–10.5)

## 2010-06-28 LAB — PROTIME-INR
INR: 1.47 (ref 0.00–1.49)
Prothrombin Time: 18 seconds — ABNORMAL HIGH (ref 11.6–15.2)

## 2010-06-28 NOTE — H&P (Signed)
NAMEIBRAHAM, [name]            ACCOUNT NO.:  [id number]  MEDICAL RECORD NO.:  [id number]  LOCATION:  PADM                         FACILITY:  [facility]  PHYSICIAN:  Madlyn Frankel. Charlann Boxer, M.D.  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  HISTORY OF PRESENT ILLNESS:  This is a 65 year old gentleman with a history of ASR hip 4 years ago on the right with subsequent metallosis and fluid collection who is now scheduled for revision of his right ASR total hip.  The surgery risks, benefits, and aftercare were discussed with the patient.  Questions invited and answered.  Note, he is in AFib and on Coumadin for that.  He will be managed by the Coumadin Clinic in [place] for that in the perioperative period and will go back on his Coumadin postoperatively.  Note that, he is not a candidate for tranexamic acid and will not receive that postop.  DRUG ALLERGIES:  None.  CURRENT MEDICATIONS: 1. Atenolol 100 mg daily. 2. Coumadin, Monday and Friday 5 mg, other days of the week 2.5 mg. 3. Glimepiride 2 mg 1/2 tablet once a day. 4. Hydrochlorothiazide 25 mg once a day. 5. Levoxyl once a day. 6. Quinapril 40 mg once a day. 7. Tradjenta 5 mg once a day.  SERIOUS MEDICAL ILLNESSES: 1. Atrial fibrillation. 2. Hypertension. 3. Diabetes. 4. Hypothyroidism.  PREVIOUS SURGERIES:  Hip replacement on the right and heart ablation x2 for atrial fib.  FAMILY HISTORY:  Positive for stroke, organ failure, and testicular cancer.  SOCIAL HISTORY:  The patient is married.  He does not smoke and drinks rarely.  REVIEW OF SYSTEMS:  CENTRAL NERVOUS SYSTEM:  Negative for headache, blurred vision, or dizziness.  PULMONARY:  Positive for shortness of breath on exertion.  Negative for chest pain or palpitation. CARDIOVASCULAR:  Positive for  atrial fib.  GI:  Positive for constipation.  GU:  Negative.  MUSCULOSKELETAL:  Positive as in HPI.  PHYSICAL EXAMINATION:  VITAL SIGNS:  BP 140/78, respirations 16, pulse 86 and slightly irregular. GENERAL APPEARANCE:  This is a well-developed, well-nourished gentleman in no acute distress. HEENT:  Head normocephalic.  Nose patent.  Ears patent.  Pupils equal, round, and reactive to light.  Throat without injection. NECK:  Supple without adenopathy.  Carotids 2+ without bruit. CHEST:  Clear to auscultation.  No rales or rhonchi.  Respirations 16. HEART:  Generally regular rate and rhythm with occasional irregularity at 88 beats per minute without murmur. ABDOMEN:  Soft with active bowel sounds.  No masses or organomegaly. NEUROLOGIC:  The patient alert and oriented to time, place, and person. Cranial nerves II-XII grossly intact. EXTREMITIES:  Right hip with full extension, further flexion to 115 degrees with a 45-degree arc of motion with discomfort.  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  PLAN:  Revision right ASR hip.     Jaquelyn Bitter. Chabon, P.A.   ______________________________ Madlyn Frankel Charlann Boxer, M.D.    SJC/MEDQ  D:  06/22/2010  T:  06/23/2010  Job:  [id number]  Electronically Signed by Jodene Nam P.A. on 06/27/2010 02:09:49 PM Electronically Signed by Durene Romans M.D. on 06/28/2010 04:27:48 PM

## 2010-06-28 NOTE — Op Note (Signed)
NAMEHERSHEL, [name] NO.:  [id number]  MEDICAL RECORD NO.:  [id number]  LOCATION:  0012                         FACILITY:  [facility]  PHYSICIAN:  Madlyn Frankel. Charlann Boxer, M.D.  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  PROCEDURE: 1. Revision right total hip replacement, removal of acetabular shell     and femoral head and sent to pathology. 2. Placement of a size 56 Gription pinnacle shell single cancellous     screw at 36 +4 10-degree Hi-Wall AltrX liner and a 36 + 8.5 Delta     ceramic ball.  SURGEON:  Madlyn Frankel. Charlann Boxer, M.D.  ASSISTANT:  Jaquelyn Bitter. Chabon, P.A.  ANESTHESIA:  General.  BLOOD LOSS:  250 cc.  DRAINS:  One.  COMPLICATIONS:  None.  SPECIMEN:  I did send the patient's femoral head and acetabular components off for evaluation for attorney purposes.  INDICATIONS FOR PROCEDURE:  [name] is a 65 year old gentleman about 4 years out from right total hip replacement, ASR component, size 54 acetabular shell.  He had developed pain and had progressive increasing metallosis numbers with increasing cobalt and chromium levels.  Given his persistent discomfort and his lab findings, he wished to proceed with more definitive measures in terms of managing this. After reviewing risks and benefit including infection, DVT, component failure, dislocation, revision setting, he wished to proceed in this fashion.  Consent was obtained for benefit of pain relief and metal ion and debris elimination.  PROCEDURE IN DETAIL:  The patient was brought to operative theater. Once adequate anesthesia, preoperative  antibiotics, Ancef administered, the patient was positioned in the left lateral decubitus position with right side up.  The right lower extremity was then prepped and draped in sterile fashion.  Time-out was performed identifying the patient, planned procedure and the extremity.  A lateral incision was made based over the proximal aspect of the trocar utilizing his old incision. Sharp dissection was carried to the iliotibial band and gluteal fascia, which was then incised for posterior approach.  Following initial dissection and exposure of the posterior hip, the hip was dislocated and the femoral head was removed.  At this point, care taken to carefully dissect out the acetabulum on the posterior element from the superior ileum area down to the ischium.  With this exposed, I found that the posterior wall was fairly thin.  I used a 0.25-inch curved osteotome in order to remove or lessen gap between the bone and component interface.  After little bit effort with this, I was able to remove the femoral acetabular component without much bone loss.  There was a 54-mm cup as confirmed.  I then used a 55 reamer once and based on the thinness of the previous preparation bone, I did not want to ream further, chose a 56 Gription pinnacle cup.  On a curved impactor,  this was impacted in and then I felt that it was fairly anatomical, but it may have only had about 15 degrees of forward flexion.  Given the good amount of bone stock, I felt this was appropriate single cancellous screw and it helped to combine to provide a good initial scratch fixation.  I did go ahead and placed a neutral trial liner.  I did a trial reduction with initially +5 ball given the fact that I had +8 adapter in the previous ASR component.  I felt there was an some impingement anterior with forward flexion and internal rotation.  For this reason, I spent time removing some of scarred capsular tissue as well as some bone  what appeared to be more of osteophyte anteriorly.  At this point, I chose to use a 36+4 10-degree Hi-Wall liner, little bit more support and ended up going up to an 8.5 ball.  The trial components were removed.  The final 36 + 4 10-degree Hi-Wall liner was impacted with the Hi-Wall positioned at about 9 to 10 o'clock area for this right hip.  I then trialed with an 8.5 ball and found the hip stability.  Combined anteversion was about 45 degrees.  Hip stability was very good after removing the scar anteriorly, but limiting impingement as well as adding clip.  There was no evidence of any subluxation.  No evidence of impingement, extension, external rotation.  Given these findings, the final 36 +8.5 Delta ceramic ball was impacted on to clean and dry trunnion and the hip reduced.  We had irrigated hip throughout the case.  There was no real capsular tissue based on the synovectomy performed to reapproximate, thus the medium Hemovac drain was placed deep.  I reapproximated the iliotibial band and gluteal fascia using #1 Vicryl. The remainder of the wound was closed with 2-0 Vicryl and running 4-0 Monocryl.  Hip was cleaned, dried, and dressed sterilely using Dermabond and Aquacel dressing and drain site dressed separately.  The patient then brought to recovery room in stable condition, tolerating the procedure well.     Madlyn Frankel Charlann Boxer, M.D.     MDO/MEDQ  D:  06/27/2010  T:  06/27/2010  Job:  [id number]  Electronically Signed by Durene Romans M.D. on 06/28/2010 04:27:45 PM

## 2010-06-29 LAB — BASIC METABOLIC PANEL
BUN: 20 mg/dL (ref 6–23)
CO2: 28 mEq/L (ref 19–32)
Calcium: 8.9 mg/dL (ref 8.4–10.5)
Chloride: 104 mEq/L (ref 96–112)
Creatinine, Ser: 0.84 mg/dL (ref 0.4–1.5)
GFR calc Af Amer: >60 mL/min (ref >60)
GFR calc non Af Amer: >60 mL/min (ref >60)
Glucose, Bld: 139 mg/dL — ABNORMAL HIGH (ref 70–99)
Potassium: 4.7 mEq/L (ref 3.5–5.1)
Sodium: 139 mEq/L (ref 135–145)

## 2010-06-29 LAB — CBC
HCT: 41 % (ref 39.0–52.0)
Hemoglobin: 13.3 g/dL (ref 13.0–17.0)
MCH: 29.4 pg (ref 26.0–34.0)
MCHC: 32.4 g/dL (ref 30.0–36.0)
MCV: 90.5 fL (ref 78.0–100.0)
Platelets: 162 10*3/uL (ref 150–400)
RBC: 4.53 MIL/uL (ref 4.22–5.81)
RDW: 15.2 % (ref 11.5–15.5)
WBC: 10.3 10*3/uL (ref 4.0–10.5)

## 2010-06-29 LAB — PROTIME-INR
INR: 1.87 — ABNORMAL HIGH (ref 0.00–1.49)
Prothrombin Time: 21.7 seconds — ABNORMAL HIGH (ref 11.6–15.2)

## 2010-06-29 LAB — GLUCOSE, CAPILLARY: Glucose-Capillary: 139 mg/dL — ABNORMAL HIGH (ref 70–99)

## 2010-06-30 LAB — GLUCOSE, CAPILLARY: Glucose-Capillary: 123 mg/dL — ABNORMAL HIGH (ref 70–99)

## 2010-07-04 ENCOUNTER — Ambulatory Visit: Payer: Managed Care, Other (non HMO) | Admitting: Internal Medicine

## 2010-07-05 NOTE — Discharge Summary (Signed)
[name], [name] NO.:  [id number]  MEDICAL RECORD NO.:  [id number]  LOCATION:  1614                         FACILITY:  [facility]  PHYSICIAN:  Madlyn Frankel. Charlann Boxer, M.D.  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  DISCHARGE DIAGNOSES: 1. Failed right total hip related to metallosis. 2. History of atrial fibrillation, on Coumadin. 3. Hypertension. 4. Diabetes. 5. Hypothyroidism.  BRIEF HISTORY:  [name] is a pleasant 65 year old gentleman with history of right total hip replacement by myself about 4 years ago with DePuy ASR components.  He has had some concern regarding the recall and metal issues, particularly related to his heart.  Given the persistence of discomfort and workup in the preoperative setting, he wished to have this hip revised.  We discussed revising him to a ceramic-on- polyethylene insert.  Risks of infection, DVT, component failure, dislocation and revision of same were all discussed and reviewed. Consent was obtained.  HOSPITAL COURSE:  The patient admitted to the hospital to same-day surgery on June 27, 2010.  He underwent revision right total hip replacement that was without event.  After routine stay in the recovery room, he was transferred to orthopedic ward where he remained for his hospital stay.  He was started back on Coumadin postoperatively.  He was seen and evaluated by Physical Therapy.  I had him partial weightbearing for 4 to 5 weeks to allow for cup ingrowth based on bone craft available and his bone quality otherwise and muscle tissue otherwise was normal. Postoperatively, his hematocrit remained stable.  On day 2, his hematocrit was still 41 and electrolytes were stable.  His right hip remained dry.  His Hemovac and Foley removed on day 1.  He is  placed on a regular diet.  By postop day #2, he is ready for discharge home.  DISCHARGE CONDITION:  Stable on discharge.  DISCHARGE INSTRUCTIONS:  The patient will be discharged to home with home health physical therapy to work on range of motion, strengthening, gait training.  We will see him back in the office for routine follow up in 2-week time for wound evaluation.  He should be on a diabetic controlled diet at home if he is not already. We will plan to follow up with his lab work in the office as discussed in 3 to 40-month interval.  DISCHARGE MEDICATIONS: 1. Colace 100 mg p.o. b.i.d. as needed for constipation while on pain     medicine. 2. MiraLax 17 g p.o. daily as needed for constipation while on pain     medicine. 3. Robaxin 500 mg every 6 hours as needed for muscle spasm and pain. 4. Norco 7.5/325 one to two tablets every 4 to 6 hours as needed for     pain. 5. Atenolol 100 mg q.a.m. 6. Coumadin as previously directed and can be followed by home health     agency for INR to be maintained between 2 and 3. 7. Glimepiride 1 mg q.a.m. 8. Hydrochlorothiazide 25 mg q.a.m. 9. Levoxyl 25 mcg q.a.m.  10.Multivitamins 2 tablets daily. 11.Quinapril 40 mg q.a.m. 12.Tradjenta 5 mg q.h.s.  He will return to see Dr. Durene Peters at [facility] at office number [phone] in 2 weeks' time.  If any orthopedic questions, he can contact the office.     Madlyn Frankel Charlann Boxer, M.D.     MDO/MEDQ  D:  06/29/2010  T:  06/29/2010  Job:  [id number]  Electronically Signed by [name] M.D. on 07/05/2010 09:09:53 AM

## 2010-08-17 ENCOUNTER — Ambulatory Visit (INDEPENDENT_AMBULATORY_CARE_PROVIDER_SITE_OTHER): Payer: Managed Care, Other (non HMO) | Admitting: *Deleted

## 2010-08-17 ENCOUNTER — Other Ambulatory Visit: Payer: Self-pay | Admitting: *Deleted

## 2010-08-17 DIAGNOSIS — Z7901 Long term (current) use of anticoagulants: Secondary | ICD-10-CM

## 2010-08-17 DIAGNOSIS — I4891 Unspecified atrial fibrillation: Secondary | ICD-10-CM

## 2010-08-17 LAB — POCT INR: INR: 4.4

## 2010-08-17 MED ORDER — GLIMEPIRIDE 2 MG PO TABS: 2.0000 mg | ORAL_TABLET | Freq: Every day | ORAL | Status: DC

## 2010-08-17 NOTE — Patient Instructions (Signed)
Take 2.5 mg today M,T,Th,F,Sat, Sun- 2.5 mg Wed- 5mg  Recheck in 2 weeks.

## 2010-08-22 ENCOUNTER — Telehealth: Payer: Self-pay | Admitting: *Deleted

## 2010-08-22 ENCOUNTER — Telehealth: Payer: Self-pay | Admitting: Internal Medicine

## 2010-08-22 NOTE — Telephone Encounter (Signed)
This is Dr. Allred's patient 

## 2010-08-22 NOTE — Telephone Encounter (Signed)
Per Dr Jarold Motto phone note in EMR patient should have done Ifob cards but he never did, I have spoke to the patient and he will come to our lab in the next week or so and complete the Ifobs within the next 30 days.

## 2010-08-22 NOTE — Telephone Encounter (Signed)
Ok to change to [employer]

## 2010-08-22 NOTE — Telephone Encounter (Signed)
Per pt insurance is not covering coumadin wants to know if he could change to warfarin and what would be the difference

## 2010-08-24 ENCOUNTER — Encounter: Payer: Self-pay | Admitting: Internal Medicine

## 2010-08-24 ENCOUNTER — Other Ambulatory Visit: Payer: Self-pay | Admitting: Pharmacist

## 2010-08-24 MED ORDER — WARFARIN SODIUM 5 MG PO TABS: ORAL_TABLET | ORAL | Status: DC

## 2010-08-24 NOTE — Telephone Encounter (Signed)
Error

## 2010-08-24 NOTE — Telephone Encounter (Signed)
Pt followed by Dr. [name] for INRs.  Told pt would refill Coumadin x 1 then would need to contact Dr. [name] for refills.

## 2010-08-30 ENCOUNTER — Other Ambulatory Visit: Payer: Managed Care, Other (non HMO)

## 2010-08-31 ENCOUNTER — Other Ambulatory Visit: Payer: Self-pay | Admitting: Gastroenterology

## 2010-08-31 DIAGNOSIS — Z1211 Encounter for screening for malignant neoplasm of colon: Secondary | ICD-10-CM

## 2010-08-31 LAB — FECAL OCCULT BLOOD, IMMUNOCHEMICAL: Fecal Occult Bld: NEGATIVE

## 2010-09-02 ENCOUNTER — Ambulatory Visit (INDEPENDENT_AMBULATORY_CARE_PROVIDER_SITE_OTHER): Payer: Managed Care, Other (non HMO) | Admitting: Pulmonary Disease

## 2010-09-02 ENCOUNTER — Encounter: Payer: Self-pay | Admitting: Pulmonary Disease

## 2010-09-02 VITALS — BP 114/68 | HR 75 | Temp 97.7°F | Ht 68.0 in | Wt 223.4 lb

## 2010-09-02 DIAGNOSIS — R0609 Other forms of dyspnea: Secondary | ICD-10-CM

## 2010-09-02 DIAGNOSIS — G4733 Obstructive sleep apnea (adult) (pediatric): Secondary | ICD-10-CM | POA: Insufficient documentation

## 2010-09-02 DIAGNOSIS — R0989 Other specified symptoms and signs involving the circulatory and respiratory systems: Secondary | ICD-10-CM

## 2010-09-02 DIAGNOSIS — R0683 Snoring: Secondary | ICD-10-CM

## 2010-09-02 NOTE — Progress Notes (Signed)
  Subjective:    Patient ID: [name], male    DOB: 12/06/1945, 65 y.o.   MRN: [id number]  HPI The pt is a 64y/o male who I have been asked to see for possible osa.  The patient has been diagnosed with atrial fibrillation, and the question has been raised whether this may be influenced by sleep disorder breathing.  The patient states that he does have loud snoring, but his wife has not noticed abnormal breathing pattern during sleep.  The patient does admit to frequent awakenings at night, and is only rested 50% of the time upon arising.  However, he denies any issues with alertness or sleepiness during the day.  He has no issues while reading or working on the computer.  He does admit that he will sometimes doze while watching television during the day or in the evening.  He has no issues with sleepiness while driving.  The patient's weight is up about 20 pounds over the last 2 years, and his Epworth score today is only 6.  Sleep Questionnaire: What time do you typically go to bed?( Between what hours) 10 pm to 12 am How long does it take you to fall asleep? 1/2 hour How many times during the night do you wake up? 3 What time do you get out of bed to start your day? 0600 Do you drive or operate heavy machinery in your occupation? No How much has your weight changed (up or down) over the past two years? (In pounds) 20 lb (9.072 kg) Have you ever had a sleep study before? No Do you currently use CPAP? No Do you wear oxygen at any time? No    Review of Systems  Constitutional: Positive for unexpected weight change. Negative for fever.  HENT: Negative for ear pain, nosebleeds, congestion, sore throat, rhinorrhea, sneezing, trouble swallowing, dental problem, postnasal drip and sinus pressure.   Eyes: Negative for redness and itching.  Respiratory: Positive for shortness of breath. Negative for cough, chest tightness and wheezing.   Cardiovascular: Positive for palpitations. Negative for leg  swelling.  Gastrointestinal: Negative for nausea and vomiting.  Genitourinary: Negative for dysuria.  Musculoskeletal: Negative for joint swelling.  Skin: Negative for rash.  Neurological: Negative for headaches.  Hematological: Does not bruise/bleed easily.  Psychiatric/Behavioral: Negative for dysphoric mood. The patient is not nervous/anxious.        Objective:   Physical Exam Constitutional:  Well developed, no acute distress  HENT:  Nares patent without discharge, mild deviation to left  Oropharynx without exudate, palate and uvula are normal  Eyes:  Perrla, eomi, no scleral icterus  Neck:  No JVD, no TMG  Cardiovascular:  Irregular rhythm, no rubs or gallops.  No murmurs        Intact distal pulses  Pulmonary :  Normal breath sounds, no stridor or respiratory distress   No rales, rhonchi, or wheezing  Abdominal:  Soft, nondistended, bowel sounds present.  No tenderness noted.   Musculoskeletal:  No lower extremity edema noted.  Lymph Nodes:  No cervical lymphadenopathy noted  Skin:  No cyanosis noted  Neurologic:  Alert, appropriate, moves all 4 extremities without obvious deficit.         Assessment & Plan:

## 2010-09-02 NOTE — Patient Instructions (Signed)
Will schedule for sleep study, and arrange followup to review once results are available.

## 2010-09-04 NOTE — Assessment & Plan Note (Signed)
The patient has a history of significant snoring, frequent awakenings at night, and nonrestorative sleep at least 50% of time.  However, he is not had any abnormal breathing noted, nor has he seen an issue with significant daytime sleepiness.  It is really unclear whether he has sleep apnea or not based on his history, but he is at risk with his obesity.  He also has underlying heart disease which can be adversely affected by sleep disordered breathing.  Given all of the above, I think he would benefit from a sleep study.  The patient is in agreement.

## 2010-09-07 ENCOUNTER — Encounter: Payer: Self-pay | Admitting: Internal Medicine

## 2010-09-07 ENCOUNTER — Ambulatory Visit (INDEPENDENT_AMBULATORY_CARE_PROVIDER_SITE_OTHER): Payer: Managed Care, Other (non HMO) | Admitting: Internal Medicine

## 2010-09-07 DIAGNOSIS — E119 Type 2 diabetes mellitus without complications: Secondary | ICD-10-CM

## 2010-09-07 DIAGNOSIS — E785 Hyperlipidemia, unspecified: Secondary | ICD-10-CM

## 2010-09-07 DIAGNOSIS — I10 Essential (primary) hypertension: Secondary | ICD-10-CM

## 2010-09-07 DIAGNOSIS — Z7901 Long term (current) use of anticoagulants: Secondary | ICD-10-CM

## 2010-09-07 DIAGNOSIS — I4891 Unspecified atrial fibrillation: Secondary | ICD-10-CM

## 2010-09-07 LAB — POCT INR: INR: 1.9
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
## 2010-09-07 NOTE — Patient Instructions (Signed)
Blood Pressure Goal  Ideally is an AVERAGE < 135/85. This AVERAGE should be calculated from @ least 5-7 BP readings taken @ different times of day on different days of week. You should not respond to isolated BP readings , but rather the AVERAGE for that week. Call if BP remains elevated after Lorazepam taken on an " as needed" basis as Rxed. Please  schedule fasting Labs in 14 days: BMET,Lipids, hepatic panel,  TSH, A1c, urine microalbumin (250.00, 794.5,401.9). PT/INR can be done then

## 2010-09-08 NOTE — Telephone Encounter (Signed)
Patient had hip surgery done in 06/2010  Wants to try another DCCV now  Will come in for appointment prior to DCCV on 09/26/10 at 8:45am

## 2010-09-08 NOTE — Telephone Encounter (Signed)
Patient considering cardioversion on these dates  9/11 ; 9/13 ; 9/18 ; 9./20  in am. Would this be O.K. With Dr. Johney Frame.

## 2010-09-16 ENCOUNTER — Telehealth: Payer: Self-pay | Admitting: Internal Medicine

## 2010-09-16 NOTE — Telephone Encounter (Signed)
Pt wants to confirm cardioversion appt day and time

## 2010-09-16 NOTE — Telephone Encounter (Signed)
Not going to schedule the DCCV until he sees Dr Johney Frame  Patient aware

## 2010-09-20 ENCOUNTER — Other Ambulatory Visit: Payer: Self-pay | Admitting: Internal Medicine

## 2010-09-20 DIAGNOSIS — E119 Type 2 diabetes mellitus without complications: Secondary | ICD-10-CM

## 2010-09-20 DIAGNOSIS — I10 Essential (primary) hypertension: Secondary | ICD-10-CM

## 2010-09-20 DIAGNOSIS — R946 Abnormal results of thyroid function studies: Secondary | ICD-10-CM

## 2010-09-21 ENCOUNTER — Telehealth: Payer: Self-pay | Admitting: Internal Medicine

## 2010-09-21 ENCOUNTER — Other Ambulatory Visit (INDEPENDENT_AMBULATORY_CARE_PROVIDER_SITE_OTHER): Payer: Managed Care, Other (non HMO)

## 2010-09-21 DIAGNOSIS — R946 Abnormal results of thyroid function studies: Secondary | ICD-10-CM

## 2010-09-21 DIAGNOSIS — I10 Essential (primary) hypertension: Secondary | ICD-10-CM

## 2010-09-21 DIAGNOSIS — E119 Type 2 diabetes mellitus without complications: Secondary | ICD-10-CM

## 2010-09-21 LAB — PROTIME-INR
INR: 2.7 ratio — ABNORMAL HIGH (ref 0.8–1.0)
Prothrombin Time: 27.3 s — ABNORMAL HIGH (ref 10.2–12.4)

## 2010-09-21 LAB — LIPID PANEL
Cholesterol: 184 mg/dL (ref 0–200)
HDL: 50 mg/dL (ref >39.00)
LDL Cholesterol: 95 mg/dL (ref 0–99)
Total CHOL/HDL Ratio: 4
Triglycerides: 196 mg/dL — ABNORMAL HIGH (ref 0.0–149.0)
VLDL: 39.2 mg/dL (ref 0.0–40.0)

## 2010-09-21 LAB — HEPATIC FUNCTION PANEL
ALT: 19 U/L (ref 0–53)
AST: 25 U/L (ref 0–37)
Albumin: 4.4 g/dL (ref 3.5–5.2)
Alkaline Phosphatase: 65 U/L (ref 39–117)
Bilirubin, Direct: 0 mg/dL (ref 0.0–0.3)
Total Bilirubin: 0.7 mg/dL (ref 0.3–1.2)
Total Protein: 7.8 g/dL (ref 6.0–8.3)

## 2010-09-21 LAB — BASIC METABOLIC PANEL
BUN: 24 mg/dL — ABNORMAL HIGH (ref 6–23)
CO2: 28 mEq/L (ref 19–32)
Calcium: 9.5 mg/dL (ref 8.4–10.5)
Chloride: 103 mEq/L (ref 96–112)
Creatinine, Ser: 1.1 mg/dL (ref 0.4–1.5)
GFR: 73.18 mL/min (ref >60.00)
Glucose, Bld: 151 mg/dL — ABNORMAL HIGH (ref 70–99)
Potassium: 5.1 mEq/L (ref 3.5–5.1)
Sodium: 141 mEq/L (ref 135–145)

## 2010-09-21 LAB — MICROALBUMIN / CREATININE URINE RATIO
Creatinine,U: 262.6 mg/dL
Microalb Creat Ratio: 1.7 mg/g (ref 0.0–30.0)
Microalb, Ur: 4.5 mg/dL — ABNORMAL HIGH (ref 0.0–1.9)

## 2010-09-21 LAB — HEMOGLOBIN A1C: Hgb A1c MFr Bld: 6.5 % (ref 4.6–6.5)

## 2010-09-21 LAB — TSH: TSH: 5.38 u[IU]/mL (ref 0.35–5.50)

## 2010-09-21 NOTE — Telephone Encounter (Signed)
Patient needs to be triaged.

## 2010-09-21 NOTE — Telephone Encounter (Signed)
Discuss with patient  

## 2010-09-21 NOTE — Telephone Encounter (Addendum)
Pt came in today for labs. Pt c/o headache and increase in pulse and BP. Pt denies any sob,chest pain/pressure, dizziness, increase in bruising or any bleeding. Pt notes that he recently went from brand name to generic. Please advise

## 2010-09-21 NOTE — Progress Notes (Signed)
Labs only

## 2010-09-21 NOTE — Telephone Encounter (Signed)
Generic Coumadin should not cause any symptoms. Monitor blood pressure;if  it is over 130/85 on average  Or if  the headache persists; please come in to be evaluated.

## 2010-09-23 ENCOUNTER — Telehealth: Payer: Self-pay | Admitting: Internal Medicine

## 2010-09-23 NOTE — Telephone Encounter (Signed)
Labs Mailed

## 2010-09-23 NOTE — Telephone Encounter (Signed)
See Appendum

## 2010-09-23 NOTE — Telephone Encounter (Signed)
Spoke with patient, patient aware PT/INR is 2.7,  Patient states he is taking 2.5mg  daily, skipped 1 day intentionally because when he was checking blood sugar his blood  looked thin. Patient states he has an appointment with cardiology on Monday   Patient aware other labs to be mailed once addressed, patient ok'd

## 2010-09-26 ENCOUNTER — Ambulatory Visit (INDEPENDENT_AMBULATORY_CARE_PROVIDER_SITE_OTHER): Payer: Managed Care, Other (non HMO) | Admitting: *Deleted

## 2010-09-26 ENCOUNTER — Encounter: Payer: Self-pay | Admitting: Internal Medicine

## 2010-09-26 ENCOUNTER — Telehealth: Payer: Self-pay | Admitting: Internal Medicine

## 2010-09-26 ENCOUNTER — Ambulatory Visit (INDEPENDENT_AMBULATORY_CARE_PROVIDER_SITE_OTHER): Payer: Managed Care, Other (non HMO) | Admitting: Internal Medicine

## 2010-09-26 VITALS — BP 141/85 | HR 86 | Ht 68.0 in | Wt 221.0 lb

## 2010-09-26 DIAGNOSIS — Z7901 Long term (current) use of anticoagulants: Secondary | ICD-10-CM

## 2010-09-26 DIAGNOSIS — I10 Essential (primary) hypertension: Secondary | ICD-10-CM

## 2010-09-26 DIAGNOSIS — R635 Abnormal weight gain: Secondary | ICD-10-CM

## 2010-09-26 DIAGNOSIS — I4891 Unspecified atrial fibrillation: Secondary | ICD-10-CM

## 2010-09-26 LAB — POCT INR: INR: 2.6

## 2010-09-26 MED ORDER — RIVAROXABAN 20 MG PO TABS: 20.0000 mg | ORAL_TABLET | Freq: Every day | ORAL | Status: DC

## 2010-09-26 NOTE — Assessment & Plan Note (Signed)
Weight loss advised 

## 2010-09-26 NOTE — Telephone Encounter (Signed)
Spoke with patient and tried to explain to him the card for $25  He is going to try and get his wife to investigate more and make sure he will have coverage
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
## 2010-09-26 NOTE — Patient Instructions (Signed)
Your physician recommends that you schedule a follow-up appointment in:---will call when he is ready to come in  Your physician has recommended you make the following change in your medication: stop Coumadin and start Xarelto 20mg  daily

## 2010-09-26 NOTE — Assessment & Plan Note (Signed)
Stable No change required today  

## 2010-09-26 NOTE — Telephone Encounter (Signed)
Pt calling regarding medicine prescribed today, pt would like look at alternatives. Pt was prescribed xarelto and pt is finding that to be the most expensive option, pt wants to know if he could start pradaxia instead. Please return call to discuss further.

## 2010-09-26 NOTE — Assessment & Plan Note (Signed)
Rate controlled Symptoms appear better We discussed pros and cons to coumadin/ pradaxa/ and xarelto today.  At this time, he would like to try xarelto.  I will therefore stop coumadin and start Xarelto once INR <3.  We will continue rate controll as our long term strategy.  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  I told him that I think that this is very unlikely.  However, I did agree to consider cardioversion once his metal levels are felt to be sufficiently low. I have also offered referral to Dr Smith Robert at [facility] for consideration of Converge procedure.  He has already had consultation at [facility] and [facility].  At this point, I think that rate control is reasonable.  He will contact my office when he is ready to proceed with cardioversion.

## 2010-10-05 ENCOUNTER — Telehealth: Payer: Self-pay | Admitting: Internal Medicine

## 2010-10-05 NOTE — Telephone Encounter (Signed)
Please advise if Pt is to continue on levothyroxine 25 mcg

## 2010-10-05 NOTE — Telephone Encounter (Signed)
Left message to call office with Pt wife. 

## 2010-10-05 NOTE — Telephone Encounter (Signed)
Yes; recheck TSH in 4 months; it was high normal

## 2010-10-05 NOTE — Telephone Encounter (Signed)
Discuss with patient  

## 2010-10-06 ENCOUNTER — Other Ambulatory Visit: Payer: Self-pay | Admitting: Internal Medicine

## 2010-10-06 MED ORDER — LEVOTHYROXINE SODIUM 25 MCG PO TABS: 25.0000 ug | ORAL_TABLET | Freq: Every day | ORAL | Status: DC

## 2010-10-06 NOTE — Telephone Encounter (Signed)
Patient needs refill for levoxyl tablets- [facility]  --90 day supply -  2 refills

## 2010-10-06 NOTE — Telephone Encounter (Signed)
RX sent

## 2010-10-18 ENCOUNTER — Telehealth: Payer: Self-pay | Admitting: Pulmonary Disease

## 2010-10-18 ENCOUNTER — Telehealth: Payer: Self-pay | Admitting: Internal Medicine

## 2010-10-18 ENCOUNTER — Ambulatory Visit (INDEPENDENT_AMBULATORY_CARE_PROVIDER_SITE_OTHER): Payer: Managed Care, Other (non HMO) | Admitting: Pulmonary Disease

## 2010-10-18 DIAGNOSIS — R0683 Snoring: Secondary | ICD-10-CM

## 2010-10-18 DIAGNOSIS — G4733 Obstructive sleep apnea (adult) (pediatric): Secondary | ICD-10-CM

## 2010-10-18 NOTE — Telephone Encounter (Signed)
Called and spoke with pt's wife.  Informed her pt needs ov with [facility] to discuss results. Wife stated she would relay message to pt so he could contact the office himself to schedule OV.

## 2010-10-18 NOTE — Telephone Encounter (Signed)
[name], pt needs ov to discuss sleep study results.  Thanks.

## 2010-10-18 NOTE — Telephone Encounter (Signed)
Pt calling re getting the name of the doctor dr allred had referred him to at [facility] for conversion

## 2010-10-18 NOTE — Telephone Encounter (Signed)
Pt's wife, [name], returning call & can be reached at [phone].  [name]

## 2010-10-18 NOTE — Telephone Encounter (Signed)
Dr Abelino Derrick at [facility]

## 2010-10-18 NOTE — Telephone Encounter (Signed)
[id number] 

## 2010-10-20 ENCOUNTER — Telehealth: Payer: Self-pay | Admitting: Internal Medicine

## 2010-10-20 NOTE — Telephone Encounter (Signed)
error 

## 2010-10-24 ENCOUNTER — Telehealth: Payer: Self-pay | Admitting: Internal Medicine

## 2010-10-24 NOTE — Telephone Encounter (Signed)
All Cardiac records faxed to [facility] @ [phone] Attn Dr.Mounsey  10/24/10/km

## 2010-10-31 LAB — BASIC METABOLIC PANEL
BUN: 18
BUN: 26 — ABNORMAL HIGH
BUN: 27 — ABNORMAL HIGH
CO2: 26
CO2: 26
CO2: 27
Calcium: 8.3 — ABNORMAL LOW
Calcium: 8.4
Calcium: 8.9
Chloride: 104
Chloride: 105
Chloride: 112
Creatinine, Ser: 1.1
Creatinine, Ser: 1.18
Creatinine, Ser: 1.53 — ABNORMAL HIGH
GFR calc Af Amer: 57 — ABNORMAL LOW
GFR calc Af Amer: >60
GFR calc Af Amer: >60
GFR calc non Af Amer: 47 — ABNORMAL LOW
GFR calc non Af Amer: >60
GFR calc non Af Amer: >60
Glucose, Bld: 104 — ABNORMAL HIGH
Glucose, Bld: 113 — ABNORMAL HIGH
Glucose, Bld: 79
Potassium: 3.9
Potassium: 4.2
Potassium: 4.6
Sodium: 136
Sodium: 139
Sodium: 140

## 2010-10-31 LAB — CBC
HCT: 28.7 — ABNORMAL LOW
HCT: 34.6 — ABNORMAL LOW
HCT: 39.1
Hemoglobin: 12 — ABNORMAL LOW
Hemoglobin: 13.4
Hemoglobin: 9.9 — ABNORMAL LOW
MCHC: 34.2
MCHC: 34.5
MCHC: 34.6
MCV: 87.5
MCV: 88.1
MCV: 88.2
Platelets: 165
Platelets: 209
Platelets: 252
RBC: 3.25 — ABNORMAL LOW
RBC: 3.93 — ABNORMAL LOW
RBC: 4.47
RDW: 14.6 — ABNORMAL HIGH
RDW: 14.9 — ABNORMAL HIGH
RDW: 15.5 — ABNORMAL HIGH
WBC: 6.6
WBC: 7.6
WBC: 8

## 2010-10-31 LAB — URINALYSIS, ROUTINE W REFLEX MICROSCOPIC
Bilirubin Urine: NEGATIVE
Glucose, UA: NEGATIVE
Hgb urine dipstick: NEGATIVE
Ketones, ur: NEGATIVE
Nitrite: NEGATIVE
Protein, ur: NEGATIVE
Specific Gravity, Urine: 1.02
Urobilinogen, UA: 1
pH: 7

## 2010-10-31 LAB — URINE MICROSCOPIC-ADD ON

## 2010-10-31 LAB — COMPREHENSIVE METABOLIC PANEL
ALT: 17
AST: 23
Albumin: 4.2
Alkaline Phosphatase: 30 — ABNORMAL LOW
BUN: 29 — ABNORMAL HIGH
CO2: 30
Calcium: 10.5
Chloride: 105
Creatinine, Ser: 1.21
GFR calc Af Amer: >60
GFR calc non Af Amer: >60
Glucose, Bld: 121 — ABNORMAL HIGH
Potassium: 4.6
Sodium: 145
Total Bilirubin: 0.7
Total Protein: 7.5

## 2010-10-31 LAB — PROTIME-INR
INR: 1.1
INR: 1.2
INR: 2 — ABNORMAL HIGH
INR: 2.2 — ABNORMAL HIGH
Prothrombin Time: 14
Prothrombin Time: 15.2
Prothrombin Time: 23.3 — ABNORMAL HIGH
Prothrombin Time: 26 — ABNORMAL HIGH

## 2010-10-31 LAB — TYPE AND SCREEN
ABO/RH(D): A POS
Antibody Screen: NEGATIVE

## 2010-10-31 LAB — APTT: aPTT: 30

## 2010-10-31 LAB — ABO/RH: ABO/RH(D): A POS

## 2010-11-01 ENCOUNTER — Encounter: Payer: Self-pay | Admitting: *Deleted

## 2010-11-01 ENCOUNTER — Telehealth: Payer: Self-pay | Admitting: *Deleted

## 2010-11-01 DIAGNOSIS — I4891 Unspecified atrial fibrillation: Secondary | ICD-10-CM

## 2010-11-01 DIAGNOSIS — IMO0002 Reserved for concepts with insufficient information to code with codable children: Secondary | ICD-10-CM

## 2010-11-01 NOTE — Telephone Encounter (Signed)
Pt scheduled for dccv

## 2010-11-01 NOTE — Telephone Encounter (Signed)
Wife aware of date and time 12/09/10 at 11:00a.

## 2010-11-02 ENCOUNTER — Encounter: Payer: Self-pay | Admitting: Pulmonary Disease

## 2010-11-02 ENCOUNTER — Ambulatory Visit (INDEPENDENT_AMBULATORY_CARE_PROVIDER_SITE_OTHER): Payer: Managed Care, Other (non HMO) | Admitting: Pulmonary Disease

## 2010-11-02 VITALS — BP 138/86 | HR 85 | Temp 97.5°F | Ht 68.0 in | Wt 225.8 lb

## 2010-11-02 DIAGNOSIS — G4733 Obstructive sleep apnea (adult) (pediatric): Secondary | ICD-10-CM

## 2010-11-02 NOTE — Assessment & Plan Note (Addendum)
The patient has mild obstructive sleep apnea by his recent study, and I have reviewed the various treatment options with him.  These include a trial of weight loss alone, upper airway surgery, dental appliance, and CPAP.  He feels that he is mildly symptomatic during the night, and is not overly symptomatic during the day.  I suspect this is not causing his atrial fibrillation, but I did explain to him that rate control can sometimes be improved with treatment of sleep disordered breathing.  At this point, he would like to work aggressively on weight loss for the next 6 months.  I have asked him to discuss his mild sleep apnea with his cardiologist at his upcoming visit at [facility].  He is to call me if he decides to treat this more aggressively.

## 2010-11-02 NOTE — Progress Notes (Signed)
  Subjective:    Patient ID: [name], male    DOB: [birth year], 65 y.o.   MRN: [id number]  HPI The patient comes in today for followup of his recent sleep study.  He was found to have mild obstructive sleep apnea with an AHI of 16 events per hour.  He had mild transient oxygen desaturation as low as 82%.  I have reviewed the results with him in detail, and answered all of his questions.   Review of Systems  Constitutional: Negative for fever and unexpected weight change.  HENT: Positive for rhinorrhea. Negative for ear pain, nosebleeds, congestion, sore throat, sneezing, trouble swallowing, dental problem, postnasal drip and sinus pressure.   Eyes: Positive for itching. Negative for redness.  Respiratory: Positive for shortness of breath. Negative for cough, chest tightness and wheezing.   Cardiovascular: Negative for palpitations and leg swelling.  Gastrointestinal: Negative for nausea and vomiting.  Genitourinary: Negative for dysuria.  Musculoskeletal: Negative for joint swelling.  Skin: Negative for rash.  Neurological: Negative for headaches.  Hematological: Does not bruise/bleed easily.  Psychiatric/Behavioral: Negative for dysphoric mood. The patient is not nervous/anxious.        Objective:   Physical Exam Overweight male in no acute distress Nose without purulence or discharge noted Lower extremities without edema, no cyanosis Alert and oriented, does not appear to be sleepy, moves all 4 extremities.       Assessment & Plan:

## 2010-11-02 NOTE — Patient Instructions (Signed)
Work aggressively on weight loss If you wish to consider treating your mild sleep apnea more aggressively, could consider upper airway surgery, dental appliance, and cpap.

## 2010-11-18 ENCOUNTER — Encounter: Payer: Self-pay | Admitting: Internal Medicine

## 2010-11-29 ENCOUNTER — Telehealth: Payer: Self-pay | Admitting: Internal Medicine

## 2010-11-29 NOTE — Telephone Encounter (Signed)
Pt's wife wanted to talk to you about blood work he is having this Friday.

## 2010-11-30 NOTE — Telephone Encounter (Signed)
Spoke with Dr Johney Frame and patient does not need to be taking, advised wife

## 2010-11-30 NOTE — Telephone Encounter (Signed)
Pt's wife called.  returning your call please call back

## 2010-11-30 NOTE — Telephone Encounter (Signed)
Advised wife about the labs and he will pick up order for them.  She wanted to know if he should be back on [facility] for cardioversion. Told her would forward to [employer] and let her discuss with Dr Johney Frame and call her back

## 2010-11-30 NOTE — Telephone Encounter (Signed)
Pt returning call to [place]. Please return call to discuss further.

## 2010-11-30 NOTE — Telephone Encounter (Signed)
Left message to call back.  We are not going to be able to get labs requested here secondary to lab not being able to process it.  Per Tresa Endo RN can have patient come by and get order to go [facility] downstairs.

## 2010-12-01 ENCOUNTER — Encounter (HOSPITAL_COMMUNITY): Payer: Self-pay | Admitting: Pharmacy Technician

## 2010-12-01 ENCOUNTER — Telehealth: Payer: Self-pay | Admitting: Internal Medicine

## 2010-12-01 NOTE — Telephone Encounter (Signed)
Spoke with Pam and let her know that a CT of his heart had been done in 10/27/2009 and that Dr Smith Robert could call Dr Johney Frame to discuss further.  I gave Pam Dr Allred's cell number

## 2010-12-01 NOTE — Telephone Encounter (Signed)
New problem:  Per Pam pt need an heart cath . Patient requesting [place] location [place].

## 2010-12-02 ENCOUNTER — Encounter: Payer: Self-pay | Admitting: Internal Medicine

## 2010-12-02 ENCOUNTER — Other Ambulatory Visit: Payer: Managed Care, Other (non HMO) | Admitting: *Deleted

## 2010-12-05 ENCOUNTER — Other Ambulatory Visit: Payer: Managed Care, Other (non HMO) | Admitting: *Deleted

## 2010-12-07 ENCOUNTER — Ambulatory Visit (INDEPENDENT_AMBULATORY_CARE_PROVIDER_SITE_OTHER): Payer: Managed Care, Other (non HMO) | Admitting: *Deleted

## 2010-12-07 ENCOUNTER — Ambulatory Visit (HOSPITAL_COMMUNITY): Payer: Managed Care, Other (non HMO) | Attending: Cardiology | Admitting: Radiology

## 2010-12-07 ENCOUNTER — Other Ambulatory Visit (HOSPITAL_COMMUNITY): Payer: Self-pay | Admitting: Radiology

## 2010-12-07 ENCOUNTER — Other Ambulatory Visit (INDEPENDENT_AMBULATORY_CARE_PROVIDER_SITE_OTHER): Payer: Managed Care, Other (non HMO) | Admitting: *Deleted

## 2010-12-07 DIAGNOSIS — E785 Hyperlipidemia, unspecified: Secondary | ICD-10-CM | POA: Insufficient documentation

## 2010-12-07 DIAGNOSIS — I10 Essential (primary) hypertension: Secondary | ICD-10-CM

## 2010-12-07 DIAGNOSIS — I4891 Unspecified atrial fibrillation: Secondary | ICD-10-CM | POA: Insufficient documentation

## 2010-12-07 DIAGNOSIS — I079 Rheumatic tricuspid valve disease, unspecified: Secondary | ICD-10-CM | POA: Insufficient documentation

## 2010-12-07 DIAGNOSIS — G4733 Obstructive sleep apnea (adult) (pediatric): Secondary | ICD-10-CM | POA: Insufficient documentation

## 2010-12-07 LAB — BASIC METABOLIC PANEL
BUN: 24 mg/dL — ABNORMAL HIGH (ref 6–23)
CO2: 32 mEq/L (ref 19–32)
Calcium: 9.6 mg/dL (ref 8.4–10.5)
Chloride: 104 mEq/L (ref 96–112)
Creatinine, Ser: 1.2 mg/dL (ref 0.4–1.5)
GFR: 68.02 mL/min (ref >60.00)
Glucose, Bld: 134 mg/dL — ABNORMAL HIGH (ref 70–99)
Potassium: 4.7 mEq/L (ref 3.5–5.1)
Sodium: 143 mEq/L (ref 135–145)

## 2010-12-07 LAB — CBC WITH DIFFERENTIAL/PLATELET
Basophils Absolute: 0.1 10*3/uL (ref 0.0–0.1)
Basophils Relative: 0.7 % (ref 0.0–3.0)
Eosinophils Absolute: 0.9 10*3/uL — ABNORMAL HIGH (ref 0.0–0.7)
Eosinophils Relative: 8.5 % — ABNORMAL HIGH (ref 0.0–5.0)
HCT: 48.3 % (ref 39.0–52.0)
Hemoglobin: 16.4 g/dL (ref 13.0–17.0)
Lymphocytes Relative: 15.9 % (ref 12.0–46.0)
Lymphs Abs: 1.7 10*3/uL (ref 0.7–4.0)
MCHC: 34 g/dL (ref 30.0–36.0)
MCV: 89.8 fl (ref 78.0–100.0)
Monocytes Absolute: 0.8 10*3/uL (ref 0.1–1.0)
Monocytes Relative: 8 % (ref 3.0–12.0)
Neutro Abs: 7 10*3/uL (ref 1.4–7.7)
Neutrophils Relative %: 66.9 % (ref 43.0–77.0)
Platelets: 212 10*3/uL (ref 150.0–400.0)
RBC: 5.38 Mil/uL (ref 4.22–5.81)
RDW: 14.6 % (ref 11.5–14.6)
WBC: 10.5 10*3/uL (ref 4.5–10.5)

## 2010-12-07 LAB — MAGNESIUM: Magnesium: 2.3 mg/dL (ref 1.5–2.5)

## 2010-12-09 ENCOUNTER — Ambulatory Visit (HOSPITAL_COMMUNITY): Payer: Managed Care, Other (non HMO) | Admitting: *Deleted

## 2010-12-09 ENCOUNTER — Encounter (HOSPITAL_COMMUNITY): Payer: Self-pay | Admitting: *Deleted

## 2010-12-09 ENCOUNTER — Encounter (HOSPITAL_COMMUNITY)
Admission: RE | Disposition: A | Discharge status: Home / Self Care | Payer: Self-pay | Source: Ambulatory Visit | Attending: Cardiology

## 2010-12-09 ENCOUNTER — Ambulatory Visit (HOSPITAL_COMMUNITY)
Admission: RE | Admit: 2010-12-09 | Discharge: 2010-12-09 | Disposition: A | Discharge status: Home / Self Care | Payer: Managed Care, Other (non HMO) | Source: Ambulatory Visit | Attending: Cardiology | Admitting: Cardiology

## 2010-12-09 DIAGNOSIS — I4891 Unspecified atrial fibrillation: Secondary | ICD-10-CM | POA: Insufficient documentation

## 2010-12-09 DIAGNOSIS — E119 Type 2 diabetes mellitus without complications: Secondary | ICD-10-CM | POA: Insufficient documentation

## 2010-12-09 DIAGNOSIS — I10 Essential (primary) hypertension: Secondary | ICD-10-CM | POA: Insufficient documentation

## 2010-12-09 DIAGNOSIS — G4730 Sleep apnea, unspecified: Secondary | ICD-10-CM | POA: Insufficient documentation

## 2010-12-09 HISTORY — PX: CARDIOVERSION: SHX1299

## 2010-12-09 LAB — GLUCOSE, CAPILLARY: Glucose-Capillary: 196 mg/dL — ABNORMAL HIGH (ref 70–99)

## 2010-12-09 SURGERY — CARDIOVERSION
Anesthesia: Monitor Anesthesia Care | Wound class: Clean

## 2010-12-09 MED ORDER — SODIUM CHLORIDE 0.45 % IV SOLN
INTRAVENOUS | Status: DC
  Administered 2010-12-09: 10:00:00 via INTRAVENOUS

## 2010-12-09 NOTE — Anesthesia Postprocedure Evaluation (Signed)
Anesthesia Post Note  Patient: [name]  Procedure(s) Performed:  CARDIOVERSION  Anesthesia type: MAC  Patient location: short stay  Post pain: Pain level controlled and Adequate analgesia  Post assessment: Post-op Vital signs reviewed, Patient's Cardiovascular Status Stable, Respiratory Function Stable, Patent Airway and Pain level controlled  Last Vitals:  Filed Vitals:   12/09/10 0918  BP: 138/85  Pulse: 91  Temp: 36.2 C  Resp: 18    Post vital signs: Reviewed and stable  Level of consciousness: awake, alert  and oriented  Complications: No apparent anesthesia complications

## 2010-12-09 NOTE — Procedures (Signed)
Electrical Cardioversion Procedure Note [name] [id number] 1945/08/07  Procedure: Electrical Cardioversion Indications:  Atrial Fibrillation  Procedure Details Consent: Risks of procedure as well as the alternatives and risks of each were explained to the (patient/caregiver).  Consent for procedure obtained. Time Out: Verified patient identification, verified procedure, site/side was marked, verified correct patient position, special equipment/implants available, medications/allergies/relevent history reviewed, required imaging and test results available.  Performed  Patient placed on cardiac monitor, pulse oximetry, supplemental oxygen as necessary.  Sedation given: Propofol Pacer pads placed anterior and posterior chest.  Cardioverted 1 time(s).  Cardioverted at 200J.  Evaluation Findings: Post procedure EKG shows: NSR Complications: None Patient did tolerate procedure well.   Marca Ancona 12/09/2010, 10:36 AM

## 2010-12-09 NOTE — H&P (Signed)
No changes compared to Dr. Jenel Lucks prior office note.  [name] 12/09/2010 10:34 AM

## 2010-12-09 NOTE — Transfer of Care (Signed)
Immediate Anesthesia Transfer of Care Note  Patient: [name]  Procedure(s) Performed:  CARDIOVERSION  Patient Location: PACU and Short Stay  Anesthesia Type: General  Level of Consciousness: sedated and responds to stimulation  Airway & Oxygen Therapy: Patient Spontanous Breathing and Patient connected to nasal cannula oxygen  Post-op Assessment: Post -op Vital signs reviewed and stable  Post vital signs: Reviewed and stable  Complications: No apparent anesthesia complications

## 2010-12-09 NOTE — Anesthesia Preprocedure Evaluation (Addendum)
Anesthesia Evaluation  Patient identified by MRN, date of birth, ID band Patient awake    Reviewed: Allergy & Precautions, H&P , NPO status , Patient's Chart, lab work & pertinent test results, reviewed documented beta blocker date and time   Airway Mallampati: II TM Distance: <3 FB Neck ROM: Full    Dental  (+) Teeth Intact and Dental Advisory Given   Pulmonary sleep apnea ,          Cardiovascular hypertension, Pt. on home beta blockers and Pt. on medications     Neuro/Psych    GI/Hepatic   Endo/Other  Diabetes mellitus-, Type 2, Oral Hypoglycemic AgentsHypothyroidism   Renal/GU      Musculoskeletal   Abdominal   Peds  Hematology   Anesthesia Other Findings   Reproductive/Obstetrics                           Anesthesia Physical Anesthesia Plan  ASA: III  Anesthesia Plan: MAC   Post-op Pain Management:    Induction: Intravenous  Airway Management Planned: Mask  Additional Equipment:   Intra-op Plan:   Post-operative Plan:   Informed Consent:   Dental advisory given  Plan Discussed with: Anesthesiologist  Anesthesia Plan Comments:         Anesthesia Quick Evaluation

## 2010-12-09 NOTE — Preoperative (Signed)
Beta Blockers   Reason not to administer Beta Blockers:atenolol given at 0700

## 2010-12-12 ENCOUNTER — Encounter (HOSPITAL_COMMUNITY): Payer: Self-pay | Admitting: Internal Medicine

## 2010-12-12 ENCOUNTER — Encounter (HOSPITAL_COMMUNITY): Payer: Self-pay | Admitting: Cardiology

## 2010-12-13 ENCOUNTER — Telehealth: Payer: Self-pay

## 2010-12-13 DIAGNOSIS — R739 Hyperglycemia, unspecified: Secondary | ICD-10-CM

## 2010-12-13 NOTE — Telephone Encounter (Signed)
Pt's wife called to advise that pt's blood sugars have been high and she wants to know if MD wants to change dosing on diabetes medication because they are going on vacation for 2 weeks. She also states that Dr. Johney Frame "found a thyroid issue" and was told to f/u with Dr. [name] concerning that.   Advised wife that pt would need an OV to discuss with [facility]. Pt's wife requesting a call from Dr. [name] on Thursday because they will be leaving to go on vacation Friday.

## 2010-12-14 NOTE — Telephone Encounter (Signed)
I recommend an A1c  & for him to check glucoses fasting & 2 hours after lunch & dinner (2 hrs from 1st bite); see me if possible before vacation. TSH was high normal in 9/12

## 2010-12-14 NOTE — Telephone Encounter (Signed)
Discuss with patient wife can only come in on tomorrow will call in AM to see if opening are available.

## 2010-12-15 ENCOUNTER — Ambulatory Visit: Payer: Managed Care, Other (non HMO) | Admitting: Internal Medicine

## 2010-12-15 DIAGNOSIS — Z0289 Encounter for other administrative examinations: Secondary | ICD-10-CM

## 2010-12-15 NOTE — Telephone Encounter (Signed)
See if can get labs then come @ 4:15 pm

## 2010-12-15 NOTE — Telephone Encounter (Signed)
Appt scheduled, labs orders faxed at Pt request to [phone].

## 2010-12-15 NOTE — Telephone Encounter (Signed)
Addended byDuaine Dredge, Diem Pagnotta L on: 12/15/2010 10:14 AM   Modules accepted: Orders

## 2010-12-17 HISTORY — PX: LOOP RECORDER IMPLANT: SHX5954

## 2010-12-30 ENCOUNTER — Encounter: Payer: Self-pay | Admitting: Internal Medicine

## 2010-12-30 ENCOUNTER — Other Ambulatory Visit: Payer: Self-pay | Admitting: Internal Medicine

## 2010-12-30 ENCOUNTER — Ambulatory Visit (INDEPENDENT_AMBULATORY_CARE_PROVIDER_SITE_OTHER): Payer: Managed Care, Other (non HMO) | Admitting: Internal Medicine

## 2010-12-30 DIAGNOSIS — E119 Type 2 diabetes mellitus without complications: Secondary | ICD-10-CM

## 2010-12-30 DIAGNOSIS — E039 Hypothyroidism, unspecified: Secondary | ICD-10-CM

## 2010-12-30 DIAGNOSIS — I4891 Unspecified atrial fibrillation: Secondary | ICD-10-CM

## 2010-12-30 DIAGNOSIS — N259 Disorder resulting from impaired renal tubular function, unspecified: Secondary | ICD-10-CM

## 2010-12-30 MED ORDER — LEVOTHYROXINE SODIUM 25 MCG PO TABS: ORAL_TABLET | ORAL | Status: DC

## 2010-12-30 NOTE — Assessment & Plan Note (Signed)
He states his catheterizations have not revealed significant coronary disease. His C-reactive protein is significantly elevated at 11.73. He is not on aspirin; he is on Xarelto for AF. Management will be deferred to Dr. Johney Frame. BNP is mildly elevated at 357. The atrial fib did not respond to recent intervention; he will be seen at [facility] for convergence procedure.

## 2010-12-30 NOTE — Progress Notes (Signed)
Subjective:    Patient ID: [name], male    DOB: 02-Mar-1945, 65 y.o.   MRN: [id number]  HPI Diabetes status assessment: Fasting or morning glucose range: in 140s ; previously in 120s Highest glucose 2 hours after any meal:  < 180. Hypoglycemia :  no .                                                     Excess thirst;hunger; urination:  no.                                  Lightheadedness with standing:  no. Chest pain:  no ; Palpitations :AF ;  Pain in  calves with walking:  no .                                                                                                                                 Non healing skin  ulcers or sores,especially over the feet:  no. Numbness or tingling or burning in feet : no .                                                                                                                                              Significant change in  Weight : stable. Vision changes : slightly blurred  .                                                                    Exercise : walking 15 min / day . Nutrition/diet:  Recently on trip to [place]. Medication compliance : yes. Medication adverse effects:no   . Eye exam : 8/12, no retinopathy. Foot care : 12 mos ago.  A1c/ urine microalbumin monitor:  7.5% . Creatinine is elevated at 1.25; GFR is 46 with normals over 59.  His TSH is 6.18 on  levothyroxin  25 mcg / day for past  6-8 months.     Review of Systems     Objective:   Physical Exam Gen.: well-nourished, appropriate and alert, slight weight excess Eyes: No lid/conjunctival changes Neck:  thyroid normal Respiratory: No increased work of breathing or abnormal breath sounds Cardiac : slow irregular rhythm, no  gallop, murmur Abdomen: No organomegaly ,masses, bruits or aortic enlargement Lymph: No lymphadenopathy of the neck or axilla Skin: No rashes, lesions, ulcers or ischemic changes; deeply tanned Muscle skeletal: no nail changes;  joints Vasc:All pulses intact, no bruits present Neuro: Deep tendon reflexes decreased @ knees, alert & oriented, sensation over feet normal Psych: judgment and insight, mood and affect normal         Assessment & Plan:

## 2010-12-30 NOTE — Assessment & Plan Note (Signed)
TSH is 6.18; goal being 1-3. See increased dose in  thyroid supplementation. TSH should be rechecked in 10 weeks

## 2010-12-30 NOTE — Patient Instructions (Signed)
Preventive Health Care: Exercise at least 30-45 minutes a day,  3-4 days a week.   Eat a low-fat diet with lots of fruits and vegetables, up to 7-9 servings per day. Avoid obesity; your goal is waist measurement < 40 inches.Consume less than 40 grams of sugar per day from foods & drinks with High Fructose Corn Sugar as #1,2,3 or # 4 on label. Follow the low carb nutrition program in [employer] as closely as possible to prevent Diabetes progression & complications. White carbohydrates (potatoes, rice, bread, and pasta) have a high spike of sugar and a high load of sugar. For example a  baked potato has a cup of sugar and a  french fry  2 teaspoons of sugar. Yams, wild  rice, whole grained bread &  wheat pasta have been much lower spike and load of  sugar. Portions should be the size of a deck of cards or your palm. Please  schedule fasting Labs in 10 weeks : BMET,Lipids, hepatic panel, A1c &  TSH. PLEASE BRING THESE INSTRUCTIONS TO FOLLOW UP  LAB APPOINTMENT.This will guarantee correct labs are drawn, eliminating need for repeat blood sampling ( needle sticks ! ). Diagnoses /Codes: 250.02, 244.9

## 2010-12-30 NOTE — Assessment & Plan Note (Signed)
Diabetes control is suboptimal; A1c is 7.5%. This would be associated with an average sugar of 169 and a 50% increased long-term risk of premature heart or stroke. Nutritional interventions and correcting hyperthyroidism will be pursued. Long-term he would be referred to get him off this if diarrhea because of potential cardiac issues with these agents. Ultimately insulin may need to be considered.

## 2010-12-30 NOTE — Assessment & Plan Note (Signed)
Creatinine is elevated at 1.25; GFR is 46 with normals of greater than 59. If diabetic control is not improved with nutrition and exercise; low dose metformin could be reconsidered.

## 2011-01-05 ENCOUNTER — Encounter: Payer: Self-pay | Admitting: Internal Medicine

## 2011-01-13 ENCOUNTER — Other Ambulatory Visit: Payer: Self-pay

## 2011-01-13 MED ORDER — GLIMEPIRIDE 2 MG PO TABS: ORAL_TABLET | ORAL | Status: DC

## 2011-01-13 MED ORDER — GLUCOSE BLOOD VI STRP: ORAL_STRIP | Status: DC

## 2011-01-13 NOTE — Telephone Encounter (Signed)
Rxs sent

## 2011-01-17 HISTORY — PX: OTHER SURGICAL HISTORY: SHX169

## 2011-01-23 ENCOUNTER — Ambulatory Visit (INDEPENDENT_AMBULATORY_CARE_PROVIDER_SITE_OTHER): Payer: Managed Care, Other (non HMO) | Admitting: Internal Medicine

## 2011-01-23 ENCOUNTER — Encounter: Payer: Self-pay | Admitting: Internal Medicine

## 2011-01-23 DIAGNOSIS — E039 Hypothyroidism, unspecified: Secondary | ICD-10-CM

## 2011-01-23 DIAGNOSIS — Z5189 Encounter for other specified aftercare: Secondary | ICD-10-CM

## 2011-01-23 DIAGNOSIS — J209 Acute bronchitis, unspecified: Secondary | ICD-10-CM

## 2011-01-23 MED ORDER — LEVOTHYROXINE SODIUM 25 MCG PO TABS: ORAL_TABLET | ORAL | Status: DC

## 2011-01-23 MED ORDER — AMOXICILLIN-POT CLAVULANATE 875-125 MG PO TABS: 1.0000 | ORAL_TABLET | Freq: Two times a day (BID) | ORAL | Status: AC

## 2011-01-23 NOTE — Patient Instructions (Signed)
Today's report will be faxed to your Cardiologist in [facility]

## 2011-01-23 NOTE — Progress Notes (Signed)
  Subjective:    Patient ID: [name], male    DOB: [birth year], 66 y.o.   MRN: [id number]  HPI 01/12/2011 a cardiac rhythm device was placed at [facility] percutaneously over the left chest. Since that time he denies fever, chills, sweats, or purulent drainage from the site. After the procedure he was placed on Keflex 250 mg 2 pills twice a day.  Over the pastmonth he has noted some yellow sputum and has had some mild morning frontal headache. He has had an intermittent sore throat with cough. He denies facial pain, nasal purulence, ear pain or discharge, or dental pain    Review of Systems fasting blood sugars are 113-140     Objective:   Physical Exam General appearance:good health ;well nourished; no acute distress or increased work of breathing is present.  No  lymphadenopathy about the head, neck, or axilla noted.   Eyes: No conjunctival inflammation or lid edema is present.   Ears:  External ear exam shows no significant lesions or deformities.  Otoscopic examination reveals clear canals, tympanic membranes are intact bilaterally without bulging, retraction, inflammation or discharge.  Nose:  External nasal examination shows no deformity or inflammation. Nasal mucosa are pink and moist without lesions or exudates. No septal dislocation or deviation.No obstruction to airflow.   Oral exam: Dental hygiene is good; lips and gums are healthy appearing.There is no oropharyngeal erythema or exudate noted.     Heart:  irregular rate and  rhythm. No gallop, murmur, click, rub or other extra sounds.   Lungs:Chest clear to auscultation; no wheezes, rhonchi,rales ,or rubs present.No increased work of breathing.    Extremities:  No cyanosis, edema, or clubbing  noted    Skin: Warm & dry ; the wound site is well healed with no erythema or drainage.         Assessment & Plan:  #1 wound site well healed without evidence of infection  #2 bronchitis  Plan: See orders  recommendation

## 2011-03-06 ENCOUNTER — Other Ambulatory Visit: Payer: Self-pay | Admitting: Internal Medicine

## 2011-03-13 ENCOUNTER — Other Ambulatory Visit: Payer: Self-pay | Admitting: *Deleted

## 2011-03-13 MED ORDER — ATENOLOL 100 MG PO TABS: 100.0000 mg | ORAL_TABLET | Freq: Every day | ORAL | Status: DC

## 2011-03-28 ENCOUNTER — Telehealth: Payer: Self-pay | Admitting: Internal Medicine

## 2011-03-28 NOTE — Telephone Encounter (Signed)
Please schedule fasting Labs in 10 weeks : BMET,Lipids, hepatic panel, A1c & TSH.  Diagnoses /Codes: 250.02, 244.9

## 2011-03-28 NOTE — Telephone Encounter (Signed)
Patients wife called to make appt for patient to have lab work. She states she thinks it is blood sugar and thyroid. What do I need to put in for him?

## 2011-03-31 NOTE — Telephone Encounter (Signed)
Spoke w/pt's wife. They will make lab appt closer to end of May when they know his work schedule.

## 2011-03-31 NOTE — Telephone Encounter (Signed)
Noted  

## 2011-04-06 ENCOUNTER — Other Ambulatory Visit: Payer: Managed Care, Other (non HMO)

## 2011-06-08 ENCOUNTER — Other Ambulatory Visit (INDEPENDENT_AMBULATORY_CARE_PROVIDER_SITE_OTHER): Payer: Managed Care, Other (non HMO)

## 2011-06-08 ENCOUNTER — Other Ambulatory Visit: Payer: Managed Care, Other (non HMO)

## 2011-06-08 DIAGNOSIS — IMO0001 Reserved for inherently not codable concepts without codable children: Secondary | ICD-10-CM

## 2011-06-08 DIAGNOSIS — E039 Hypothyroidism, unspecified: Secondary | ICD-10-CM

## 2011-06-08 LAB — BASIC METABOLIC PANEL
BUN: 22 mg/dL (ref 6–23)
CO2: 26 mEq/L (ref 19–32)
Calcium: 9.9 mg/dL (ref 8.4–10.5)
Chloride: 103 mEq/L (ref 96–112)
Creatinine, Ser: 1 mg/dL (ref 0.4–1.5)
GFR: 77.12 mL/min (ref >60.00)
Glucose, Bld: 181 mg/dL — ABNORMAL HIGH (ref 70–99)
Potassium: 4.5 mEq/L (ref 3.5–5.1)
Sodium: 142 mEq/L (ref 135–145)

## 2011-06-08 LAB — HEPATIC FUNCTION PANEL
ALT: 32 U/L (ref 0–53)
AST: 32 U/L (ref 0–37)
Albumin: 4.2 g/dL (ref 3.5–5.2)
Alkaline Phosphatase: 45 U/L (ref 39–117)
Bilirubin, Direct: 0.1 mg/dL (ref 0.0–0.3)
Total Bilirubin: 0.7 mg/dL (ref 0.3–1.2)
Total Protein: 7.1 g/dL (ref 6.0–8.3)

## 2011-06-08 LAB — LIPID PANEL
Cholesterol: 155 mg/dL (ref 0–200)
HDL: 41.9 mg/dL (ref >39.00)
LDL Cholesterol: 86 mg/dL (ref 0–99)
Total CHOL/HDL Ratio: 4
Triglycerides: 134 mg/dL (ref 0.0–149.0)
VLDL: 26.8 mg/dL (ref 0.0–40.0)

## 2011-06-08 LAB — TSH: TSH: 1.91 u[IU]/mL (ref 0.35–5.50)

## 2011-06-08 LAB — HEMOGLOBIN A1C: Hgb A1c MFr Bld: 8.7 % — ABNORMAL HIGH (ref 4.6–6.5)

## 2011-06-08 NOTE — Progress Notes (Signed)
Labs only

## 2011-06-09 LAB — MICROALBUMIN / CREATININE URINE RATIO
Creatinine,U: 322.5 mg/dL
Microalb Creat Ratio: 1.8 mg/g (ref 0.0–30.0)
Microalb, Ur: 5.9 mg/dL — ABNORMAL HIGH (ref 0.0–1.9)

## 2011-06-11 ENCOUNTER — Encounter: Payer: Self-pay | Admitting: Family Medicine

## 2011-07-27 ENCOUNTER — Ambulatory Visit (INDEPENDENT_AMBULATORY_CARE_PROVIDER_SITE_OTHER): Payer: Managed Care, Other (non HMO) | Admitting: Internal Medicine

## 2011-07-27 ENCOUNTER — Encounter: Payer: Self-pay | Admitting: Internal Medicine

## 2011-07-27 VITALS — BP 114/68 | HR 100 | Wt 211.2 lb

## 2011-07-27 DIAGNOSIS — E785 Hyperlipidemia, unspecified: Secondary | ICD-10-CM

## 2011-07-27 DIAGNOSIS — E039 Hypothyroidism, unspecified: Secondary | ICD-10-CM

## 2011-07-27 DIAGNOSIS — I10 Essential (primary) hypertension: Secondary | ICD-10-CM

## 2011-07-27 DIAGNOSIS — E119 Type 2 diabetes mellitus without complications: Secondary | ICD-10-CM

## 2011-07-27 MED ORDER — QUINAPRIL HCL 40 MG PO TABS: ORAL_TABLET | ORAL | Status: DC

## 2011-07-27 MED ORDER — METFORMIN HCL ER 500 MG PO TB24: ORAL_TABLET | ORAL | Status: DC

## 2011-07-27 MED ORDER — LEVOTHYROXINE SODIUM 25 MCG PO TABS: ORAL_TABLET | ORAL | Status: DC

## 2011-07-27 MED ORDER — LINAGLIPTIN 5 MG PO TABS: ORAL_TABLET | ORAL | Status: DC

## 2011-07-27 NOTE — Assessment & Plan Note (Addendum)
Blood pressure control is good on his present regimen; no change indicated

## 2011-07-27 NOTE — Assessment & Plan Note (Signed)
Nutrition will be stressed. As soon as he is cleared by his surgeon; a graduated exercise program is recommended. Low-dose metformin will be restarted as his renal function is now normal. A1c will be checked in 6 weeks

## 2011-07-27 NOTE — Patient Instructions (Addendum)
Please try to go on My Chart within the next 24 hours to allow me to release the results directly to you. PLEASE BRING THESE INSTRUCTIONS TO FOLLOW UP  LAB APPOINTMENT in SIX weeks for A1c, BUN , creat.This will guarantee correct labs are drawn, eliminating need for repeat blood sampling ( needle sticks ! ). Diagnoses /Codes: 250.02, 995.20

## 2011-07-27 NOTE — Progress Notes (Signed)
  Subjective:    Patient ID: [name], male    DOB: [birth year], 66 y.o.   MRN: [id number]  HPI DIABETES: Disease Monitoring: Blood Sugar ranges-FBS 115-178; no post prandial checks but he has had anorexia since surgery Polyuria/phagia/dipsia- no       Visual problems- no Medications: Compliance- yes  Hypoglycemic symptoms- no  HYPERTENSION: Disease Monitoring: Blood pressure range-not monitored@ home Medications: Compliance- yes  Lightheadedness,Syncope-slight lightheadedness    Edema- controlled with Lasix  HYPERLIPIDEMIA: Disease Monitoring: See symptoms for Hypertension Medications: Compliance- off statin > 24 months  Abd pain, bowel changes- no   Muscle aches- only chest area           Review of Systems He has been having intermittent piercing pain in the right thorax since his cardioversion and ablation procedure 07/11/11 at [facility]. This is nonexertional and nonpleuritic. It has occasional associated shortness of breath. The procedure required iatrogenic pneumothorax which was associated with shortness of breath for several days postoperatively.  He has occasional palpitations despite being on a beta blocker and the Tikosyn. The beta blocker was changed from atenolol to metoprolol and HCTZ was discontinued.  He is on prophylactic PPI; he denies any dyspepsia    Objective:   Physical Exam Gen.: well-nourished in appearance. Appropriate and cooperative throughout exam. He appears uncomfortable but in no acute distress  Eyes: No corneal or conjunctival inflammation noted.  Mouth: Oral mucosa and oropharynx reveal no lesions or exudates. Teeth in good repair. Neck: No deformities, masses, or tenderness noted.  Thyroid normal. Lungs: Normal respiratory effort; chest expands symmetrically. Lungs are clear to auscultation without rales, wheezes, or increased work of breathing. He tends to splint on the right. No rub is noted. Heart: Normal rate and rhythm.  Normal S1 and S2. No gallop, click, or rub. S 4 w/o murmur. Abdomen: Bowel sounds normal.                                                                                 Musculoskeletal/extremities:  No clubbing, cyanosis, edema, or deformity noted.  Nail health  good. Vascular: Carotid, radial artery, dorsalis pedis and  posterior tibial pulses are full and equal. No bruits present. Neurologic: Alert and oriented x3. Deep tendon reflexes symmetrical and normal.          Skin: Intact without suspicious lesions or rashes. Surgical wounds are well healed without signs of active cellulitis. Lymph: No cervical, axillary lymphadenopathy present. Psych: Mood and affect are normal. Normally interactive                                                                                         Assessment & Plan:

## 2011-07-27 NOTE — Assessment & Plan Note (Signed)
Lipid control is adequate; preferable is an LDL less than 70. Lipids will be monitored after diabetes control is improved

## 2011-08-09 ENCOUNTER — Encounter: Payer: Self-pay | Admitting: Internal Medicine

## 2011-08-18 ENCOUNTER — Encounter (INDEPENDENT_AMBULATORY_CARE_PROVIDER_SITE_OTHER): Payer: Managed Care, Other (non HMO) | Admitting: Ophthalmology

## 2011-08-18 DIAGNOSIS — E1165 Type 2 diabetes mellitus with hyperglycemia: Secondary | ICD-10-CM

## 2011-08-18 DIAGNOSIS — H43819 Vitreous degeneration, unspecified eye: Secondary | ICD-10-CM

## 2011-08-18 DIAGNOSIS — E1139 Type 2 diabetes mellitus with other diabetic ophthalmic complication: Secondary | ICD-10-CM

## 2011-08-18 DIAGNOSIS — H3530 Unspecified macular degeneration: Secondary | ICD-10-CM

## 2011-08-18 DIAGNOSIS — E11319 Type 2 diabetes mellitus with unspecified diabetic retinopathy without macular edema: Secondary | ICD-10-CM

## 2011-08-18 DIAGNOSIS — H35039 Hypertensive retinopathy, unspecified eye: Secondary | ICD-10-CM

## 2011-08-18 DIAGNOSIS — I10 Essential (primary) hypertension: Secondary | ICD-10-CM

## 2011-08-22 ENCOUNTER — Encounter: Payer: Self-pay | Admitting: Internal Medicine

## 2011-08-22 ENCOUNTER — Ambulatory Visit (INDEPENDENT_AMBULATORY_CARE_PROVIDER_SITE_OTHER): Payer: Managed Care, Other (non HMO) | Admitting: Internal Medicine

## 2011-08-22 VITALS — BP 128/80 | HR 112 | Temp 98.1°F | Wt 206.2 lb

## 2011-08-22 DIAGNOSIS — R000 Tachycardia, unspecified: Secondary | ICD-10-CM

## 2011-08-22 DIAGNOSIS — I10 Essential (primary) hypertension: Secondary | ICD-10-CM

## 2011-08-22 LAB — BASIC METABOLIC PANEL
BUN: 22 mg/dL (ref 6–23)
CO2: 26 mEq/L (ref 19–32)
Calcium: 9.7 mg/dL (ref 8.4–10.5)
Chloride: 102 mEq/L (ref 96–112)
Creatinine, Ser: 1 mg/dL (ref 0.4–1.5)
GFR: 82.6 mL/min (ref >60.00)
Glucose, Bld: 150 mg/dL — ABNORMAL HIGH (ref 70–99)
Potassium: 4.9 mEq/L (ref 3.5–5.1)
Sodium: 139 mEq/L (ref 135–145)

## 2011-08-22 LAB — TSH: TSH: 1.47 u[IU]/mL (ref 0.35–5.50)

## 2011-08-22 LAB — MAGNESIUM: Magnesium: 2 mg/dL (ref 1.5–2.5)

## 2011-08-22 NOTE — Progress Notes (Signed)
  Subjective:    Patient ID: [name], male    DOB: 06-14-1945, 66 y.o.   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

## 2011-08-22 NOTE — Patient Instructions (Addendum)
Review and correct the record as indicated. Please share record with all medical staff seen.  Please try to go on My Chart within the next 24 hours to allow me to release the results directly to you.  

## 2011-08-22 NOTE — Progress Notes (Signed)
  Subjective:    Patient ID: [name], male    DOB: [birth year], 66 y.o.   MRN: [id number]  HPI    Review of Systems     Objective:   Physical Exam        Assessment & Plan:

## 2011-08-22 NOTE — Progress Notes (Signed)
  Subjective:    Patient ID: [name], male    DOB: 02/12/1945, 66 y.o.   MRN: [id number]  HPI Over the last several weeks he has noted gradual increase in blood pressure up to a high of 147/87. In the last 2 days is noted some racing of his heart and changes in amplitude of the heartbeats, especially at night. He's had some exertional dyspnea.  He contacted his Thoracic Surgeon,Dr Harlene Ramus at [facility] who recommended assessment here    Review of Systems He denies chest pain, sustained palpitations, paroxysmal nocturnal dyspnea, claudication, or edema     Objective:   Physical Exam He appears healthy and well-nourished; he is in no acute distress  EOMI ; no lid lag or proptosis  Thyroid normal  No carotid bruits are present. No neck vein distention at 15  Irregular tachycardia with no significant murmurs ; gallop cadence.  Chest is clear with no increased work of breathing  There is no evidence of aortic aneurysm or renal artery bruits. No HJR. No organomegaly or masses He has no clubbing or edema.   Pedal pulses are intact   No ischemic skin changes are present   Deep tendon reflexes equal and normal. No tremor present        Assessment & Plan:  #1 recent increase in his blood pressure; today's blood pressure is well controlled. Despite the tachycardia; I am hesitant to increase his beta blocker from its present dose of 1 mg twice a day without consulting his cardiologist.  #2 tachycardia. EKG demonstrates a tachyarrhythmia. There appears to be a very short PR interval for  the most part but also possible AV dissociation is suggested. The V. leads suggests that there  could be intermittent when she got phenomena. This will need to be reviewed by his specialists @ [facility]. EKG will be faxed along with this record.

## 2011-08-31 ENCOUNTER — Telehealth: Payer: Self-pay

## 2011-08-31 NOTE — Telephone Encounter (Signed)
Spoke with patient and informed him that form dropped off by his wife is complete. Patient stated he will pick-up form. Copy sent to scanning and original placed at the front for pick-up   Side note: this was a form for work that requested recent B/P, Weigh, Height and lab values

## 2011-09-05 ENCOUNTER — Telehealth: Payer: Self-pay | Admitting: *Deleted

## 2011-09-05 NOTE — Telephone Encounter (Signed)
[facility] Triage Call Report Triage Record Num: [id number] Operator: Tarri Glenn Patient Name: [name] Call Date & Time: 09/02/2011 12:01:02PM Patient Phone: [phone] PCP: [name] Patient Gender: Male PCP Fax : [phone] Patient DOB: [birth year] Practice Name: [facility] Reason for Call: Caller: Nancy/Spouse; PCP: [name]; CB#: [phone]; Call regarding Animal Bite; Patient is calling about patient was walking into car repair shop 08/31/11 and was bit by a dog unknown to him. Dog bit patient on posterior left ankle and broke skin. Patient is concerned as had heart surgery 6 weeks ago. All emergent signs and symptoms ruled out with exception to any full thickness puncture wound per Bites-Animal or Human protocol. Advised patient to go to emergency room or urgent care for evaluation. Patient is unsure where he will go. Protocol(s) Used: Bites - Animal or Human Recommended Outcome per Protocol: See ED Immediately Reason for Outcome: Any full thickness puncture wound (passes through the skin layers of epidermis and dermis and penetrates into the underlying subcutaneous tissue) Care Advice: Write down provider's name. List or place the following in a bag for transport with the patient: current prescription and/or nonprescription medications; alternative treatments, therapies and medications; and street drugs.

## 2011-09-05 NOTE — Telephone Encounter (Signed)
Bite was ? 8/15; he could be seen here this afternoon if not already seen

## 2011-09-05 NOTE — Telephone Encounter (Signed)
Pt states he is seeing his cardiologist this week and has animal control involved regarding the incident. Patient states he feels like he has everything taken care of, and thanked you for your concern.

## 2011-09-08 ENCOUNTER — Encounter: Payer: Self-pay | Admitting: Internal Medicine

## 2011-09-11 ENCOUNTER — Telehealth: Payer: Self-pay | Admitting: Internal Medicine

## 2011-09-11 NOTE — Telephone Encounter (Signed)
Pt calling re set up a cardioversion

## 2011-09-11 NOTE — Telephone Encounter (Signed)
Discussed with dr Johney Frame, pt would like to have his DCCV next Tuesday. Will make arrangements and call the pt back.

## 2011-09-12 ENCOUNTER — Encounter: Payer: Self-pay | Admitting: *Deleted

## 2011-09-12 NOTE — Telephone Encounter (Signed)
Spoke with pt, DCCV scheduled for Friday 09-15-11 @ 2pm. He will come to the office tomorrow at 8:30am per dr allred for H&P and labs. Pt voiced understanding

## 2011-09-13 ENCOUNTER — Ambulatory Visit (INDEPENDENT_AMBULATORY_CARE_PROVIDER_SITE_OTHER): Payer: Managed Care, Other (non HMO) | Admitting: Internal Medicine

## 2011-09-13 ENCOUNTER — Encounter: Payer: Self-pay | Admitting: *Deleted

## 2011-09-13 ENCOUNTER — Other Ambulatory Visit: Payer: Self-pay | Admitting: *Deleted

## 2011-09-13 ENCOUNTER — Encounter: Payer: Self-pay | Admitting: Internal Medicine

## 2011-09-13 VITALS — BP 140/102 | HR 124 | Resp 18 | Ht 68.0 in | Wt 204.4 lb

## 2011-09-13 DIAGNOSIS — I10 Essential (primary) hypertension: Secondary | ICD-10-CM

## 2011-09-13 DIAGNOSIS — I4891 Unspecified atrial fibrillation: Secondary | ICD-10-CM

## 2011-09-13 DIAGNOSIS — Z01812 Encounter for preprocedural laboratory examination: Secondary | ICD-10-CM

## 2011-09-13 DIAGNOSIS — I4892 Unspecified atrial flutter: Secondary | ICD-10-CM

## 2011-09-13 DIAGNOSIS — G4733 Obstructive sleep apnea (adult) (pediatric): Secondary | ICD-10-CM

## 2011-09-13 LAB — CBC WITH DIFFERENTIAL/PLATELET
Basophils Absolute: 0.1 10*3/uL (ref 0.0–0.1)
Basophils Relative: 0.9 % (ref 0.0–3.0)
Eosinophils Absolute: 0.5 10*3/uL (ref 0.0–0.7)
Eosinophils Relative: 7.5 % — ABNORMAL HIGH (ref 0.0–5.0)
HCT: 41.8 % (ref 39.0–52.0)
Hemoglobin: 13.6 g/dL (ref 13.0–17.0)
Lymphocytes Relative: 16.2 % (ref 12.0–46.0)
Lymphs Abs: 1.1 10*3/uL (ref 0.7–4.0)
MCHC: 32.5 g/dL (ref 30.0–36.0)
MCV: 89 fl (ref 78.0–100.0)
Monocytes Absolute: 0.6 10*3/uL (ref 0.1–1.0)
Monocytes Relative: 9.6 % (ref 3.0–12.0)
Neutro Abs: 4.4 10*3/uL (ref 1.4–7.7)
Neutrophils Relative %: 65.8 % (ref 43.0–77.0)
Platelets: 200 10*3/uL (ref 150.0–400.0)
RBC: 4.7 Mil/uL (ref 4.22–5.81)
RDW: 14.9 % — ABNORMAL HIGH (ref 11.5–14.6)
WBC: 6.7 10*3/uL (ref 4.5–10.5)

## 2011-09-13 LAB — BASIC METABOLIC PANEL
BUN: 18 mg/dL (ref 6–23)
CO2: 26 mEq/L (ref 19–32)
Calcium: 9.5 mg/dL (ref 8.4–10.5)
Chloride: 105 mEq/L (ref 96–112)
Creatinine, Ser: 0.9 mg/dL (ref 0.4–1.5)
GFR: 96.18 mL/min (ref >60.00)
Glucose, Bld: 127 mg/dL — ABNORMAL HIGH (ref 70–99)
Potassium: 4.1 mEq/L (ref 3.5–5.1)
Sodium: 139 mEq/L (ref 135–145)

## 2011-09-13 NOTE — Patient Instructions (Addendum)
Your physician recommends that you schedule a follow-up appointment in: 3 months with Dr Johney Frame   Your physician has recommended that you have a Cardioversion (DCCV). Electrical Cardioversion uses a jolt of electricity to your heart either through paddles or wired patches attached to your chest. This is a controlled, usually prescheduled, procedure. Defibrillation is done under light anesthesia in the hospital, and you usually go home the day of the procedure. This is done to get your heart back into a normal rhythm. You are not awake for the procedure. Please see the instruction sheet given to you today.  This is scheduled for 09/15/11 with Dr Eden Emms at 2pm  Please check in at Short stay at 12:30   You can have a clear liquid breakfast but nothing after 8am

## 2011-09-15 ENCOUNTER — Encounter (HOSPITAL_COMMUNITY): Payer: Self-pay | Admitting: Anesthesiology

## 2011-09-15 ENCOUNTER — Ambulatory Visit (HOSPITAL_COMMUNITY): Payer: Managed Care, Other (non HMO) | Admitting: Anesthesiology

## 2011-09-15 ENCOUNTER — Encounter (HOSPITAL_COMMUNITY)
Admission: RE | Disposition: A | Discharge status: Home / Self Care | Payer: Self-pay | Source: Ambulatory Visit | Attending: Cardiovascular Disease

## 2011-09-15 ENCOUNTER — Encounter (HOSPITAL_COMMUNITY): Payer: Self-pay | Admitting: *Deleted

## 2011-09-15 ENCOUNTER — Ambulatory Visit (HOSPITAL_COMMUNITY)
Admission: RE | Admit: 2011-09-15 | Discharge: 2011-09-15 | Disposition: A | Discharge status: Home / Self Care | Payer: Managed Care, Other (non HMO) | Source: Ambulatory Visit | Attending: Cardiovascular Disease | Admitting: Cardiovascular Disease

## 2011-09-15 DIAGNOSIS — I4891 Unspecified atrial fibrillation: Secondary | ICD-10-CM | POA: Insufficient documentation

## 2011-09-15 DIAGNOSIS — I4892 Unspecified atrial flutter: Secondary | ICD-10-CM

## 2011-09-15 HISTORY — DX: Hypothyroidism, unspecified: E03.9

## 2011-09-15 HISTORY — PX: CARDIOVERSION: SHX1299

## 2011-09-15 LAB — GLUCOSE, CAPILLARY: Glucose-Capillary: 82 mg/dL (ref 70–99)

## 2011-09-15 SURGERY — CARDIOVERSION
Anesthesia: General

## 2011-09-15 MED ORDER — SODIUM CHLORIDE 0.9 % IV SOLN
INTRAVENOUS | Status: DC | PRN
  Administered 2011-09-15: 14:00:00 via INTRAVENOUS

## 2011-09-15 MED ORDER — SODIUM CHLORIDE 0.45 % IV SOLN
INTRAVENOUS | Status: DC
  Administered 2011-09-15: 500 mL via INTRAVENOUS

## 2011-09-15 NOTE — Transfer of Care (Signed)
Immediate Anesthesia Transfer of Care Note  Patient: [name]  Procedure(s) Performed: Procedure(s) (LRB): CARDIOVERSION (N/A)  Patient Location: PACU and Endoscopy Unit  Anesthesia Type: General  Level of Consciousness: awake, alert  and oriented  Airway & Oxygen Therapy: Patient Spontanous Breathing and Patient connected to nasal cannula oxygen  Post-op Assessment: Report given to PACU RN, Post -op Vital signs reviewed and stable and Patient moving all extremities X 4  Post vital signs: Reviewed and stable  Complications: No apparent anesthesia complications

## 2011-09-15 NOTE — Preoperative (Signed)
Beta Blockers   Reason not to administer Beta Blockers:Not Applicable 

## 2011-09-15 NOTE — H&P (View-Only) (Signed)
  Subjective:    Patient ID: [name], male    DOB: 04/05/1945, 65 y.o.   MRN: [id number]  HPI    Review of Systems     Objective:   Physical Exam        Assessment & Plan:   

## 2011-09-15 NOTE — Progress Notes (Signed)
1427 Pt cardioverted @ 150 joules to sinus rhythm.  See procedure notes re: details of procedure.

## 2011-09-15 NOTE — Interval H&P Note (Signed)
History and Physical Interval Note:  09/15/2011 1:47 PM  [name]  has presented today for surgery, with the diagnosis of afib  The various methods of treatment have been discussed with the patient and family. After consideration of risks, benefits and other options for treatment, the patient has consented to  Procedure(s) (LRB): CARDIOVERSION (N/A) as a surgical intervention .  The patient's history has been reviewed, patient examined, no change in status, stable for surgery.  I have reviewed the patient's chart and labs.  Questions were answered to the patient's satisfaction.     Charlton Haws

## 2011-09-15 NOTE — Anesthesia Postprocedure Evaluation (Signed)
  Anesthesia Post-op Note  Patient: [name]  Procedure(s) Performed: Procedure(s) (LRB): CARDIOVERSION (N/A)  Patient Location: PACU and Endoscopy Unit  Anesthesia Type: General  Level of Consciousness: awake and alert   Airway and Oxygen Therapy: Patient Spontanous Breathing  Post-op Pain: none  Post-op Assessment: Post-op Vital signs reviewed, Patient's Cardiovascular Status Stable, Respiratory Function Stable, Patent Airway, No signs of Nausea or vomiting and Pain level controlled  Post-op Vital Signs: stable  Complications: No apparent anesthesia complications

## 2011-09-15 NOTE — Anesthesia Preprocedure Evaluation (Addendum)
Anesthesia Evaluation  Patient identified by MRN, date of birth, ID band Patient awake    Reviewed: Allergy & Precautions, H&P , NPO status , Patient's Chart, lab work & pertinent test results, reviewed documented beta blocker date and time   Airway Mallampati: II TM Distance: <3 FB Neck ROM: Full    Dental  (+) Teeth Intact and Dental Advisory Given   Pulmonary sleep apnea ,  breath sounds clear to auscultation        Cardiovascular hypertension, Pt. on home beta blockers and Pt. on medications Rhythm:Irregular Rate:Normal + Systolic murmurs    Neuro/Psych    GI/Hepatic   Endo/Other  Diabetes mellitus-, Type 2, Oral Hypoglycemic AgentsHypothyroidism   Renal/GU      Musculoskeletal   Abdominal   Peds  Hematology   Anesthesia Other Findings   Reproductive/Obstetrics                           Anesthesia Physical Anesthesia Plan  ASA: III  Anesthesia Plan: General   Post-op Pain Management:    Induction: Intravenous  Airway Management Planned: Mask  Additional Equipment:   Intra-op Plan:   Post-operative Plan:   Informed Consent: I have reviewed the patients History and Physical, chart, labs and discussed the procedure including the risks, benefits and alternatives for the proposed anesthesia with the patient or authorized representative who has indicated his/her understanding and acceptance.   Dental Advisory Given  Plan Discussed with: CRNA and Surgeon  Anesthesia Plan Comments:        Anesthesia Quick Evaluation

## 2011-09-15 NOTE — CV Procedure (Signed)
80mg  of Propofol Initial rhythm aflutter rate 113 DCC x1 120 J biphasic  Converted to NSR rate 55  No immediate neurologic sequelae On Rx xarelto

## 2011-09-18 ENCOUNTER — Encounter: Payer: Self-pay | Admitting: Internal Medicine

## 2011-09-18 NOTE — Assessment & Plan Note (Signed)
Mr Matarazzo's afib has been quite difficult to control and has been refractory to multiple procedures including PVI x 2 by me.  He is recently s/p convergent procedure 6/13.  Today, he presents with atypical atrial flutter.  As he remains within the early healing phases of his procedure, I have encouraged the patient to remain hopeful.  We will proceed with cardioversion.  He is appropriately anticoagulated at this time with xarelto.  Risks, benefits, and alternatives to cardioversion were discussed at length with the patient today including reaction to anesthesia and cardioemboli.  The patient accepts these risks and wishes to proceed.  We will therefore arrange cardioversion at the next available time. He should continue to follow closely with [facility].  I will see him back in 2-3 months.

## 2011-09-18 NOTE — Assessment & Plan Note (Signed)
Stable No change required today  

## 2011-09-18 NOTE — Progress Notes (Signed)
PCP:  [name], MD  The patient presents today for electrophysiology followup.  He has not been seen by me in some time. Since last being seen in our clinic, the patient was referred and underwent Convergent procedure at [facility].  He reports slow recovery from the procedure.  He recently went to [facility] for follow-up and was found to have atypical atrial flutter.  He was instructed to follow-up with me to arrange cardioversion.  He is adequately anticoagulation.  He presently reports fatigue and rare palpitations with his atrial flutter.  Today, he denies symptoms of pchest pain, shortness of breath, orthopnea, PND, lower extremity edema, dizziness, presyncope, syncope, or neurologic sequela.  The patient feels that he is tolerating medications without difficulties and is otherwise without complaint today.   Past Medical History  Diagnosis Date  . Renal calculus 2005  . Diabetes mellitus     type 2  . Hyperlipidemia   . Hypertension   . AF (atrial fibrillation)     persistent afib, s/p afib ablation x 2 with subsequent convergent procedure at [facility]  . Allergic rhinitis   . Hypothyroidism    Past Surgical History  Procedure Date  . Cardioversion   . Cardiac catheterization 2005    non obstructive CAD  . Afib 09/22/2008--11/03/2009     afib ablation x 2 by Dr Johney Frame with subsequent convergent procedure at [facility]  . 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 Cardioversion 12/09/2010    Procedure: CARDIOVERSION;  Surgeon: Marca Ancona, MD;  Location: [facility] OR;  Service: Cardiovascular;  Laterality: N/A;  . Joint replacement   . Tonsillectomy     Current Outpatient Prescriptions  Medication Sig Dispense Refill  . AMBULATORY NON FORMULARY MEDICATION OCUVITE, 1 by mouth daily      . dofetilide (TIKOSYN) 250 MCG capsule Take 250 mcg by mouth 2 (two) times daily.      Marland Kitchen glimepiride (AMARYL) 2 MG tablet Take 2 mg by mouth. 1/2 by mouth daily      . glucose blood  (ONE TOUCH ULTRA TEST) test strip Check blood sugar once daily DX: 250.00  100 each  12  . levothyroxine (SYNTHROID, LEVOTHROID) 25 MCG tablet 1 pill Monday, Wednesday, Friday, and Sunday. One & one  half pills on Tuesday, Thursday, and Saturday.  90 tablet  1  . linagliptin (TRADJENTA) 5 MG TABS tablet TAKE 1 TABLET BY MOUTH EVERY DAY  90 tablet  1  . metFORMIN (GLUCOPHAGE XR) 500 MG 24 hr tablet 1 twice a day with breakfast and evening meal  60 tablet  2  . metoprolol (LOPRESSOR) 100 MG tablet Take 100 mg by mouth 2 (two) times daily. Take 200 mg at breakfast & 100 mg in evening.      . Multiple Vitamin (MULTIVITAMIN) capsule Take 1 capsule by mouth daily.        . NON FORMULARY Inject 1 vial as directed every 30 (thirty) days. Allergy injections taken twice a month      . quinapril (ACCUPRIL) 40 MG tablet TAKE 1 TABLET DAILY  90 tablet  1  . Rivaroxaban (XARELTO) 20 MG TABS Take 20 mg by mouth daily.  30 tablet  11    No Known Allergies  History   Social History  . Marital Status: Married    Spouse Name: [name]    Number of Children: Y  . Years of Education: N/A   Occupational History  . BUYER  Social History Main Topics  . Smoking status: Former Smoker -- 0.9 packs/day for 8 years    Types: Cigarettes    Quit date: 01/16/1974  . Smokeless tobacco: Not on file  . Alcohol Use: Yes     Social  . Drug Use: No  . Sexually Active: Not on file   Other Topics Concern  . Not on file   Social History Narrative  . No narrative on file    Family History  Problem Relation Age of Onset  . Lung cancer Mother   . Diabetes Father   . Testicular cancer Brother   . Allergies Maternal Grandmother   . Allergies Brother   . Cancer Maternal Grandmother     unsure    ROS-  All systems are reviewed and are negative except as outlined in the HPI above   Physical Exam: Filed Vitals:   09/13/11 0847  BP: 140/102  Pulse: 124  Resp: 18  Height: 5\' 8"  (1.727 m)  Weight: 204 lb 6.4  oz (92.715 kg)  SpO2: 96%    GEN- The patient is well appearing, alert and oriented x 3 today.   Head- normocephalic, atraumatic Eyes-  Sclera clear, conjunctiva pink Ears- hearing intact Oropharynx- clear Neck- supple, no JVP Lymph- no cervical lymphadenopathy Lungs- Clear to ausculation bilaterally, normal work of breathing Heart- irregular rate and rhythm, no murmurs, rubs or gallops, PMI not laterally displaced GI- soft, NT, ND, + BS Extremities- no clubbing, cyanosis, or edema MS- no significant deformity or atrophy Skin- no rash or lesion Psych- euthymic mood, full affect Neuro- strength and sensation are intact  ekg today reveals atypical atrial flutter V rate 123 bpm, incomplete RBBB  Assessment and Plan:

## 2011-09-18 NOTE — Assessment & Plan Note (Signed)
Compliance with CPAP advised 

## 2011-09-19 ENCOUNTER — Encounter (HOSPITAL_COMMUNITY): Payer: Self-pay | Admitting: Cardiovascular Disease

## 2011-09-22 NOTE — Addendum Note (Signed)
Addended by: Lacie Scotts on: 09/22/2011 03:43 PM   Modules accepted: Orders

## 2011-10-04 ENCOUNTER — Other Ambulatory Visit (INDEPENDENT_AMBULATORY_CARE_PROVIDER_SITE_OTHER): Payer: Managed Care, Other (non HMO)

## 2011-10-04 DIAGNOSIS — E119 Type 2 diabetes mellitus without complications: Secondary | ICD-10-CM

## 2011-10-04 LAB — HEMOGLOBIN A1C: Hgb A1c MFr Bld: 6 % (ref 4.6–6.5)

## 2011-10-06 ENCOUNTER — Other Ambulatory Visit: Payer: Self-pay | Admitting: Internal Medicine

## 2011-10-06 ENCOUNTER — Other Ambulatory Visit: Payer: Self-pay

## 2011-10-06 DIAGNOSIS — E119 Type 2 diabetes mellitus without complications: Secondary | ICD-10-CM

## 2011-10-06 MED ORDER — METFORMIN HCL ER 500 MG PO TB24: ORAL_TABLET | ORAL | Status: DC

## 2011-10-06 NOTE — Telephone Encounter (Signed)
Pt going out of town and needed a refill of metformin.  He is going to see Dr. [name] prior to more refills per last result note.

## 2011-11-14 ENCOUNTER — Other Ambulatory Visit: Payer: Self-pay | Admitting: Internal Medicine

## 2011-11-14 NOTE — Telephone Encounter (Signed)
Spoke with patient, per Dr.Hopper he is due for an appointment prior to refilling any medications. Patient states he will check his schedule and call back to schedule. Patient states he thinks he has 1-2 weeks left of metformin to last until he gets in for appointment

## 2011-11-20 ENCOUNTER — Ambulatory Visit (INDEPENDENT_AMBULATORY_CARE_PROVIDER_SITE_OTHER): Payer: Managed Care, Other (non HMO) | Admitting: Internal Medicine

## 2011-11-20 ENCOUNTER — Encounter: Payer: Self-pay | Admitting: Internal Medicine

## 2011-11-20 VITALS — BP 112/78 | HR 77 | Wt 208.0 lb

## 2011-11-20 DIAGNOSIS — R350 Frequency of micturition: Secondary | ICD-10-CM

## 2011-11-20 DIAGNOSIS — R609 Edema, unspecified: Secondary | ICD-10-CM

## 2011-11-20 DIAGNOSIS — M791 Myalgia, unspecified site: Secondary | ICD-10-CM

## 2011-11-20 DIAGNOSIS — IMO0001 Reserved for inherently not codable concepts without codable children: Secondary | ICD-10-CM

## 2011-11-20 DIAGNOSIS — E119 Type 2 diabetes mellitus without complications: Secondary | ICD-10-CM

## 2011-11-20 LAB — POCT URINALYSIS DIPSTICK
Bilirubin, UA: NEGATIVE
Blood, UA: NEGATIVE
Glucose, UA: NEGATIVE
Ketones, UA: NEGATIVE
Leukocytes, UA: NEGATIVE
Nitrite, UA: NEGATIVE
Protein, UA: NEGATIVE
Spec Grav, UA: 1.02
Urobilinogen, UA: 0.2
pH, UA: 6.5

## 2011-11-20 MED ORDER — METFORMIN HCL ER 500 MG PO TB24: ORAL_TABLET | ORAL | Status: DC

## 2011-11-20 MED ORDER — LINAGLIPTIN 5 MG PO TABS: ORAL_TABLET | ORAL | Status: DC

## 2011-11-20 NOTE — Progress Notes (Signed)
Subjective:    Patient ID: [name], male    DOB: 1945-04-13, 66 y.o.   MRN: [id number]  HPI  In May of this year his A1c was 8.7% indicating extremely poorly controlled diabetes. That would correlate with an average sugar of 203 and increased risk of 74%. In Sept his A1c was 6 indicating average sugar 126 and a 20% long-term risk  Diabetes status assessment: Fasting or morning glucose range:  117-135 Highest glucose 2 hours after any meal:  Not checked. Hypoglycemia :  no .                                                                                                                                                                                                                                                        Exercise : minimal . Nutrition/diet:  No specific plan. Medication compliance : yes. Medication adverse  Effects:  ? Thigh pain from meds .Edema bilaterally ; he is on CCB Eye exam : 2 mos ago; no retinopathy but some Glaucoma. Foot care : no.      Review of Systems Excess thirst :no;  Excess hunger: no ;  Excess urination: only nocturia up to 3-4.   Lightheadedness with standing: no. Chest pain: no ; Palpitations :no ;  Pain in  calves with walking:  No but in lower thighs .  Non healing skin  ulcers or sores,especially over the feet:  no. Numbness or tingling or burning in feet : no .                                                                                                                                            Significant change  in  Weight : stable. Vision changes : no  .       Objective:   Physical Exam Gen.:  well-nourished in appearance. Alert, appropriate and cooperative throughout exam. Eyes: No corneal or conjunctival inflammation noted.  Neck: No deformities, masses, or tenderness noted.  Thyroid normal.No NVD @ 15 degrees Lungs: Normal respiratory effort; chest expands symmetrically. Lungs are clear to auscultation without rales, wheezes,  or increased work of breathing. Heart: Normal rate and rhythm. Normal S1 and S2. No gallop, click, or rub. No murmur. Abdomen: Bowel sounds normal; abdomen soft and nontender. No masses, organomegaly or hernias noted. No HJR                                                                           Musculoskeletal/extremities:  No clubbing, cyanosis,  or deformity noted. Tone & strength  normal.Joints normal. Nail health  Good.1/2-1 + edema Vascular: Carotid, radial artery, dorsalis pedis and  posterior tibial pulses are full and equal. No bruits present. Neurologic: Alert and oriented x3. Deep tendon reflexes symmetrical and normal.          Skin: Intact without suspicious lesions or rashes.Deeply tanned Lymph: No cervical, axillary lymphadenopathy present. Psych: Mood and affect are normal. Normally interactive                                                                                         Assessment & Plan:   #1 diabetes; A1c is now at the nondiabetic range. Glimiperide will be discontinued. Trajenta will be decreased to 1/2 daily  #2 edema; hepatorenal function should be checked. The edema may be related to his amlodipine. I defer to Dr. Lennox Solders as to whether this can be weaned or discontinued. At this time his pressure is well controlled  #3 thigh pain; CK should be checked along with electrolytes

## 2011-11-20 NOTE — Patient Instructions (Addendum)
Please  schedule fasting Labs : BMET,CK, hepatic panel,  TSH. PLEASE BRING THESE INSTRUCTIONS TO FOLLOW UP  LAB APPOINTMENT.This will guarantee correct labs are drawn, eliminating need for repeat blood sampling ( needle sticks ! ). Diagnoses /Codes: 782.3,729.1(edema, myalgia). If labs are normal; consideration can be given to weaning or discontinuing the amlodipine. This agent can cause significant edema.

## 2011-11-24 ENCOUNTER — Other Ambulatory Visit (INDEPENDENT_AMBULATORY_CARE_PROVIDER_SITE_OTHER): Payer: Managed Care, Other (non HMO)

## 2011-11-24 DIAGNOSIS — IMO0001 Reserved for inherently not codable concepts without codable children: Secondary | ICD-10-CM

## 2011-11-24 DIAGNOSIS — R609 Edema, unspecified: Secondary | ICD-10-CM

## 2011-11-24 LAB — HEPATIC FUNCTION PANEL
ALT: 25 U/L (ref 0–53)
AST: 22 U/L (ref 0–37)
Albumin: 4.2 g/dL (ref 3.5–5.2)
Alkaline Phosphatase: 54 U/L (ref 39–117)
Bilirubin, Direct: 0 mg/dL (ref 0.0–0.3)
Total Bilirubin: 0.4 mg/dL (ref 0.3–1.2)
Total Protein: 7.5 g/dL (ref 6.0–8.3)

## 2011-11-24 LAB — BASIC METABOLIC PANEL
BUN: 21 mg/dL (ref 6–23)
CO2: 28 mEq/L (ref 19–32)
Calcium: 9.8 mg/dL (ref 8.4–10.5)
Chloride: 106 mEq/L (ref 96–112)
Creatinine, Ser: 1 mg/dL (ref 0.4–1.5)
GFR: 81.56 mL/min (ref >60.00)
Glucose, Bld: 154 mg/dL — ABNORMAL HIGH (ref 70–99)
Potassium: 5 mEq/L (ref 3.5–5.1)
Sodium: 142 mEq/L (ref 135–145)

## 2011-11-24 LAB — CK: Total CK: 69 U/L (ref 7–232)

## 2011-11-24 LAB — TSH: TSH: 1.34 u[IU]/mL (ref 0.35–5.50)

## 2011-11-27 ENCOUNTER — Encounter: Payer: Self-pay | Admitting: Internal Medicine

## 2012-01-19 ENCOUNTER — Other Ambulatory Visit: Payer: Self-pay | Admitting: Internal Medicine

## 2012-02-16 ENCOUNTER — Ambulatory Visit (INDEPENDENT_AMBULATORY_CARE_PROVIDER_SITE_OTHER): Payer: BC Managed Care – PPO | Admitting: Internal Medicine

## 2012-02-16 ENCOUNTER — Encounter: Payer: Self-pay | Admitting: Internal Medicine

## 2012-02-16 VITALS — BP 138/80 | HR 85 | Temp 98.1°F | Resp 12 | Ht 68.0 in | Wt 211.0 lb

## 2012-02-16 DIAGNOSIS — E119 Type 2 diabetes mellitus without complications: Secondary | ICD-10-CM

## 2012-02-16 DIAGNOSIS — E039 Hypothyroidism, unspecified: Secondary | ICD-10-CM

## 2012-02-16 DIAGNOSIS — I10 Essential (primary) hypertension: Secondary | ICD-10-CM

## 2012-02-16 DIAGNOSIS — Z0000 Encounter for general adult medical examination without abnormal findings: Secondary | ICD-10-CM

## 2012-02-16 LAB — CBC WITH DIFFERENTIAL/PLATELET
Basophils Absolute: 0 10*3/uL (ref 0.0–0.1)
Basophils Relative: 0.7 % (ref 0.0–3.0)
Eosinophils Absolute: 0.6 10*3/uL (ref 0.0–0.7)
Eosinophils Relative: 8.7 % — ABNORMAL HIGH (ref 0.0–5.0)
HCT: 43.7 % (ref 39.0–52.0)
Hemoglobin: 14.9 g/dL (ref 13.0–17.0)
Lymphocytes Relative: 17.8 % (ref 12.0–46.0)
Lymphs Abs: 1.3 10*3/uL (ref 0.7–4.0)
MCHC: 34.1 g/dL (ref 30.0–36.0)
MCV: 86.8 fl (ref 78.0–100.0)
Monocytes Absolute: 0.7 10*3/uL (ref 0.1–1.0)
Monocytes Relative: 9.2 % (ref 3.0–12.0)
Neutro Abs: 4.7 10*3/uL (ref 1.4–7.7)
Neutrophils Relative %: 63.6 % (ref 43.0–77.0)
Platelets: 208 10*3/uL (ref 150.0–400.0)
RBC: 5.03 Mil/uL (ref 4.22–5.81)
RDW: 14.5 % (ref 11.5–14.6)
WBC: 7.4 10*3/uL (ref 4.5–10.5)

## 2012-02-16 LAB — LIPID PANEL
Cholesterol: 148 mg/dL (ref 0–200)
HDL: 41.6 mg/dL (ref >39.00)
LDL Cholesterol: 81 mg/dL (ref 0–99)
Total CHOL/HDL Ratio: 4
Triglycerides: 125 mg/dL (ref 0.0–149.0)
VLDL: 25 mg/dL (ref 0.0–40.0)

## 2012-02-16 LAB — HEPATIC FUNCTION PANEL
ALT: 25 U/L (ref 0–53)
AST: 24 U/L (ref 0–37)
Albumin: 4.4 g/dL (ref 3.5–5.2)
Alkaline Phosphatase: 52 U/L (ref 39–117)
Bilirubin, Direct: 0 mg/dL (ref 0.0–0.3)
Total Bilirubin: 0.6 mg/dL (ref 0.3–1.2)
Total Protein: 7.6 g/dL (ref 6.0–8.3)

## 2012-02-16 LAB — BASIC METABOLIC PANEL
BUN: 21 mg/dL (ref 6–23)
CO2: 27 mEq/L (ref 19–32)
Calcium: 9.3 mg/dL (ref 8.4–10.5)
Chloride: 102 mEq/L (ref 96–112)
Creatinine, Ser: 0.9 mg/dL (ref 0.4–1.5)
GFR: 89.92 mL/min (ref >60.00)
Glucose, Bld: 153 mg/dL — ABNORMAL HIGH (ref 70–99)
Potassium: 4.5 mEq/L (ref 3.5–5.1)
Sodium: 137 mEq/L (ref 135–145)

## 2012-02-16 LAB — MICROALBUMIN / CREATININE URINE RATIO
Creatinine,U: 180.4 mg/dL
Microalb Creat Ratio: 2.7 mg/g (ref 0.0–30.0)
Microalb, Ur: 4.9 mg/dL — ABNORMAL HIGH (ref 0.0–1.9)

## 2012-02-16 LAB — HEMOGLOBIN A1C: Hgb A1c MFr Bld: 7.6 % — ABNORMAL HIGH (ref 4.6–6.5)

## 2012-02-16 MED ORDER — AMLODIPINE BESYLATE 10 MG PO TABS: 10.0000 mg | ORAL_TABLET | Freq: Every day | ORAL | Status: DC

## 2012-02-16 MED ORDER — LEVOTHYROXINE SODIUM 25 MCG PO TABS: ORAL_TABLET | ORAL | Status: DC

## 2012-02-16 MED ORDER — METFORMIN HCL ER 500 MG PO TB24: ORAL_TABLET | ORAL | Status: DC

## 2012-02-16 MED ORDER — QUINAPRIL HCL 40 MG PO TABS: ORAL_TABLET | ORAL | Status: DC

## 2012-02-16 NOTE — Patient Instructions (Addendum)
Preventive Health Care: Exercise at least 30-45 minutes a day,  3-4 days a week.  Eat a low-fat diet with lots of fruits and vegetables, up to 7-9 servings per day.  Consume less than 40 grams of sugar per day from foods & drinks with High Fructose Corn Sugar as #1,2,3 or # 4 on label. Health Care Power of Attorney & Living Will. Complete if not in place ; these place you in charge of your health care decisions. Minimal Blood Pressure Goal= AVERAGE < 140/90;  Ideal is an AVERAGE < 135/85. This AVERAGE should be calculated from @ least 5-7 BP readings taken @ different times of day on different days of week. You should not respond to isolated BP readings , but rather the AVERAGE for that week .Please bring your  blood pressure cuff to office visits to verify that it is reliable.It  can also be checked against the blood pressure device at the pharmacy. Finger or wrist cuffs are not dependable; an arm cuff is.  Use an anti-inflammatory cream such as Aspercreme or Zostrix cream twice a day to the left thigh as needed. In lieu of this warm moist compresses or  hot water bottle can be used. Do not apply ice.The best exercises for the thigh include freestyle or breast stroke  swimming, stretch aerobics, and  Cybex or Nautilus machines . To prevent palpitations or premature beats, avoid stimulants such as decongestants, diet pills, nicotine, or caffeine (coffee, tea, cola, or chocolate) to excess.

## 2012-02-16 NOTE — Progress Notes (Signed)
Subjective:    Patient ID: [name], male    DOB: 03-11-1945, 67 y.o.   MRN: [id number]  HPI  [name] is here for a physical;acute issues include L thigh pain .      Review of Systems The pain began several months ago  in L superior thigh  without associated injury or trigger . It is described as dull to sharp up to level 6 The pain does not radiate. The discomfort last only while thigh elevated . There has been no associated  redness ,,swelling stiffness, skin color change, or temperature change. The pain has not been treated .   Negative or absent signs& symptoms are delineated below: Constitutional: no fever, chills, sweats,significant change in weight  Musculoskeletal:occasional calf/foot   cramping pain Skin:no rash in thigh Neuro: no weakness; incontinence (stool/urine); numbness and tingling Heme:no lymphadenopathy; abnormal bruising or bleeding    HYPERTENSION: Disease Monitoring: Blood pressure range 120/76-144/88 Compliant with present antihypertensive regimen   DIABETES /PMH of FASTING HYPERGLYCEMIA : Disease Monitoring: Blood Sugar: ranges 130-170, usually 130-150; < 190 post meal Medication Compliance: yes ; sulfonylurea had been D/Ced Diet: modified low salt  Exercise : none   Ophth exam current Podiatry exam not current  Hypoglycemic symptoms:no  HYPERLIPIDEMIA: Diet & exercise as noted Medication Compliance:  No Statin taken  No FH premature CAD / CVA   Past medical history/family history/social history were all reviewed and updated.    Signs & symptoms not present or negative as noted below: No chest pain, palpitations , claudications, PNDyspnea    No exertional dyspnea No lightheadedness or syncope   No polyuria/phagia/dipsia No non healing skin lesions      No blurred , double or loss of vision No abd pain, bowel changes                                                                                                 Objective:   Physical Exam Gen.:  well-nourished in appearance. Alert, appropriate and cooperative throughout exam. Appears younger than stated age  Head: Normocephalic without obvious abnormalities;  no alopecia  Eyes: No corneal or conjunctival inflammation noted. Pupils equal round reactive to light and accommodation.  Extraocular motion intact. Vision grossly normal. Ears: External  ear exam reveals no significant lesions or deformities. Canals clear .TMs normal. Hearing is grossly normal bilaterally. Nose: External nasal exam reveals no deformity or inflammation. Nasal mucosa are pink and moist. No lesions or exudates noted.   Mouth: Oral mucosa and oropharynx reveal no lesions or exudates. Teeth in good repair. Neck: No deformities, masses, or tenderness noted. Range of motion & Thyroid normal. Lungs: Normal respiratory effort; chest expands symmetrically. Lungs are clear to auscultation without rales, wheezes, or increased work of breathing. Heart: Normal rate and rhythm. Accentuated S1.Normal S 2. No gallop, click, or rub.No murmur. Abdomen: Bowel sounds normal; abdomen soft and nontender. No masses, organomegaly or hernias noted. Genitalia: Genitalia normal . Prostate is normal without enlargement, asymmetry, nodularity, or induration.  Musculoskeletal/extremities:There is some asymmetry of the posterior thoracic musculature suggesting occult scoliosis.  No clubbing, cyanosis, edema, or significant extremity  deformity noted. Range of motion normal .Tone & strength  normal.Joints normal.Mild crepitus knees. Nail health good. Able to lie down & sit up w/o help. Negative SLR bilaterally. With elevation of the thigh and external rotation he has discomfort in the anterior thigh area. There's no tenderness to percussion over the hip Vascular: Carotid, radial artery, dorsalis pedis and  posterior tibial pulses are full and equal. No bruits present. Neurologic:  Alert and oriented x3. Deep tendon reflexes symmetrical but 0-1/2+.  Skin: Intact without suspicious lesions or rashes. Lymph: No cervical, axillary, or inguinal lymphadenopathy present. Psych: Mood and affect are normal. Normally interactive                                                                                        Assessment & Plan:  #1 comprehensive physical exam; no acute findings  Plan: see Orders  & Recommendations

## 2012-03-03 ENCOUNTER — Encounter: Payer: Self-pay | Admitting: Internal Medicine

## 2012-03-05 ENCOUNTER — Other Ambulatory Visit: Payer: Self-pay | Admitting: *Deleted

## 2012-03-05 DIAGNOSIS — E119 Type 2 diabetes mellitus without complications: Secondary | ICD-10-CM

## 2012-03-05 MED ORDER — METFORMIN HCL ER 500 MG PO TB24: ORAL_TABLET | ORAL | Status: DC

## 2012-03-05 NOTE — Telephone Encounter (Signed)
Refill for [facility] with new dosage per pt request. New directions: take 1000 mg  BID AC

## 2012-05-11 ENCOUNTER — Other Ambulatory Visit: Payer: Self-pay | Admitting: Internal Medicine

## 2012-06-06 IMAGING — CT CT HEART MORPH/PULM VEIN W/ CM & W/O CA SCORE
1 of 2 series · 15 of 20 positions shown, 19 images · non-contrast
Comparison: none

Cardiac CT:
INDICATION: Pre Atrial Fib Ablation
PROTOCOL: The patient was scanned on a Siemens Sensation 64 slice
scanner.  A 120 kV retrospective dose modulated protocol was used.
The patient was in rapid atrial fibrillation on arrival.  20mg of
iv lopressor were given.  A timing bolus of 20cc was used with Muhajir
MOH in the LA.  80cc of contrast was used for CTA

[Series 10: 70% only · axial · 0.49mm/px · z∈[-57,+55]mm · 15 of 318 slices shown, 19 images]
[im 19/318  vessel]
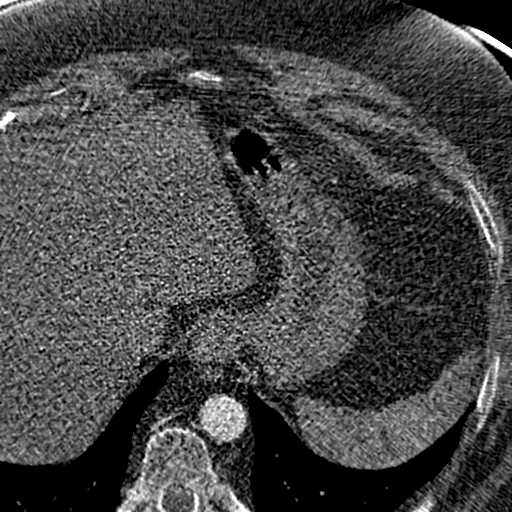
[im 19/318  lung]
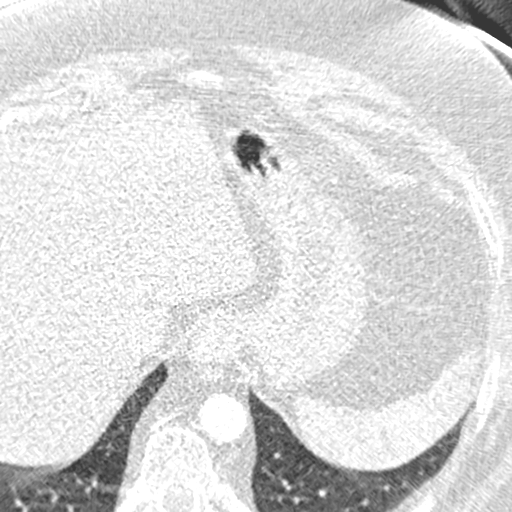
[im 38/318  vessel]
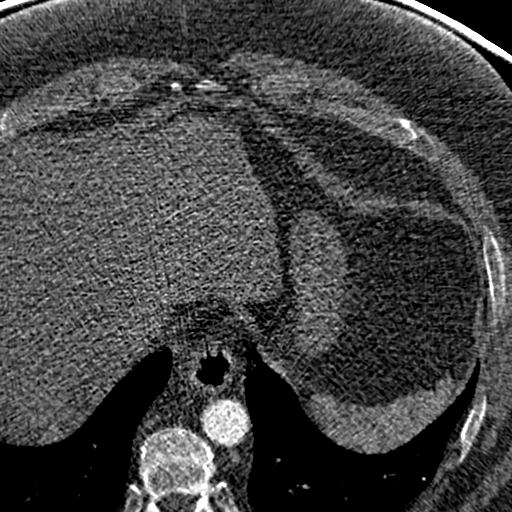
[im 56/318  vessel]
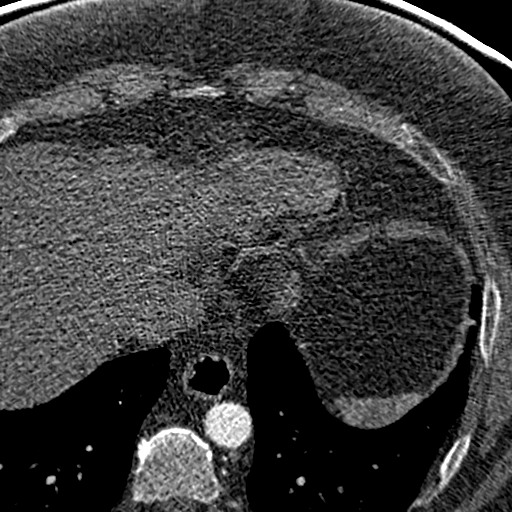
[im 75/318  vessel]
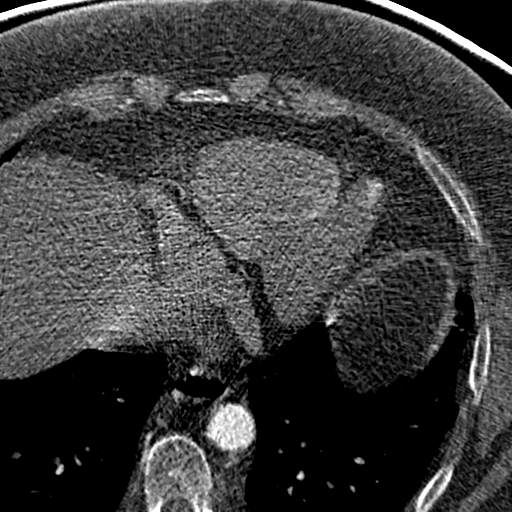
[im 94/318  vessel]
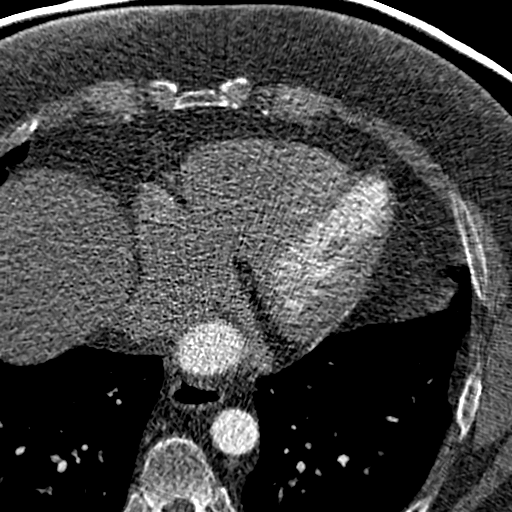
[im 94/318  lung]
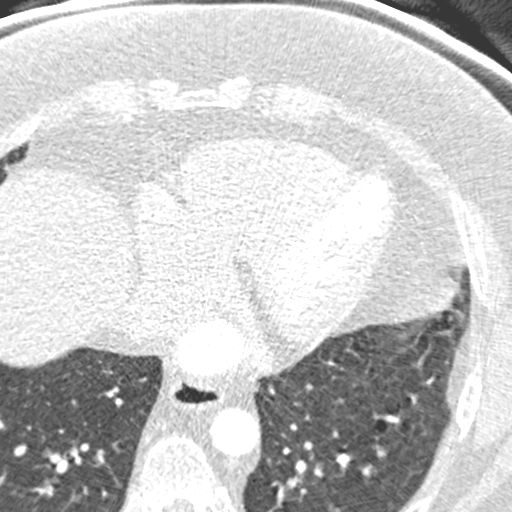
[im 112/318  vessel]
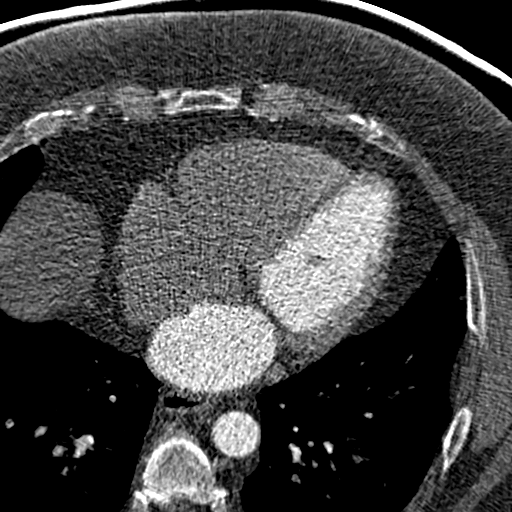
[im 131/318  vessel]
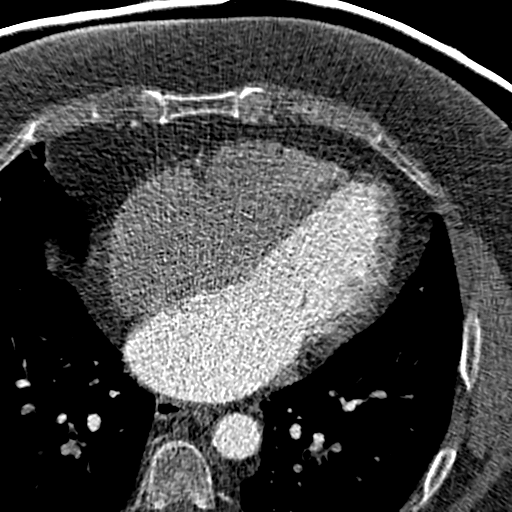
[im 168/318  vessel]
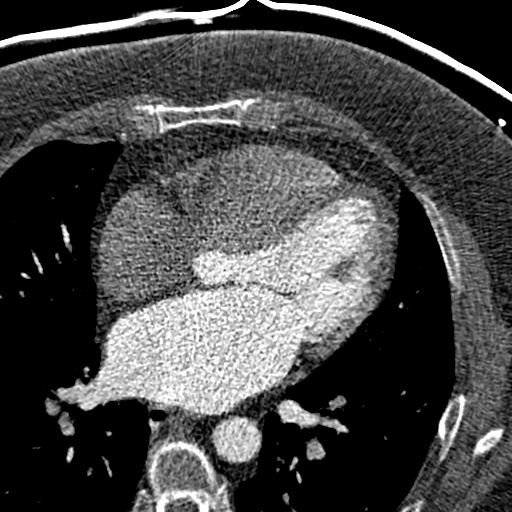
[im 187/318  vessel]
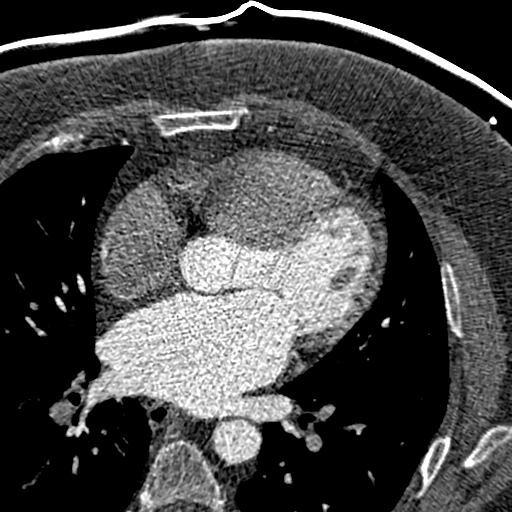
[im 187/318  lung]
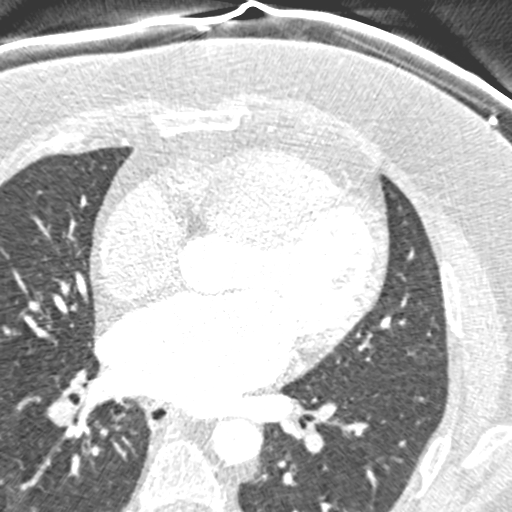
[im 206/318  vessel]
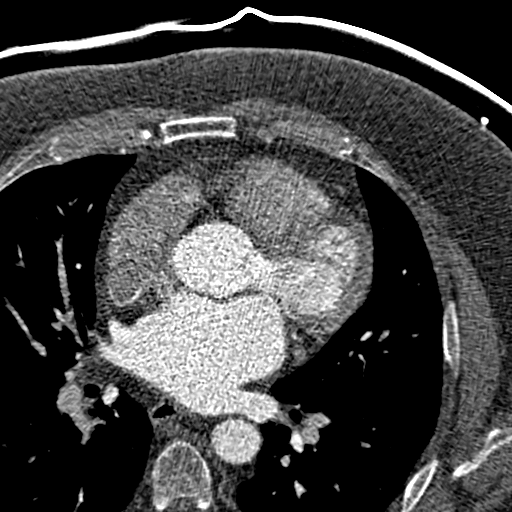
[im 224/318  vessel]
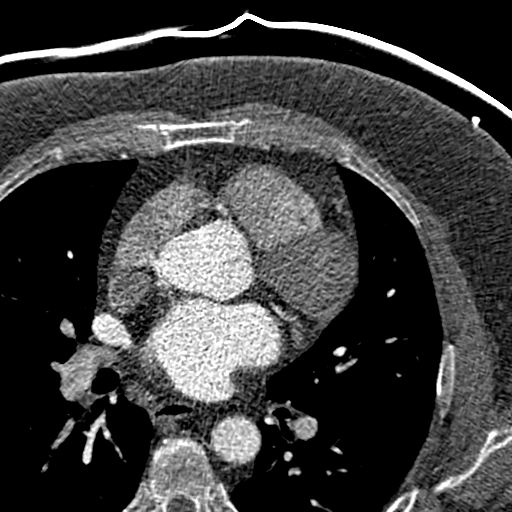
[im 243/318  vessel]
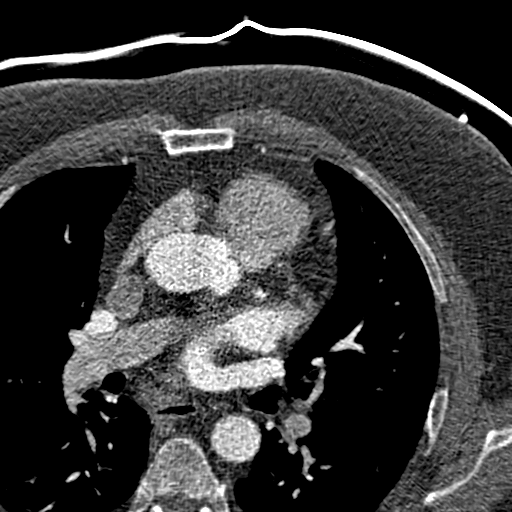
[im 262/318  vessel]
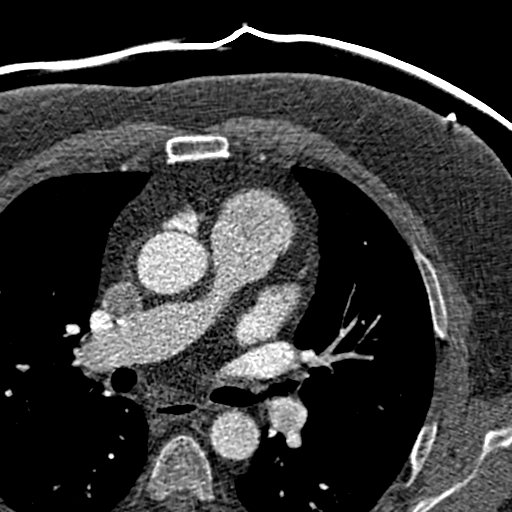
[im 262/318  lung]
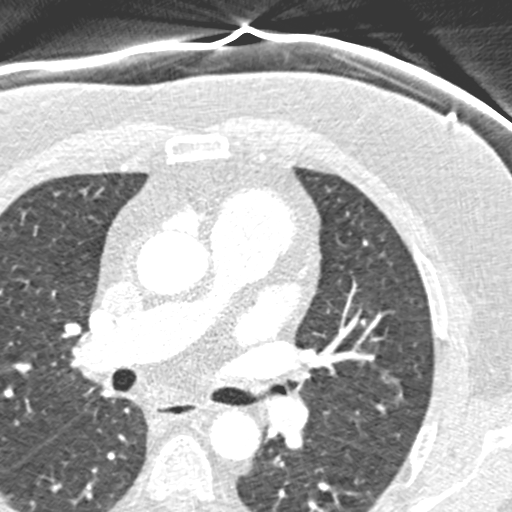
[im 280/318  vessel]
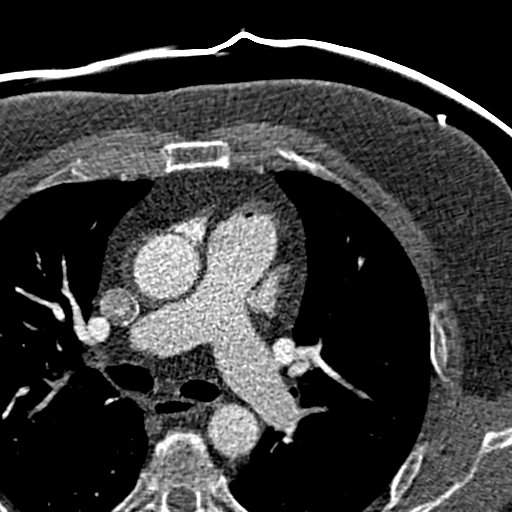
[im 299/318  vessel]
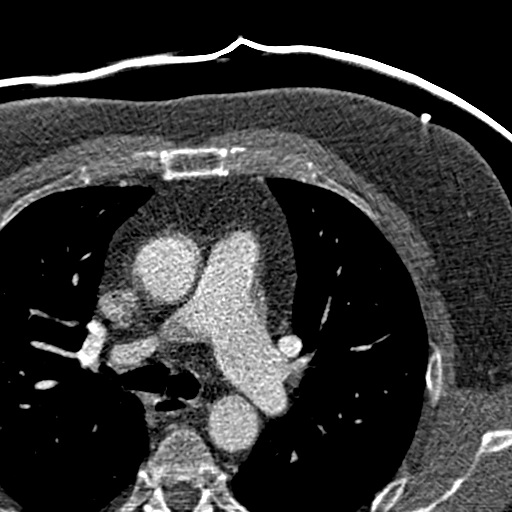

[15 of 20 positions shown; findings below may reference images not displayed]

FINDINGS: There was sever LAE.  Parasternal 3 chamber diameter was
50mm.  There was mild DORADA.  Qualitative LV EF was normal at 60%.
RV was normal.  There was no ASD or VSD.  There was no pericardial
effusion.  Ther coronaries arose normally from the appropriate
cusps.  There was no significant stenosis in the proximal vessels.
The aortic root was normal.

The patient had 5 major PV's including a RMPV:: AP diameters were
as follows:

LUPV: 1.7 cm

LLPV 1.6 cm

RUPV: 1.9 cm

RMPV: 1.3 cm

RLPV: 1.9 cm

There was no evidence of pulmonary vein stenosis and no DEIBYN
thrombus.
IMPRESSION: 1)    Normal PV anatomy including RMPV.  See above for diameters.
      No pulmonary vein stenosis

2)    Severe LAE 3 chamber diameter 50mm
3)    Mild DORADA
4)    Qualitatively normal EF 60%
5)    No DEIBYN thrombus

## 2012-06-27 ENCOUNTER — Other Ambulatory Visit: Payer: Self-pay | Admitting: *Deleted

## 2012-06-27 MED ORDER — LINAGLIPTIN 5 MG PO TABS: ORAL_TABLET | ORAL | Status: DC

## 2012-06-27 NOTE — Telephone Encounter (Signed)
Rx sent 

## 2012-07-16 ENCOUNTER — Telehealth: Payer: Self-pay | Admitting: Internal Medicine

## 2012-07-16 DIAGNOSIS — I4891 Unspecified atrial fibrillation: Secondary | ICD-10-CM

## 2012-07-16 NOTE — Telephone Encounter (Signed)
New Prob     Pt states he is experiencing some rapid heart beat over the last few days and would like to speak to nurse regarding this. Please call.

## 2012-07-16 NOTE — Telephone Encounter (Signed)
Patient is due to see Dr Johney Frame next week and was going to report great news with heart.  However on Fri his heart went out of rhythm and his rates have been running around 115-125.  He feel ok. His medications are unchanged.  I let him know I will discuss with Dr Johney Frame tomorrow and call him back

## 2012-07-17 ENCOUNTER — Other Ambulatory Visit: Payer: Self-pay | Admitting: *Deleted

## 2012-07-17 NOTE — Telephone Encounter (Addendum)
Follow up  Pt states he is not feeling well. He asked if you could give him a call back.   Pt is coming in for an EKG at 10 and labs for a DCCV at 1 on 07/18/12 if still out of rhythm

## 2012-07-18 ENCOUNTER — Other Ambulatory Visit (INDEPENDENT_AMBULATORY_CARE_PROVIDER_SITE_OTHER): Payer: BC Managed Care – PPO

## 2012-07-18 ENCOUNTER — Encounter (HOSPITAL_COMMUNITY): Payer: Self-pay | Admitting: Anesthesiology

## 2012-07-18 ENCOUNTER — Ambulatory Visit (INDEPENDENT_AMBULATORY_CARE_PROVIDER_SITE_OTHER): Payer: BC Managed Care – PPO | Admitting: *Deleted

## 2012-07-18 ENCOUNTER — Ambulatory Visit (HOSPITAL_COMMUNITY)
Admission: RE | Admit: 2012-07-18 | Discharge: 2012-07-18 | Disposition: A | Discharge status: Home / Self Care | Payer: BC Managed Care – PPO | Source: Ambulatory Visit | Attending: Cardiovascular Disease | Admitting: Cardiovascular Disease

## 2012-07-18 ENCOUNTER — Ambulatory Visit (HOSPITAL_COMMUNITY): Payer: BC Managed Care – PPO | Admitting: Anesthesiology

## 2012-07-18 ENCOUNTER — Encounter (HOSPITAL_COMMUNITY)
Admission: RE | Disposition: A | Discharge status: Home / Self Care | Payer: Self-pay | Source: Ambulatory Visit | Attending: Cardiovascular Disease

## 2012-07-18 VITALS — BP 110/84 | HR 108 | Resp 18

## 2012-07-18 DIAGNOSIS — Z87891 Personal history of nicotine dependence: Secondary | ICD-10-CM | POA: Insufficient documentation

## 2012-07-18 DIAGNOSIS — I4891 Unspecified atrial fibrillation: Secondary | ICD-10-CM | POA: Insufficient documentation

## 2012-07-18 DIAGNOSIS — E119 Type 2 diabetes mellitus without complications: Secondary | ICD-10-CM | POA: Insufficient documentation

## 2012-07-18 DIAGNOSIS — I10 Essential (primary) hypertension: Secondary | ICD-10-CM | POA: Insufficient documentation

## 2012-07-18 DIAGNOSIS — E039 Hypothyroidism, unspecified: Secondary | ICD-10-CM | POA: Insufficient documentation

## 2012-07-18 DIAGNOSIS — Z79899 Other long term (current) drug therapy: Secondary | ICD-10-CM | POA: Insufficient documentation

## 2012-07-18 DIAGNOSIS — J309 Allergic rhinitis, unspecified: Secondary | ICD-10-CM | POA: Insufficient documentation

## 2012-07-18 DIAGNOSIS — Z7901 Long term (current) use of anticoagulants: Secondary | ICD-10-CM | POA: Insufficient documentation

## 2012-07-18 DIAGNOSIS — E785 Hyperlipidemia, unspecified: Secondary | ICD-10-CM | POA: Insufficient documentation

## 2012-07-18 HISTORY — PX: CARDIOVERSION: SHX1299

## 2012-07-18 LAB — CBC WITH DIFFERENTIAL/PLATELET
Basophils Absolute: 0.1 10*3/uL (ref 0.0–0.1)
Basophils Relative: 0.7 % (ref 0.0–3.0)
Eosinophils Absolute: 0.4 10*3/uL (ref 0.0–0.7)
Eosinophils Relative: 5.7 % — ABNORMAL HIGH (ref 0.0–5.0)
HCT: 45.9 % (ref 39.0–52.0)
Hemoglobin: 15.6 g/dL (ref 13.0–17.0)
Lymphocytes Relative: 18 % (ref 12.0–46.0)
Lymphs Abs: 1.4 10*3/uL (ref 0.7–4.0)
MCHC: 34 g/dL (ref 30.0–36.0)
MCV: 89 fl (ref 78.0–100.0)
Monocytes Absolute: 0.7 10*3/uL (ref 0.1–1.0)
Monocytes Relative: 9.6 % (ref 3.0–12.0)
Neutro Abs: 5 10*3/uL (ref 1.4–7.7)
Neutrophils Relative %: 66 % (ref 43.0–77.0)
Platelets: 234 10*3/uL (ref 150.0–400.0)
RBC: 5.16 Mil/uL (ref 4.22–5.81)
RDW: 14.2 % (ref 11.5–14.6)
WBC: 7.6 10*3/uL (ref 4.5–10.5)

## 2012-07-18 LAB — BASIC METABOLIC PANEL
BUN: 22 mg/dL (ref 6–23)
CO2: 27 mEq/L (ref 19–32)
Calcium: 9.7 mg/dL (ref 8.4–10.5)
Chloride: 103 mEq/L (ref 96–112)
Creatinine, Ser: 0.9 mg/dL (ref 0.4–1.5)
GFR: 85.41 mL/min (ref >60.00)
Glucose, Bld: 145 mg/dL — ABNORMAL HIGH (ref 70–99)
Potassium: 4.1 mEq/L (ref 3.5–5.1)
Sodium: 139 mEq/L (ref 135–145)

## 2012-07-18 LAB — MAGNESIUM: Magnesium: 1.7 mg/dL (ref 1.5–2.5)

## 2012-07-18 SURGERY — CARDIOVERSION
Anesthesia: Monitor Anesthesia Care | Wound class: Clean

## 2012-07-18 MED ORDER — PROPOFOL 10 MG/ML IV BOLUS
INTRAVENOUS | Status: DC | PRN
  Administered 2012-07-18: 70 mg via INTRAVENOUS

## 2012-07-18 MED ORDER — SODIUM CHLORIDE 0.9 % IV SOLN
INTRAVENOUS | Status: DC
  Administered 2012-07-18: 500 mL via INTRAVENOUS

## 2012-07-18 MED ORDER — SODIUM CHLORIDE 0.9 % IV SOLN
INTRAVENOUS | Status: DC | PRN
  Administered 2012-07-18: 13:00:00 via INTRAVENOUS

## 2012-07-18 MED ORDER — LIDOCAINE HCL (CARDIAC) 20 MG/ML IV SOLN
INTRAVENOUS | Status: DC | PRN
  Administered 2012-07-18: 50 mg via INTRAVENOUS

## 2012-07-18 NOTE — CV Procedure (Signed)
    Cardioversion Note  [name] [id number] 1945-02-06  Procedure: DC Cardioversion Indications: atrial fibrillation  Procedure Details Consent: Obtained Time Out: Verified patient identification, verified procedure, site/side was marked, verified correct patient position, special equipment/implants available, Radiology Safety Procedures followed,  medications/allergies/relevent history reviewed, required imaging and test results available.  Performed  The patient has been on adequate anticoagulation.  The patient received IV Diprovan 70  for sedation.  Synchronous cardioversion was performed at 120 joules.  The cardioversion was successful.     Complications: No apparent complications Patient did tolerate procedure well.   Vesta Mixer, Montez Hageman., MD, [facility] 07/18/2012, 1:21 PM

## 2012-07-18 NOTE — Preoperative (Signed)
Beta Blockers   Reason not to administer Beta Blockers:Not Applicable 

## 2012-07-18 NOTE — Progress Notes (Signed)
Patient here for precardioversion EKG. EKG reviewed by Dr. Johney Frame which showed atypical atrial flutter. Patient will arrive at short stay at 12 noon for a 1pm cardioversion at [facility] with Dr.Nahser. Lab work completed and will be sent STAT for results.

## 2012-07-18 NOTE — Anesthesia Postprocedure Evaluation (Signed)
  Anesthesia Post-op Note  Patient: [name]  Procedure(s) Performed: Procedure(s): CARDIOVERSION (N/A)  Patient Location: Endoscopy Unit  Anesthesia Type:General  Level of Consciousness: awake  Airway and Oxygen Therapy: Patient Spontanous Breathing  Post-op Pain: none  Post-op Assessment: Post-op Vital signs reviewed, Patient's Cardiovascular Status Stable, Respiratory Function Stable, Patent Airway, No signs of Nausea or vomiting and Pain level controlled  Post-op Vital Signs: Reviewed and stable  Complications: No apparent anesthesia complications

## 2012-07-18 NOTE — Transfer of Care (Signed)
Immediate Anesthesia Transfer of Care Note  Patient: [name]  Procedure(s) Performed: Procedure(s): CARDIOVERSION (N/A)  Patient Location: Endoscopy Unit  Anesthesia Type:General  Level of Consciousness: awake  Airway & Oxygen Therapy: Patient Spontanous Breathing  Post-op Assessment: Report given to PACU RN, Post -op Vital signs reviewed and stable and Patient moving all extremities  Post vital signs: Reviewed and stable  Complications: No apparent anesthesia complications

## 2012-07-18 NOTE — Interval H&P Note (Signed)
History and Physical Interval Note:  07/18/2012 1:19 PM  [name]  has presented today for surgery, with the diagnosis of AFIB  The various methods of treatment have been discussed with the patient and family. After consideration of risks, benefits and other options for treatment, the patient has consented to  Procedure(s): CARDIOVERSION (N/A) as a surgical intervention .  The patient's history has been reviewed, patient examined, no change in status, stable for surgery.  I have reviewed the patient's chart and labs.  Questions were answered to the patient's satisfaction.     [name].

## 2012-07-18 NOTE — Anesthesia Preprocedure Evaluation (Addendum)
Anesthesia Evaluation  Patient identified by MRN, date of birth, ID band Patient awake    Reviewed: Allergy & Precautions, H&P , NPO status , Patient's Chart, lab work & pertinent test results, reviewed documented beta blocker date and time   Airway       Dental   Pulmonary sleep apnea , former smoker (quit 1976),          Cardiovascular hypertension, Pt. on medications and Pt. on home beta blockers + dysrhythmias Atrial Fibrillation  '12 ECHO: EF 60-65% Cath '05: no sig ASCADz   Neuro/Psych    GI/Hepatic   Endo/Other  diabetes (glu 145), Well Controlled, Type 2, Oral Hypoglycemic AgentsHypothyroidism Morbid obesity  Renal/GU Renal diseasenegative Renal ROS     Musculoskeletal   Abdominal   Peds  Hematology   Anesthesia Other Findings   Reproductive/Obstetrics                       Anesthesia Physical Anesthesia Plan  ASA: II  Anesthesia Plan: General   Post-op Pain Management:    Induction: Intravenous  Airway Management Planned: Natural Airway and Mask  Additional Equipment:   Intra-op Plan:   Post-operative Plan:   Informed Consent: I have reviewed the patients History and Physical, chart, labs and discussed the procedure including the risks, benefits and alternatives for the proposed anesthesia with the patient or authorized representative who has indicated his/her understanding and acceptance.   Dental advisory given  Plan Discussed with: CRNA, Anesthesiologist and Surgeon  Anesthesia Plan Comments:         Anesthesia Quick Evaluation

## 2012-07-18 NOTE — H&P (Signed)
PCP:  [name], MD   The patient presents today for electrophysiology followup.  He has not been seen by me in some time. Since last being seen in our clinic, the patient was referred and underwent Convergent procedure at [facility].  He reports slow recovery from the procedure.  He recently went to [facility] for follow-up and was found to have atypical atrial flutter.  He was instructed to follow-up with me to arrange cardioversion.  He is adequately anticoagulation.  He presently reports fatigue and rare palpitations with his atrial flutter.  Today, he denies symptoms of pchest pain, shortness of breath, orthopnea, PND, lower extremity edema, dizziness, presyncope, syncope, or neurologic sequela.  The patient feels that he is tolerating medications without difficulties and is otherwise without complaint today.     Past Medical History   Diagnosis  Date   .  Renal calculus  2005   .  Diabetes mellitus         type 2   .  Hyperlipidemia     .  Hypertension     .  AF (atrial fibrillation)         persistent afib, s/p afib ablation x 2 with subsequent convergent procedure at [facility]   .  Allergic rhinitis     .  Hypothyroidism         Past Surgical History   Procedure  Date   .  Cardioversion     .  Cardiac catheterization  2005       non obstructive CAD   .  Afib 09/22/2008--11/03/2009         afib ablation x 2 by Dr Johney Frame with subsequent convergent procedure at [facility]   .  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  Cardioversion  12/09/2010       Procedure: CARDIOVERSION;  Surgeon: Marca Ancona, MD;  Location: [facility] OR;  Service: Cardiovascular;  Laterality: N/A;   .  Joint replacement     .  Tonsillectomy           Current Outpatient Prescriptions   Medication  Sig  Dispense  Refill   .  AMBULATORY NON FORMULARY MEDICATION  OCUVITE, 1 by mouth daily         .  dofetilide (TIKOSYN) 250 MCG capsule  Take 250 mcg by mouth 2 (two) times daily.          Marland Kitchen  glimepiride (AMARYL) 2 MG tablet  Take 2 mg by mouth. 1/2 by mouth daily         .  glucose blood (ONE TOUCH ULTRA TEST) test strip  Check blood sugar once daily DX: 250.00   100 each   12   .  levothyroxine (SYNTHROID, LEVOTHROID) 25 MCG tablet  1 pill Monday, Wednesday, Friday, and Sunday. One & one  half pills on Tuesday, Thursday, and Saturday.   90 tablet   1   .  linagliptin (TRADJENTA) 5 MG TABS tablet  TAKE 1 TABLET BY MOUTH EVERY DAY   90 tablet   1   .  metFORMIN (GLUCOPHAGE XR) 500 MG 24 hr tablet  1 twice a day with breakfast and evening meal   60 tablet   2   .  metoprolol (LOPRESSOR) 100 MG tablet  Take 100 mg by mouth 2 (two) times daily. Take 200 mg at breakfast & 100 mg in evening.         Marland Kitchen  Multiple Vitamin (MULTIVITAMIN) capsule  Take 1 capsule by mouth daily.           .  NON FORMULARY  Inject 1 vial as directed every 30 (thirty) days. Allergy injections taken twice a month         .  quinapril (ACCUPRIL) 40 MG tablet  TAKE 1 TABLET DAILY   90 tablet   1   .  Rivaroxaban (XARELTO) 20 MG TABS  Take 20 mg by mouth daily.   30 tablet   11     No Known Allergies    History      Social History   .  Marital Status:  Married       Spouse Name:  [name]       Number of Children:  Y   .  Years of Education:  N/A       Occupational History   .  BUYER      Social History Main Topics   .  Smoking status:  Former Smoker -- 0.9 packs/day for 8 years       Types:  Cigarettes       Quit date:  01/16/1974   .  Smokeless tobacco:  Not on file   .  Alcohol Use:  Yes         Social   .  Drug Use:  No   .  Sexually Active:  Not on file    Social History Narrative   .  No narrative on file    Family History   Problem  Relation  Age of Onset   .  Lung cancer  Mother     .  Diabetes  Father     .  Testicular cancer  Brother     .  Allergies  Maternal Grandmother     .  Allergies  Brother     .  Cancer  Maternal Grandmother         unsure     ROS-  All systems are  reviewed and are negative except as outlined in the HPI above   BP 133/79  Temp(Src) 97.9 F (36.6 C) (Oral)  Resp 20  Ht 5\' 8"  (1.727 m)  Wt 198 lb (89.812 kg)  BMI 30.11 kg/m2  SpO2 96%   GEN- The patient is well appearing, alert and oriented x 3 today.    Head- normocephalic, atraumatic Eyes-  Sclera clear, conjunctiva pink Ears- hearing intact Oropharynx- clear Neck- supple, no JVP Lymph- no cervical lymphadenopathy Lungs- Clear to ausculation bilaterally, normal work of breathing Heart- irregular rate and rhythm, no murmurs, rubs or gallops, PMI not laterally displaced GI- soft, NT, ND, + BS Extremities- no clubbing, cyanosis, or edema MS- no significant deformity or atrophy Skin- no rash or lesion Psych- euthymic mood, full affect Neuro- strength and sensation are intact   ekg today reveals atypical atrial flutter V rate 123 bpm, incomplete RBBB  Assessment and Plan:             Mr Fonder's afib has been quite difficult to control and has been refractory to multiple procedures including PVI x 2 by me.  He is recently s/p convergent procedure 6/13.  Today, he presents with atypical atrial flutter.  As he remains within the early healing phases of his procedure, I have encouraged the patient to remain hopeful.  We will proceed with cardioversion.  He is appropriately anticoagulated at this time with xarelto.  Risks, benefits, and alternatives  to cardioversion were discussed at length with the patient today including reaction to anesthesia and cardioemboli.  The patient accepts these risks and wishes to proceed.  We will therefore arrange cardioversion at the next available time.    July 18, 2012:   Pt called the office earlier this week complaining of atrial fib.  ECG confirmed that he was still in AF.   He was scheduled for cardioversion today.  He has been taking his Xarelto.Vesta Mixer, Montez Hageman., MD, [facility] 07/18/2012, 1:27 PM Office - [phone] Pager  [phone]

## 2012-07-18 NOTE — H&P (Signed)
See notes from the office.  He has hx of atrial fib.  He called complaining of palpitations.  ECG revealed  A-Fib  He was scheduled for cardioversion.  Risks, benefits, options discussed.

## 2012-07-19 ENCOUNTER — Encounter (HOSPITAL_COMMUNITY): Payer: Self-pay | Admitting: Cardiovascular Disease

## 2012-07-22 ENCOUNTER — Ambulatory Visit: Payer: BC Managed Care – PPO | Admitting: Internal Medicine

## 2012-07-29 ENCOUNTER — Telehealth: Payer: Self-pay | Admitting: Internal Medicine

## 2012-07-29 NOTE — Telephone Encounter (Signed)
New Prob     Requested most recent EKG. Please fax [phone] attn: Cala Bradford.

## 2012-07-29 NOTE — Telephone Encounter (Signed)
faxed

## 2012-07-30 ENCOUNTER — Encounter: Payer: Self-pay | Admitting: Internal Medicine

## 2012-08-19 ENCOUNTER — Ambulatory Visit (INDEPENDENT_AMBULATORY_CARE_PROVIDER_SITE_OTHER): Payer: Managed Care, Other (non HMO) | Admitting: Ophthalmology

## 2012-08-21 ENCOUNTER — Other Ambulatory Visit: Payer: Self-pay

## 2012-08-21 ENCOUNTER — Ambulatory Visit (INDEPENDENT_AMBULATORY_CARE_PROVIDER_SITE_OTHER): Payer: BC Managed Care – PPO | Admitting: Ophthalmology

## 2012-08-21 DIAGNOSIS — I10 Essential (primary) hypertension: Secondary | ICD-10-CM

## 2012-08-21 DIAGNOSIS — E11319 Type 2 diabetes mellitus with unspecified diabetic retinopathy without macular edema: Secondary | ICD-10-CM

## 2012-08-21 DIAGNOSIS — H35039 Hypertensive retinopathy, unspecified eye: Secondary | ICD-10-CM

## 2012-08-21 DIAGNOSIS — H2510 Age-related nuclear cataract, unspecified eye: Secondary | ICD-10-CM

## 2012-08-21 DIAGNOSIS — E1139 Type 2 diabetes mellitus with other diabetic ophthalmic complication: Secondary | ICD-10-CM

## 2012-08-21 DIAGNOSIS — H43819 Vitreous degeneration, unspecified eye: Secondary | ICD-10-CM

## 2012-09-02 ENCOUNTER — Telehealth: Payer: Self-pay | Admitting: Internal Medicine

## 2012-09-02 DIAGNOSIS — E119 Type 2 diabetes mellitus without complications: Secondary | ICD-10-CM

## 2012-09-02 NOTE — Telephone Encounter (Signed)
Patient is scheduled to come in for his A1c on 09/04/12. Needs orders.

## 2012-09-03 NOTE — Telephone Encounter (Signed)
Future order entered and sent.//AB/CMA

## 2012-09-04 ENCOUNTER — Other Ambulatory Visit (INDEPENDENT_AMBULATORY_CARE_PROVIDER_SITE_OTHER): Payer: BC Managed Care – PPO

## 2012-09-04 DIAGNOSIS — E119 Type 2 diabetes mellitus without complications: Secondary | ICD-10-CM

## 2012-09-04 LAB — HEMOGLOBIN A1C: Hgb A1c MFr Bld: 6.9 % — ABNORMAL HIGH (ref 4.6–6.5)

## 2012-09-14 ENCOUNTER — Other Ambulatory Visit: Payer: Self-pay | Admitting: Internal Medicine

## 2012-09-17 NOTE — Telephone Encounter (Signed)
Refill done.  

## 2012-10-16 ENCOUNTER — Ambulatory Visit (INDEPENDENT_AMBULATORY_CARE_PROVIDER_SITE_OTHER): Payer: BC Managed Care – PPO | Admitting: Internal Medicine

## 2012-10-16 ENCOUNTER — Encounter: Payer: Self-pay | Admitting: Internal Medicine

## 2012-10-16 ENCOUNTER — Ambulatory Visit (INDEPENDENT_AMBULATORY_CARE_PROVIDER_SITE_OTHER): Payer: BC Managed Care – PPO | Admitting: *Deleted

## 2012-10-16 VITALS — BP 148/87 | HR 80 | Ht 69.0 in | Wt 205.0 lb

## 2012-10-16 DIAGNOSIS — I4891 Unspecified atrial fibrillation: Secondary | ICD-10-CM

## 2012-10-16 DIAGNOSIS — I10 Essential (primary) hypertension: Secondary | ICD-10-CM

## 2012-10-16 LAB — BASIC METABOLIC PANEL
BUN: 20 mg/dL (ref 6–23)
CO2: 28 mEq/L (ref 19–32)
Calcium: 9.5 mg/dL (ref 8.4–10.5)
Chloride: 106 mEq/L (ref 96–112)
Creatinine, Ser: 0.9 mg/dL (ref 0.4–1.5)
GFR: 89.73 mL/min (ref >60.00)
Glucose, Bld: 111 mg/dL — ABNORMAL HIGH (ref 70–99)
Potassium: 4.3 mEq/L (ref 3.5–5.1)
Sodium: 138 mEq/L (ref 135–145)

## 2012-10-16 LAB — MAGNESIUM: Magnesium: 1.6 mg/dL (ref 1.5–2.5)

## 2012-10-16 LAB — PACEMAKER DEVICE OBSERVATION

## 2012-10-16 NOTE — Progress Notes (Signed)
ILR check in clinic. 46 AF episodes--CV 07-18-12. No episodes since then. Battery status good. Carelink 01-20-13 and ROV in 6 mths w/JA.

## 2012-10-16 NOTE — Patient Instructions (Addendum)
Your physician wants you to follow-up in: 6 months with Dr Jacquiline Doe will receive a reminder letter in the mail two months in advance. If you don't receive a letter, please call our office to schedule the follow-up appointment.  Your physician recommends that you return for lab work today

## 2012-10-16 NOTE — Progress Notes (Signed)
PCP:  [name], MD  The patient presents today for electrophysiology followup.   He recently developed recurrent atrial arrhythmias 6/14 and underwent cardioversion 07/18/12.  He has done well without any further arrhythmias since that time. Today, he denies symptoms of pchest pain, shortness of breath, orthopnea, PND, lower extremity edema, dizziness, presyncope, syncope, or neurologic sequela.  The patient feels that he is tolerating medications without difficulties and is otherwise without complaint today.   Past Medical History  Diagnosis Date  . Diabetes mellitus     type 2  . Hyperlipidemia   . Hypertension   . Allergic rhinitis     seasonal  . Hypothyroidism   . AF (atrial fibrillation)     persistent afib, s/p afib ablation x 2 with subsequent convergent procedure at [facility]  . CTS (carpal tunnel syndrome)     PMH of  . Renal calculus 2005    [facility]   Past Surgical History  Procedure Laterality Date  . Cardioversion    . Afib 09/22/2008--11/03/2009      afib ablation x 2 by Dr Johney Frame with subsequent convergent procedure at [facility]  . Total hip arthroplasty  06/2010    Depuy prosthesis replacement, Dr Charlann Boxer  . Cardioversion  12/09/2010    Procedure: CARDIOVERSION;  Surgeon: Marca Ancona, MD;  Location: [facility] OR;  Service: Cardiovascular;  Laterality: N/A;  . Total hip arthroplasty Right 2008     Depuy;Dr Charlann Boxer  . Tonsillectomy    . Cardioversion  09/15/2011    Procedure: CARDIOVERSION;  Surgeon: Wendall Stade, MD;  Location: [facility] ENDOSCOPY;  Service: Cardiovascular;  Laterality: N/A;  office drawing labs wed 8/28  . Loop recorder implant Left 12-2010  . Cardiac catheterization  2005    non obstructive CAD  . Convergent procedure  2013    at [facility] for a fib  . Cardioversion N/A 07/18/2012    Procedure: CARDIOVERSION;  Surgeon: Vesta Mixer, MD;  Location: [facility] ENDOSCOPY;  Service: Cardiovascular;  Laterality: N/A;    Current Outpatient Prescriptions  Medication Sig  Dispense Refill  . AMBULATORY NON FORMULARY MEDICATION OCUVITE, 1 by mouth daily      . amLODipine (NORVASC) 10 MG tablet Take 1 tablet (10 mg total) by mouth daily.  90 tablet  3  . dofetilide (TIKOSYN) 250 MCG capsule Take 250 mcg by mouth 2 (two) times daily.      Marland Kitchen levothyroxine (SYNTHROID, LEVOTHROID) 25 MCG tablet 1 pill Monday, Wednesday, Friday, and Sunday. One & one  half pills on Tuesday, Thursday, and Saturday.  96 tablet  3  . linagliptin (TRADJENTA) 5 MG TABS tablet Take 5 mg by mouth daily.      . metFORMIN (GLUCOPHAGE-XR) 500 MG 24 hr tablet Take 2 tabs (1000mg ) twice daily with meals  360 tablet  3  . Multiple Vitamin (MULTIVITAMIN) capsule Take 1 capsule by mouth daily.        . NON FORMULARY Inject 1 vial as directed every 30 (thirty) days. Allergy injections taken twice a month      . ONE TOUCH ULTRA TEST test strip CHECK BLOOD SUGAR ONCE DAILY  100 each  4  . quinapril (ACCUPRIL) 40 MG tablet TAKE 1 TABLET DAILY  90 tablet  3  . Rivaroxaban (XARELTO) 20 MG TABS Take 20 mg by mouth daily.  30 tablet  11  . TRADJENTA 5 MG TABS tablet TAKE 1 TABLET BY MOUTH EVERY DAY  90 tablet  0  . metoprolol (  LOPRESSOR) 100 MG tablet Take 100 mg by mouth 2 (two) times daily. Take 200 mg at breakfast & 150 mg in evening.       No current facility-administered medications for this visit.    No Known Allergies  History   Social History  . Marital Status: Married    Spouse Name: [name]    Number of Children: Y  . Years of Education: N/A   Occupational History  . BUYER    Social History Main Topics  . Smoking status: Former Smoker -- 0.90 packs/day for 8 years    Types: Cigarettes    Quit date: 01/16/1974  . Smokeless tobacco: Not on file  . Alcohol Use: Yes     Comment: Socially; 2 glasses of wine / month  . Drug Use: No  . Sexual Activity: Not on file   Other Topics Concern  . Not on file   Social History Narrative  . No narrative on file    Family History  Problem  Relation Age of Onset  . Lung cancer Mother     non smoker  . Stroke Mother 77  . Diabetes Father   . Stroke Father     mid 31s  . Testicular cancer Brother   . Allergies Maternal Grandmother   . Cancer Maternal Grandmother     unsure  . Allergies Brother   . Heart disease Neg Hx     ROS-  All systems are reviewed and are negative except as outlined in the HPI above   Physical Exam: Filed Vitals:   10/16/12 1142  BP: 148/87  Pulse: 80  Height: 5\' 9"  (1.753 m)  Weight: 205 lb (92.987 kg)    GEN- The patient is well appearing, alert and oriented x 3 today.   Head- normocephalic, atraumatic Eyes-  Sclera clear, conjunctiva pink Ears- hearing intact Oropharynx- clear Neck- supple, no JVP Lymph- no cervical lymphadenopathy Lungs- Clear to ausculation bilaterally, normal work of breathing Heart- irregular rate and rhythm, no murmurs, rubs or gallops, PMI not laterally displaced GI- soft, NT, ND, + BS Extremities- no clubbing, cyanosis, or edema MS- no significant deformity or atrophy Skin- no rash or lesion Psych- euthymic mood, full affect Neuro- strength and sensation are intact  ekg today reveals sinus rhyhtm 75 bpm, incomplete RBBB  Assessment and Plan:  1. afib ILR interrogation today is reviewed and reveals no arrhythmias since cardioversion 07/18/12 Continue current medicines He would like to consider stopping tikosyn if no further arrhythmias upon return Continue anticoagulation  2. HTN Stable No change required today  ILR carelink every 3 months I will see in 6 months

## 2012-10-20 ENCOUNTER — Encounter: Payer: Self-pay | Admitting: Internal Medicine

## 2012-10-22 ENCOUNTER — Encounter: Payer: Self-pay | Admitting: Internal Medicine

## 2012-10-22 ENCOUNTER — Other Ambulatory Visit: Payer: Self-pay

## 2012-10-22 ENCOUNTER — Telehealth: Payer: Self-pay | Admitting: *Deleted

## 2012-10-22 MED ORDER — METOPROLOL TARTRATE 100 MG PO TABS: ORAL_TABLET | ORAL | Status: DC

## 2012-10-22 MED ORDER — MAGNESIUM OXIDE 400 MG PO CAPS: 400.0000 mg | ORAL_CAPSULE | Freq: Two times a day (BID) | ORAL | Status: DC

## 2012-10-22 NOTE — Telephone Encounter (Signed)
Message copied by Deliah Boston on Tue Oct 22, 2012  1:20 PM ------      Message from: Hillis Range      Created: Wed Oct 16, 2012  9:58 PM       Results reviewed.  Please inform pt of result.      Please supplement magnesium ------

## 2012-10-22 NOTE — Telephone Encounter (Signed)
To start mag 400mg  bid

## 2012-10-29 ENCOUNTER — Other Ambulatory Visit: Payer: Self-pay

## 2012-10-29 ENCOUNTER — Encounter: Payer: Self-pay | Admitting: Internal Medicine

## 2012-10-29 MED ORDER — MAGNESIUM OXIDE 400 MG PO CAPS: 400.0000 mg | ORAL_CAPSULE | Freq: Two times a day (BID) | ORAL | Status: DC

## 2012-10-30 ENCOUNTER — Encounter: Payer: Self-pay | Admitting: Podiatry

## 2012-10-30 ENCOUNTER — Ambulatory Visit (INDEPENDENT_AMBULATORY_CARE_PROVIDER_SITE_OTHER): Payer: BC Managed Care – PPO | Admitting: Podiatry

## 2012-10-30 VITALS — BP 160/81 | HR 77 | Resp 17 | Wt 198.0 lb

## 2012-10-30 DIAGNOSIS — M7750 Other enthesopathy of unspecified foot: Secondary | ICD-10-CM

## 2012-10-30 DIAGNOSIS — G589 Mononeuropathy, unspecified: Secondary | ICD-10-CM

## 2012-10-30 NOTE — Progress Notes (Signed)
  Subjective:    Patient ID: [name], male    DOB: December 19, 1945, 67 y.o.   MRN: [id number]  HPIN NUMBNESS, SORENESS TO TOE       L L MEDIAL HALLUX       D 6 MONTHS       O GRADUAL       C TENDERNESS, NUMBNESS       A ENCLOSED SHOES, OR RUBBING SHOES       T NO TREATMENT ATTEMPTED    Review of Systems  Constitutional: Negative.   HENT: Negative.   Eyes: Negative.   Respiratory: Negative.   Cardiovascular: Negative.   Gastrointestinal: Negative.   Endocrine: Negative.   Genitourinary: Negative.   Musculoskeletal: Negative.   Skin: Negative.   Allergic/Immunologic: Negative.   Neurological: Negative.   Hematological: Negative.   Psychiatric/Behavioral: Negative.        Objective:   Physical Exam        Assessment & Plan:

## 2012-10-31 ENCOUNTER — Ambulatory Visit: Payer: BC Managed Care – PPO | Admitting: Podiatry

## 2012-10-31 NOTE — Progress Notes (Signed)
Subjective:     Patient ID: [name], male   DOB: 1945-08-30, 67 y.o.   MRN: [id number]  Toe Pain    patient presents stating I've had numbness in my big toe and I just wanted to make sure it was okay   Review of Systems  All other systems reviewed and are negative.       Objective:   Physical Exam  Nursing note and vitals reviewed. Constitutional: He appears well-developed and well-nourished.  Cardiovascular: Intact distal pulses.   Neurological: He is alert.  Skin: Skin is warm.   I did full distal neurological testing and found DTR reflexes and sharp dull vibratory were intact. Range of motion of the first MPJ is intact with normal muscle strength noted    Assessment:     Possible neuropraxia versus possible for back compression or other unknown neurological condition    Plan:     Educated patient on this as long as his symptoms are low grade we will just watch it. If he should develop muscle weakness or progression of nerve issues he is to contact me

## 2012-11-21 ENCOUNTER — Other Ambulatory Visit: Payer: Self-pay

## 2012-11-26 ENCOUNTER — Other Ambulatory Visit: Payer: Self-pay | Admitting: Internal Medicine

## 2012-11-26 NOTE — Telephone Encounter (Signed)
Levothyroxine refilled for one month. OV due 

## 2012-12-06 ENCOUNTER — Encounter: Payer: Self-pay | Admitting: Internal Medicine

## 2012-12-17 ENCOUNTER — Encounter: Payer: Self-pay | Admitting: Internal Medicine

## 2012-12-23 ENCOUNTER — Encounter: Payer: Self-pay | Admitting: Internal Medicine

## 2012-12-24 ENCOUNTER — Other Ambulatory Visit: Payer: Self-pay | Admitting: *Deleted

## 2012-12-28 ENCOUNTER — Other Ambulatory Visit: Payer: Self-pay | Admitting: Internal Medicine

## 2012-12-31 NOTE — Telephone Encounter (Signed)
Med filled, pt due for a CPE.

## 2013-01-16 ENCOUNTER — Telehealth: Payer: Self-pay | Admitting: Nurse Practitioner

## 2013-01-16 ENCOUNTER — Other Ambulatory Visit: Payer: Self-pay | Admitting: Nurse Practitioner

## 2013-01-16 MED ORDER — RIVAROXABAN 20 MG PO TABS: 20.0000 mg | ORAL_TABLET | Freq: Every day | ORAL | Status: DC

## 2013-01-16 NOTE — Telephone Encounter (Signed)
Pts wife called stating that Pt is now out of xarelto.  This was previously rx by MD @ [facility] but his care has been turned back over to Dr. Rayann Heman.  I reviewed his chart to confirm.  I have sent in a Rx for xarelto 20mg  po daily, #30, 11 refills to the [facility] in [place].

## 2013-01-19 ENCOUNTER — Encounter: Payer: Self-pay | Admitting: Internal Medicine

## 2013-01-21 ENCOUNTER — Encounter: Payer: Self-pay | Admitting: Internal Medicine

## 2013-01-22 ENCOUNTER — Other Ambulatory Visit: Payer: Self-pay

## 2013-01-22 ENCOUNTER — Other Ambulatory Visit: Payer: Self-pay | Admitting: Internal Medicine

## 2013-01-22 MED ORDER — METOPROLOL SUCCINATE ER 100 MG PO TB24: ORAL_TABLET | ORAL | Status: DC

## 2013-01-22 NOTE — Telephone Encounter (Signed)
Levothyroxine refilled. Pt needs OV. JG//CMA

## 2013-01-23 ENCOUNTER — Encounter: Payer: Self-pay | Admitting: Internal Medicine

## 2013-01-24 ENCOUNTER — Other Ambulatory Visit: Payer: Self-pay

## 2013-01-24 MED ORDER — DOFETILIDE 250 MCG PO CAPS: 250.0000 ug | ORAL_CAPSULE | Freq: Two times a day (BID) | ORAL | Status: DC

## 2013-01-25 ENCOUNTER — Encounter: Payer: Self-pay | Admitting: Internal Medicine

## 2013-01-27 ENCOUNTER — Other Ambulatory Visit: Payer: Self-pay | Admitting: *Deleted

## 2013-01-27 ENCOUNTER — Encounter: Payer: Self-pay | Admitting: Internal Medicine

## 2013-01-27 ENCOUNTER — Telehealth: Payer: Self-pay | Admitting: *Deleted

## 2013-01-27 MED ORDER — LINAGLIPTIN 5 MG PO TABS: ORAL_TABLET | ORAL | Status: DC

## 2013-01-27 NOTE — Telephone Encounter (Signed)
Message copied by Harl Bowie on Mon Jan 27, 2013 10:50 AM ------      Message from: Hendricks Limes      Created: Fri Jan 24, 2013 11:16 PM       Change Tradjenta to generic Prandin 2 mg  1/2 bid with 2 largest meals      #90 ,R X1        Check   A1c after 3 mos of med change ------

## 2013-01-27 NOTE — Telephone Encounter (Signed)
Spoke with the pt and informed him of Dr. Clayborn Heron recommendation below.  Also informed him that the Tradjenta was refilled today with 3 mos supply to [facility].  Pt stated that some things took place this weekend, so he will go ahead and take the 3 mos supply of Tradjenta.  When he finish the 3 mos of Tradjenta he will change to the Prandin 2mg .  Dr. Linna Darner aware.//AB/CMA

## 2013-01-28 ENCOUNTER — Other Ambulatory Visit: Payer: Self-pay | Admitting: *Deleted

## 2013-01-28 MED ORDER — RIVAROXABAN 20 MG PO TABS: 20.0000 mg | ORAL_TABLET | Freq: Every day | ORAL | Status: DC

## 2013-01-28 NOTE — Telephone Encounter (Signed)
Looks like this was filled by Ignacia Bayley on 01/16/13

## 2013-02-10 ENCOUNTER — Other Ambulatory Visit: Payer: Self-pay | Admitting: Internal Medicine

## 2013-02-10 NOTE — Telephone Encounter (Signed)
Metformin refilled per protocol. JG//CMA

## 2013-03-13 DIAGNOSIS — R9431 Abnormal electrocardiogram [ECG] [EKG]: Secondary | ICD-10-CM | POA: Diagnosis not present

## 2013-03-13 DIAGNOSIS — E785 Hyperlipidemia, unspecified: Secondary | ICD-10-CM | POA: Diagnosis not present

## 2013-03-13 DIAGNOSIS — I498 Other specified cardiac arrhythmias: Secondary | ICD-10-CM | POA: Diagnosis not present

## 2013-03-13 DIAGNOSIS — Z7901 Long term (current) use of anticoagulants: Secondary | ICD-10-CM | POA: Diagnosis not present

## 2013-03-13 DIAGNOSIS — R000 Tachycardia, unspecified: Secondary | ICD-10-CM | POA: Diagnosis not present

## 2013-03-13 DIAGNOSIS — E039 Hypothyroidism, unspecified: Secondary | ICD-10-CM | POA: Diagnosis not present

## 2013-03-13 DIAGNOSIS — I059 Rheumatic mitral valve disease, unspecified: Secondary | ICD-10-CM | POA: Diagnosis not present

## 2013-03-13 DIAGNOSIS — I10 Essential (primary) hypertension: Secondary | ICD-10-CM | POA: Diagnosis not present

## 2013-03-13 DIAGNOSIS — E119 Type 2 diabetes mellitus without complications: Secondary | ICD-10-CM | POA: Diagnosis not present

## 2013-03-13 DIAGNOSIS — R071 Chest pain on breathing: Secondary | ICD-10-CM | POA: Diagnosis not present

## 2013-03-13 DIAGNOSIS — R002 Palpitations: Secondary | ICD-10-CM | POA: Diagnosis not present

## 2013-03-13 DIAGNOSIS — I4891 Unspecified atrial fibrillation: Secondary | ICD-10-CM | POA: Diagnosis not present

## 2013-03-14 DIAGNOSIS — R000 Tachycardia, unspecified: Secondary | ICD-10-CM | POA: Diagnosis not present

## 2013-03-14 DIAGNOSIS — I4891 Unspecified atrial fibrillation: Secondary | ICD-10-CM | POA: Diagnosis not present

## 2013-03-14 DIAGNOSIS — I10 Essential (primary) hypertension: Secondary | ICD-10-CM | POA: Diagnosis not present

## 2013-03-14 DIAGNOSIS — E119 Type 2 diabetes mellitus without complications: Secondary | ICD-10-CM | POA: Diagnosis not present

## 2013-03-14 DIAGNOSIS — R071 Chest pain on breathing: Secondary | ICD-10-CM | POA: Diagnosis not present

## 2013-03-14 DIAGNOSIS — Q233 Congenital mitral insufficiency: Secondary | ICD-10-CM | POA: Diagnosis not present

## 2013-03-14 DIAGNOSIS — R002 Palpitations: Secondary | ICD-10-CM | POA: Diagnosis not present

## 2013-03-26 ENCOUNTER — Other Ambulatory Visit: Payer: Self-pay | Admitting: Internal Medicine

## 2013-03-26 NOTE — Telephone Encounter (Signed)
Rx's was denied pt will be changing pharmacy.  Pt is aware that he is due a complete physical and will be scheduling.//AB/CMA

## 2013-04-07 ENCOUNTER — Other Ambulatory Visit: Payer: Self-pay | Admitting: Internal Medicine

## 2013-04-11 ENCOUNTER — Ambulatory Visit (INDEPENDENT_AMBULATORY_CARE_PROVIDER_SITE_OTHER): Payer: Medicare Other | Admitting: Internal Medicine

## 2013-04-11 ENCOUNTER — Encounter: Payer: Self-pay | Admitting: Internal Medicine

## 2013-04-11 ENCOUNTER — Ambulatory Visit (INDEPENDENT_AMBULATORY_CARE_PROVIDER_SITE_OTHER): Payer: Medicare Other

## 2013-04-11 VITALS — BP 130/60 | HR 71 | Temp 98.1°F | Ht 67.75 in | Wt 199.8 lb

## 2013-04-11 DIAGNOSIS — Z23 Encounter for immunization: Secondary | ICD-10-CM

## 2013-04-11 DIAGNOSIS — E039 Hypothyroidism, unspecified: Secondary | ICD-10-CM | POA: Diagnosis not present

## 2013-04-11 DIAGNOSIS — I10 Essential (primary) hypertension: Secondary | ICD-10-CM | POA: Diagnosis not present

## 2013-04-11 DIAGNOSIS — E1159 Type 2 diabetes mellitus with other circulatory complications: Secondary | ICD-10-CM

## 2013-04-11 DIAGNOSIS — E785 Hyperlipidemia, unspecified: Secondary | ICD-10-CM

## 2013-04-11 LAB — HEPATIC FUNCTION PANEL
ALT: 28 U/L (ref 0–53)
AST: 22 U/L (ref 0–37)
Albumin: 4.6 g/dL (ref 3.5–5.2)
Alkaline Phosphatase: 47 U/L (ref 39–117)
Bilirubin, Direct: 0.1 mg/dL (ref 0.0–0.3)
Total Bilirubin: 0.8 mg/dL (ref 0.3–1.2)
Total Protein: 7.8 g/dL (ref 6.0–8.3)

## 2013-04-11 LAB — LIPID PANEL
Cholesterol: 150 mg/dL (ref 0–200)
HDL: 47.2 mg/dL (ref >39.00)
LDL Cholesterol: 81 mg/dL (ref 0–99)
Total CHOL/HDL Ratio: 3
Triglycerides: 108 mg/dL (ref 0.0–149.0)
VLDL: 21.6 mg/dL (ref 0.0–40.0)

## 2013-04-11 LAB — BASIC METABOLIC PANEL
BUN: 21 mg/dL (ref 6–23)
CO2: 26 mEq/L (ref 19–32)
Calcium: 10 mg/dL (ref 8.4–10.5)
Chloride: 101 mEq/L (ref 96–112)
Creatinine, Ser: 1.1 mg/dL (ref 0.4–1.5)
GFR: 72.6 mL/min (ref >60.00)
Glucose, Bld: 156 mg/dL — ABNORMAL HIGH (ref 70–99)
Potassium: 4.6 mEq/L (ref 3.5–5.1)
Sodium: 137 mEq/L (ref 135–145)

## 2013-04-11 LAB — HEMOGLOBIN A1C: Hgb A1c MFr Bld: 7.4 % — ABNORMAL HIGH (ref 4.6–6.5)

## 2013-04-11 MED ORDER — AMLODIPINE BESYLATE 10 MG PO TABS: ORAL_TABLET | ORAL | Status: DC

## 2013-04-11 MED ORDER — QUINAPRIL HCL 40 MG PO TABS: ORAL_TABLET | ORAL | Status: DC

## 2013-04-11 MED ORDER — METFORMIN HCL ER 500 MG PO TB24: ORAL_TABLET | ORAL | Status: DC

## 2013-04-11 MED ORDER — REPAGLINIDE 1 MG PO TABS: 1.0000 mg | ORAL_TABLET | Freq: Three times a day (TID) | ORAL | Status: DC

## 2013-04-11 MED ORDER — LEVOTHYROXINE SODIUM 25 MCG PO TABS: ORAL_TABLET | ORAL | Status: DC

## 2013-04-11 NOTE — Patient Instructions (Addendum)
Your next office appointment will be determined based upon review of your pending labs . Those instructions will be transmitted to you through My Chart . Use Eucerin or Aveeno Daily  Moisturizing Lotion  twice a day  for the dry skin. Bathe with moisturizing liquid soap , not bar soap.

## 2013-04-11 NOTE — Progress Notes (Signed)
   Subjective:    Patient ID: [name], male    DOB: 1945/10/16, 68 y.o.   MRN: [id number]  HPI HYPERTENSION: Disease Monitoring: Blood pressure range/ average : 130/65-70 Medication Compliance:yes  Diabetes :  FBS range/average:113-150s Highest 2 hr post meal glucose:< 190 Medication compliance:yes Hypoglycemia:no Ophthamology care:UTD Podiatry care:UTD  HYPERLIPIDEMIA: Disease Monitoring: Medication Compliance:diet controlled; no statin @ present      Review of Systems  Chest pain, palpitations: cardioverted for PAT in [facility] 2/15     Dyspnea:no Edema:no Claudication: no Lightheadedness,Syncope:no Weight gain/loss:down 20# with diet change Polyuria/phagia/dipsia:  no  Blurred vision /diplopia/lossof vision:no Limb numbness/tingling/burning:no Non healing skin lesions: recent "bites" poorly healing Abd pain, bowel changes: no  Myalgias: no Memory loss:no       Objective:   Physical Exam Gen.: Healthy and well-nourished in appearance. Alert, appropriate and cooperative throughout exam.  Head: Normocephalic without obvious abnormalities;  no alopecia  Eyes: No corneal or conjunctival inflammation noted. Pupils equal round reactive to light and accommodation. Extraocular motion intact.  Ears: External  ear exam reveals no significant lesions or deformities. Canals clear .TMs normal. Hearing is grossly normal bilaterally. Nose: External nasal exam reveals no deformity or inflammation. Nasal mucosa are pink and moist. No lesions or exudates noted.   Mouth: Oral mucosa and oropharynx reveal no lesions or exudates. Teeth in good repair. Neck: No deformities, masses, or tenderness noted. Range of motion & Thyroid normal. Lungs: Normal respiratory effort; chest expands symmetrically. Lungs are clear to auscultation without rales, wheezes, or increased work of breathing. Heart: Normal rate and rhythm. Normal S1 and S2. No gallop, click, or rub.S4 w/o  murmur. Abdomen: Bowel sounds normal; abdomen soft and nontender. No masses, organomegaly or hernias noted. Genitalia: deferred                                  Musculoskeletal/extremities: No deformity or scoliosis noted of  the thoracic or lumbar spine.   No clubbing, cyanosis, edema, or significant extremity  deformity noted. Range of motion normal .Tone & strength normal. Hand joints normal  Fingernail  health good. Able to lie down & sit up w/o help. Negative SLR bilaterally Vascular: Carotid, radial artery, dorsalis pedis and  posterior tibial pulses are full and equal. No bruits present. Neurologic: Alert and oriented x3. Deep tendon reflexes symmetrical but 0-1/2 + @ knees Gait normal  Skin:Dry especially over legs; small scattered papules.Deeply tanned Lymph: No cervical, axillary lymphadenopathy present. Psych: Mood and affect are normal. Normally interactive                                                                                        Assessment & Plan:  See Current Assessment & Plan in Problem List under specific Diagnosis

## 2013-04-11 NOTE — Progress Notes (Signed)
Pre visit review using our clinic review tool, if applicable. No additional management support is needed unless otherwise documented below in the visit note. 

## 2013-04-12 NOTE — Assessment & Plan Note (Signed)
Lipids 

## 2013-04-12 NOTE — Assessment & Plan Note (Signed)
BP goals discussed BMET

## 2013-04-12 NOTE — Assessment & Plan Note (Signed)
TSH 

## 2013-04-12 NOTE — Assessment & Plan Note (Addendum)
Change to generic Prandin as per formulary coverage A1c BMET Lipids

## 2013-04-14 ENCOUNTER — Other Ambulatory Visit: Payer: Self-pay | Admitting: Internal Medicine

## 2013-04-14 DIAGNOSIS — E1159 Type 2 diabetes mellitus with other circulatory complications: Secondary | ICD-10-CM

## 2013-04-14 LAB — TSH: TSH: 2.87 u[IU]/mL (ref 0.35–5.50)

## 2013-04-14 LAB — MICROALBUMIN / CREATININE URINE RATIO
Creatinine,U: 280.7 mg/dL
Microalb Creat Ratio: 5 mg/g (ref 0.0–30.0)
Microalb, Ur: 13.9 mg/dL — ABNORMAL HIGH (ref 0.0–1.9)

## 2013-04-15 ENCOUNTER — Other Ambulatory Visit: Payer: Self-pay | Admitting: *Deleted

## 2013-04-15 DIAGNOSIS — E039 Hypothyroidism, unspecified: Secondary | ICD-10-CM

## 2013-04-15 DIAGNOSIS — I10 Essential (primary) hypertension: Secondary | ICD-10-CM

## 2013-04-15 DIAGNOSIS — E1159 Type 2 diabetes mellitus with other circulatory complications: Secondary | ICD-10-CM

## 2013-04-15 MED ORDER — QUINAPRIL HCL 40 MG PO TABS: ORAL_TABLET | ORAL | Status: DC

## 2013-04-15 MED ORDER — LEVOTHYROXINE SODIUM 25 MCG PO TABS: ORAL_TABLET | ORAL | Status: DC

## 2013-04-15 MED ORDER — METFORMIN HCL ER 500 MG PO TB24: ORAL_TABLET | ORAL | Status: DC

## 2013-04-15 MED ORDER — REPAGLINIDE 1 MG PO TABS: 1.0000 mg | ORAL_TABLET | Freq: Three times a day (TID) | ORAL | Status: DC

## 2013-04-15 MED ORDER — AMLODIPINE BESYLATE 10 MG PO TABS: ORAL_TABLET | ORAL | Status: DC

## 2013-04-16 ENCOUNTER — Encounter: Payer: Self-pay | Admitting: Internal Medicine

## 2013-04-16 DIAGNOSIS — T6391XA Toxic effect of contact with unspecified venomous animal, accidental (unintentional), initial encounter: Secondary | ICD-10-CM | POA: Diagnosis not present

## 2013-04-16 DIAGNOSIS — L659 Nonscarring hair loss, unspecified: Secondary | ICD-10-CM | POA: Diagnosis not present

## 2013-04-17 ENCOUNTER — Other Ambulatory Visit: Payer: Self-pay

## 2013-04-17 DIAGNOSIS — R29898 Other symptoms and signs involving the musculoskeletal system: Secondary | ICD-10-CM | POA: Diagnosis not present

## 2013-04-17 DIAGNOSIS — Z96649 Presence of unspecified artificial hip joint: Secondary | ICD-10-CM | POA: Diagnosis not present

## 2013-04-17 MED ORDER — DOFETILIDE 250 MCG PO CAPS: 250.0000 ug | ORAL_CAPSULE | Freq: Two times a day (BID) | ORAL | Status: DC

## 2013-04-19 ENCOUNTER — Encounter: Payer: Self-pay | Admitting: Internal Medicine

## 2013-04-19 DIAGNOSIS — I10 Essential (primary) hypertension: Secondary | ICD-10-CM

## 2013-04-19 DIAGNOSIS — E1159 Type 2 diabetes mellitus with other circulatory complications: Secondary | ICD-10-CM

## 2013-04-19 DIAGNOSIS — E039 Hypothyroidism, unspecified: Secondary | ICD-10-CM

## 2013-04-21 ENCOUNTER — Encounter: Payer: Self-pay | Admitting: Internal Medicine

## 2013-04-21 MED ORDER — QUINAPRIL HCL 40 MG PO TABS: ORAL_TABLET | ORAL | Status: DC

## 2013-04-21 MED ORDER — LEVOTHYROXINE SODIUM 25 MCG PO TABS: ORAL_TABLET | ORAL | Status: DC

## 2013-04-21 MED ORDER — REPAGLINIDE 1 MG PO TABS: 1.0000 mg | ORAL_TABLET | Freq: Three times a day (TID) | ORAL | Status: DC

## 2013-04-21 MED ORDER — DOFETILIDE 250 MCG PO CAPS: 250.0000 ug | ORAL_CAPSULE | Freq: Two times a day (BID) | ORAL | Status: DC

## 2013-04-21 MED ORDER — AMLODIPINE BESYLATE 10 MG PO TABS: ORAL_TABLET | ORAL | Status: DC

## 2013-04-21 MED ORDER — METFORMIN HCL ER 500 MG PO TB24: ORAL_TABLET | ORAL | Status: DC

## 2013-04-24 ENCOUNTER — Encounter: Payer: Self-pay | Admitting: Internal Medicine

## 2013-05-11 ENCOUNTER — Encounter: Payer: Self-pay | Admitting: Internal Medicine

## 2013-05-21 ENCOUNTER — Ambulatory Visit (INDEPENDENT_AMBULATORY_CARE_PROVIDER_SITE_OTHER): Payer: Medicare Other | Admitting: Internal Medicine

## 2013-05-21 ENCOUNTER — Encounter: Payer: Self-pay | Admitting: Internal Medicine

## 2013-05-21 VITALS — BP 130/78 | HR 118 | Ht 68.0 in | Wt 201.8 lb

## 2013-05-21 DIAGNOSIS — I10 Essential (primary) hypertension: Secondary | ICD-10-CM

## 2013-05-21 DIAGNOSIS — I4891 Unspecified atrial fibrillation: Secondary | ICD-10-CM

## 2013-05-21 LAB — MDC_IDC_ENUM_SESS_TYPE_INCLINIC: Implantable Pulse Generator Model: 9526

## 2013-05-21 MED ORDER — DILTIAZEM HCL ER COATED BEADS 120 MG PO CP24: 120.0000 mg | ORAL_CAPSULE | Freq: Every day | ORAL | Status: DC

## 2013-05-21 NOTE — Patient Instructions (Addendum)
Your physician recommends that you schedule a follow-up appointment in: 8 weeks with Dr Rayann Heman   Your physician has recommended you make the following change in your medication:  1) Start Cardizem 120mg    Dr Chinita Pester office will call patient to discuss appointment ---will be about 3 weeks to get in .  Spoke with patient's wife and she is aware

## 2013-05-25 NOTE — Progress Notes (Signed)
PCP:  Unice Cobble, MD  The patient presents today for electrophysiology followup.   He continues to struggle with atrial fibrillation and atypical flutter.  He was cardioverted 07/18/12.  He did well until 1/15 when he developed recurrent atrial arrhythmias in [place] for which he was cardioverted.  Most recently, he was on a cruise and developed recurrent tachypalpitations.  He presents today and is found to have atypical atrial flutter.  He reports fatigue and decreased exercise tolerance with this.  Today, he denies symptoms of chest pain, shortness of breath, orthopnea, PND, lower extremity edema, dizziness, presyncope, syncope, or neurologic sequela.  The patient feels that he is tolerating medications without difficulties and is otherwise without complaint today.   Past Medical History  Diagnosis Date  . Diabetes mellitus     type 2  . Hyperlipidemia   . Hypertension   . Allergic rhinitis     seasonal  . Hypothyroidism   . AF (atrial fibrillation)     persistent afib, s/p afib ablation x 2 with subsequent convergent procedure at [facility]  . CTS (carpal tunnel syndrome)     PMH of  . Renal calculus 2005    [facility]  . Atrial fibrillation    Past Surgical History  Procedure Laterality Date  . Cardioversion    . Afib 09/22/2008--11/03/2009      afib ablation x 2 by Dr Rayann Heman with subsequent convergent procedure at [facility]  . Total hip arthroplasty  06/2010    Depuy prosthesis replacement, Dr Alvan Dame  . Cardioversion  12/09/2010    Procedure: CARDIOVERSION;  Surgeon: Loralie Champagne, MD;  Location: [place];  Service: Cardiovascular;  Laterality: N/A;  . Total hip arthroplasty Right 2008     Depuy;Dr Alvan Dame  . Tonsillectomy    . Cardioversion  09/15/2011    Procedure: CARDIOVERSION;  Surgeon: Josue Hector, MD;  Location: [place];  Service: Cardiovascular;  Laterality: N/A;  office drawing labs wed 8/28  . Loop recorder implant Left 12-2010  . Cardiac catheterization  2005    non  obstructive CAD  . Convergent procedure  2013    at [facility] for a fib  . Cardioversion N/A 07/18/2012    Procedure: CARDIOVERSION;  Surgeon: Thayer Headings, MD;  Location: [facility] ENDOSCOPY;  Service: Cardiovascular;  Laterality: N/A;  . Joint replacement Right 2008, 2012    Current Outpatient Prescriptions  Medication Sig Dispense Refill  . AMBULATORY NON FORMULARY MEDICATION OCUVITE, 1 by mouth daily      . amLODipine (NORVASC) 10 MG tablet TAKE 1 TABLET DAILY  90 tablet  1  . dofetilide (TIKOSYN) 250 MCG capsule Take 1 capsule (250 mcg total) by mouth 2 (two) times daily.  180 capsule  2  . levothyroxine (SYNTHROID, LEVOTHROID) 25 MCG tablet TAKE ONE AND ONE-HALF TABLETS EVERY TUESDAY, THURSDAY AND SATURDAY AND 1 TABLET ON ALL OTHER DAYS OF THE WEEK.  126 tablet  1  . linagliptin (TRADJENTA) 5 MG TABS tablet Take 5 mg by mouth daily.      . Magnesium Oxide 400 MG CAPS Take 1 capsule (400 mg total) by mouth 2 (two) times daily.  180 capsule  3  . metFORMIN (GLUCOPHAGE-XR) 500 MG 24 hr tablet TAKE 2 TABLETS (1000MG ) TWICE A DAY WITH MEALS  360 tablet  1  . metoprolol succinate (TOPROL-XL) 100 MG 24 hr tablet Take 100 mg by mouth 2 (two) times daily.      . Multiple Vitamin (MULTIVITAMIN) capsule Take 1 capsule by  mouth daily.        . quinapril (ACCUPRIL) 40 MG tablet TAKE 1 TABLET DAILY  90 tablet  1  . Rivaroxaban (XARELTO) 20 MG TABS tablet Take 1 tablet (20 mg total) by mouth daily.  90 tablet  3  . diltiazem (CARDIZEM CD) 120 MG 24 hr capsule Take 1 capsule (120 mg total) by mouth daily.  90 capsule  3  . repaglinide (PRANDIN) 1 MG tablet Take 1 mg by mouth 3 (three) times daily before meals. Pt has not started this medication yet (05/21/13)       No current facility-administered medications for this visit.    No Known Allergies  History   Social History  . Marital Status: Married    Spouse Name: Izora Gala    Number of Children: Y  . Years of Education: N/A   Occupational History  . BUYER     Social History Main Topics  . Smoking status: Former Smoker -- 0.90 packs/day for 8 years    Types: Cigarettes    Quit date: 01/16/1974  . Smokeless tobacco: Not on file  . Alcohol Use: Yes     Comment: Socially; 2 glasses of wine / month  . Drug Use: No  . Sexual Activity: Not on file   Other Topics Concern  . Not on file   Social History Narrative  . No narrative on file    Family History  Problem Relation Age of Onset  . Lung cancer Mother     non smoker  . Stroke Mother 51  . Diabetes Father   . Stroke Father     mid 79s  . Testicular cancer Brother   . Allergies Maternal Grandmother   . Cancer Maternal Grandmother     unsure  . Allergies Brother   . Heart disease Neg Hx     ROS-  All systems are reviewed and are negative except as outlined in the HPI above   Physical Exam: Filed Vitals:   05/21/13 1514  BP: 130/78  Pulse: 118  Height: 5\' 8"  (1.727 m)  Weight: 201 lb 12.8 oz (91.536 kg)    GEN- The patient is well appearing, alert and oriented x 3 today.   Head- normocephalic, atraumatic Eyes-  Sclera clear, conjunctiva pink Ears- hearing intact Oropharynx- clear Neck- supple, no JVP Lymph- no cervical lymphadenopathy Lungs- Clear to ausculation bilaterally, normal work of breathing Heart- irregular rate and rhythm, no murmurs, rubs or gallops, PMI not laterally displaced GI- soft, NT, ND, + BS Extremities- no clubbing, cyanosis, or edema MS- no significant deformity or atrophy Skin- no rash or lesion Psych- euthymic mood, full affect Neuro- strength and sensation are intact  ekg today reveals atypical atrial flutter, V rate 118 bpm, RBBB  Assessment and Plan:  1. afib ILR interrogation today is reviewed and reveals return of atypical atrial flutter with RVR.  He is symptomatic with this. He reports compliance with tikosyn.  He is appropriately anticoagulated with xarelto. I will add diltiazem for better rate control.  I have advised that  he have repeat ablation by Dr Lehman Prom.  I have discussed his case with Dr Lehman Prom today and he would like to keep the patient in atrial flutter until the procedure so that mapping of the clinical tachycardia can be performed.  I think that this is very reasonable and the patient appears to be tolerating the tachycardia fairly well.  Dr Anders Simmonds office with arrange for follow-up in 2-3 weeks.  2. HTN  Stable No change required today  I will see in 2-3 months in follow-up after he has been evaluated by Dr Lehman Prom

## 2013-06-16 HISTORY — PX: ABLATION: SHX5711

## 2013-06-17 ENCOUNTER — Encounter: Payer: Self-pay | Admitting: Internal Medicine

## 2013-06-25 DIAGNOSIS — I359 Nonrheumatic aortic valve disorder, unspecified: Secondary | ICD-10-CM | POA: Diagnosis not present

## 2013-06-25 DIAGNOSIS — I10 Essential (primary) hypertension: Secondary | ICD-10-CM | POA: Diagnosis not present

## 2013-06-25 DIAGNOSIS — E669 Obesity, unspecified: Secondary | ICD-10-CM | POA: Diagnosis not present

## 2013-06-25 DIAGNOSIS — I4891 Unspecified atrial fibrillation: Secondary | ICD-10-CM | POA: Diagnosis not present

## 2013-06-25 DIAGNOSIS — I4892 Unspecified atrial flutter: Secondary | ICD-10-CM | POA: Diagnosis not present

## 2013-06-25 DIAGNOSIS — E119 Type 2 diabetes mellitus without complications: Secondary | ICD-10-CM | POA: Diagnosis not present

## 2013-06-26 DIAGNOSIS — Z96659 Presence of unspecified artificial knee joint: Secondary | ICD-10-CM | POA: Diagnosis not present

## 2013-06-26 DIAGNOSIS — E119 Type 2 diabetes mellitus without complications: Secondary | ICD-10-CM | POA: Diagnosis not present

## 2013-06-26 DIAGNOSIS — E785 Hyperlipidemia, unspecified: Secondary | ICD-10-CM | POA: Diagnosis not present

## 2013-06-26 DIAGNOSIS — E039 Hypothyroidism, unspecified: Secondary | ICD-10-CM | POA: Diagnosis not present

## 2013-06-26 DIAGNOSIS — I4892 Unspecified atrial flutter: Secondary | ICD-10-CM | POA: Diagnosis not present

## 2013-06-26 DIAGNOSIS — I10 Essential (primary) hypertension: Secondary | ICD-10-CM | POA: Diagnosis not present

## 2013-06-26 DIAGNOSIS — I4891 Unspecified atrial fibrillation: Secondary | ICD-10-CM | POA: Diagnosis not present

## 2013-06-27 DIAGNOSIS — E039 Hypothyroidism, unspecified: Secondary | ICD-10-CM | POA: Diagnosis not present

## 2013-06-27 DIAGNOSIS — I4892 Unspecified atrial flutter: Secondary | ICD-10-CM | POA: Diagnosis not present

## 2013-06-27 DIAGNOSIS — I4891 Unspecified atrial fibrillation: Secondary | ICD-10-CM | POA: Diagnosis not present

## 2013-06-27 DIAGNOSIS — E785 Hyperlipidemia, unspecified: Secondary | ICD-10-CM | POA: Diagnosis not present

## 2013-06-27 DIAGNOSIS — E119 Type 2 diabetes mellitus without complications: Secondary | ICD-10-CM | POA: Diagnosis not present

## 2013-06-27 DIAGNOSIS — I10 Essential (primary) hypertension: Secondary | ICD-10-CM | POA: Diagnosis not present

## 2013-07-07 ENCOUNTER — Encounter: Payer: Self-pay | Admitting: Internal Medicine

## 2013-07-09 ENCOUNTER — Other Ambulatory Visit: Payer: Self-pay | Admitting: Internal Medicine

## 2013-07-09 ENCOUNTER — Telehealth: Payer: Self-pay

## 2013-07-09 NOTE — Telephone Encounter (Signed)
Patient's wife called for samples of xarelto placed samples Up front

## 2013-07-10 MED ORDER — METOPROLOL SUCCINATE ER 100 MG PO TB24: ORAL_TABLET | ORAL | Status: DC

## 2013-07-30 ENCOUNTER — Encounter: Payer: Medicare Other | Admitting: Internal Medicine

## 2013-08-05 DIAGNOSIS — H21559 Recession of chamber angle, unspecified eye: Secondary | ICD-10-CM | POA: Diagnosis not present

## 2013-08-05 DIAGNOSIS — H35029 Exudative retinopathy, unspecified eye: Secondary | ICD-10-CM | POA: Diagnosis not present

## 2013-08-07 ENCOUNTER — Other Ambulatory Visit: Payer: Self-pay

## 2013-08-08 ENCOUNTER — Encounter: Payer: Self-pay | Admitting: Internal Medicine

## 2013-08-08 ENCOUNTER — Ambulatory Visit (INDEPENDENT_AMBULATORY_CARE_PROVIDER_SITE_OTHER): Payer: Medicare Other | Admitting: Internal Medicine

## 2013-08-08 VITALS — BP 134/80 | HR 67 | Ht 68.0 in | Wt 199.0 lb

## 2013-08-08 DIAGNOSIS — I4891 Unspecified atrial fibrillation: Secondary | ICD-10-CM | POA: Diagnosis not present

## 2013-08-08 DIAGNOSIS — I10 Essential (primary) hypertension: Secondary | ICD-10-CM

## 2013-08-08 LAB — MDC_IDC_ENUM_SESS_TYPE_INCLINIC: Implantable Pulse Generator Model: 9526

## 2013-08-08 NOTE — Progress Notes (Signed)
PCP:  Unice Cobble, MD  The patient presents today for routine electrophysiology followup.  He is s/p atypical atrial flutter ablation by Dr Lehman Prom 06/2013.  Since ablation, he has had significantly less atrial arrhythmias and is symptomatically improved.    Today, he denies symptoms of palpitations, chest pain, shortness of breath, orthopnea, PND, lower extremity edema, dizziness, presyncope, syncope, or neurologic sequela.  The patient feels that he is tolerating medications without difficulties and is otherwise without complaint today.   Past Medical History  Diagnosis Date  . Diabetes mellitus     type 2  . Hyperlipidemia   . Hypertension   . Allergic rhinitis     seasonal  . Hypothyroidism   . AF (atrial fibrillation)     persistent afib, s/p afib ablation x 2 with subsequent convergent procedure at [facility]  . CTS (carpal tunnel syndrome)     PMH of  . Renal calculus 2005    [facility]  . Atrial fibrillation   . Atrial flutter    Past Surgical History  Procedure Laterality Date  . Cardioversion    . Afib 09/22/2008--11/03/2009      afib ablation x 2 by Dr Rayann Heman with subsequent convergent procedure at [facility]  . Total hip arthroplasty  06/2010    Depuy prosthesis replacement, Dr Alvan Dame  . Cardioversion  12/09/2010    Procedure: CARDIOVERSION;  Surgeon: Loralie Champagne, MD;  Location: [place];  Service: Cardiovascular;  Laterality: N/A;  . Total hip arthroplasty Right 2008     Depuy;Dr Alvan Dame  . Tonsillectomy    . Cardioversion  09/15/2011    Procedure: CARDIOVERSION;  Surgeon: Josue Hector, MD;  Location: [place];  Service: Cardiovascular;  Laterality: N/A;  office drawing labs wed 8/28  . Loop recorder implant Left 12-2010  . Cardiac catheterization  2005    non obstructive CAD  . Convergent procedure  2013    at [facility] for a fib  . Cardioversion N/A 07/18/2012    Procedure: CARDIOVERSION;  Surgeon: Thayer Headings, MD;  Location: [place];  Service: Cardiovascular;   Laterality: N/A;  . Joint replacement Right 2008, 2012  . Ablation  06/2013    atypical atrial flutter ablation by Dr Lehman Prom at [facility] (mitral annular flutter ablated)    Current Outpatient Prescriptions  Medication Sig Dispense Refill  . AMBULATORY NON FORMULARY MEDICATION OCUVITE, Take 1 tablet by mouth daily      . amLODipine (NORVASC) 10 MG tablet TAKE 1 TABLET DAILY  90 tablet  1  . dofetilide (TIKOSYN) 250 MCG capsule Take 1 capsule (250 mcg total) by mouth 2 (two) times daily.  180 capsule  2  . levothyroxine (SYNTHROID, LEVOTHROID) 25 MCG tablet TAKE ONE AND ONE-HALF TABLETS EVERY TUESDAY, THURSDAY AND SATURDAY AND 1 TABLET ON ALL OTHER DAYS OF THE WEEK.  126 tablet  1  . Magnesium Oxide 400 MG CAPS Take 1 capsule (400 mg total) by mouth 2 (two) times daily.  180 capsule  3  . metFORMIN (GLUCOPHAGE-XR) 500 MG 24 hr tablet TAKE 2 TABLETS (1000MG ) TWICE A DAY WITH MEALS  360 tablet  1  . metoprolol succinate (TOPROL-XL) 100 MG 24 hr tablet Take one twice daily and an extra 1/2 tablet daily as needed for afib  225 tablet  3  . Multiple Vitamin (MULTIVITAMIN) capsule Take 1 capsule by mouth daily.        . quinapril (ACCUPRIL) 40 MG tablet TAKE 1 TABLET DAILY  90 tablet  1  . repaglinide (PRANDIN) 1 MG tablet Take 1 mg by mouth 3 (three) times daily before meals. Pt has not started this medication yet (05/21/13)      . Rivaroxaban (XARELTO) 20 MG TABS tablet Take 1 tablet (20 mg total) by mouth daily.  90 tablet  3   No current facility-administered medications for this visit.    No Known Allergies  History   Social History  . Marital Status: Married    Spouse Name: Izora Gala    Number of Children: Y  . Years of Education: N/A   Occupational History  . BUYER    Social History Main Topics  . Smoking status: Former Smoker -- 0.90 packs/day for 8 years    Types: Cigarettes    Quit date: 01/16/1974  . Smokeless tobacco: Not on file  . Alcohol Use: Yes     Comment: Socially; 2 glasses  of wine / month  . Drug Use: No  . Sexual Activity: Not on file   Other Topics Concern  . Not on file   Social History Narrative  . No narrative on file    Family History  Problem Relation Age of Onset  . Lung cancer Mother     non smoker  . Stroke Mother 52  . Diabetes Father   . Stroke Father     mid 6s  . Testicular cancer Brother   . Allergies Maternal Grandmother   . Cancer Maternal Grandmother     unsure  . Allergies Brother   . Heart disease Neg Hx     ROS-  All systems are reviewed and are negative except as outlined in the HPI above  Physical Exam: Filed Vitals:   08/08/13 1146  BP: 134/80  Pulse: 67  Height: 5\' 8"  (1.727 m)  Weight: 199 lb (90.266 kg)    GEN- The patient is well appearing, alert and oriented x 3 today.   Head- normocephalic, atraumatic Eyes-  Sclera clear, conjunctiva pink Ears- hearing intact Oropharynx- clear Neck- supple, no JVP Lymph- no cervical lymphadenopathy Lungs- Clear to ausculation bilaterally, normal work of breathing Heart- Regular rate and rhythm, no murmurs, rubs or gallops, PMI not laterally displaced GI- soft, NT, ND, + BS Extremities- no clubbing, cyanosis, or edema Neuro- strength and sensation are intact  Implantable loop recorder - interrogation reviewed - see paper chart for full details ekg today reveals sinus rhythm RBBB  Assessment and Plan: 1.  Atrial fibrillation/atrial flutter Well controlled post ablation No changes today Would keep on tikosyn for now  2.  HTN Stable No change required today

## 2013-08-08 NOTE — Patient Instructions (Signed)
Your physician wants you to follow-up in: 6 months with Dr. Allred. You will receive a reminder letter in the mail two months in advance. If you don't receive a letter, please call our office to schedule the follow-up appointment.  

## 2013-08-10 ENCOUNTER — Encounter: Payer: Self-pay | Admitting: Internal Medicine

## 2013-09-08 ENCOUNTER — Encounter: Payer: Self-pay | Admitting: Internal Medicine

## 2013-09-11 ENCOUNTER — Ambulatory Visit (INDEPENDENT_AMBULATORY_CARE_PROVIDER_SITE_OTHER): Payer: Medicare Other | Admitting: Podiatry

## 2013-09-11 ENCOUNTER — Encounter: Payer: Self-pay | Admitting: Podiatry

## 2013-09-11 VITALS — BP 145/73 | HR 66 | Resp 17

## 2013-09-11 DIAGNOSIS — M7750 Other enthesopathy of unspecified foot: Secondary | ICD-10-CM

## 2013-09-11 DIAGNOSIS — G589 Mononeuropathy, unspecified: Secondary | ICD-10-CM

## 2013-09-11 NOTE — Progress Notes (Signed)
Subjective:     Patient ID: [name], male   DOB: 11-Jun-1945, 68 y.o.   MRN: [id number]  HPI patient states that I'm still getting some numbness in my big toe left and I wanted to see if I'm eligible for diabetic shoes   Review of Systems     Objective:   Physical Exam Neurovascular status unchanged from previous visit   with mild small amount keratotic lesion on the left hallux and well-controlled diabetes with A1c around 7 and no indications currently of advanced and neurological disease or vascular disease with good pulses noted Assessment:     Patient is not appear to meet requirements for diabetic shoes which I educated him on and does have small amount of irritation left big toe    Plan:     Applied padding to cushion the left big toe and explained on the continuation of good diabetic care and the importance of this

## 2013-09-11 NOTE — Progress Notes (Signed)
   Subjective:    Patient ID: [name], male    DOB: 1945/06/07, 68 y.o.   MRN: [id number]  HPI  Pt presents with left great toe pain, he states that it is irritating type pain, has had this problem for 2 months. Also is interested in diabetic shoes  Review of Systems     Objective:   Physical Exam        Assessment & Plan:

## 2013-09-24 ENCOUNTER — Ambulatory Visit (INDEPENDENT_AMBULATORY_CARE_PROVIDER_SITE_OTHER): Payer: BC Managed Care – PPO | Admitting: Ophthalmology

## 2013-10-02 ENCOUNTER — Ambulatory Visit (INDEPENDENT_AMBULATORY_CARE_PROVIDER_SITE_OTHER): Payer: Medicare Other | Admitting: Ophthalmology

## 2013-10-02 DIAGNOSIS — H35039 Hypertensive retinopathy, unspecified eye: Secondary | ICD-10-CM

## 2013-10-02 DIAGNOSIS — H43819 Vitreous degeneration, unspecified eye: Secondary | ICD-10-CM

## 2013-10-02 DIAGNOSIS — E1165 Type 2 diabetes mellitus with hyperglycemia: Secondary | ICD-10-CM

## 2013-10-02 DIAGNOSIS — E1139 Type 2 diabetes mellitus with other diabetic ophthalmic complication: Secondary | ICD-10-CM | POA: Diagnosis not present

## 2013-10-02 DIAGNOSIS — E11319 Type 2 diabetes mellitus with unspecified diabetic retinopathy without macular edema: Secondary | ICD-10-CM | POA: Diagnosis not present

## 2013-10-02 DIAGNOSIS — I10 Essential (primary) hypertension: Secondary | ICD-10-CM

## 2013-10-06 ENCOUNTER — Encounter: Payer: Self-pay | Admitting: Internal Medicine

## 2013-10-07 ENCOUNTER — Other Ambulatory Visit: Payer: Self-pay

## 2013-10-07 ENCOUNTER — Encounter: Payer: Self-pay | Admitting: Internal Medicine

## 2013-10-07 ENCOUNTER — Other Ambulatory Visit: Payer: Self-pay | Admitting: Internal Medicine

## 2013-10-07 DIAGNOSIS — E1159 Type 2 diabetes mellitus with other circulatory complications: Secondary | ICD-10-CM

## 2013-10-07 DIAGNOSIS — I10 Essential (primary) hypertension: Secondary | ICD-10-CM

## 2013-10-07 DIAGNOSIS — E039 Hypothyroidism, unspecified: Secondary | ICD-10-CM

## 2013-10-07 MED ORDER — NATEGLINIDE 120 MG PO TABS
120.0000 mg | ORAL_TABLET | Freq: Three times a day (TID) | ORAL | Status: DC
Start: 2013-10-07 — End: 2014-01-27

## 2013-10-07 MED ORDER — AMLODIPINE BESYLATE 10 MG PO TABS: ORAL_TABLET | ORAL | Status: DC

## 2013-10-07 MED ORDER — LEVOTHYROXINE SODIUM 25 MCG PO TABS: ORAL_TABLET | ORAL | Status: DC

## 2013-10-07 MED ORDER — METFORMIN HCL ER 500 MG PO TB24: ORAL_TABLET | ORAL | Status: DC

## 2013-10-07 MED ORDER — DOFETILIDE 250 MCG PO CAPS: 250.0000 ug | ORAL_CAPSULE | Freq: Two times a day (BID) | ORAL | Status: DC

## 2013-10-08 ENCOUNTER — Other Ambulatory Visit (INDEPENDENT_AMBULATORY_CARE_PROVIDER_SITE_OTHER): Payer: Medicare Other

## 2013-10-08 DIAGNOSIS — E1159 Type 2 diabetes mellitus with other circulatory complications: Secondary | ICD-10-CM

## 2013-10-08 LAB — MICROALBUMIN / CREATININE URINE RATIO
Creatinine,U: 272.3 mg/dL
Microalb Creat Ratio: 9.7 mg/g (ref 0.0–30.0)
Microalb, Ur: 26.3 mg/dL — ABNORMAL HIGH (ref 0.0–1.9)

## 2013-10-08 LAB — HEMOGLOBIN A1C: Hgb A1c MFr Bld: 6.5 % (ref 4.6–6.5)

## 2013-10-22 ENCOUNTER — Encounter: Payer: Self-pay | Admitting: Internal Medicine

## 2013-10-24 ENCOUNTER — Encounter: Payer: Self-pay | Admitting: Internal Medicine

## 2013-11-24 ENCOUNTER — Encounter: Payer: Self-pay | Admitting: Internal Medicine

## 2013-11-26 ENCOUNTER — Encounter: Payer: Self-pay | Admitting: Internal Medicine

## 2013-11-27 DIAGNOSIS — Z23 Encounter for immunization: Secondary | ICD-10-CM | POA: Diagnosis not present

## 2013-12-09 ENCOUNTER — Encounter: Payer: Self-pay | Admitting: Internal Medicine

## 2013-12-10 ENCOUNTER — Other Ambulatory Visit: Payer: Self-pay | Admitting: *Deleted

## 2013-12-10 MED ORDER — RIVAROXABAN 20 MG PO TABS: 20.0000 mg | ORAL_TABLET | Freq: Every day | ORAL | Status: DC

## 2013-12-16 ENCOUNTER — Encounter: Payer: Self-pay | Admitting: Internal Medicine

## 2013-12-16 ENCOUNTER — Telehealth: Payer: Self-pay | Admitting: *Deleted

## 2013-12-16 NOTE — Telephone Encounter (Signed)
PA started with insurance company for [place], will have decision within 72 hours.

## 2013-12-17 ENCOUNTER — Encounter: Payer: Self-pay | Admitting: Internal Medicine

## 2013-12-17 NOTE — Telephone Encounter (Signed)
PA has been approved through 12/17/2014. Patient states he may be changing to mail order in January, assured him we would help him transition his prescriptions over.

## 2013-12-29 ENCOUNTER — Encounter: Payer: Self-pay | Admitting: Internal Medicine

## 2013-12-29 ENCOUNTER — Other Ambulatory Visit: Payer: Self-pay | Admitting: Internal Medicine

## 2013-12-29 DIAGNOSIS — I10 Essential (primary) hypertension: Secondary | ICD-10-CM

## 2013-12-29 DIAGNOSIS — E039 Hypothyroidism, unspecified: Secondary | ICD-10-CM

## 2013-12-29 MED ORDER — QUINAPRIL HCL 40 MG PO TABS: ORAL_TABLET | ORAL | Status: DC

## 2013-12-29 MED ORDER — LEVOTHYROXINE SODIUM 25 MCG PO TABS: ORAL_TABLET | ORAL | Status: DC

## 2014-01-19 ENCOUNTER — Encounter: Payer: Self-pay | Admitting: Internal Medicine

## 2014-01-20 ENCOUNTER — Other Ambulatory Visit: Payer: Self-pay | Admitting: *Deleted

## 2014-01-20 MED ORDER — RIVAROXABAN 20 MG PO TABS: 20.0000 mg | ORAL_TABLET | Freq: Every day | ORAL | Status: DC

## 2014-01-27 ENCOUNTER — Encounter: Payer: Self-pay | Admitting: Internal Medicine

## 2014-01-27 MED ORDER — NATEGLINIDE 120 MG PO TABS: 120.0000 mg | ORAL_TABLET | Freq: Three times a day (TID) | ORAL | Status: DC

## 2014-02-27 ENCOUNTER — Telehealth: Payer: Self-pay

## 2014-02-27 NOTE — Telephone Encounter (Signed)
LVM for pt to call back.   RE: Flu vaccine for 2015/2016

## 2014-03-02 ENCOUNTER — Other Ambulatory Visit: Payer: Self-pay | Admitting: Internal Medicine

## 2014-03-02 ENCOUNTER — Other Ambulatory Visit: Payer: Self-pay

## 2014-03-02 ENCOUNTER — Telehealth: Payer: Self-pay | Admitting: Internal Medicine

## 2014-03-02 DIAGNOSIS — E785 Hyperlipidemia, unspecified: Secondary | ICD-10-CM

## 2014-03-02 DIAGNOSIS — I10 Essential (primary) hypertension: Secondary | ICD-10-CM

## 2014-03-02 DIAGNOSIS — E1151 Type 2 diabetes mellitus with diabetic peripheral angiopathy without gangrene: Secondary | ICD-10-CM

## 2014-03-02 DIAGNOSIS — E038 Other specified hypothyroidism: Secondary | ICD-10-CM

## 2014-03-02 NOTE — Telephone Encounter (Signed)
Immunization history entered.

## 2014-03-02 NOTE — Telephone Encounter (Signed)
Pt did get flu shot oct 15th at [facility] at [place].

## 2014-03-03 NOTE — Progress Notes (Addendum)
Left message for patient to return call. Patient needs to be advised that he needs fasting labs and follow up office visit. Bring all pill bottles and 2016 drug formulary info

## 2014-03-04 ENCOUNTER — Encounter: Payer: Medicare Other | Admitting: Internal Medicine

## 2014-03-17 ENCOUNTER — Other Ambulatory Visit (INDEPENDENT_AMBULATORY_CARE_PROVIDER_SITE_OTHER): Payer: Medicare Other

## 2014-03-17 DIAGNOSIS — E1151 Type 2 diabetes mellitus with diabetic peripheral angiopathy without gangrene: Secondary | ICD-10-CM

## 2014-03-17 DIAGNOSIS — E038 Other specified hypothyroidism: Secondary | ICD-10-CM

## 2014-03-17 DIAGNOSIS — I10 Essential (primary) hypertension: Secondary | ICD-10-CM | POA: Diagnosis not present

## 2014-03-17 DIAGNOSIS — E785 Hyperlipidemia, unspecified: Secondary | ICD-10-CM

## 2014-03-17 LAB — MICROALBUMIN / CREATININE URINE RATIO
Creatinine,U: 388 mg/dL
Microalb Creat Ratio: 2.1 mg/g (ref 0.0–30.0)
Microalb, Ur: 8.3 mg/dL — ABNORMAL HIGH (ref 0.0–1.9)

## 2014-03-17 LAB — BASIC METABOLIC PANEL
BUN: 30 mg/dL — ABNORMAL HIGH (ref 6–23)
CO2: 27 mEq/L (ref 19–32)
Calcium: 10.8 mg/dL — ABNORMAL HIGH (ref 8.4–10.5)
Chloride: 100 mEq/L (ref 96–112)
Creatinine, Ser: 1.26 mg/dL (ref 0.40–1.50)
GFR: 60.6 mL/min (ref >60.00)
Glucose, Bld: 157 mg/dL — ABNORMAL HIGH (ref 70–99)
Potassium: 5.7 mEq/L — ABNORMAL HIGH (ref 3.5–5.1)
Sodium: 137 mEq/L (ref 135–145)

## 2014-03-17 LAB — HEMOGLOBIN A1C: Hgb A1c MFr Bld: 7.5 % — ABNORMAL HIGH (ref 4.6–6.5)

## 2014-03-17 LAB — LIPID PANEL
Cholesterol: 163 mg/dL (ref 0–200)
HDL: 43.8 mg/dL (ref >39.00)
LDL Cholesterol: 86 mg/dL (ref 0–99)
NonHDL: 119.2
Total CHOL/HDL Ratio: 4
Triglycerides: 165 mg/dL — ABNORMAL HIGH (ref 0.0–149.0)
VLDL: 33 mg/dL (ref 0.0–40.0)

## 2014-03-17 LAB — HEPATIC FUNCTION PANEL
ALT: 31 U/L (ref 0–53)
AST: 23 U/L (ref 0–37)
Albumin: 4.9 g/dL (ref 3.5–5.2)
Alkaline Phosphatase: 50 U/L (ref 39–117)
Bilirubin, Direct: 0.1 mg/dL (ref 0.0–0.3)
Total Bilirubin: 0.7 mg/dL (ref 0.2–1.2)
Total Protein: 8.3 g/dL (ref 6.0–8.3)

## 2014-03-17 LAB — TSH: TSH: 4.38 u[IU]/mL (ref 0.35–4.50)

## 2014-03-23 ENCOUNTER — Ambulatory Visit (INDEPENDENT_AMBULATORY_CARE_PROVIDER_SITE_OTHER): Payer: Medicare Other | Admitting: Internal Medicine

## 2014-03-23 ENCOUNTER — Telehealth: Payer: Self-pay | Admitting: Internal Medicine

## 2014-03-23 ENCOUNTER — Encounter: Payer: Self-pay | Admitting: Internal Medicine

## 2014-03-23 VITALS — BP 148/72 | HR 82 | Temp 97.9°F | Ht 68.0 in | Wt 203.8 lb

## 2014-03-23 DIAGNOSIS — E785 Hyperlipidemia, unspecified: Secondary | ICD-10-CM | POA: Diagnosis not present

## 2014-03-23 DIAGNOSIS — R7989 Other specified abnormal findings of blood chemistry: Secondary | ICD-10-CM | POA: Diagnosis not present

## 2014-03-23 DIAGNOSIS — I10 Essential (primary) hypertension: Secondary | ICD-10-CM | POA: Diagnosis not present

## 2014-03-23 DIAGNOSIS — I4891 Unspecified atrial fibrillation: Secondary | ICD-10-CM

## 2014-03-23 DIAGNOSIS — E1151 Type 2 diabetes mellitus with diabetic peripheral angiopathy without gangrene: Secondary | ICD-10-CM | POA: Diagnosis not present

## 2014-03-23 NOTE — Assessment & Plan Note (Signed)
Consume less than 40 Grams (preferably ZERO) of sugar per day from foods & drinks with High Fructose Corn Syrup (HFCS) sugar as #1,2,3 or # 4 on label.Whole Foods, [employer] [place] do not carry products with HFCS.

## 2014-03-23 NOTE — Assessment & Plan Note (Signed)
Asked to stop any oral calcium supplements as well as vitamin D. Recheck a  fasting calcium after 8 weeks off these supplements. If the calcium remains elevated off supplements,  the calcium wil be repeated  with a simultaneous  parathyroid hormone level.

## 2014-03-23 NOTE — Assessment & Plan Note (Signed)
A1c goal = < 7% w/o hypoglycemia  Instructed to avoid excess white carbohydrates (potatoes, rice, bread, and pasta) which cause a high spike & load of sugar . Pathophysiology of pancreas excreting excess insulin in response to the high spike & load of sugar discussed . Over time the pancreas can actually run out of insulin necessitating insulin shots.

## 2014-03-23 NOTE — Assessment & Plan Note (Signed)
See BMET; BON 30; creat WNL Despite rise in A1c ; urine microalbumin has improved Appropriate hydration discussed

## 2014-03-23 NOTE — Progress Notes (Signed)
Subjective:    Patient ID: [name], male    DOB: 26-Mar-1945, 69 y.o.   MRN: [id number]  HPI The patient is here to assess status of active health conditions.  PMH, FH, & Social History reviewed & updated.  He has been compliant with his medicines without adverse effects.  Diet is described as"cautious with carbs & sugar" but was on cruise 1 month ago Exercise program precluded by flare LBP. He'll explore Silver Sneakers Glucoses  @ home 88-143 Ophth exam 3/8; Podiatry 9/15 BP @ home 130/70-140/80  Recent labs were reviewed. His A1c has increased from 6.5% to 7.5%. This would correlate with an average sugar of 190 and a 50% increased cardiovascular risk. Despite this rise his albumin has dropped from 26.3 down to 8.3.  He's been diagnosed with renal insufficiency previously ;but his creatinine is normal and his BUN  mildly elevated at 30.  Lipids reveal triglycerides 165; LDL 86; HDL 43.8. TSH is therapeutic  He has mild hypercalcemia with a value of 10.8.    Review of Systems Nocturia 3-4X / night ROS negative for the following: Chest pain, palpitations      Dyspnea Edema Claudication Lightheadedness,Syncope Weight change Polyuria/phagia/dipsia     Blurred vision /diplopia/lossof vision Limb numbness/tingling/burning Stool or bladder incontinence Non healing skin lesions Abd pain, bowel changes  Myalgias Memory loss      Objective:   Physical Exam  Gen.: Adequately nourished in appearance. Alert, appropriate and cooperative throughout exam. BMI: 30.99 Appears younger than stated age  Head: Normocephalic without obvious abnormalities  Eyes: No corneal or conjunctival inflammation noted. Pupils equal round reactive to light and accommodation. Extraocular motion intact.  Ears: External  ear exam reveals no significant lesions or deformities. Canals clear .TMs normal. Hearing is grossly normal bilaterally. Nose: External nasal exam reveals no deformity or  inflammation. Nasal mucosa are pink and moist. No lesions or exudates noted.   Mouth: Oral mucosa and oropharynx reveal no lesions or exudates. Teeth in good repair. Neck: No deformities, masses, or tenderness noted. Range of motion & Thyroid normal. Lungs: Normal respiratory effort; chest expands symmetrically. Lungs are clear to auscultation without rales, wheezes, or increased work of breathing. Heart: Normal rate and rhythm. Normal S1 and S2. No gallop, click, or rub. No murmur. Abdomen: Bowel sounds normal; abdomen soft and nontender. No masses, organomegaly or hernias noted. Musculoskeletal/extremities: No deformity or scoliosis noted of  the thoracic or lumbar spine.  No clubbing, cyanosis, edema, or significant extremity  deformity noted.  Range of motion normal . Tone & strength normal. Hand joints normal Fingernail  health good. Crepitus of knees  Able to lie down & sit up w/o help.  Negative SLR bilaterally Vascular: Carotid, radial artery, dorsalis pedis and  posterior tibial pulses are full and equal. No bruits present. Neurologic: Alert and oriented x3. Deep tendon reflexes symmetrical and normal.  Gait normal   Heel & toe walking .     Skin: Intact without suspicious lesions or rashes. Lymph: No cervical, axillary lymphadenopathy present. Psych: Mood and affect are normal. Normally interactive  Assessment & Plan:  See Current Assessment & Plan in Problem List under specific Diagnosis

## 2014-03-23 NOTE — Telephone Encounter (Signed)
Patient's diabetic supply company has changed to [employer]. Patient gets strips and lancets through them. We either have not received these requests for refill, or they have been disposed of as spam. Please contact [facility] Medical [facility] @ [phone] to reorder supplies. Patient is running very low.

## 2014-03-23 NOTE — Assessment & Plan Note (Signed)
Blood pressure goals reviewed. BMET discussed

## 2014-03-23 NOTE — Telephone Encounter (Signed)
I called [employer] @ [phone] to verify what fax # correspondence is being sent to. [employer] had the wrong fax #. I gave the correct fax number to send correspondence to.

## 2014-03-23 NOTE — Progress Notes (Signed)
Pre visit review using our clinic review tool, if applicable. No additional management support is needed unless otherwise documented below in the visit note. 

## 2014-03-23 NOTE — Patient Instructions (Addendum)
Fasting labs in 4 months please The following nutritional changes may help prevent Diabetes progression & complications.  White carbohydrates (potatoes, rice, bread, and pasta) cause a high spike of the sugar level which stays elevated for a significant period of time (called sugar"load").  For example a  baked potato has a cup of sugar and a  french fry  2 teaspoons of sugar.  More complex carbs such as yams, wild  rice, whole grained bread &  wheat pasta have been much lower spike and persistent load of sugar than the white carbs. The pancreas excretes excess insulin in response to the high spike & load of sugar . Over time the pancreas can actually run out of insulin necessitating insulin shots. The most common cause of elevated triglycerides (TG) is the ingestion of sugar from high fructose corn syrup sources added to processed foods & drinks.  Eat a low-fat diet with lots of fruits and vegetables, up to 7-9 servings per day. Consume less than 40 Grams (preferably ZERO) of sugar per day from foods & drinks with High Fructose Corn Syrup (HFCS) sugar as #1,2,3 or # 4 on label.Whole Foods, [employer] [place] do not carry products with HFCS. Cardiovascular exercise, this can be as simple a program as walking, is recommended 30-45 minutes 3-4 times per week. If you're not exercising you should take 6-8 weeks to build up to this level. The best exercises for the low back include freestyle swimming, stretch aerobics, and yoga.Cybex & Nautilus machines rather than dead weights are better for the back.

## 2014-03-24 ENCOUNTER — Encounter: Payer: Self-pay | Admitting: Internal Medicine

## 2014-03-24 DIAGNOSIS — H4052X1 Glaucoma secondary to other eye disorders, left eye, mild stage: Secondary | ICD-10-CM | POA: Diagnosis not present

## 2014-03-25 ENCOUNTER — Telehealth: Payer: Self-pay | Admitting: Internal Medicine

## 2014-03-25 NOTE — Telephone Encounter (Signed)
emmi emailed °

## 2014-04-01 ENCOUNTER — Encounter: Payer: Self-pay | Admitting: Internal Medicine

## 2014-04-01 DIAGNOSIS — E039 Hypothyroidism, unspecified: Secondary | ICD-10-CM

## 2014-04-01 MED ORDER — LEVOTHYROXINE SODIUM 25 MCG PO TABS: ORAL_TABLET | ORAL | Status: DC

## 2014-04-25 ENCOUNTER — Encounter: Payer: Self-pay | Admitting: Internal Medicine

## 2014-04-27 ENCOUNTER — Ambulatory Visit (INDEPENDENT_AMBULATORY_CARE_PROVIDER_SITE_OTHER): Payer: Medicare Other | Admitting: Internal Medicine

## 2014-04-27 VITALS — BP 130/74 | HR 78 | Ht 69.0 in | Wt 201.0 lb

## 2014-04-27 DIAGNOSIS — I4891 Unspecified atrial fibrillation: Secondary | ICD-10-CM

## 2014-04-27 DIAGNOSIS — I10 Essential (primary) hypertension: Secondary | ICD-10-CM

## 2014-04-27 LAB — MDC_IDC_ENUM_SESS_TYPE_INCLINIC
Date Time Interrogation Session: 20160411105824
Implantable Pulse Generator Model: 9526
Zone Setting Detection Interval: 2000 ms
Zone Setting Detection Interval: 260 ms
Zone Setting Detection Interval: 3000 ms
Zone Setting Detection Interval: 340 ms

## 2014-04-27 MED ORDER — METOPROLOL SUCCINATE ER 100 MG PO TB24: ORAL_TABLET | ORAL | Status: DC

## 2014-04-27 NOTE — Patient Instructions (Signed)
Your physician wants you to follow-up in: 12 months with Dr Allred You will receive a reminder letter in the mail two months in advance. If you don't receive a letter, please call our office to schedule the follow-up appointment.  

## 2014-04-27 NOTE — Progress Notes (Signed)
Electrophysiology Office Note   Date:  04/27/2014   ID:  [name], DOB October 28, 1945, MRN [id number]  PCP:  Unice Cobble, MD   Primary Electrophysiologist: Thompson Grayer, MD    Chief Complaint  Patient presents with  . Atrial Fibrillation     History of Present Illness: [name] is a 69 y.o. male who presents today for electrophysiology evaluation.   He has been in sinus rhythm for almost a year.  He is pleased with his health state.  He is retired.  He stopped tikosyn 2 months ago.  Today, he denies symptoms of palpitations, chest pain, shortness of breath, orthopnea, PND, lower extremity edema, claudication, dizziness, presyncope, syncope, bleeding, or neurologic sequela. The patient is tolerating medications without difficulties and is otherwise without complaint today.    Past Medical History  Diagnosis Date  . Diabetes mellitus     type 2  . Hyperlipidemia   . Hypertension   . Allergic rhinitis     seasonal  . Hypothyroidism   . AF (atrial fibrillation)     persistent afib, s/p afib ablation x 2 with subsequent convergent procedure at [facility]  . CTS (carpal tunnel syndrome)     PMH of  . Renal calculus 2005    [facility]  . Atrial flutter    Past Surgical History  Procedure Laterality Date  . Cardioversion    . Afib 09/22/2008--11/03/2009      afib ablation x 2 by Dr Rayann Heman with subsequent convergent procedure at [facility]  . Total hip arthroplasty  06/2010    Depuy prosthesis replacement, Dr Alvan Dame  . Cardioversion  12/09/2010    Procedure: CARDIOVERSION;  Surgeon: Loralie Champagne, MD;  Location: [place];  Service: Cardiovascular;  Laterality: N/A;  . Total hip arthroplasty Right 2008     Depuy;Dr Alvan Dame  . Tonsillectomy    . Cardioversion  09/15/2011    Procedure: CARDIOVERSION;  Surgeon: Josue Hector, MD;  Location: [place];  Service: Cardiovascular;  Laterality: N/A;  office drawing labs wed 8/28  . Loop recorder implant Left 12-2010  .  Cardiac catheterization  2005    non obstructive CAD  . Convergent procedure  2013    at [facility] for a fib  . Cardioversion N/A 07/18/2012    Procedure: CARDIOVERSION;  Surgeon: Thayer Headings, MD;  Location: [place];  Service: Cardiovascular;  Laterality: N/A;  . Joint replacement Right 2008, 2012  . Ablation  06/2013    atypical atrial flutter ablation by Dr Lehman Prom at [facility] (mitral annular flutter ablated)  . Colonoscopy  2009     Current Outpatient Prescriptions  Medication Sig Dispense Refill  . AMBULATORY NON FORMULARY MEDICATION OCUVITE, Take 1 tablet by mouth daily    . amLODipine (NORVASC) 10 MG tablet TAKE 1 TABLET DAILY (Patient taking differently: TAKE 1 TABLET BY MOUTH DAILY) 90 tablet 0  . levothyroxine (SYNTHROID, LEVOTHROID) 25 MCG tablet TAKE ONE AND ONE-HALF TABLETS EVERY TUESDAY, THURSDAY AND SATURDAY AND 1 TABLET ON ALL OTHER DAYS OF THE WEEK. (Patient taking differently: TAKE ONE AND ONE-HALF TABLETS BY MOUTH EVERY TUESDAY, THURSDAY AND SATURDAY AND 1 TABLET BY MOUTH ON ALL OTHER DAYS OF THE WEEK.) 111 tablet 1  . Magnesium Oxide 400 MG CAPS Take 1 capsule (400 mg total) by mouth 2 (two) times daily. 180 capsule 3  . metFORMIN (GLUCOPHAGE-XR) 500 MG 24 hr tablet TAKE 2 TABLETS (1000MG ) TWICE A DAY WITH MEALS (Patient taking differently: TAKE 2 TABLETS (1000MG )  BY MOUTH TWICE A DAY WITH MEALS) 360 tablet 0  . metoprolol succinate (TOPROL-XL) 100 MG 24 hr tablet Take one tablet by mouth twice daily and an extra 1/2 tablet by mouth daily as needed for afib 225 tablet 3  . Multiple Vitamin (MULTIVITAMIN) capsule Take 1 capsule by mouth daily.      . nateglinide (STARLIX) 120 MG tablet Take 1 tablet (120 mg total) by mouth 3 (three) times daily with meals. 270 tablet 1  . quinapril (ACCUPRIL) 40 MG tablet TAKE 1 TABLET DAILY (Patient taking differently: TAKE 1 TABLET BY MOUTH DAILY) 90 tablet 1  . rivaroxaban (XARELTO) 20 MG TABS tablet Take 1 tablet (20 mg total) by mouth daily.  90 tablet 1   No current facility-administered medications for this visit.    Allergies:   Review of patient's allergies indicates no known allergies.   Social History:  The patient  reports that he quit smoking about 40 years ago. His smoking use included Cigarettes. He has a 7.2 pack-year smoking history. He does not have any smokeless tobacco history on file. He reports that he drinks alcohol. He reports that he does not use illicit drugs.   Family History:  The patient's  family history includes Allergies in his brother and maternal grandmother; Cancer in his maternal grandmother; Diabetes in his father; Lung cancer in his mother; Stroke in his father; Stroke (age of onset: 78) in his mother; Testicular cancer in his brother. There is no history of Heart attack.    ROS:  Please see the history of present illness.   All other systems are reviewed and negative.    PHYSICAL EXAM: VS:  BP 130/74 mmHg  Pulse 78  Ht 5\' 9"  (1.753 m)  Wt 201 lb (91.173 kg)  BMI 29.67 kg/m2 , BMI Body mass index is 29.67 kg/(m^2). GEN: Well nourished, well developed, in no acute distress HEENT: normal Neck: no JVD, carotid bruits, or masses Cardiac: RRR; no murmurs, rubs, or gallops,no edema  Respiratory:  clear to auscultation bilaterally, normal work of breathing GI: soft, nontender, nondistended, + BS MS: no deformity or atrophy Skin: warm and dry  Neuro:  Strength and sensation are intact Psych: euthymic mood, full affect  ILR is reviewed today  Recent Labs: 03/17/2014: ALT 31; BUN 30*; Creatinine 1.26; Potassium 5.7*; Sodium 137; TSH 4.38    Lipid Panel     Component Value Date/Time   CHOL 163 03/17/2014 1137   TRIG 165.0* 03/17/2014 1137   HDL 43.80 03/17/2014 1137   CHOLHDL 4 03/17/2014 1137   VLDL 33.0 03/17/2014 1137   LDLCALC 86 03/17/2014 1137     Wt Readings from Last 3 Encounters:  04/27/14 201 lb (91.173 kg)  03/23/14 203 lb 12 oz (92.42 kg)  08/08/13 199 lb (90.266 kg)        ASSESSMENT AND PLAN:  1.  Persistent atrial fibrillation/ atypical atrial flutter maintaining sinus rhythm off AAD chads2vasc score is at least 3 Continue xarelto  2. htn Stable No change required today   Current medicines are reviewed at length with the patient today.   The patient does not have concerns regarding his medicines.  The following changes were made today:  none   Follow-up: return to see me in 1 year   Signed, Thompson Grayer, MD  04/27/2014 10:17 AM     [facility] [address] [place] [address] [phone] (office) [phone] (fax)

## 2014-05-07 ENCOUNTER — Encounter: Payer: Self-pay | Admitting: Internal Medicine

## 2014-05-12 ENCOUNTER — Other Ambulatory Visit (INDEPENDENT_AMBULATORY_CARE_PROVIDER_SITE_OTHER): Payer: Medicare Other

## 2014-05-12 ENCOUNTER — Ambulatory Visit (INDEPENDENT_AMBULATORY_CARE_PROVIDER_SITE_OTHER)
Admission: RE | Admit: 2014-05-12 | Discharge: 2014-05-12 | Disposition: A | Discharge status: Home / Self Care | Payer: Medicare Other | Source: Ambulatory Visit | Attending: Internal Medicine | Admitting: Internal Medicine

## 2014-05-12 ENCOUNTER — Ambulatory Visit (INDEPENDENT_AMBULATORY_CARE_PROVIDER_SITE_OTHER): Payer: Medicare Other | Admitting: Internal Medicine

## 2014-05-12 ENCOUNTER — Encounter: Payer: Self-pay | Admitting: Internal Medicine

## 2014-05-12 VITALS — BP 144/78 | HR 77 | Temp 98.1°F | Resp 16 | Ht 69.0 in | Wt 199.2 lb

## 2014-05-12 DIAGNOSIS — E1151 Type 2 diabetes mellitus with diabetic peripheral angiopathy without gangrene: Secondary | ICD-10-CM

## 2014-05-12 DIAGNOSIS — M545 Low back pain, unspecified: Secondary | ICD-10-CM

## 2014-05-12 DIAGNOSIS — E785 Hyperlipidemia, unspecified: Secondary | ICD-10-CM | POA: Diagnosis not present

## 2014-05-12 DIAGNOSIS — R194 Change in bowel habit: Secondary | ICD-10-CM | POA: Diagnosis not present

## 2014-05-12 DIAGNOSIS — R198 Other specified symptoms and signs involving the digestive system and abdomen: Secondary | ICD-10-CM

## 2014-05-12 DIAGNOSIS — R351 Nocturia: Secondary | ICD-10-CM

## 2014-05-12 DIAGNOSIS — M47817 Spondylosis without myelopathy or radiculopathy, lumbosacral region: Secondary | ICD-10-CM | POA: Diagnosis not present

## 2014-05-12 DIAGNOSIS — M5137 Other intervertebral disc degeneration, lumbosacral region: Secondary | ICD-10-CM | POA: Diagnosis not present

## 2014-05-12 LAB — URINALYSIS, ROUTINE W REFLEX MICROSCOPIC
Bilirubin Urine: NEGATIVE
Hgb urine dipstick: NEGATIVE
Ketones, ur: NEGATIVE
Nitrite: NEGATIVE
Specific Gravity, Urine: 1.025 (ref 1.000–1.030)
Total Protein, Urine: NEGATIVE
Urine Glucose: NEGATIVE
Urobilinogen, UA: 0.2 (ref 0.0–1.0)
pH: 6 (ref 5.0–8.0)

## 2014-05-12 LAB — LIPID PANEL
Cholesterol: 147 mg/dL (ref 0–200)
HDL: 43.9 mg/dL (ref >39.00)
LDL Cholesterol: 68 mg/dL (ref 0–99)
NonHDL: 103.1
Total CHOL/HDL Ratio: 3
Triglycerides: 174 mg/dL — ABNORMAL HIGH (ref 0.0–149.0)
VLDL: 34.8 mg/dL (ref 0.0–40.0)

## 2014-05-12 LAB — HEMOGLOBIN A1C: Hgb A1c MFr Bld: 7.2 % — ABNORMAL HIGH (ref 4.6–6.5)

## 2014-05-12 LAB — CALCIUM: Calcium: 11 mg/dL — ABNORMAL HIGH (ref 8.4–10.5)

## 2014-05-12 MED ORDER — TRAMADOL HCL 50 MG PO TABS: 50.0000 mg | ORAL_TABLET | Freq: Three times a day (TID) | ORAL | Status: DC | PRN

## 2014-05-12 NOTE — Patient Instructions (Addendum)
The best exercises for the low back include freestyle swimming, stretch aerobics, and yoga.Cybex & Nautilus machines rather than dead weights are better for the back. Your next office appointment will be determined based upon review of your pending labs & xrays Those instructions will be transmitted to you by My Chart.Critical results will be called. Followup as needed for any active or acute issue. Please report any significant change in your symptoms.  Please take a probiotic , Florastor OR Align, every day if the bowels are loose. This will replace the normal bacteria which  are necessary for formation of normal stool and processing of food.

## 2014-05-12 NOTE — Progress Notes (Signed)
Pre visit review using our clinic review tool, if applicable. No additional management support is needed unless otherwise documented below in the visit note. 

## 2014-05-12 NOTE — Progress Notes (Signed)
   Subjective:    Patient ID: [name], male    DOB: [birth year], 69 y.o.   MRN: [id number]  HPI He's had an exacerbation of mid low back pain since January 16 without specific trigger or injury. He's actually had chronic low back issues related to standing or working. The symptoms have exacerbated recently when he joined a gym and began to use some equipment. It is worse with rotation of the thorax or waist flexion. It is described as dull-sharp with the change in position. He is not taking any medicine for this despite the fact that the pain has been somewhat constant since January. It is nonradiating and not associated with any constitutional or other neuromuscular symptoms.  He does have loose to watery stool intermittently. He describes nocturia 3-4 times per night. Fasting blood sugars range 115-140.   Review of Systems Fever, chills, sweats, or unexplained weight loss not present. No significant headaches. Mental status change or memory loss denied. Blurred vision , diplopia or vision loss absent. Vertigo, near syncope or imbalance denied. There is no numbness, tingling, or weakness in extremities.   No loss of control of bladder or bowels. Radicular type pain absent. No seizure stigmata.     Objective:   Physical Exam Pertinent or positive findings include: He has some back pain with elevation of the right leg.  There is a prominent panniculus present.  Genitourinary & prostate exam are normal. Neurologic exam : Cn 2-7 intact Strength and tone equal & normal in upper & lower extremities Able to walk on heels and toes.   Balance normal DTRs 0-1/2+ @ knees. Affect flat; oriented X 3  General appearance :adequately nourished; in no distress. Eyes: No conjunctival inflammation or scleral icterus is present. Oral exam:  Lips and gums are healthy appearing.There is no oropharyngeal erythema or exudate noted. Dental hygiene is good. Heart:  Normal rate and regular  rhythm. S1 and S2 normal without gallop, murmur, click, rub or other extra sounds  Lungs:Chest clear to auscultation; no wheezes, rhonchi,rales ,or rubs present.No increased work of breathing.  Abdomen: bowel sounds normal, soft and non-tender without masses, organomegaly or hernias noted.  No guarding or rebound. No flank tenderness to percussion. Vascular : all pulses equal ; no bruits present. Skin:Warm & dry.  Intact without suspicious lesions or rashes ; no tenting or jaundice  Lymphatic: No lymphadenopathy is noted about the head, neck, axilla, or inguinal areas.       Assessment & Plan:  #1 acute on chronic low back pain #2 nocturia #3 bowel changes Plan: Pain medication short term Imaging PT if no better   Low back exercises discussed  Imaging ; physical therapy if no better

## 2014-05-13 ENCOUNTER — Telehealth: Payer: Self-pay

## 2014-05-13 ENCOUNTER — Other Ambulatory Visit: Payer: Medicare Other

## 2014-05-13 DIAGNOSIS — R8290 Unspecified abnormal findings in urine: Secondary | ICD-10-CM

## 2014-05-13 DIAGNOSIS — R8299 Other abnormal findings in urine: Secondary | ICD-10-CM | POA: Diagnosis not present

## 2014-05-13 NOTE — Telephone Encounter (Signed)
-----   Message from Hendricks Limes, MD sent at 05/13/2014  6:12 AM EDT ----- Can Lab add intact PTH (parathyroid hormone) to order from yesterday? If not he needs to come in fasing for that

## 2014-05-13 NOTE — Telephone Encounter (Signed)
-----   Message from Hendricks Limes, MD sent at 05/13/2014  6:19 AM EDT ----- Ask Lab to send urine for culture & sensitivity Thanks, Linus Orn

## 2014-05-13 NOTE — Telephone Encounter (Signed)
Request has been faxed to lab

## 2014-05-13 NOTE — Telephone Encounter (Signed)
Request has been faxed to lab and urine culture order placed

## 2014-05-13 NOTE — Telephone Encounter (Signed)
Phone call from Santiago Glad in the lab. PTH can not be added. The proper tubes were not drawn that day.   Phone call to patient. He has been advised to come back to lab to have PTH level drawn and he does need to be fasting. He was also advised per Dr Linna Darner to stop any calcium or vitamin d

## 2014-05-14 ENCOUNTER — Other Ambulatory Visit: Payer: Medicare Other

## 2014-05-15 LAB — PARATHYROID HORMONE, INTACT (NO CA): PTH: 27 pg/mL (ref 14–64)

## 2014-05-15 LAB — URINE CULTURE: Colony Count: 5000

## 2014-05-17 ENCOUNTER — Encounter: Payer: Self-pay | Admitting: Internal Medicine

## 2014-05-17 DIAGNOSIS — E1151 Type 2 diabetes mellitus with diabetic peripheral angiopathy without gangrene: Secondary | ICD-10-CM

## 2014-05-17 DIAGNOSIS — I10 Essential (primary) hypertension: Secondary | ICD-10-CM

## 2014-05-18 MED ORDER — METFORMIN HCL ER 500 MG PO TB24: ORAL_TABLET | ORAL | Status: DC

## 2014-05-18 MED ORDER — QUINAPRIL HCL 40 MG PO TABS: ORAL_TABLET | ORAL | Status: DC

## 2014-05-18 MED ORDER — AMLODIPINE BESYLATE 10 MG PO TABS: ORAL_TABLET | ORAL | Status: DC

## 2014-05-22 DIAGNOSIS — M545 Low back pain: Secondary | ICD-10-CM | POA: Diagnosis not present

## 2014-06-18 ENCOUNTER — Ambulatory Visit: Payer: Medicare Other | Attending: Physical Medicine and Rehabilitation | Admitting: Rehabilitation

## 2014-06-18 DIAGNOSIS — M545 Low back pain, unspecified: Secondary | ICD-10-CM

## 2014-06-18 DIAGNOSIS — M2560 Stiffness of unspecified joint, not elsewhere classified: Secondary | ICD-10-CM | POA: Insufficient documentation

## 2014-06-18 DIAGNOSIS — M5386 Other specified dorsopathies, lumbar region: Secondary | ICD-10-CM

## 2014-06-18 NOTE — Therapy (Signed)
[place] [address] [place], [place], [address] Phone: [phone]   Fax:  [phone]  Physical Therapy Evaluation  Patient Details  Name: [name] MRN: [id number] Date of Birth: 1945-06-07 Referring Provider:  Suella Broad, MD  Encounter Date: 06/18/2014      PT End of Session - 06/18/14 1205    Visit Number 1   PT Start Time 7591   PT Stop Time 1115   PT Time Calculation (min) 60 min      Past Medical History  Diagnosis Date  . Diabetes mellitus     type 2  . Hyperlipidemia   . Hypertension   . Allergic rhinitis     seasonal  . Hypothyroidism   . AF (atrial fibrillation)     persistent afib, s/p afib ablation x 2 with subsequent convergent procedure at [facility]  . CTS (carpal tunnel syndrome)     PMH of  . Renal calculus 2005    [facility]  . Atrial flutter     Past Surgical History  Procedure Laterality Date  . Cardioversion    . Afib 09/22/2008--11/03/2009      afib ablation x 2 by Dr Rayann Heman with subsequent convergent procedure at [facility]  . Total hip arthroplasty  06/2010    Depuy prosthesis replacement, Dr Alvan Dame  . Cardioversion  12/09/2010    Procedure: CARDIOVERSION;  Surgeon: Loralie Champagne, MD;  Location: [place];  Service: Cardiovascular;  Laterality: N/A;  . Total hip arthroplasty Right 2008     Depuy;Dr Alvan Dame  . Tonsillectomy    . Cardioversion  09/15/2011    Procedure: CARDIOVERSION;  Surgeon: Josue Hector, MD;  Location: [place];  Service: Cardiovascular;  Laterality: N/A;  office drawing labs wed 8/28  . Loop recorder implant Left 12-2010  . Cardiac catheterization  2005    non obstructive CAD  . Convergent procedure  2013    at [facility] for a fib  . Cardioversion N/A 07/18/2012    Procedure: CARDIOVERSION;  Surgeon: Thayer Headings, MD;  Location: [facility];  Service: Cardiovascular;  Laterality: N/A;  . Joint replacement Right 2008, 2012  . Ablation  06/2013   atypical atrial flutter ablation by Dr Lehman Prom at [facility] (mitral annular flutter ablated)  . Colonoscopy  2009    There were no vitals filed for this visit.  Visit Diagnosis:  Midline low back pain without sciatica - Plan: PT plan of care cert/re-cert  Decreased range of motion of lumbar spine - Plan: PT plan of care cert/re-cert      Subjective Assessment - 06/18/14 1015    Subjective Pt. reports long h/o LBP that has worsened over the last year.    Xrays at [facility] ortho showed L4-S1 DDD, facet arthropathy.       Limitations Walking;Standing   How long can you sit comfortably? unlimited   How long can you stand comfortably? 1hr   Patient Stated Goals be able to to tolerate upright standing and prlonged gait for outdoor work at home, travel, etc.   Currently in Pain? Yes   Pain Score 6    Pain Location Back   Pain Orientation Medial   Pain Descriptors / Indicators Aching;Burning   Pain Type Chronic pain   Pain Onset More than a month ago   Pain Frequency Constant   Aggravating Factors  upright, WB activity   Pain Relieving Factors sitting, lying   Effect of Pain on Daily Activities  unable to stand long periods, do yardwork.    stationary standing esp difficulty for over 3-5 min.            [facility] PT Assessment - 06/18/14 0001    Assessment   Medical Diagnosis DDD, facet arthropathy   Next MD Visit 07/30/14   Prior Therapy no   Precautions   Precautions None   Balance Screen   Has the patient fallen in the past 6 months No   Has the patient had a decrease in activity level because of a fear of falling?  Yes   Is the patient reluctant to leave their home because of a fear of falling?  No   Home Environment   Living Environment Private residence   ROM / Strength   AROM / PROM / Strength AROM;Strength   AROM   AROM Assessment Site Lumbar   Lumbar Flexion 50%   Lumbar Extension 50%   Lumbar - Right Side Bend 50%   Lumbar - Left Side Bend 50%   Lumbar - Right Rotation 40%    Lumbar - Left Rotation 50%   Strength   Strength Assessment Site Hip;Knee;Ankle   Right/Left Hip Right;Left   Right Hip Flexion 5/5   Left Hip Flexion 5/5   Palpation   Palpation comment nontender to palpate   Special Tests    Special Tests Lumbar   Lumbar Tests FABER test;Slump Test;Straight Leg Raise   FABER test   findings Negative   Slump test   Findings Negative   Straight Leg Raise   Findings Negative   Comment 50 deg LLE, 75 deg RLE                   [facility] Adult PT Treatment/Exercise - 06/18/14 0001    Modalities   Modalities Electrical Stimulation;Moist Heat;Traction   Moist Heat Therapy   Number Minutes Moist Heat 15 Minutes   Moist Heat Location Lumbar Spine   Electrical Stimulation   Electrical Stimulation Location lumbar spine   Electrical Stimulation Parameters IFC at 35-40 mA   Electrical Stimulation Goals Pain   Traction   Type of Traction Lumbar   Max (lbs) 80   Hold Time static  static   Time 15'                PT Education - 06/18/14 1053    Education provided Yes   Education Details HEP:    supine LS rotation x 15-20 rep, cross over piriformis stretch x 1' x 3 BLE (careful with RLE due to THA revision several yrs ago)          PT Short Term Goals - 06/18/14 1209    PT SHORT TERM GOAL #1   Title Pt. will have 75-100% lumbar ROM   Time 4   Period Weeks   PT SHORT TERM GOAL #2   Title Pt. will have improved LLE SLR to 75-90 degrees   Time 4   Period Weeks   Status New   PT SHORT TERM GOAL #3   Title Pt. will be independent with HEP   Time 4   Period Weeks   Status New           PT Long Term Goals - 06/18/14 1210    PT LONG TERM GOAL #1   Title Pt. will report pain level of 3/10 average or below with subjective 50-75% improvement in symptoms   Time 8   Period Weeks   Status New  PT LONG TERM GOAL #2   Title Pt. will report able to standing and ambulate 2 hrs without symptoms interfering   Time 8   Period  Weeks   Status New               Plan - 06/18/14 1206    Clinical Impression Statement Pt. presents with symptoms consistent with DDD/OA.   Has no radicular symptoms.     He has quite limited ROM of the low back and hips which likely impact his pain.        Pt will benefit from skilled therapeutic intervention in order to improve on the following deficits Pain;Impaired flexibility;Postural dysfunction   Rehab Potential Good   PT Frequency 2x / week   PT Duration 8 weeks   PT Treatment/Interventions Patient/family education;Therapeutic activities;Moist Heat;Traction;Ultrasound;Therapeutic exercise;Electrical Stimulation;Cryotherapy;Manual techniques   PT Next Visit Plan Assess how traction/modalities affected pain next day or 2 afterwards.    Add gym ex, possibly SKTC stretching LLE (RLE has THA revision), manual stretching         Problem List Patient Active Problem List   Diagnosis Date Noted  . Hypercalcemia 03/23/2014  . Hypothyroidism 12/30/2010  . OSA (obstructive sleep apnea) 09/02/2010  . Atrial fibrillation 04/05/2009  . EDEMA 04/05/2009  . Prerenal azotemia 09/29/2008  . ERECTILE DYSFUNCTION 05/01/2008  . Diabetes mellitus with peripheral circulatory disorder, controlled 09/06/2007  . Hyperlipidemia 09/06/2007  . Essential hypertension 09/06/2007  . HYPERPLASIA PROSTATE UNS W/UR OBST & OTH LUTS 04/25/2007    Derrick Tiegs,PT          06/18/2014, 12:17 PM  [address] [place], [place], [address] Phone: [phone]   Fax:  [phone]

## 2014-06-24 ENCOUNTER — Ambulatory Visit: Payer: Medicare Other | Admitting: Rehabilitation

## 2014-06-30 ENCOUNTER — Ambulatory Visit: Payer: Medicare Other | Admitting: Rehabilitation

## 2014-06-30 DIAGNOSIS — M545 Low back pain, unspecified: Secondary | ICD-10-CM

## 2014-06-30 DIAGNOSIS — M2560 Stiffness of unspecified joint, not elsewhere classified: Secondary | ICD-10-CM | POA: Diagnosis not present

## 2014-06-30 NOTE — Therapy (Signed)
[place], [place], [address] Phone: [phone]   Fax:  [phone]  Physical Therapy Treatment  Patient Details  Name: [name] MRN: [id number] Date of Birth: 1945/03/28 Referring Provider:  Suella Broad, MD  Encounter Date: 06/30/2014      PT End of Session - 06/30/14 1042    Visit Number 2   PT Start Time 0930   PT Stop Time 1030   PT Time Calculation (min) 60 min      Past Medical History  Diagnosis Date  . Diabetes mellitus     type 2  . Hyperlipidemia   . Hypertension   . Allergic rhinitis     seasonal  . Hypothyroidism   . AF (atrial fibrillation)     persistent afib, s/p afib ablation x 2 with subsequent convergent procedure at [facility]  . CTS (carpal tunnel syndrome)     PMH of  . Renal calculus 2005    [facility]  . Atrial flutter     Past Surgical History  Procedure Laterality Date  . Cardioversion    . Afib 09/22/2008--11/03/2009      afib ablation x 2 by Dr Rayann Heman with subsequent convergent procedure at [facility]  . Total hip arthroplasty  06/2010    Depuy prosthesis replacement, Dr Alvan Dame  . Cardioversion  12/09/2010    Procedure: CARDIOVERSION;  Surgeon: Loralie Champagne, MD;  Location: [place];  Service: Cardiovascular;  Laterality: N/A;  . Total hip arthroplasty Right 2008     Depuy;Dr Alvan Dame  . Tonsillectomy    . Cardioversion  09/15/2011    Procedure: CARDIOVERSION;  Surgeon: Josue Hector, MD;  Location: [place];  Service: Cardiovascular;  Laterality: N/A;  office drawing labs wed 8/28  . Loop recorder implant Left 12-2010  . Cardiac catheterization  2005    non obstructive CAD  . Convergent procedure  2013    at [facility] for a fib  . Cardioversion N/A 07/18/2012    Procedure: CARDIOVERSION;  Surgeon: Thayer Headings, MD;  Location: [place];  Service: Cardiovascular;  Laterality: N/A;  . Joint replacement Right 2008, 2012  . Ablation  06/2013   atypical atrial flutter ablation by Dr Lehman Prom at [facility] (mitral annular flutter ablated)  . Colonoscopy  2009    There were no vitals filed for this visit.  Visit Diagnosis:  Midline low back pain without sciatica                       [facility] Adult PT Treatment/Exercise - 06/30/14 0001    Exercises   Exercises Lumbar   Lumbar Exercises: Stretches   Active Hamstring Stretch 3 reps;60 seconds   Active Hamstring Stretch Limitations manual   Piriformis Stretch 3 reps;60 seconds   Piriformis Stretch Limitations manual overpressure/assist   Lumbar Exercises: Aerobic   Elliptical NuStep x 5' L5   Lumbar Exercises: Supine   Dead Bug 20 reps   Bridge 20 reps   Bridge Limitations on ball single leg bridges   Other Supine Lumbar Exercises ball S/S, SKTC, unilateral bridge x 20 ea   Other Supine Lumbar Exercises dead bug marching x 20   Moist Heat Therapy   Number Minutes Moist Heat 15 Minutes   Moist Heat Location Lumbar Spine   Electrical Stimulation   Electrical Stimulation Location lumbar spine   Electrical Stimulation Parameters IFC to tolerance   Electrical Stimulation Goals Pain  Traction   Type of Traction Lumbar   Max (lbs) 100   Hold Time static   Time 12"                PT Education - 06/30/14 1041    Education provided Yes   Education Details supine:  posterior pelvic tilt with marching   Person(s) Educated Patient   Methods Explanation;Demonstration   Comprehension Returned demonstration;Tactile cues required;Verbal cues required;Need further instruction          PT Short Term Goals - 06/18/14 1209    PT SHORT TERM GOAL #1   Title Pt. will have 75-100% lumbar ROM   Time 4   Period Weeks   PT SHORT TERM GOAL #2   Title Pt. will have improved LLE SLR to 75-90 degrees   Time 4   Period Weeks   Status New   PT SHORT TERM GOAL #3   Title Pt. will be independent with HEP   Time 4   Period Weeks   Status New           PT Long  Term Goals - 06/18/14 1210    PT LONG TERM GOAL #1   Title Pt. will report pain level of 3/10 average or below with subjective 50-75% improvement in symptoms   Time 8   Period Weeks   Status New   PT LONG TERM GOAL #2   Title Pt. will report able to standing and ambulate 2 hrs without symptoms interfering   Time 8   Period Weeks   Status New               Plan - 06/30/14 1042    Clinical Impression Statement Pt. reports feels a little better than last week, but initially was very sore after last treatment.     he is doing his HEP so this is advanced today.     PT Next Visit Plan review and advance posterior pelvic stabilization ex;   add SKTC for LLE only (has THA revision on R),   continue hamstring stretching        Problem List Patient Active Problem List   Diagnosis Date Noted  . Hypercalcemia 03/23/2014  . Hypothyroidism 12/30/2010  . OSA (obstructive sleep apnea) 09/02/2010  . Atrial fibrillation 04/05/2009  . EDEMA 04/05/2009  . Prerenal azotemia 09/29/2008  . ERECTILE DYSFUNCTION 05/01/2008  . Diabetes mellitus with peripheral circulatory disorder, controlled 09/06/2007  . Hyperlipidemia 09/06/2007  . Essential hypertension 09/06/2007  . HYPERPLASIA PROSTATE UNS W/UR OBST & OTH LUTS 04/25/2007    Desirai Traxler, PT 06/30/2014, 10:46 AM  [address] [place], [place], [address] Phone: [phone]   Fax:  [phone]

## 2014-07-01 ENCOUNTER — Encounter: Payer: Self-pay | Admitting: Internal Medicine

## 2014-07-03 ENCOUNTER — Ambulatory Visit: Payer: Medicare Other | Admitting: Physical Therapy

## 2014-07-03 DIAGNOSIS — M545 Low back pain, unspecified: Secondary | ICD-10-CM

## 2014-07-03 DIAGNOSIS — M2560 Stiffness of unspecified joint, not elsewhere classified: Secondary | ICD-10-CM | POA: Diagnosis not present

## 2014-07-03 DIAGNOSIS — M5386 Other specified dorsopathies, lumbar region: Secondary | ICD-10-CM

## 2014-07-03 NOTE — Therapy (Signed)
[place], [place], [address] Phone: [phone]   Fax:  [phone]  Physical Therapy Treatment  Patient Details  Name: [name] MRN: [id number] Date of Birth: 23-Feb-1945 Referring Provider:  Suella Broad, MD  Encounter Date: 07/03/2014      PT End of Session - 07/03/14 1050    Visit Number 3   PT Start Time 0930   PT Stop Time 9528   PT Time Calculation (min) 60 min   Activity Tolerance Patient tolerated treatment well;No increased pain   Behavior During Therapy WFL for tasks assessed/performed      Past Medical History  Diagnosis Date   Diabetes mellitus     type 2   Hyperlipidemia    Hypertension    Allergic rhinitis     seasonal   Hypothyroidism    AF (atrial fibrillation)     persistent afib, s/p afib ablation x 2 with subsequent convergent procedure at [facility]   CTS (carpal tunnel syndrome)     PMH of   Renal calculus 2005    [facility]   Atrial flutter     Past Surgical History  Procedure Laterality Date   Cardioversion     Afib 09/22/2008--11/03/2009      afib ablation x 2 by Dr Rayann Heman with subsequent [facility]   Total hip arthroplasty  06/2010    Depuy prosthesis replacement, Dr Alvan Dame   Cardioversion  12/09/2010    Procedure: CARDIOVERSION;  Surgeon: Loralie Champagne, MD;  Location: [place];  Service: Cardiovascular;  Laterality: N/A;   Total hip arthroplasty Right 2008     Depuy;Dr Alvan Dame   Tonsillectomy     Cardioversion  09/15/2011    Procedure: CARDIOVERSION;  Surgeon: Josue Hector, MD;  Location: [place];  Service: Cardiovascular;  Laterality: N/A;  office drawing labs wed 8/28   Loop recorder implant Left 12-2010   Cardiac catheterization  2005    non obstructive CAD   Convergent procedure  2013    at [facility] for a fib   Cardioversion N/A 07/18/2012    Procedure: CARDIOVERSION;  Surgeon: Thayer Headings, MD;   Location: [facility] END[facility];  Service: Cardiovascular;  Laterality: N/A;   Joint replacement Right 2008, 2012   Ablation  06/2013    atypical atrial flutter ablation by Dr Lehman Prom at [facility] (mitral annular flutter ablated)   Colonoscopy  2009    There were no vitals filed for this visit.  Visit Diagnosis:  Midline low back pain without sciatica  Decreased range of motion of lumbar spine      Subjective Assessment - 07/03/14 1035    Subjective Reports chronic LBP, constant in nature with varying intensity.     Limitations Walking;Standing                         [facility] Adult PT Treatment/Exercise - 07/03/14 0001    Lumbar Exercises: Stretches   Active Hamstring Stretch 3 reps;30 seconds   Active Hamstring Stretch Limitations manual   Piriformis Stretch 3 reps;30 seconds   Piriformis Stretch Limitations manual overpressure/assist   Lumbar Exercises: Aerobic   Elliptical NuStep x 5' L5   Lumbar Exercises: Supine   Dead Bug 20 reps   Bridge 20 reps   Other Supine Lumbar Exercises ball S/S, SKTC, unilateral bridge x 20 ea   Other Supine Lumbar Exercises dead bug marching x 20   Traction  Type of Traction Lumbar   Max (lbs) 100   Hold Time static   Time 57'                  PT Short Term Goals - 06/18/14 1209    PT SHORT TERM GOAL #1   Title Pt. will have 75-100% lumbar ROM   Time 4   Period Weeks   PT SHORT TERM GOAL #2   Title Pt. will have improved LLE SLR to 75-90 degrees   Time 4   Period Weeks   Status New   PT SHORT TERM GOAL #3   Title Pt. will be independent with HEP   Time 4   Period Weeks   Status New           PT Long Term Goals - 06/18/14 1210    PT LONG TERM GOAL #1   Title Pt. will report pain level of 3/10 average or below with subjective 50-75% improvement in symptoms   Time 8   Period Weeks   Status New   PT LONG TERM GOAL #2   Title Pt. will report able to standing and ambulate 2 hrs without symptoms interfering    Time 8   Period Weeks   Status New               Plan - 07/03/14 1050    Clinical Impression Statement Cont with bilateral LE stiffness. Challenged appropriately with lumbopelvic stabillization exercises.     Pt will benefit from skilled therapeutic intervention in order to improve on the following deficits Pain;Impaired flexibility;Postural dysfunction        Problem List Patient Active Problem List   Diagnosis Date Noted   Hypercalcemia 03/23/2014   Hypothyroidism 12/30/2010   OSA (obstructive sleep apnea) 09/02/2010   Atrial fibrillation 04/05/2009   EDEMA 04/05/2009   Prerenal azotemia 09/29/2008   ERECTILE DYSFUNCTION 05/01/2008   Diabetes mellitus with peripheral circulatory disorder, controlled 09/06/2007   Hyperlipidemia 09/06/2007   Essential hypertension 09/06/2007   HYPERPLASIA PROSTATE UNS W/UR OBST & OTH LUTS 04/25/2007    Olean Ree, PTA 07/03/2014, 11:12 AM  [address] [place], [place], [address] Phone: [phone]   Fax:  [phone]

## 2014-07-07 ENCOUNTER — Ambulatory Visit: Payer: Medicare Other | Admitting: Physical Therapy

## 2014-07-07 ENCOUNTER — Encounter: Payer: Self-pay | Admitting: Physical Therapy

## 2014-07-07 DIAGNOSIS — M545 Low back pain, unspecified: Secondary | ICD-10-CM

## 2014-07-07 DIAGNOSIS — M5386 Other specified dorsopathies, lumbar region: Secondary | ICD-10-CM

## 2014-07-07 DIAGNOSIS — M2560 Stiffness of unspecified joint, not elsewhere classified: Secondary | ICD-10-CM | POA: Diagnosis not present

## 2014-07-07 NOTE — Therapy (Signed)
[place], [place], [address] Phone: [phone]   Fax:  [phone]  Physical Therapy Treatment  Patient Details  Name: [name] MRN: [id number] Date of Birth: 08-26-1945 Referring Provider:  Suella Broad, MD  Encounter Date: 07/07/2014      PT End of Session - 07/07/14 1625    Visit Number 4   PT Start Time 0272   PT Stop Time 5366   PT Time Calculation (min) 69 min      Past Medical History  Diagnosis Date  . Diabetes mellitus     type 2  . Hyperlipidemia   . Hypertension   . Allergic rhinitis     seasonal  . Hypothyroidism   . AF (atrial fibrillation)     persistent afib, s/p afib ablation x 2 with subsequent convergent procedure at [facility]  . CTS (carpal tunnel syndrome)     PMH of  . Renal calculus 2005    [facility]  . Atrial flutter     Past Surgical History  Procedure Laterality Date  . Cardioversion    . Afib 09/22/2008--11/03/2009      afib ablation x 2 by Dr Rayann Heman with subsequent convergent procedure at [facility]  . Total hip arthroplasty  06/2010    Depuy prosthesis replacement, Dr Alvan Dame  . Cardioversion  12/09/2010    Procedure: CARDIOVERSION;  Surgeon: Loralie Champagne, MD;  Location: [place];  Service: Cardiovascular;  Laterality: N/A;  . Total hip arthroplasty Right 2008     Depuy;Dr Alvan Dame  . Tonsillectomy    . Cardioversion  09/15/2011    Procedure: CARDIOVERSION;  Surgeon: Josue Hector, MD;  Location: [place];  Service: Cardiovascular;  Laterality: N/A;  office drawing labs wed 8/28  . Loop recorder implant Left 12-2010  . Cardiac catheterization  2005    non obstructive CAD  . Convergent procedure  2013    at [facility] for a fib  . Cardioversion N/A 07/18/2012    Procedure: CARDIOVERSION;  Surgeon: Thayer Headings, MD;  Location: [place];  Service: Cardiovascular;  Laterality: N/A;  . Joint replacement Right 2008, 2012  . Ablation  06/2013   atypical atrial flutter ablation by Dr Lehman Prom at [facility] (mitral annular flutter ablated)  . Colonoscopy  2009    There were no vitals filed for this visit.  Visit Diagnosis:  Midline low back pain without sciatica  Decreased range of motion of lumbar spine      Subjective Assessment - 07/07/14 1536    Subjective Mild low back pain mostly right side.  No much change, just a different type of pain   Currently in Pain? Yes   Pain Score 4    Pain Location Back   Pain Orientation Right   Pain Descriptors / Indicators Dull   Aggravating Factors  activity, yard work   Pain Relieving Factors rest                         [facility] Adult PT Treatment/Exercise - 07/07/14 0001    Lumbar Exercises: Stretches   Passive Hamstring Stretch 30 seconds;3 reps   Single Knee to Chest Stretch 20 seconds;3 reps   Lower Trunk Rotation 5 reps;10 seconds   Hip Flexor Stretch 30 seconds;4 reps   Quad Stretch 30 seconds;4 reps   ITB Stretch 30 seconds;4 reps   Piriformis Stretch 3 reps;30 seconds   Lumbar Exercises: Aerobic  Elliptical NuStep x 6' L5   Lumbar Exercises: Machines for Strengthening   Other Lumbar Machine Exercise seated row 25#, lats 25#, obliques 10#, hip abduction and extension 5#   Lumbar Exercises: Supine   Bridge 20 reps   Bridge Limitations on ball   Moist Heat Therapy   Number Minutes Moist Heat 15 Minutes   Moist Heat Location Lumbar Spine   Electrical Stimulation   Electrical Stimulation Location lumbar spine   Electrical Stimulation Parameters IFC   Electrical Stimulation Goals Pain                  PT Short Term Goals - 06/18/14 1209    PT SHORT TERM GOAL #1   Title Pt. will have 75-100% lumbar ROM   Time 4   Period Weeks   PT SHORT TERM GOAL #2   Title Pt. will have improved LLE SLR to 75-90 degrees   Time 4   Period Weeks   Status New   PT SHORT TERM GOAL #3   Title Pt. will be independent with HEP   Time 4   Period Weeks   Status  New           PT Long Term Goals - 06/18/14 1210    PT LONG TERM GOAL #1   Title Pt. will report pain level of 3/10 average or below with subjective 50-75% improvement in symptoms   Time 8   Period Weeks   Status New   PT LONG TERM GOAL #2   Title Pt. will report able to standing and ambulate 2 hrs without symptoms interfering   Time 8   Period Weeks   Status New               Plan - 07/07/14 1630    Clinical Impression Statement Added hip flexor and quad stretches today and some gym exercises   PT Next Visit Plan see if new exercises helped   Consulted and Agree with Plan of Care Patient        Problem List Patient Active Problem List   Diagnosis Date Noted  . Hypercalcemia 03/23/2014  . Hypothyroidism 12/30/2010  . OSA (obstructive sleep apnea) 09/02/2010  . Atrial fibrillation 04/05/2009  . EDEMA 04/05/2009  . Prerenal azotemia 09/29/2008  . ERECTILE DYSFUNCTION 05/01/2008  . Diabetes mellitus with peripheral circulatory disorder, controlled 09/06/2007  . Hyperlipidemia 09/06/2007  . Essential hypertension 09/06/2007  . HYPERPLASIA PROSTATE UNS W/UR OBST & OTH LUTS 04/25/2007    Sumner Boast., PT 07/07/2014, 4:31 PM  [address] [place], [place], [address] Phone: [phone]   Fax:  [phone]

## 2014-07-09 ENCOUNTER — Ambulatory Visit: Payer: Medicare Other | Admitting: Physical Therapy

## 2014-07-09 DIAGNOSIS — M5386 Other specified dorsopathies, lumbar region: Secondary | ICD-10-CM

## 2014-07-09 DIAGNOSIS — M545 Low back pain, unspecified: Secondary | ICD-10-CM

## 2014-07-09 DIAGNOSIS — M2560 Stiffness of unspecified joint, not elsewhere classified: Secondary | ICD-10-CM | POA: Diagnosis not present

## 2014-07-09 NOTE — Therapy (Signed)
[place], [place], [address] Phone: [phone]   Fax:  [phone]  Physical Therapy Treatment  Patient Details  Name: [name] MRN: [id number] Date of Birth: 27-Oct-1945 Referring Provider:  Suella Broad, MD  Encounter Date: 07/09/2014      PT End of Session - 07/09/14 1536    Visit Number 5   PT Start Time 6962   PT Stop Time 9528   PT Time Calculation (min) 65 min   Activity Tolerance Patient tolerated treatment well;No increased pain   Behavior During Therapy [facility] for tasks assessed/performed      Past Medical History  Diagnosis Date  . Diabetes mellitus     type 2  . Hyperlipidemia   . Hypertension   . Allergic rhinitis     seasonal  . Hypothyroidism   . AF (atrial fibrillation)     persistent afib, s/p afib ablation x 2 with subsequent convergent procedure at [facility]  . CTS (carpal tunnel syndrome)     PMH of  . Renal calculus 2005    [facility]  . Atrial flutter     Past Surgical History  Procedure Laterality Date  . Cardioversion    . Afib 09/22/2008--11/03/2009      afib ablation x 2 by Dr Rayann Heman with subsequent convergent procedure at [facility]  . Total hip arthroplasty  06/2010    Depuy prosthesis replacement, Dr Alvan Dame  . Cardioversion  12/09/2010    Procedure: CARDIOVERSION;  Surgeon: Loralie Champagne, MD;  Location: [place];  Service: Cardiovascular;  Laterality: N/A;  . Total hip arthroplasty Right 2008     Depuy;Dr Alvan Dame  . Tonsillectomy    . Cardioversion  09/15/2011    Procedure: CARDIOVERSION;  Surgeon: Josue Hector, MD;  Location: [place];  Service: Cardiovascular;  Laterality: N/A;  office drawing labs wed 8/28  . Loop recorder implant Left 12-2010  . Cardiac catheterization  2005    non obstructive CAD  . Convergent procedure  2013    at [facility] for a fib  . Cardioversion N/A 07/18/2012    Procedure: CARDIOVERSION;  Surgeon: Thayer Headings, MD;   Location: [place];  Service: Cardiovascular;  Laterality: N/A;  . Joint replacement Right 2008, 2012  . Ablation  06/2013    atypical atrial flutter ablation by Dr Lehman Prom at [facility] (mitral annular flutter ablated)  . Colonoscopy  2009    There were no vitals filed for this visit.  Visit Diagnosis:  Midline low back pain without sciatica  Decreased range of motion of lumbar spine      Subjective Assessment - 07/09/14 1533    Subjective feeling some better.   Limitations Walking;Standing   How long can you sit comfortably? unlimited   Patient Stated Goals be able to to tolerate upright standing and prlonged gait for outdoor work at home, travel, etc.   Currently in Pain? Yes   Pain Score 3    Pain Location Back   Pain Orientation Right   Pain Descriptors / Indicators Aching   Pain Onset More than a month ago   Multiple Pain Sites No                         [facility] Adult PT Treatment/Exercise - 07/09/14 0001    Lumbar Exercises: Stretches   Passive Hamstring Stretch 30 seconds   Single Knee to Chest Stretch 20 seconds  Lower Trunk Rotation 5 reps   Hip Flexor Stretch 30 seconds   Lumbar Exercises: Aerobic   Elliptical Nustep L5X6   Lumbar Exercises: Supine   Other Supine Lumbar Exercises SKTC/DKTC with arms   Moist Heat Therapy   Number Minutes Moist Heat 15 Minutes   Moist Heat Location Lumbar Spine   Electrical Stimulation   Electrical Stimulation Location lumbar   Electrical Stimulation Parameters IFC   Electrical Stimulation Goals Pain   Manual Therapy   Manual Therapy Muscle Energy Technique;Myofascial release   Manual therapy comments Left pelvis rotation fwd, bent knee pressure from PTA   Myofascial Release R QL                  PT Short Term Goals - 06/18/14 1209    PT SHORT TERM GOAL #1   Title Pt. will have 75-100% lumbar ROM   Time 4   Period Weeks   PT SHORT TERM GOAL #2   Title Pt. will have improved LLE SLR to 75-90  degrees   Time 4   Period Weeks   Status New   PT SHORT TERM GOAL #3   Title Pt. will be independent with HEP   Time 4   Period Weeks   Status New           PT Long Term Goals - 06/18/14 1210    PT LONG TERM GOAL #1   Title Pt. will report pain level of 3/10 average or below with subjective 50-75% improvement in symptoms   Time 8   Period Weeks   Status New   PT LONG TERM GOAL #2   Title Pt. will report able to standing and ambulate 2 hrs without symptoms interfering   Time 8   Period Weeks   Status New               Plan - 07/09/14 1611    Clinical Impression Statement responded well to MET,    Pt will benefit from skilled therapeutic intervention in order to improve on the following deficits Pain;Impaired flexibility;Postural dysfunction   PT Frequency 2x / week   PT Duration 8 weeks   PT Next Visit Plan Assess L QL, Leg length, progress as able with gym exercises   Consulted and Agree with Plan of Care Patient        Problem List Patient Active Problem List   Diagnosis Date Noted  . Hypercalcemia 03/23/2014  . Hypothyroidism 12/30/2010  . OSA (obstructive sleep apnea) 09/02/2010  . Atrial fibrillation 04/05/2009  . EDEMA 04/05/2009  . Prerenal azotemia 09/29/2008  . ERECTILE DYSFUNCTION 05/01/2008  . Diabetes mellitus with peripheral circulatory disorder, controlled 09/06/2007  . Hyperlipidemia 09/06/2007  . Essential hypertension 09/06/2007  . HYPERPLASIA PROSTATE UNS W/UR OBST & OTH LUTS 04/25/2007     [name], PTA  07/09/2014, 4:17 PM  [address] [place], [place], [address] Phone: [phone]   Fax:  [phone]

## 2014-07-09 NOTE — Therapy (Signed)
[place], [place], [address] Phone: [phone]   Fax:  [phone]  Physical Therapy Treatment  Patient Details  Name: [name] MRN: [id number] Date of Birth: 27-Oct-1945 Referring Provider:  Suella Broad, MD  Encounter Date: 07/09/2014      PT End of Session - 07/09/14 1536    Visit Number 5   PT Start Time 6962   PT Stop Time 9528   PT Time Calculation (min) 65 min   Activity Tolerance Patient tolerated treatment well;No increased pain   Behavior During Therapy [facility] for tasks assessed/performed      Past Medical History  Diagnosis Date  . Diabetes mellitus     type 2  . Hyperlipidemia   . Hypertension   . Allergic rhinitis     seasonal  . Hypothyroidism   . AF (atrial fibrillation)     persistent afib, s/p afib ablation x 2 with subsequent convergent procedure at [facility]  . CTS (carpal tunnel syndrome)     PMH of  . Renal calculus 2005    [facility]  . Atrial flutter     Past Surgical History  Procedure Laterality Date  . Cardioversion    . Afib 09/22/2008--11/03/2009      afib ablation x 2 by Dr Rayann Heman with subsequent convergent procedure at [facility]  . Total hip arthroplasty  06/2010    Depuy prosthesis replacement, Dr Alvan Dame  . Cardioversion  12/09/2010    Procedure: CARDIOVERSION;  Surgeon: Loralie Champagne, MD;  Location: [place];  Service: Cardiovascular;  Laterality: N/A;  . Total hip arthroplasty Right 2008     Depuy;Dr Alvan Dame  . Tonsillectomy    . Cardioversion  09/15/2011    Procedure: CARDIOVERSION;  Surgeon: Josue Hector, MD;  Location: [place];  Service: Cardiovascular;  Laterality: N/A;  office drawing labs wed 8/28  . Loop recorder implant Left 12-2010  . Cardiac catheterization  2005    non obstructive CAD  . Convergent procedure  2013    at [facility] for a fib  . Cardioversion N/A 07/18/2012    Procedure: CARDIOVERSION;  Surgeon: Thayer Headings, MD;   Location: [place];  Service: Cardiovascular;  Laterality: N/A;  . Joint replacement Right 2008, 2012  . Ablation  06/2013    atypical atrial flutter ablation by Dr Lehman Prom at [facility] (mitral annular flutter ablated)  . Colonoscopy  2009    There were no vitals filed for this visit.  Visit Diagnosis:  Midline low back pain without sciatica  Decreased range of motion of lumbar spine      Subjective Assessment - 07/09/14 1533    Subjective feeling some better.   Limitations Walking;Standing   How long can you sit comfortably? unlimited   Patient Stated Goals be able to to tolerate upright standing and prlonged gait for outdoor work at home, travel, etc.   Currently in Pain? Yes   Pain Score 3    Pain Location Back   Pain Orientation Right   Pain Descriptors / Indicators Aching   Pain Onset More than a month ago   Multiple Pain Sites No                         [facility] Adult PT Treatment/Exercise - 07/09/14 0001    Lumbar Exercises: Stretches   Passive Hamstring Stretch 30 seconds   Single Knee to Chest Stretch 20 seconds  Lower Trunk Rotation 5 reps   Hip Flexor Stretch 30 seconds   Lumbar Exercises: Aerobic   Elliptical Nustep L5X6   Lumbar Exercises: Supine   Other Supine Lumbar Exercises SKTC/DKTC with arms   Moist Heat Therapy   Number Minutes Moist Heat 15 Minutes   Moist Heat Location Lumbar Spine   Electrical Stimulation   Electrical Stimulation Location lumbar   Electrical Stimulation Parameters IFC  constant, Ramp static   Electrical Stimulation Goals Pain   Manual Therapy   Manual Therapy Muscle Energy Technique;Myofascial release   Manual therapy comments Left pelvis rotation fwd, bent knee pressure from PTA   Myofascial Release R QL                  PT Short Term Goals - 06/18/14 1209    PT SHORT TERM GOAL #1   Title Pt. will have 75-100% lumbar ROM   Time 4   Period Weeks   PT SHORT TERM GOAL #2   Title Pt. will have  improved LLE SLR to 75-90 degrees   Time 4   Period Weeks   Status New   PT SHORT TERM GOAL #3   Title Pt. will be independent with HEP   Time 4   Period Weeks   Status New           PT Long Term Goals - 06/18/14 1210    PT LONG TERM GOAL #1   Title Pt. will report pain level of 3/10 average or below with subjective 50-75% improvement in symptoms   Time 8   Period Weeks   Status New   PT LONG TERM GOAL #2   Title Pt. will report able to standing and ambulate 2 hrs without symptoms interfering   Time 8   Period Weeks   Status New               Plan - 07/09/14 1611    Clinical Impression Statement responded well to MET,    Pt will benefit from skilled therapeutic intervention in order to improve on the following deficits Pain;Impaired flexibility;Postural dysfunction   PT Frequency 2x / week   PT Duration 8 weeks   PT Next Visit Plan Assess L QL, Leg length, progress as able with gym exercises   Consulted and Agree with Plan of Care Patient        Problem List Patient Active Problem List   Diagnosis Date Noted  . Hypercalcemia 03/23/2014  . Hypothyroidism 12/30/2010  . OSA (obstructive sleep apnea) 09/02/2010  . Atrial fibrillation 04/05/2009  . EDEMA 04/05/2009  . Prerenal azotemia 09/29/2008  . ERECTILE DYSFUNCTION 05/01/2008  . Diabetes mellitus with peripheral circulatory disorder, controlled 09/06/2007  . Hyperlipidemia 09/06/2007  . Essential hypertension 09/06/2007  . HYPERPLASIA PROSTATE UNS W/UR OBST & OTH LUTS 04/25/2007    [name] 07/09/2014, 4:19 PM  [address] [place], [place], [address] Phone: [phone]   Fax:  [phone]

## 2014-07-14 ENCOUNTER — Ambulatory Visit: Payer: Medicare Other | Admitting: Physical Therapy

## 2014-07-14 ENCOUNTER — Encounter: Payer: Self-pay | Admitting: Physical Therapy

## 2014-07-14 DIAGNOSIS — M5386 Other specified dorsopathies, lumbar region: Secondary | ICD-10-CM

## 2014-07-14 DIAGNOSIS — M2560 Stiffness of unspecified joint, not elsewhere classified: Secondary | ICD-10-CM | POA: Diagnosis not present

## 2014-07-14 DIAGNOSIS — M545 Low back pain, unspecified: Secondary | ICD-10-CM

## 2014-07-14 NOTE — Therapy (Signed)
[place], [place], [address] Phone: [phone]   Fax:  [phone]  Physical Therapy Treatment  Patient Details  Name: [name] MRN: [id number] Date of Birth: January 17, 1945 Referring Provider:  Suella Broad, MD  Encounter Date: 07/14/2014      PT End of Session - 07/14/14 1022    Visit Number 6   PT Start Time 1657   PT Stop Time 9038   PT Time Calculation (min) 61 min   Activity Tolerance Patient tolerated treatment well      Past Medical History  Diagnosis Date  . Diabetes mellitus     type 2  . Hyperlipidemia   . Hypertension   . Allergic rhinitis     seasonal  . Hypothyroidism   . AF (atrial fibrillation)     persistent afib, s/p afib ablation x 2 with subsequent convergent procedure at [facility]  . CTS (carpal tunnel syndrome)     PMH of  . Renal calculus 2005    [facility]  . Atrial flutter     Past Surgical History  Procedure Laterality Date  . Cardioversion    . Afib 09/22/2008--11/03/2009      afib ablation x 2 by Dr Rayann Heman with subsequent convergent procedure at [facility]  . Total hip arthroplasty  06/2010    Depuy prosthesis replacement, Dr Alvan Dame  . Cardioversion  12/09/2010    Procedure: CARDIOVERSION;  Surgeon: Loralie Champagne, MD;  Location: [place];  Service: Cardiovascular;  Laterality: N/A;  . Total hip arthroplasty Right 2008     Depuy;Dr Alvan Dame  . Tonsillectomy    . Cardioversion  09/15/2011    Procedure: CARDIOVERSION;  Surgeon: Josue Hector, MD;  Location: [place];  Service: Cardiovascular;  Laterality: N/A;  office drawing labs wed 8/28  . Loop recorder implant Left 12-2010  . Cardiac catheterization  2005    non obstructive CAD  . Convergent procedure  2013    at [facility] for a fib  . Cardioversion N/A 07/18/2012    Procedure: CARDIOVERSION;  Surgeon: Thayer Headings, MD;  Location: [place];  Service: Cardiovascular;  Laterality: N/A;  . Joint  replacement Right 2008, 2012  . Ablation  06/2013    atypical atrial flutter ablation by Dr Lehman Prom at [facility] (mitral annular flutter ablated)  . Colonoscopy  2009    There were no vitals filed for this visit.  Visit Diagnosis:  Midline low back pain without sciatica  Decreased range of motion of lumbar spine      Subjective Assessment - 07/14/14 0937    Subjective Alittle less pain, but did some yardwork and had some increased pain   Currently in Pain? Yes   Pain Score 3    Pain Location Back   Pain Orientation Right   Pain Descriptors / Indicators Aching   Aggravating Factors  yard work                         [employer] Adult PT Treatment/Exercise - 07/14/14 0001    Lumbar Exercises: Stretches   Passive Hamstring Stretch 30 seconds   Single Knee to Chest Stretch 20 seconds   Hip Flexor Stretch 30 seconds   Quad Stretch 30 seconds;4 reps   Piriformis Stretch 3 reps;30 seconds   Lumbar Exercises: Aerobic   Elliptical Nustep [id number]   Lumbar Exercises: Machines for Strengthening   Other Lumbar Machine Exercise seated row 25#,  lats 25#, obliques 10#, hip abduction and extension 5#   Other Lumbar Machine Exercise 10# hip extension   Moist Heat Therapy   Number Minutes Moist Heat 15 Minutes   Moist Heat Location Lumbar Spine   Electrical Stimulation   Electrical Stimulation Location lumbar   Electrical Stimulation Parameters IFC static   Electrical Stimulation Goals Pain                PT Education - 07/14/14 1021    Education provided Yes   Education Details went over posture and body mechanics instruction for all ADL's and yardwork   Person(s) Educated Patient   Methods Explanation;Demonstration   Comprehension Verbalized understanding          PT Short Term Goals - 06/18/14 1209    PT SHORT TERM GOAL #1   Title Pt. will have 75-100% lumbar ROM   Time 4   Period Weeks   PT SHORT TERM GOAL #2   Title Pt. will have improved LLE SLR to 75-90  degrees   Time 4   Period Weeks   Status New   PT SHORT TERM GOAL #3   Title Pt. will be independent with HEP   Time 4   Period Weeks   Status New           PT Long Term Goals - 07/14/14 1024    PT LONG TERM GOAL #1   Title Pt. will report pain level of 3/10 average or below with subjective 50-75% improvement in symptoms   Status Partially Met   PT LONG TERM GOAL #2   Title Pt. will report able to standing and ambulate 2 hrs without symptoms interfering   Status On-going               Plan - 07/14/14 1023    Clinical Impression Statement He remains tight in the hip flexors and quads, has fair understanding of posture and body mechanics for ADL's.   PT Next Visit Plan Assess L QL, Leg length, progress as able with gym exercises   Consulted and Agree with Plan of Care Patient        Problem List Patient Active Problem List   Diagnosis Date Noted  . Hypercalcemia 03/23/2014  . Hypothyroidism 12/30/2010  . OSA (obstructive sleep apnea) 09/02/2010  . Atrial fibrillation 04/05/2009  . EDEMA 04/05/2009  . Prerenal azotemia 09/29/2008  . ERECTILE DYSFUNCTION 05/01/2008  . Diabetes mellitus with peripheral circulatory disorder, controlled 09/06/2007  . Hyperlipidemia 09/06/2007  . Essential hypertension 09/06/2007  . HYPERPLASIA PROSTATE UNS W/UR OBST & OTH LUTS 04/25/2007    Sumner Boast., PT 07/14/2014, 10:29 AM  [address] [place], [place], [address] Phone: [phone]   Fax:  [phone]

## 2014-07-16 ENCOUNTER — Ambulatory Visit: Payer: Medicare Other | Admitting: Rehabilitation

## 2014-07-16 DIAGNOSIS — M2560 Stiffness of unspecified joint, not elsewhere classified: Secondary | ICD-10-CM | POA: Diagnosis not present

## 2014-07-16 DIAGNOSIS — M545 Low back pain, unspecified: Secondary | ICD-10-CM

## 2014-07-16 NOTE — Therapy (Signed)
[place], [place], [address] Phone: [phone]   Fax:  [phone]  Physical Therapy Treatment  Patient Details  Name: [name] MRN: [id number] Date of Birth: [birth year] Referring Provider:  Suella Broad, MD  Encounter Date: 07/16/2014      PT End of Session - 07/16/14 0940    Visit Number 7   PT Start Time 0933   PT Stop Time 1018   PT Time Calculation (min) 45 min      Past Medical History  Diagnosis Date  . Diabetes mellitus     type 2  . Hyperlipidemia   . Hypertension   . Allergic rhinitis     seasonal  . Hypothyroidism   . AF (atrial fibrillation)     persistent afib, s/p afib ablation x 2 with subsequent convergent procedure at [facility]  . CTS (carpal tunnel syndrome)     PMH of  . Renal calculus 2005    [facility]  . Atrial flutter     Past Surgical History  Procedure Laterality Date  . Cardioversion    . Afib 09/22/2008--11/03/2009      afib ablation x 2 by Dr Rayann Heman with subsequent convergent procedure at [facility]  . Total hip arthroplasty  06/2010    Depuy prosthesis replacement, Dr Alvan Dame  . Cardioversion  12/09/2010    Procedure: CARDIOVERSION;  Surgeon: Loralie Champagne, MD;  Location: [place];  Service: Cardiovascular;  Laterality: N/A;  . Total hip arthroplasty Right 2008     Depuy;Dr Alvan Dame  . Tonsillectomy    . Cardioversion  09/15/2011    Procedure: CARDIOVERSION;  Surgeon: Josue Hector, MD;  Location: [place];  Service: Cardiovascular;  Laterality: N/A;  office drawing labs wed 8/28  . Loop recorder implant Left 12-2010  . Cardiac catheterization  2005    non obstructive CAD  . Convergent procedure  2013    at [facility] for a fib  . Cardioversion N/A 07/18/2012    Procedure: CARDIOVERSION;  Surgeon: Thayer Headings, MD;  Location: [place];  Service: Cardiovascular;  Laterality: N/A;  . Joint replacement Right 2008, 2012  . Ablation  06/2013   atypical atrial flutter ablation by Dr Lehman Prom at [facility] (mitral annular flutter ablated)  . Colonoscopy  2009    There were no vitals filed for this visit.  Visit Diagnosis:  Midline low back pain without sciatica      Subjective Assessment - 07/16/14 1008    Subjective States returns to Dr Nelva Bush tomorrow for f/u.    Wants to do estim/traction today without the exercise to see how much impact these modalities make and possibly get TENS and home pelvic traction unit.                           [place] Adult PT Treatment/Exercise - 07/16/14 0001    Lumbar Exercises: Stretches   Passive Hamstring Stretch 60 seconds   Passive Hamstring Stretch Limitations x3 ble   Single Knee to Chest Stretch 60 seconds   Single Knee to Chest Stretch Limitations x 3 ble   Lower Trunk Rotation Limitations 30 reps   Hip Flexor Stretch 30 seconds   Quad Stretch 60 seconds   Piriformis Stretch 3 reps;30 seconds   Electrical Stimulation   Electrical Stimulation Location lumbar   Electrical Stimulation Parameters IFC static   Electrical Stimulation Goals Pain   Traction  Type of Traction Lumbar   Max (lbs) 110   Hold Time static   Rest Time 90/90   Time 15'                PT Education - 07/16/14 1012    Education provided Yes   Education Details speak with MD about TENS and home tx unit   Person(s) Educated Patient   Comprehension Verbalized understanding          PT Short Term Goals - 06/18/14 1209    PT SHORT TERM GOAL #1   Title Pt. will have 75-100% lumbar ROM   Time 4   Period Weeks   PT SHORT TERM GOAL #2   Title Pt. will have improved LLE SLR to 75-90 degrees   Time 4   Period Weeks   Status New   PT SHORT TERM GOAL #3   Title Pt. will be independent with HEP   Time 4   Period Weeks   Status New           PT Long Term Goals - 07/14/14 1024    PT LONG TERM GOAL #1   Title Pt. will report pain level of 3/10 average or below with subjective 50-75%  improvement in symptoms   Status Partially Met   PT LONG TERM GOAL #2   Title Pt. will report able to standing and ambulate 2 hrs without symptoms interfering   Status On-going               Plan - 07/16/14 1013    Clinical Impression Statement Pt. states he is feeling some better in general, but still has pain with yardwork.    His goal is to be able to yardwork and other prolonged standing activity without as much pain   Rehab Potential Good        Problem List Patient Active Problem List   Diagnosis Date Noted  . Hypercalcemia 03/23/2014  . Hypothyroidism 12/30/2010  . OSA (obstructive sleep apnea) 09/02/2010  . Atrial fibrillation 04/05/2009  . EDEMA 04/05/2009  . Prerenal azotemia 09/29/2008  . ERECTILE DYSFUNCTION 05/01/2008  . Diabetes mellitus with peripheral circulatory disorder, controlled 09/06/2007  . Hyperlipidemia 09/06/2007  . Essential hypertension 09/06/2007  . HYPERPLASIA PROSTATE UNS W/UR OBST & OTH LUTS 04/25/2007    Volney American, PT 07/16/2014, 10:16 AM  [address] [place], [place], [address] Phone: [phone]   Fax:  [phone]

## 2014-07-17 ENCOUNTER — Ambulatory Visit: Payer: Medicare Other | Admitting: Rehabilitation

## 2014-07-21 ENCOUNTER — Ambulatory Visit: Payer: Medicare Other | Attending: Physical Medicine and Rehabilitation | Admitting: Physical Therapy

## 2014-07-21 ENCOUNTER — Encounter: Payer: Self-pay | Admitting: Physical Therapy

## 2014-07-21 DIAGNOSIS — M2560 Stiffness of unspecified joint, not elsewhere classified: Secondary | ICD-10-CM | POA: Insufficient documentation

## 2014-07-21 DIAGNOSIS — M5386 Other specified dorsopathies, lumbar region: Secondary | ICD-10-CM

## 2014-07-21 DIAGNOSIS — M545 Low back pain, unspecified: Secondary | ICD-10-CM

## 2014-07-21 NOTE — Therapy (Signed)
[place], [place], [address] Phone: [phone]   Fax:  [phone]  Physical Therapy Treatment  Patient Details  Name: [name] MRN: [id number] Date of Birth: 1946-01-03 Referring Provider:  Suella Broad, MD  Encounter Date: 07/21/2014      PT End of Session - 07/21/14 1009    Visit Number 8   PT Start Time 0935   PT Stop Time 7341   PT Time Calculation (min) 66 min   Activity Tolerance Patient tolerated treatment well   Behavior During Therapy [facility] for tasks assessed/performed      Past Medical History  Diagnosis Date  . Diabetes mellitus     type 2  . Hyperlipidemia   . Hypertension   . Allergic rhinitis     seasonal  . Hypothyroidism   . AF (atrial fibrillation)     persistent afib, s/p afib ablation x 2 with subsequent convergent procedure at [facility]  . CTS (carpal tunnel syndrome)     PMH of  . Renal calculus 2005    [facility]  . Atrial flutter     Past Surgical History  Procedure Laterality Date  . Cardioversion    . Afib 09/22/2008--11/03/2009      afib ablation x 2 by Dr Rayann Heman with subsequent convergent procedure at [facility]  . Total hip arthroplasty  06/2010    Depuy prosthesis replacement, Dr Alvan Dame  . Cardioversion  12/09/2010    Procedure: CARDIOVERSION;  Surgeon: Loralie Champagne, MD;  Location: [place];  Service: Cardiovascular;  Laterality: N/A;  . Total hip arthroplasty Right 2008     Depuy;Dr Alvan Dame  . Tonsillectomy    . Cardioversion  09/15/2011    Procedure: CARDIOVERSION;  Surgeon: Josue Hector, MD;  Location: [place];  Service: Cardiovascular;  Laterality: N/A;  office drawing labs wed 8/28  . Loop recorder implant Left 12-2010  . Cardiac catheterization  2005    non obstructive CAD  . Convergent procedure  2013    at [facility] for a fib  . Cardioversion N/A 07/18/2012    Procedure: CARDIOVERSION;  Surgeon: Thayer Headings, MD;  Location: [place];   Service: Cardiovascular;  Laterality: N/A;  . Joint replacement Right 2008, 2012  . Ablation  06/2013    atypical atrial flutter ablation by Dr Lehman Prom at [facility] (mitral annular flutter ablated)  . Colonoscopy  2009    There were no vitals filed for this visit.  Visit Diagnosis:  Midline low back pain without sciatica  Decreased range of motion of lumbar spine      Subjective Assessment - 07/21/14 0936    Subjective Im still having back problems, Im noticing some improvements but not pain free. Pt continues to notice some stiffness.   Limitations Walking;Standing   How long can you sit comfortably? unlimited   How long can you stand comfortably? 1hr   Patient Stated Goals be able to tolerate upright standing and prolonged gait for outdoor work at home, travel, etc.   Currently in Pain? Yes   Pain Score 3    Pain Location Back   Pain Orientation Right   Pain Descriptors / Indicators Sharp   Pain Type Chronic pain   Pain Onset More than a month ago   Pain Frequency Constant   Aggravating Factors  moving around    Pain Relieving Factors rest  [place] Adult PT Treatment/Exercise - 07/21/14 0001    Lumbar Exercises: Stretches   Passive Hamstring Stretch 60 seconds   Passive Hamstring Stretch Limitations x3 ble   Single Knee to Chest Stretch 60 seconds   Single Knee to Chest Stretch Limitations x 3 ble   Piriformis Stretch 3 reps;30 seconds   Piriformis Stretch Limitations Supine marching with Abs sets 10 reps    Lumbar Exercises: Supine   Dead Bug 10 reps;1 second  no UE movement   Bridge 20 reps   Other Supine Lumbar Exercises SLR with abs sets 10 reps    Other Supine Lumbar Exercises Bridge with marching 10 reps 2 set    Moist Heat Therapy   Number Minutes Moist Heat 15 Minutes   Electrical Stimulation   Electrical Stimulation Location lumbar   Electrical Stimulation Parameters IFC   Electrical Stimulation Goals Pain   Traction    Type of Traction Lumbar   Max (lbs) 110   Hold Time static   Rest Time 90/90   Time 12'   Manual Therapy   Manual Therapy --                  PT Short Term Goals - 07/21/14 1029    PT SHORT TERM GOAL #1   Title Pt. will have 75-100% lumbar ROM   Status On-going   PT SHORT TERM GOAL #2   Title Pt. will have improved LLE SLR to 75-90 degrees   Status On-going   PT SHORT TERM GOAL #3   Title Pt. will be independent with HEP   Status Partially Met           PT Long Term Goals - 07/21/14 1029    PT LONG TERM GOAL #2   Title Pt. will report able to standing and ambulate 2 hrs without symptoms interfering   Status On-going               Plan - 07/21/14 1027    Clinical Impression Statement Pt reports that he feel a little better. Pt able to tolerate exercises and passive stretching without increased pain/ Pt reports that he has been compliant with HEP.     Pt will benefit from skilled therapeutic intervention in order to improve on the following deficits Pain;Impaired flexibility;Postural dysfunction   PT Frequency 2x / week   PT Treatment/Interventions Patient/family education;Therapeutic activities;Moist Heat;Traction;Therapeutic exercise;Electrical Stimulation;Manual techniques   PT Next Visit Plan progress as able with gym exercises   Consulted and Agree with Plan of Care Patient        Problem List Patient Active Problem List   Diagnosis Date Noted  . Hypercalcemia 03/23/2014  . Hypothyroidism 12/30/2010  . OSA (obstructive sleep apnea) 09/02/2010  . Atrial fibrillation 04/05/2009  . EDEMA 04/05/2009  . Prerenal azotemia 09/29/2008  . ERECTILE DYSFUNCTION 05/01/2008  . Diabetes mellitus with peripheral circulatory disorder, controlled 09/06/2007  . Hyperlipidemia 09/06/2007  . Essential hypertension 09/06/2007  . HYPERPLASIA PROSTATE UNS W/UR OBST & OTH LUTS 04/25/2007    Scot Jun, PTA 07/21/2014, 10:33 AM  [address] [place], [place], [address] Phone: [phone]   Fax:  [phone]

## 2014-07-22 ENCOUNTER — Encounter: Payer: Self-pay | Admitting: Internal Medicine

## 2014-07-22 DIAGNOSIS — M4690 Unspecified inflammatory spondylopathy, site unspecified: Secondary | ICD-10-CM | POA: Diagnosis not present

## 2014-07-22 DIAGNOSIS — M5136 Other intervertebral disc degeneration, lumbar region: Secondary | ICD-10-CM | POA: Diagnosis not present

## 2014-07-22 DIAGNOSIS — M545 Low back pain: Secondary | ICD-10-CM | POA: Diagnosis not present

## 2014-07-23 ENCOUNTER — Ambulatory Visit: Payer: Medicare Other | Admitting: Rehabilitation

## 2014-07-24 ENCOUNTER — Encounter: Payer: Self-pay | Admitting: Internal Medicine

## 2014-07-24 NOTE — Progress Notes (Signed)
Patient requesting samples of Xarelto through MyChart.  Samples given: Xarelto 20 mg Lot: [id number] Exp: 6/18 # 2 bottles  Placed at front desk for patient pick up

## 2014-07-29 DIAGNOSIS — Z8639 Personal history of other endocrine, nutritional and metabolic disease: Secondary | ICD-10-CM | POA: Diagnosis not present

## 2014-07-29 DIAGNOSIS — N401 Enlarged prostate with lower urinary tract symptoms: Secondary | ICD-10-CM | POA: Diagnosis not present

## 2014-07-29 DIAGNOSIS — E169 Disorder of pancreatic internal secretion, unspecified: Secondary | ICD-10-CM | POA: Diagnosis not present

## 2014-07-29 DIAGNOSIS — N138 Other obstructive and reflux uropathy: Secondary | ICD-10-CM | POA: Diagnosis not present

## 2014-07-29 DIAGNOSIS — R81 Glycosuria: Secondary | ICD-10-CM | POA: Diagnosis not present

## 2014-07-29 DIAGNOSIS — R351 Nocturia: Secondary | ICD-10-CM | POA: Diagnosis not present

## 2014-08-09 ENCOUNTER — Encounter: Payer: Self-pay | Admitting: Internal Medicine

## 2014-08-10 ENCOUNTER — Encounter: Payer: Self-pay | Admitting: Internal Medicine

## 2014-08-10 ENCOUNTER — Ambulatory Visit (INDEPENDENT_AMBULATORY_CARE_PROVIDER_SITE_OTHER): Payer: Medicare Other | Admitting: Internal Medicine

## 2014-08-10 VITALS — BP 148/80 | HR 74 | Temp 97.9°F | Resp 14 | Wt 199.0 lb

## 2014-08-10 DIAGNOSIS — E1151 Type 2 diabetes mellitus with diabetic peripheral angiopathy without gangrene: Secondary | ICD-10-CM

## 2014-08-10 MED ORDER — REPAGLINIDE 1 MG PO TABS: 1.0000 mg | ORAL_TABLET | Freq: Three times a day (TID) | ORAL | Status: DC

## 2014-08-10 NOTE — Progress Notes (Signed)
Pre visit review using our clinic review tool, if applicable. No additional management support is needed unless otherwise documented below in the visit note. 

## 2014-08-10 NOTE — Progress Notes (Signed)
   Subjective:    Patient ID: [name], male    DOB: [birth year], 69 y.o.   MRN: [id number]  HPI [name] saw Dr. Gaynelle Arabian who found glucosuria. A1c was 7.3%. He questioned whether [name] was on an agent which increased glucose in the urine.  He has been on a low-carb diet. He's been missing the mid day dose of [name] intermittently. He wants to try the generic [name] which was associated with better control.   Ophthalmologic exams up-to-date. He had no retinopathy. Podiatry evaluation is also up-to-date.  Glucose is range 117-164.  His brother had testicular cancer.  A1c was 7.2 % on 05/12/14.BUN 30;creatinine 1.26 & GFR 60.60 on 03/17/14.  Review of Systems   Polyuria, polyphagia, polydipsia absent.  There is no blurred vision, double vision, or loss of vision.   No postural dizziness noted. Denied are numbness, tingling, or burning of the extremities.  No nonhealing skin lesions present.  Weight is stable.       Objective:   Physical Exam   Pertinent or positive findings include: He is deeply tanned. He has minor crepitus of the knees. Knee deep tendon reflexes are 0-1/2+ General appearance :adequately nourished; in no distress.  Eyes: No conjunctival inflammation or scleral icterus is present.  Oral exam:  Lips and gums are healthy appearing.There is no oropharyngeal erythema or exudate noted. Dental hygiene is good.  Heart:  Normal rate and regular rhythm. S1 and S2 normal without gallop, murmur, click, rub or other extra sounds    Lungs:Chest clear to auscultation; no wheezes, rhonchi,rales ,or rubs present.No increased work of breathing.   Abdomen: bowel sounds normal, soft and non-tender without masses, organomegaly or hernias noted.  No guarding or rebound. No flank tenderness to percussion.  Vascular : all pulses equal ; no bruits present.  Skin:Warm & dry.  Intact without suspicious lesions or rashes ; no tenting or jaundice   Lymphatic: No  lymphadenopathy is noted about the head, neck, axilla.   Neuro: Strength, tone normal.        Assessment & Plan:  #1 adequate DM control Plan:trial of [name] @ his request

## 2014-08-10 NOTE — Patient Instructions (Signed)
Labs late Sept 2016

## 2014-08-11 ENCOUNTER — Encounter: Payer: Self-pay | Admitting: Internal Medicine

## 2014-08-13 ENCOUNTER — Other Ambulatory Visit: Payer: Self-pay | Admitting: *Deleted

## 2014-08-13 MED ORDER — RIVAROXABAN 20 MG PO TABS: 20.0000 mg | ORAL_TABLET | Freq: Every day | ORAL | Status: DC

## 2014-08-17 HISTORY — PX: CARDIOVERSION: SHX1299

## 2014-08-18 ENCOUNTER — Encounter: Payer: Self-pay | Admitting: Internal Medicine

## 2014-08-25 DIAGNOSIS — H4052X1 Glaucoma secondary to other eye disorders, left eye, mild stage: Secondary | ICD-10-CM | POA: Diagnosis not present

## 2014-08-27 ENCOUNTER — Encounter: Payer: Self-pay | Admitting: Internal Medicine

## 2014-08-31 ENCOUNTER — Other Ambulatory Visit: Payer: Self-pay | Admitting: *Deleted

## 2014-08-31 MED ORDER — APIXABAN 5 MG PO TABS: 5.0000 mg | ORAL_TABLET | Freq: Two times a day (BID) | ORAL | Status: DC

## 2014-08-31 MED ORDER — APIXABAN 5 MG PO TABS
5.0000 mg | ORAL_TABLET | Freq: Two times a day (BID) | ORAL | Status: DC
Start: 2014-08-31 — End: 2014-08-31

## 2014-08-31 NOTE — Addendum Note (Signed)
Addended by: Janan Halter F on: 08/31/2014 03:48 PM   Modules accepted: Orders

## 2014-09-04 DIAGNOSIS — N138 Other obstructive and reflux uropathy: Secondary | ICD-10-CM | POA: Diagnosis not present

## 2014-09-04 DIAGNOSIS — R351 Nocturia: Secondary | ICD-10-CM | POA: Diagnosis not present

## 2014-09-04 DIAGNOSIS — N401 Enlarged prostate with lower urinary tract symptoms: Secondary | ICD-10-CM | POA: Diagnosis not present

## 2014-09-12 DIAGNOSIS — Z87891 Personal history of nicotine dependence: Secondary | ICD-10-CM | POA: Diagnosis not present

## 2014-09-12 DIAGNOSIS — E119 Type 2 diabetes mellitus without complications: Secondary | ICD-10-CM | POA: Diagnosis not present

## 2014-09-12 DIAGNOSIS — I10 Essential (primary) hypertension: Secondary | ICD-10-CM | POA: Diagnosis not present

## 2014-09-12 DIAGNOSIS — I4891 Unspecified atrial fibrillation: Secondary | ICD-10-CM | POA: Diagnosis not present

## 2014-09-12 DIAGNOSIS — D699 Hemorrhagic condition, unspecified: Secondary | ICD-10-CM | POA: Diagnosis not present

## 2014-09-12 DIAGNOSIS — I484 Atypical atrial flutter: Secondary | ICD-10-CM | POA: Diagnosis not present

## 2014-09-12 DIAGNOSIS — R000 Tachycardia, unspecified: Secondary | ICD-10-CM | POA: Diagnosis not present

## 2014-09-12 DIAGNOSIS — I4892 Unspecified atrial flutter: Secondary | ICD-10-CM | POA: Diagnosis not present

## 2014-09-12 DIAGNOSIS — R079 Chest pain, unspecified: Secondary | ICD-10-CM | POA: Diagnosis not present

## 2014-09-12 DIAGNOSIS — Z7901 Long term (current) use of anticoagulants: Secondary | ICD-10-CM | POA: Diagnosis not present

## 2014-09-15 ENCOUNTER — Other Ambulatory Visit: Payer: Self-pay | Admitting: Emergency Medicine

## 2014-09-15 DIAGNOSIS — I10 Essential (primary) hypertension: Secondary | ICD-10-CM | POA: Diagnosis not present

## 2014-09-15 DIAGNOSIS — E039 Hypothyroidism, unspecified: Secondary | ICD-10-CM

## 2014-09-15 DIAGNOSIS — I4891 Unspecified atrial fibrillation: Secondary | ICD-10-CM | POA: Diagnosis not present

## 2014-09-15 DIAGNOSIS — Z9889 Other specified postprocedural states: Secondary | ICD-10-CM | POA: Diagnosis not present

## 2014-09-15 DIAGNOSIS — I484 Atypical atrial flutter: Secondary | ICD-10-CM | POA: Diagnosis not present

## 2014-09-15 MED ORDER — LEVOTHYROXINE SODIUM 25 MCG PO TABS: ORAL_TABLET | ORAL | Status: DC

## 2014-09-19 ENCOUNTER — Encounter: Payer: Self-pay | Admitting: Internal Medicine

## 2014-09-29 ENCOUNTER — Ambulatory Visit (INDEPENDENT_AMBULATORY_CARE_PROVIDER_SITE_OTHER): Payer: Medicare Other

## 2014-09-29 DIAGNOSIS — Z23 Encounter for immunization: Secondary | ICD-10-CM | POA: Diagnosis not present

## 2014-10-02 ENCOUNTER — Other Ambulatory Visit (INDEPENDENT_AMBULATORY_CARE_PROVIDER_SITE_OTHER): Payer: Medicare Other

## 2014-10-02 DIAGNOSIS — E1151 Type 2 diabetes mellitus with diabetic peripheral angiopathy without gangrene: Secondary | ICD-10-CM

## 2014-10-02 LAB — MICROALBUMIN / CREATININE URINE RATIO
Creatinine,U: 328.7 mg/dL
Microalb Creat Ratio: 5.3 mg/g (ref 0.0–30.0)
Microalb, Ur: 17.5 mg/dL — ABNORMAL HIGH (ref 0.0–1.9)

## 2014-10-02 LAB — HEMOGLOBIN A1C: Hgb A1c MFr Bld: 6.9 % — ABNORMAL HIGH (ref 4.6–6.5)

## 2014-10-05 ENCOUNTER — Ambulatory Visit (INDEPENDENT_AMBULATORY_CARE_PROVIDER_SITE_OTHER): Payer: Medicare Other | Admitting: Ophthalmology

## 2014-10-07 ENCOUNTER — Encounter: Payer: Self-pay | Admitting: Internal Medicine

## 2014-10-12 ENCOUNTER — Encounter: Payer: Self-pay | Admitting: Internal Medicine

## 2014-10-19 ENCOUNTER — Ambulatory Visit (HOSPITAL_COMMUNITY)
Admission: RE | Admit: 2014-10-19 | Discharge: 2014-10-19 | Disposition: A | Discharge status: Home / Self Care | Payer: Medicare Other | Source: Ambulatory Visit | Attending: Nurse Practitioner | Admitting: Nurse Practitioner

## 2014-10-19 ENCOUNTER — Encounter (HOSPITAL_COMMUNITY): Payer: Self-pay | Admitting: Nurse Practitioner

## 2014-10-19 ENCOUNTER — Ambulatory Visit (INDEPENDENT_AMBULATORY_CARE_PROVIDER_SITE_OTHER): Payer: Medicare Other | Admitting: Ophthalmology

## 2014-10-19 VITALS — BP 152/84 | HR 70 | Ht 68.0 in | Wt 202.6 lb

## 2014-10-19 DIAGNOSIS — E785 Hyperlipidemia, unspecified: Secondary | ICD-10-CM | POA: Insufficient documentation

## 2014-10-19 DIAGNOSIS — I10 Essential (primary) hypertension: Secondary | ICD-10-CM | POA: Insufficient documentation

## 2014-10-19 DIAGNOSIS — I481 Persistent atrial fibrillation: Secondary | ICD-10-CM | POA: Diagnosis not present

## 2014-10-19 DIAGNOSIS — Z833 Family history of diabetes mellitus: Secondary | ICD-10-CM | POA: Insufficient documentation

## 2014-10-19 DIAGNOSIS — Z7902 Long term (current) use of antithrombotics/antiplatelets: Secondary | ICD-10-CM | POA: Diagnosis not present

## 2014-10-19 DIAGNOSIS — Z79899 Other long term (current) drug therapy: Secondary | ICD-10-CM | POA: Insufficient documentation

## 2014-10-19 DIAGNOSIS — I4891 Unspecified atrial fibrillation: Secondary | ICD-10-CM | POA: Diagnosis not present

## 2014-10-19 DIAGNOSIS — Z823 Family history of stroke: Secondary | ICD-10-CM | POA: Diagnosis not present

## 2014-10-19 DIAGNOSIS — E039 Hypothyroidism, unspecified: Secondary | ICD-10-CM | POA: Insufficient documentation

## 2014-10-19 DIAGNOSIS — E119 Type 2 diabetes mellitus without complications: Secondary | ICD-10-CM | POA: Insufficient documentation

## 2014-10-19 DIAGNOSIS — Z87891 Personal history of nicotine dependence: Secondary | ICD-10-CM | POA: Insufficient documentation

## 2014-10-19 DIAGNOSIS — I4819 Other persistent atrial fibrillation: Secondary | ICD-10-CM

## 2014-10-19 MED ORDER — RIVAROXABAN 20 MG PO TABS: 20.0000 mg | ORAL_TABLET | Freq: Every day | ORAL | Status: DC

## 2014-10-19 NOTE — Progress Notes (Signed)
Patient ID: [name], male   DOB: 07/17/1945, 69 y.o.   MRN: [id number]     Primary Care Physician: Unice Cobble, MD Referring Physician: Dr. Vick Frees is a 69 y.o. male with a h/o persistent afib with multiple ablations/cardioversions that is here in the afib clinic today fo rf/u  DCCV while in [place], 8/27, visiting his daughter who had just had a baby. While he was visiting her in the hospital, he developed  afib with rvr and was cardioverted in the ER, which did restore rhythm. He continued to have palpitations and did see a cardiologist f/u and was told to increase his metoprolol to 150 mg bid. This did help and he then went on to cruise recently with his wife and did not have any other issues. He feels well today, and would like to return to his previous metoprolol dose. Continues on xarelto with bleeding issues.  Today, he denies symptoms of palpitations, chest pain, shortness of breath, orthopnea, PND, lower extremity edema, dizziness, presyncope, syncope, or neurologic sequela. The patient is tolerating medications without difficulties and is otherwise without complaint today.   Past Medical History  Diagnosis Date  . Diabetes mellitus     type 2  . Hyperlipidemia   . Hypertension   . Allergic rhinitis     seasonal  . Hypothyroidism   . AF (atrial fibrillation) (HCC)     persistent afib, s/p afib ablation x 2 with subsequent convergent procedure at [facility]  . CTS (carpal tunnel syndrome)     PMH of  . Renal calculus 2005    [facility]  . Atrial flutter [facility])    Past Surgical History  Procedure Laterality Date  . Cardioversion    . Afib 09/22/2008--11/03/2009      afib ablation x 2 by Dr Rayann Heman with subsequent convergent procedure at [facility]  . Total hip arthroplasty  06/2010    Depuy prosthesis replacement, Dr Alvan Dame  . Cardioversion  12/09/2010    Procedure: CARDIOVERSION;  Surgeon: Loralie Champagne, MD;  Location: [place];  Service: Cardiovascular;   Laterality: N/A;  . Total hip arthroplasty Right 2008     Depuy;Dr Alvan Dame  . Tonsillectomy    . Cardioversion  09/15/2011    Procedure: CARDIOVERSION;  Surgeon: Josue Hector, MD;  Location: [place];  Service: Cardiovascular;  Laterality: N/A;  office drawing labs wed 8/28  . Loop recorder implant Left 12-2010  . Cardiac catheterization  2005    non obstructive CAD  . Convergent procedure  2013    at [facility] for a fib  . Cardioversion N/A 07/18/2012    Procedure: CARDIOVERSION;  Surgeon: Thayer Headings, MD;  Location: [place];  Service: Cardiovascular;  Laterality: N/A;  . Joint replacement Right 2008, 2012  . Ablation  06/2013    atypical atrial flutter ablation by Dr Lehman Prom at [facility] (mitral annular flutter ablated)  . Colonoscopy  2009    Current Outpatient Prescriptions  Medication Sig Dispense Refill  . amLODipine (NORVASC) 10 MG tablet TAKE 1 TABLET DAILY 90 tablet 3  . levothyroxine (SYNTHROID, LEVOTHROID) 25 MCG tablet TAKE ONE AND ONE-HALF TABLETS BY MOUTH EVERY TUESDAY, THURSDAY AND SATURDAY AND 1 TABLET BY MOUTH ON ALL OTHER DAYS OF THE WEEK. 111 tablet 1  . Magnesium Oxide 400 MG CAPS Take 1 capsule (400 mg total) by mouth 2 (two) times daily. 180 capsule 3  . metFORMIN (GLUCOPHAGE-XR) 500 MG 24 hr tablet TAKE 2 TABLETS (1000MG )  TWICE A DAY WITH MEALS 360 tablet 3  . metoprolol succinate (TOPROL-XL) 100 MG 24 hr tablet Take one tablet by mouth twice daily and an extra 1/2 tablet by mouth daily as needed for afib 225 tablet 3  . rivaroxaban (XARELTO) 20 MG TABS tablet Take 1 tablet (20 mg total) by mouth daily with supper. 90 tablet 3  . traMADol (ULTRAM) 50 MG tablet Take 1 tablet (50 mg total) by mouth every 8 (eight) hours as needed. 30 tablet 0  . quinapril (ACCUPRIL) 40 MG tablet TAKE 1 TABLET DAILY (Patient not taking: Reported on 10/19/2014) 90 tablet 3  . repaglinide (PRANDIN) 1 MG tablet Take 1 tablet (1 mg total) by mouth 3 (three) times daily before meals. 270 tablet  0   No current facility-administered medications for this encounter.    No Known Allergies  Social History   Social History  . Marital Status: Married    Spouse Name: Izora Gala  . Number of Children: Y  . Years of Education: N/A   Occupational History  . BUYER    Social History Main Topics  . Smoking status: Former Smoker -- 0.90 packs/day for 8 years    Types: Cigarettes    Quit date: 01/16/1974  . Smokeless tobacco: Not on file     Comment: smoked  1966- 1976,up to 1 ppd  . Alcohol Use: Yes     Comment: Socially; "sips socially"( essentially none)  . Drug Use: No  . Sexual Activity: Not on file   Other Topics Concern  . Not on file   Social History Narrative    Family History  Problem Relation Age of Onset  . Lung cancer Mother     non smoker  . Stroke Mother 42  . Diabetes Father   . Stroke Father     mid 13s  . Testicular cancer Brother   . Allergies Maternal Grandmother   . Cancer Maternal Grandmother     unsure  . Allergies Brother   . Heart attack Neg Hx     ROS- All systems are reviewed and negative except as per the HPI above  Physical Exam: Filed Vitals:   10/19/14 1027  BP: 152/84  Pulse: 70  Height: 5\' 8"  (1.727 m)  Weight: 202 lb 9.6 oz (91.899 kg)    GEN- The patient is well appearing, alert and oriented x 3 today.   Head- normocephalic, atraumatic Eyes-  Sclera clear, conjunctiva pink Ears- hearing intact Oropharynx- clear Neck- supple, no JVP Lymph- no cervical lymphadenopathy Lungs- Clear to ausculation bilaterally, normal work of breathing Heart- Regular rate and rhythm, no murmurs, rubs or gallops, PMI not laterally displaced GI- soft, NT, ND, + BS Extremities- no clubbing, cyanosis, or edema MS- no significant deformity or atrophy Skin- no rash or lesion Psych- euthymic mood, full affect Neuro- strength and sensation are intact  EKG- Sinus brady at 58 bpm, Pr int 152 ms, QRS 64 ms, QTc 422 ms. Epic records  reviewed.    Assessment and Plan: 1. Afib Feeling better in SR after successful DCCV He can gradually cut back on metoprolol to 100 mg bid from recent 150 mg bid dose, if he can do so without palpitations. Continue xarelto  F/u afib clinic as needed F/u Dr. Rayann Heman as scheduled   Geroge Baseman. Sherrie Marsan, [facility] [address] [place], [place] [address] [phone]

## 2014-10-19 NOTE — Patient Instructions (Signed)
Try to decrease metoprolol in pm down to 100mg  over next 3-5 days then try with AM dose as well. Goal is 100mg  of metoprolol twice daily

## 2014-10-20 DIAGNOSIS — M5136 Other intervertebral disc degeneration, lumbar region: Secondary | ICD-10-CM | POA: Diagnosis not present

## 2014-10-20 DIAGNOSIS — M4696 Unspecified inflammatory spondylopathy, lumbar region: Secondary | ICD-10-CM | POA: Diagnosis not present

## 2014-10-20 DIAGNOSIS — M545 Low back pain: Secondary | ICD-10-CM | POA: Diagnosis not present

## 2014-10-23 ENCOUNTER — Other Ambulatory Visit: Payer: Self-pay | Admitting: Emergency Medicine

## 2014-10-23 DIAGNOSIS — E1151 Type 2 diabetes mellitus with diabetic peripheral angiopathy without gangrene: Secondary | ICD-10-CM

## 2014-10-23 MED ORDER — REPAGLINIDE 1 MG PO TABS: 1.0000 mg | ORAL_TABLET | Freq: Three times a day (TID) | ORAL | Status: DC

## 2014-10-30 ENCOUNTER — Other Ambulatory Visit (INDEPENDENT_AMBULATORY_CARE_PROVIDER_SITE_OTHER): Payer: Medicare Other

## 2014-10-30 ENCOUNTER — Ambulatory Visit (INDEPENDENT_AMBULATORY_CARE_PROVIDER_SITE_OTHER): Payer: Medicare Other | Admitting: Internal Medicine

## 2014-10-30 ENCOUNTER — Encounter: Payer: Self-pay | Admitting: Internal Medicine

## 2014-10-30 VITALS — BP 136/70 | HR 77 | Ht 68.0 in | Wt 204.0 lb

## 2014-10-30 DIAGNOSIS — Z0000 Encounter for general adult medical examination without abnormal findings: Secondary | ICD-10-CM | POA: Diagnosis not present

## 2014-10-30 DIAGNOSIS — E1151 Type 2 diabetes mellitus with diabetic peripheral angiopathy without gangrene: Secondary | ICD-10-CM

## 2014-10-30 DIAGNOSIS — E039 Hypothyroidism, unspecified: Secondary | ICD-10-CM

## 2014-10-30 LAB — TSH: TSH: 4.87 u[IU]/mL — ABNORMAL HIGH (ref 0.35–4.50)

## 2014-10-30 NOTE — Progress Notes (Signed)
   Subjective:    Patient ID: [name], male    DOB: January 28, 1945, 68 y.o.   MRN: [id number]  HPI Medicare Wellness Visit: Psychosocial and medical history were reviewed as required by Medicare (history related to abuse, antisocial behavior , firearm risk). Social history: Caffeine: One cup daily Alcohol:  Minimally Tobacco use: Quit 1976 Exercise: Yard work only Hospital doctor risk: No Limitations of activities of daily living: No Seatbelt/ smoke alarm use: Yes  Healthcare Power of Attorney/Living Will status and End of Life process assessment : up-to-date Ophthalmologic exam status: Up-to-date Hearing evaluation status: Completed today; whisper heard at 6 feet Orientation: Oriented X 3 Memory and recall: Good Math testing: Good Depression/anxiety assessment: No Foreign travel history: [place] within 3 months Immunization status for influenza/pneumonia/ shingles /tetanus: Up-to-date Transfusion history: No Preventive health care maintenance status: Colonoscopy as per protocol/standard care: 2009; he has no GI symptoms. Dental care: Annually Chart reviewed and updated. Active issues reviewed and addressed as documented below.  He's been compliant with his medicines without adverse effects.  He eats red meat 4 times a week and 5 feet 4 times a week. His wife cooks with salt. As noted he has no regular exercise program.  Blood pressure averages in the 140s at home. Glucoses range 120-140.  His TSH was completed 03/17/14; it was 4.38.  He has nocturia at least twice a night. He saw his Urologist. PSA was not elevated.  He was cardioverted in August while in [place]. He had surgical ablation in 2013; but he has had cardioversion 2-3 times since.  Review of Systems  Chest pain, palpitations, tachycardia, exertional dyspnea, paroxysmal nocturnal dyspnea, claudication or edema are absent. No unexplained weight loss, abdominal pain, significant dyspepsia, dysphagia,  melena, rectal bleeding, or persistently small caliber stools. Dysuria, pyuria, hematuria, frequency, or polyuria are denied. Change in hair, skin, nails denied. No bowel changes of constipation or diarrhea. No intolerance to heat or cold.     Objective:   Physical Exam  Pertinent or positive findings include: Bilateral ptosis present. He is deeply tanned. Abdomen is protuberant.  General appearance :adequately nourished; in no distress.  Eyes: No conjunctival inflammation or scleral icterus is present.  Oral exam:  Lips and gums are healthy appearing.There is no oropharyngeal erythema or exudate noted. Dental hygiene is good.  Heart:  Normal rate and regular rhythm. S1 and S2 normal without gallop, murmur, click, rub or other extra sounds    Lungs:Chest clear to auscultation; no wheezes, rhonchi,rales ,or rubs present.No increased work of breathing.   Abdomen: bowel sounds normal, soft and non-tender without masses, organomegaly or hernias noted.  No guarding or rebound.   Vascular : all pulses equal ; no bruits present.  Skin:Warm & dry.  Intact without suspicious lesions or rashes ; no tenting or jaundice   Lymphatic: No lymphadenopathy is noted about the head, neck, axilla.   Neuro: Strength, tone & DTRs normal.     Assessment & Plan:  #1 Medicare Wellness Exam; criteria met ; data entered #2 See Current Assessment & Plan in Problem List under specific Diagnosis. The labs will be reviewed and risks and options assessed.  Written recommendations will be provided by mail or directly through My Chart. Further evaluation or change in medical therapy will be directed by those results.

## 2014-10-30 NOTE — Patient Instructions (Signed)
  Your next office appointment will be determined based upon review of your pending labs  and  xrays  Those written interpretation of the lab results and instructions will be transmitted to you by My Chart   Critical results will be called.   Followup as needed for any active or acute issue. Please report any significant change in your symptoms. 

## 2014-10-30 NOTE — Assessment & Plan Note (Signed)
A1c , urine microalbumin, BMET in late 01/2015

## 2014-10-30 NOTE — Assessment & Plan Note (Signed)
TSH 

## 2014-10-30 NOTE — Progress Notes (Signed)
Pre visit review using our clinic review tool, if applicable. No additional management support is needed unless otherwise documented below in the visit note. 

## 2014-10-31 ENCOUNTER — Other Ambulatory Visit: Payer: Self-pay | Admitting: Internal Medicine

## 2014-10-31 DIAGNOSIS — E039 Hypothyroidism, unspecified: Secondary | ICD-10-CM

## 2014-10-31 DIAGNOSIS — E1151 Type 2 diabetes mellitus with diabetic peripheral angiopathy without gangrene: Secondary | ICD-10-CM

## 2014-11-10 ENCOUNTER — Ambulatory Visit (INDEPENDENT_AMBULATORY_CARE_PROVIDER_SITE_OTHER): Payer: Medicare Other | Admitting: Ophthalmology

## 2014-11-10 DIAGNOSIS — H43813 Vitreous degeneration, bilateral: Secondary | ICD-10-CM | POA: Diagnosis not present

## 2014-11-10 DIAGNOSIS — I10 Essential (primary) hypertension: Secondary | ICD-10-CM

## 2014-11-10 DIAGNOSIS — E113293 Type 2 diabetes mellitus with mild nonproliferative diabetic retinopathy without macular edema, bilateral: Secondary | ICD-10-CM | POA: Diagnosis not present

## 2014-11-10 DIAGNOSIS — E11319 Type 2 diabetes mellitus with unspecified diabetic retinopathy without macular edema: Secondary | ICD-10-CM | POA: Diagnosis not present

## 2014-11-10 DIAGNOSIS — H3533 Angioid streaks of macula: Secondary | ICD-10-CM

## 2014-11-19 ENCOUNTER — Encounter: Payer: Self-pay | Admitting: Internal Medicine

## 2014-11-20 ENCOUNTER — Other Ambulatory Visit: Payer: Self-pay | Admitting: Emergency Medicine

## 2014-11-20 DIAGNOSIS — E039 Hypothyroidism, unspecified: Secondary | ICD-10-CM

## 2014-11-20 MED ORDER — LEVOTHYROXINE SODIUM 25 MCG PO TABS: ORAL_TABLET | ORAL | Status: DC

## 2014-12-31 ENCOUNTER — Encounter: Payer: Self-pay | Admitting: Internal Medicine

## 2015-01-27 ENCOUNTER — Other Ambulatory Visit (INDEPENDENT_AMBULATORY_CARE_PROVIDER_SITE_OTHER): Payer: Medicare Other

## 2015-01-27 DIAGNOSIS — E039 Hypothyroidism, unspecified: Secondary | ICD-10-CM

## 2015-01-27 DIAGNOSIS — E1151 Type 2 diabetes mellitus with diabetic peripheral angiopathy without gangrene: Secondary | ICD-10-CM

## 2015-01-27 LAB — MICROALBUMIN / CREATININE URINE RATIO
Creatinine,U: 192.3 mg/dL
Microalb Creat Ratio: 11.9 mg/g (ref 0.0–30.0)
Microalb, Ur: 22.9 mg/dL — ABNORMAL HIGH (ref 0.0–1.9)

## 2015-01-27 LAB — HEMOGLOBIN A1C: Hgb A1c MFr Bld: 7.7 % — ABNORMAL HIGH (ref 4.6–6.5)

## 2015-01-27 LAB — TSH: TSH: 3.45 u[IU]/mL (ref 0.35–4.50)

## 2015-02-03 ENCOUNTER — Ambulatory Visit (INDEPENDENT_AMBULATORY_CARE_PROVIDER_SITE_OTHER): Payer: Medicare Other | Admitting: Podiatry

## 2015-02-03 ENCOUNTER — Ambulatory Visit (INDEPENDENT_AMBULATORY_CARE_PROVIDER_SITE_OTHER): Payer: Medicare Other

## 2015-02-03 ENCOUNTER — Ambulatory Visit (INDEPENDENT_AMBULATORY_CARE_PROVIDER_SITE_OTHER): Payer: Medicare Other | Admitting: Internal Medicine

## 2015-02-03 ENCOUNTER — Encounter: Payer: Self-pay | Admitting: Internal Medicine

## 2015-02-03 VITALS — BP 160/82 | HR 85 | Temp 98.0°F | Resp 16 | Ht 68.0 in | Wt 199.0 lb

## 2015-02-03 DIAGNOSIS — I10 Essential (primary) hypertension: Secondary | ICD-10-CM

## 2015-02-03 DIAGNOSIS — Z7189 Other specified counseling: Secondary | ICD-10-CM | POA: Diagnosis not present

## 2015-02-03 DIAGNOSIS — M79675 Pain in left toe(s): Secondary | ICD-10-CM | POA: Diagnosis not present

## 2015-02-03 DIAGNOSIS — L600 Ingrowing nail: Secondary | ICD-10-CM

## 2015-02-03 DIAGNOSIS — L03012 Cellulitis of left finger: Secondary | ICD-10-CM

## 2015-02-03 DIAGNOSIS — Z23 Encounter for immunization: Secondary | ICD-10-CM | POA: Diagnosis not present

## 2015-02-03 DIAGNOSIS — E1151 Type 2 diabetes mellitus with diabetic peripheral angiopathy without gangrene: Secondary | ICD-10-CM | POA: Diagnosis not present

## 2015-02-03 DIAGNOSIS — Z7184 Encounter for health counseling related to travel: Secondary | ICD-10-CM

## 2015-02-03 DIAGNOSIS — L03032 Cellulitis of left toe: Secondary | ICD-10-CM

## 2015-02-03 LAB — POCT CBG (FASTING - GLUCOSE)-MANUAL ENTRY: Glucose Fasting, POC: 150 mg/dL — AB (ref 70–99)

## 2015-02-03 MED ORDER — AZITHROMYCIN 250 MG PO TABS: ORAL_TABLET | ORAL | Status: DC

## 2015-02-03 NOTE — Progress Notes (Signed)
Pre visit review using our clinic review tool, if applicable. No additional management support is needed unless otherwise documented below in the visit note. 

## 2015-02-03 NOTE — Progress Notes (Signed)
   Subjective:    Patient ID: [name], male    DOB: 03/24/1945, 70 y.o.   MRN: [id number]  HPI  Pt presents with pain on the distal medial side of his left big toe, lasting 1 month. Pt states that the painwas worse than now and sensitive to the touch, he denied any redness or warmth to area  Review of Systems  All other systems reviewed and are negative.      Objective:   Physical Exam        Assessment & Plan:

## 2015-02-03 NOTE — Patient Instructions (Signed)

## 2015-02-03 NOTE — Progress Notes (Signed)
Subjective:     Patient ID: [name], male   DOB: [birth year], 70 y.o.   MRN: [id number]  HPI patient presents stating I have had a lot of problems with my big toe left and I wanted to get it looked at and it is at times hard to wear shoe. Patient is due to go on a cruise to the [place] and wanted to have it looked at first   Review of Systems  All other systems reviewed and are negative.      Objective:   Physical Exam  Constitutional: He is oriented to person, place, and time.  Cardiovascular: Intact distal pulses.   Musculoskeletal: Normal range of motion.  Neurological: He is oriented to person, place, and time.  Skin: Skin is warm.  Nursing note and vitals reviewed.  neurovascular status intact muscle strength adequate range of motion within normal limits with patient noted to have discomfort in both the interphalangeal joint left big toe and the medial border the left hallux with mild incurvation of the bed and distal redness noted. 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

## 2015-02-03 NOTE — Patient Instructions (Signed)
We have sent in azithromycin that you can fill and take if needed on the vacation. Take 2 pills on the first day, then 1 pill a day until gone.

## 2015-02-06 ENCOUNTER — Encounter: Payer: Self-pay | Admitting: Internal Medicine

## 2015-02-06 DIAGNOSIS — Z7184 Encounter for health counseling related to travel: Secondary | ICD-10-CM | POA: Insufficient documentation

## 2015-02-06 NOTE — Assessment & Plan Note (Signed)
He is taking metformin and prandin for the diabetes and is on quinapril. Will need to review records for complication listed above. Talked to him about dietary changes and he would like to work with that for 3 months and if no improvement we will change his meds.

## 2015-02-06 NOTE — Assessment & Plan Note (Signed)
Has not taken his meds yet today, taking amlodipine and metoprolol and quinapril. Review of vitals shows that his BP is usually at goal. Will not adjust today. He will be more vigilant.

## 2015-02-06 NOTE — Progress Notes (Signed)
   Subjective:    Patient ID: [name], male    DOB: Oct 18, 1945, 70 y.o.   MRN: [id number]  HPI The patient is a 70 YO man coming in with several concerns. First is his sugars (HgA1c just checked and up, significant dietary changes for the worse over the holidays and not exercising at all, taking metformin and prandin and quinapril, not complicated), his blood pressure (generally well controlled, not taken his meds today, taking quinapril and metoprolol, complication listed) and he is going on recent trip (many family members have been sick and e is starting to feel sick, leaving in 1 day so wants to have antibiotics to take if worsening symptoms on his trip). No other concerns.   Review of Systems  Constitutional: Positive for activity change, appetite change and unexpected weight change. Negative for fever, chills and fatigue.  Eyes: Negative.   Respiratory: Negative for cough, chest tightness, shortness of breath and wheezing.   Cardiovascular: Negative for chest pain, palpitations and leg swelling.  Gastrointestinal: Negative for nausea, abdominal pain, diarrhea, constipation and abdominal distention.  Musculoskeletal: Negative.   Skin: Negative.   Neurological: Negative.       Objective:   Physical Exam  Constitutional: He is oriented to person, place, and time. He appears well-developed and well-nourished.  HENT:  Head: Normocephalic and atraumatic.  Mild redness of the oropharynx  Eyes: EOM are normal.  Neck: Normal range of motion.  Cardiovascular: Normal rate and regular rhythm.   Pulmonary/Chest: Effort normal and breath sounds normal. No respiratory distress. He has no wheezes.  Abdominal: Soft. Bowel sounds are normal. He exhibits no distension. There is no tenderness.  Musculoskeletal: He exhibits no edema.  Neurological: He is alert and oriented to person, place, and time.  Skin: Skin is warm and dry.   Filed Vitals:   02/03/15 1047  BP: 160/82  Pulse: 85    Temp: 98 F (36.7 C)  TempSrc: Oral  Resp: 16  Height: 5\' 8"  (1.727 m)  Weight: 199 lb (90.266 kg)  SpO2: 98%      Assessment & Plan:  Prevnar 13 given at visit.

## 2015-02-06 NOTE — Assessment & Plan Note (Signed)
Rx for azithromycin as he is going to be away from medical advice for several weeks.

## 2015-02-11 ENCOUNTER — Telehealth: Payer: Self-pay | Admitting: *Deleted

## 2015-02-11 NOTE — Telephone Encounter (Signed)
Left message for patient at [phone] (Home #) to check to see how they were doing from their Paronychia procedure that was performed on Wednesday, February 03, 2015. Waiting for a response.

## 2015-03-23 ENCOUNTER — Other Ambulatory Visit: Payer: Self-pay | Admitting: Internal Medicine

## 2015-03-24 NOTE — Telephone Encounter (Signed)
Patient advised of 30 day supply to pharm---patient has scheduled April appt with dr burns to get established

## 2015-03-24 NOTE — Telephone Encounter (Signed)
No, that was a 15 minute acute visit to address concerns he had about his diabetes and labs Dr. Linna Darner had ordered. He still would need to establish with either me or someone else at the office. He did not schedule the follow up as I recommended as no future visits in the system. Not okay to send in 90 day supply in my name. Okay for 30 day only and due for his follow up in April, can make 30 minute transfer if he wants with a provider.

## 2015-03-24 NOTE — Telephone Encounter (Signed)
Is patient's OV in Jan/2017 to establish with you?---ok to send in refills using your name, wellness OV with [facility] in oct/2016----please advise, thanks

## 2015-03-26 ENCOUNTER — Ambulatory Visit (INDEPENDENT_AMBULATORY_CARE_PROVIDER_SITE_OTHER): Payer: Medicare Other | Admitting: Nurse Practitioner

## 2015-03-26 ENCOUNTER — Encounter: Payer: Self-pay | Admitting: Nurse Practitioner

## 2015-03-26 VITALS — BP 148/80 | HR 79 | Temp 97.8°F | Ht 68.0 in | Wt 202.0 lb

## 2015-03-26 DIAGNOSIS — Z7189 Other specified counseling: Secondary | ICD-10-CM | POA: Diagnosis not present

## 2015-03-26 DIAGNOSIS — Z7184 Encounter for health counseling related to travel: Secondary | ICD-10-CM

## 2015-03-26 DIAGNOSIS — H6692 Otitis media, unspecified, left ear: Secondary | ICD-10-CM | POA: Diagnosis not present

## 2015-03-26 MED ORDER — FLUTICASONE PROPIONATE 50 MCG/ACT NA SUSP: 2.0000 | Freq: Every day | NASAL | Status: DC

## 2015-03-26 MED ORDER — AZITHROMYCIN 250 MG PO TABS: ORAL_TABLET | ORAL | Status: DC

## 2015-03-26 NOTE — Patient Instructions (Addendum)
Use the cross over technique for your nasal spray- right hand to left nostril and tilt towards ear THEN same thing on other side.  2 sprays- (don't spray straight up- tilt).   Can try Claritin/Allegra/ or Zyrtec over the counter (pick 1) for allergy symptoms.   Z-pack- reserve for temperature of 100.5 or green mucous   Safe travels!

## 2015-03-26 NOTE — Assessment & Plan Note (Signed)
Resolved Pt still feels "cloudy" feeling in left ear  Flonase sent to pharmacy Instructions on AVS FU prn worsening/failure to improve.

## 2015-03-26 NOTE — Assessment & Plan Note (Signed)
Leaving for another trip in 1 month  Stable at this time Pt took last z-pack Z-pack given to patient with when to take on AVS  FU prn worsening/failure to improve.

## 2015-03-26 NOTE — Progress Notes (Signed)
Patient ID: [name], male    DOB: 1945-06-24  Age: 70 y.o. MRN: [id number]  CC: No chief complaint on file.   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   1) Pt was given Azithromycin since he was going to be away on a cruise. He completed this since he had drainage he reports. Then saw the Doctor on the cruise ship and received some ear solution for a left ear infection presumed. Otomize ear spray given to pt   History [place] has a past medical history of Diabetes mellitus; Hyperlipidemia; Hypertension; Allergic rhinitis; Hypothyroidism; AF (atrial fibrillation) ([place]); CTS (carpal tunnel syndrome); Renal calculus (2005); and Atrial flutter ([place]).   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   His family history includes Allergies in his brother and maternal grandmother; Cancer in his maternal grandmother; Diabetes in his father; Lung cancer in his mother; Stroke in his father; Stroke (age of onset: 23) in his mother; Testicular cancer in his brother. There is no history of Heart attack.He reports that he quit smoking about 41 years ago. His smoking use included Cigarettes. He has a 7.2 pack-year smoking history. He does not have any smokeless tobacco history on file. He reports that he drinks alcohol. He reports that he does not use illicit drugs.  Outpatient Prescriptions Prior to Visit  Medication Sig Dispense Refill  . amLODipine (NORVASC) 10 MG tablet TAKE 1 TABLET DAILY 30 tablet 0  . levothyroxine (SYNTHROID, LEVOTHROID) 25 MCG tablet TAKE ONE  AND ONE-HALF TABLETS BY MOUTH EVERY DAY 135 tablet 1  . Magnesium Oxide 400 MG CAPS Take 1 capsule (400 mg total) by mouth 2 (two) times daily. 180 capsule 3  . metFORMIN (GLUCOPHAGE-XR) 500 MG 24 hr tablet TAKE 2 TABLETS (1000MG )    TWICE A DAY WITH MEALS 120 tablet 0  . metoprolol succinate (TOPROL-XL) 100 MG 24 hr tablet Take one tablet by mouth twice daily and an extra 1/2 tablet by mouth daily as needed for afib 225 tablet 3  . quinapril (ACCUPRIL) 40 MG tablet TAKE 1 TABLET DAILY 30 tablet 0  . repaglinide (PRANDIN) 1 MG tablet Take 1 tablet (1 mg total) by mouth 3 (three) times daily before meals. 270 tablet 1  . rivaroxaban (XARELTO) 20 MG TABS tablet Take 1 tablet (20 mg total) by mouth daily with supper. 90 tablet 3  . traMADol (ULTRAM) 50 MG tablet Take 1 tablet (50 mg total) by mouth every 8 (eight) hours as needed. (Patient not taking: Reported on 02/03/2015) 30 tablet 0  . azithromycin (ZITHROMAX) 250 MG tablet Day 1 take 2 pills, days 2-5 take 1 pill a day. 6 tablet 0   No facility-administered medications prior to visit.    ROS Review of Systems  Constitutional: Negative for fever, chills, diaphoresis and fatigue.  HENT: Positive for ear pain. Negative for congestion, ear discharge, postnasal drip, rhinorrhea, sinus pressure, sneezing and sore throat.   Eyes: Negative for visual disturbance.  Respiratory: Negative for cough.   Gastrointestinal: Negative for nausea, vomiting and diarrhea.  Skin: Negative for rash.  Neurological: Negative for dizziness and headaches.    Objective:  BP 148/80 mmHg  Pulse 79  Temp(Src) 97.8 F (36.6 C) (Oral)  Ht 5\' 8"  (1.727 m)  Wt 202 lb (91.627 kg)  BMI 30.72 kg/m2  SpO2 98%  Physical Exam  Constitutional: He is oriented to person, place, and time. He appears well-developed and well-nourished. No distress.  HENT:  Head: Normocephalic and atraumatic.  Right Ear: External ear normal.  Left Ear: External ear normal.  Mouth/Throat:  No oropharyngeal exudate.  TM's clear bilaterally  Eyes: EOM are normal. Pupils are equal, round, and reactive to light. Right eye exhibits no discharge. Left eye exhibits no discharge. No scleral icterus.  Neck: Normal range of motion. Neck supple.  Cardiovascular: Normal rate, regular rhythm and normal heart sounds.  Exam reveals no gallop and no friction rub.   No murmur heard. Pulmonary/Chest: Effort normal and breath sounds normal. No respiratory distress. He has no wheezes. He has no rales. He exhibits no tenderness.  Lymphadenopathy:    He has no cervical adenopathy.  Neurological: He is alert and oriented to person, place, and time.  Skin: Skin is warm and dry. No rash noted. He is not diaphoretic.  Psychiatric: He has a normal mood and affect. His behavior is normal. Judgment and thought content normal.   Assessment & Plan:   Diagnoses and all orders for this visit:  Travel advice encounter  Left otitis media, recurrence not specified, unspecified chronicity, unspecified otitis media type  Other orders -     Discontinue: azithromycin (ZITHROMAX) 250 MG tablet; Day 1 take 2 pills, days 2-5 take 1 pill a day. -     azithromycin (ZITHROMAX) 250 MG tablet; Day 1 take 2 pills, days 2-5 take 1 pill a day. -     fluticasone (FLONASE) 50 MCG/ACT nasal spray; Place 2 sprays into both nostrils daily.  I am having Mr. Aydelott start on fluticasone. I am also having him maintain his Magnesium Oxide, metoprolol succinate, traMADol, rivaroxaban, repaglinide, levothyroxine, quinapril, amLODipine, metFORMIN, and azithromycin.  Meds ordered this encounter  Medications  . DISCONTD: azithromycin (ZITHROMAX) 250 MG tablet    Sig: Day 1 take 2 pills, days 2-5 take 1 pill a day.    Dispense:  6 tablet    Refill:  0    Order Specific Question:  Supervising Provider    Answer:  Derrel Nip, TERESA L [[id number]]  . azithromycin (ZITHROMAX) 250 MG tablet    Sig: Day 1 take 2 pills, days 2-5 take 1 pill a  day.    Dispense:  6 tablet    Refill:  0    Order Specific Question:  Supervising Provider    Answer:  Derrel Nip, TERESA L [[id number]]  . fluticasone (FLONASE) 50 MCG/ACT nasal spray    Sig: Place 2 sprays into both nostrils daily.    Dispense:  16 g    Refill:  6    Order Specific Question:  Supervising Provider    Answer:  Crecencio Mc [[id number]]     Follow-up: Return if symptoms worsen or fail to improve.

## 2015-04-15 DIAGNOSIS — H4052X1 Glaucoma secondary to other eye disorders, left eye, mild stage: Secondary | ICD-10-CM | POA: Diagnosis not present

## 2015-04-23 ENCOUNTER — Emergency Department (HOSPITAL_COMMUNITY): Payer: Medicare Other

## 2015-04-23 ENCOUNTER — Encounter (HOSPITAL_COMMUNITY): Payer: Self-pay | Admitting: Family Medicine

## 2015-04-23 ENCOUNTER — Other Ambulatory Visit: Payer: Self-pay

## 2015-04-23 ENCOUNTER — Emergency Department (HOSPITAL_COMMUNITY)
Admission: EM | Admit: 2015-04-23 | Discharge: 2015-04-23 | Disposition: A | Discharge status: Home / Self Care | Payer: Medicare Other | Attending: Emergency Medicine | Admitting: Emergency Medicine

## 2015-04-23 DIAGNOSIS — I10 Essential (primary) hypertension: Secondary | ICD-10-CM | POA: Insufficient documentation

## 2015-04-23 DIAGNOSIS — I484 Atypical atrial flutter: Secondary | ICD-10-CM | POA: Diagnosis not present

## 2015-04-23 DIAGNOSIS — E119 Type 2 diabetes mellitus without complications: Secondary | ICD-10-CM | POA: Insufficient documentation

## 2015-04-23 DIAGNOSIS — R000 Tachycardia, unspecified: Secondary | ICD-10-CM | POA: Insufficient documentation

## 2015-04-23 DIAGNOSIS — Z79899 Other long term (current) drug therapy: Secondary | ICD-10-CM | POA: Insufficient documentation

## 2015-04-23 DIAGNOSIS — Z7984 Long term (current) use of oral hypoglycemic drugs: Secondary | ICD-10-CM | POA: Insufficient documentation

## 2015-04-23 DIAGNOSIS — Z87891 Personal history of nicotine dependence: Secondary | ICD-10-CM | POA: Insufficient documentation

## 2015-04-23 DIAGNOSIS — E039 Hypothyroidism, unspecified: Secondary | ICD-10-CM | POA: Insufficient documentation

## 2015-04-23 DIAGNOSIS — Z8669 Personal history of other diseases of the nervous system and sense organs: Secondary | ICD-10-CM | POA: Diagnosis not present

## 2015-04-23 DIAGNOSIS — Z7901 Long term (current) use of anticoagulants: Secondary | ICD-10-CM | POA: Diagnosis not present

## 2015-04-23 DIAGNOSIS — Z87442 Personal history of urinary calculi: Secondary | ICD-10-CM | POA: Diagnosis not present

## 2015-04-23 LAB — BASIC METABOLIC PANEL
Anion gap: 12 (ref 5–15)
BUN: 22 mg/dL — ABNORMAL HIGH (ref 6–20)
CO2: 24 mmol/L (ref 22–32)
Calcium: 10.2 mg/dL (ref 8.9–10.3)
Chloride: 102 mmol/L (ref 101–111)
Creatinine, Ser: 1.21 mg/dL (ref 0.61–1.24)
GFR calc Af Amer: >60 mL/min (ref >60)
GFR calc non Af Amer: 60 mL/min — ABNORMAL LOW (ref >60)
Glucose, Bld: 217 mg/dL — ABNORMAL HIGH (ref 65–99)
Potassium: 5.7 mmol/L — ABNORMAL HIGH (ref 3.5–5.1)
Sodium: 138 mmol/L (ref 135–145)

## 2015-04-23 LAB — CBC
HCT: 50.1 % (ref 39.0–52.0)
Hemoglobin: 16.5 g/dL (ref 13.0–17.0)
MCH: 28.6 pg (ref 26.0–34.0)
MCHC: 32.9 g/dL (ref 30.0–36.0)
MCV: 86.8 fL (ref 78.0–100.0)
Platelets: 240 10*3/uL (ref 150–400)
RBC: 5.77 MIL/uL (ref 4.22–5.81)
RDW: 13.8 % (ref 11.5–15.5)
WBC: 6.4 10*3/uL (ref 4.0–10.5)

## 2015-04-23 LAB — MAGNESIUM: Magnesium: 2 mg/dL (ref 1.7–2.4)

## 2015-04-23 LAB — POTASSIUM: Potassium: 5.8 mmol/L — ABNORMAL HIGH (ref 3.5–5.1)

## 2015-04-23 LAB — TSH: TSH: 4.094 u[IU]/mL (ref 0.350–4.500)

## 2015-04-23 LAB — I-STAT TROPONIN, ED: Troponin i, poc: 0 ng/mL (ref 0.00–0.08)

## 2015-04-23 MED ORDER — FENTANYL CITRATE (PF) 100 MCG/2ML IJ SOLN
100.0000 ug | Freq: Once | INTRAMUSCULAR | Status: AC
  Administered 2015-04-23: 100 ug via INTRAVENOUS
  Filled 2015-04-23: qty 2

## 2015-04-23 MED ORDER — SODIUM BICARBONATE 8.4 % IV SOLN
50.0000 meq | Freq: Once | INTRAVENOUS | Status: AC
  Administered 2015-04-23: 50 meq via INTRAVENOUS
  Filled 2015-04-23: qty 50

## 2015-04-23 MED ORDER — SODIUM CHLORIDE 0.9 % IV BOLUS (SEPSIS)
1000.0000 mL | Freq: Once | INTRAVENOUS | Status: AC
  Administered 2015-04-23: 1000 mL via INTRAVENOUS

## 2015-04-23 MED ORDER — SODIUM CHLORIDE 0.9 % IV SOLN
1.0000 g | Freq: Once | INTRAVENOUS | Status: AC
  Administered 2015-04-23 (×2): 1 g via INTRAVENOUS
  Filled 2015-04-23: qty 10

## 2015-04-23 MED ORDER — SODIUM POLYSTYRENE SULFONATE 15 GM/60ML PO SUSP
30.0000 g | Freq: Once | ORAL | Status: AC
  Administered 2015-04-23: 30 g via ORAL
  Filled 2015-04-23: qty 120

## 2015-04-23 MED ORDER — PROPOFOL 10 MG/ML IV BOLUS
0.5000 mg/kg | Freq: Once | INTRAVENOUS | Status: AC
  Administered 2015-04-23: 60 mg via INTRAVENOUS
  Filled 2015-04-23 (×2): qty 20

## 2015-04-23 NOTE — Consult Note (Signed)
Consult note   Patient ID: [name] MRN: [id number] DOB/AGE: [birth year] 69 y.o.  Admit date: 04/23/2015  Primary Physician   Unice Cobble, MD Primary Cardiologist   Dr. Rayann Heman Reason for Consultation  aflutter Requesting Physician  Dr. Wilson Singer  HPI: [name] is a 70 y.o. male with a history of persistent afib & atypical atrial flutter s/p multiple ablation and cardioversion, HTN, HL, DM and hypothyroidism who came to [facility] ED for evaluation of afib/aflutter.   Last seen by Dr. Rayann Heman 04/27/2014. At time he was in sinus rhythm for almost a year. Stopped Tikosyn 02/2014 likely due to cost. He is s/p atypical atrial flutter ablation by Dr Lehman Prom 06/2013 (last). Cath 05/2003 shoed no significant CAD. Last echo 11/2010 showed normal LV function.   He had DCCV x 2 since last seen by Dr. Rayann Heman. First episode occurs 08/2014 in [place] and 2nd episode 11/2014 [place]. Both time requiring DCCV. IV Cardizem did not worked. The patient has Medtronic ILR since 12/2010--> last check 04/27/2014.   Last night he suddenly became an anxious and checked his pulse and it was elevated. Onset of symptoms around 8pm 04/22/15. Due to ongoing rapid rate he presented to ED for further evaluation. The patient denies nausea, vomiting, fever, chest pain, palpitations, shortness of breath, orthopnea, PND, dizziness, syncope, cough, congestion, abdominal pain, hematochezia, melena, lower extremity edema. The patient does not exercise regularly--> intermittently he gets sob and attributes that to out of shape. Compliant with medications. Denies hx of tobacco abuse or illicit drug use.   EKG showed 2:1 aflutter at rate of 124bpm, incomplete RBBB, non specific ST or T abnormality--> likely rate related. K of 5.7-->5.8. Mg of 2. Given bicarb and Kayexalate. POC troponin negative. CXR clear. Blood glucose of 217.     Past Medical History  Diagnosis Date  . Diabetes mellitus     type 2  . Hyperlipidemia   .  Hypertension   . Allergic rhinitis     seasonal  . Hypothyroidism   . AF (atrial fibrillation) (HCC)     persistent afib, s/p afib ablation x 2 with subsequent convergent procedure at [facility]  . CTS (carpal tunnel syndrome)     PMH of  . Renal calculus 2005    [facility]  . Atrial flutter [facility])      Past Surgical History  Procedure Laterality Date  . Cardioversion    . Afib 09/22/2008--11/03/2009      afib ablation x 2 by Dr Rayann Heman with subsequent convergent procedure at [facility]  . Total hip arthroplasty  06/2010    Depuy prosthesis replacement, Dr Alvan Dame  . Cardioversion  12/09/2010    Procedure: CARDIOVERSION;  Surgeon: Loralie Champagne, MD;  Location: [place];  Service: Cardiovascular;  Laterality: N/A;  . Total hip arthroplasty Right 2008     Depuy;Dr Alvan Dame  . Tonsillectomy    . Cardioversion  09/15/2011    Procedure: CARDIOVERSION;  Surgeon: Josue Hector, MD;  Location: [place];  Service: Cardiovascular;  Laterality: N/A;  office drawing labs wed 8/28  . Loop recorder implant Left 12-2010  . Cardiac catheterization  2005    non obstructive CAD  . Convergent procedure  2013    at [facility] for a fib  . Cardioversion N/A 07/18/2012    Procedure: CARDIOVERSION;  Surgeon: Thayer Headings, MD;  Location: [place];  Service: Cardiovascular;  Laterality: N/A;  . Joint replacement Right 2008, 2012  . Ablation  06/2013  atypical atrial flutter ablation by Dr Lehman Prom at [facility] (mitral annular flutter ablated)  . Colonoscopy  2009    Dr Sharlett Iles  . Cardioversion  08/2014    [place]    No Known Allergies  I have reviewed the patient's current medications . sodium bicarbonate  50 mEq Intravenous Once  . sodium polystyrene  30 g Oral Once   . calcium gluconate 1 GM IV       Prior to Admission medications   Medication Sig Start Date End Date Taking? Authorizing Provider  amLODipine (NORVASC) 10 MG tablet TAKE 1 TABLET DAILY 03/24/15  Yes Hoyt Koch, MD  levothyroxine  (SYNTHROID, LEVOTHROID) 25 MCG tablet TAKE ONE AND ONE-HALF TABLETS BY MOUTH EVERY DAY 11/20/14  Yes Hendricks Limes, MD  Magnesium Oxide 400 MG CAPS Take 1 capsule (400 mg total) by mouth 2 (two) times daily. Patient taking differently: Take 500 mg by mouth 2 (two) times daily.  10/29/12  Yes Thompson Grayer, MD  metFORMIN (GLUCOPHAGE-XR) 500 MG 24 hr tablet TAKE 2 TABLETS (1000MG )    TWICE A DAY WITH MEALS 03/24/15  Yes Hoyt Koch, MD  metoprolol succinate (TOPROL-XL) 100 MG 24 hr tablet Take one tablet by mouth twice daily and an extra 1/2 tablet by mouth daily as needed for afib 04/27/14  Yes Thompson Grayer, MD  quinapril (ACCUPRIL) 40 MG tablet TAKE 1 TABLET DAILY 03/24/15   Hoyt Koch, MD  repaglinide (PRANDIN) 1 MG tablet Take 1 tablet (1 mg total) by mouth 3 (three) times daily before meals. 10/23/14   Hendricks Limes, MD  rivaroxaban (XARELTO) 20 MG TABS tablet Take 1 tablet (20 mg total) by mouth daily with supper. 10/19/14   Sherran Needs, NP  traMADol (ULTRAM) 50 MG tablet Take 1 tablet (50 mg total) by mouth every 8 (eight) hours as needed. Patient not taking: Reported on 02/03/2015 05/12/14   Hendricks Limes, MD     Social History   Social History  . Marital Status: Married    Spouse Name: Izora Gala  . Number of Children: Y  . Years of Education: N/A   Occupational History  . BUYER    Social History Main Topics  . Smoking status: Former Smoker -- 0.90 packs/day for 8 years    Types: Cigarettes    Quit date: 01/16/1974  . Smokeless tobacco: Not on file     Comment: smoked  1966- 1976,up to 1 ppd  . Alcohol Use: 0.0 oz/week    0 Standard drinks or equivalent per week     Comment: Socially; "sips socially"( essentially none)  . Drug Use: No  . Sexual Activity: Not on file   Other Topics Concern  . Not on file   Social History Narrative    No family status information on file.   Family History  Problem Relation Age of Onset  . Lung cancer Mother     non  smoker  . Stroke Mother 60  . Diabetes Father   . Stroke Father     mid 63s  . Testicular cancer Brother   . Allergies Maternal Grandmother   . Cancer Maternal Grandmother     unsure  . Allergies Brother   . Heart attack Neg Hx      ROS:  Full 14 point review of systems complete and found to be negative unless listed above.  Physical Exam: Blood pressure 119/52, pulse 88, temperature 98.1 F (36.7 C), temperature source Oral, resp. rate  18, SpO2 98 %.  General: Well developed, well nourished, male in no acute distress Head: Eyes PERRLA, No xanthomas. Normocephalic and atraumatic, oropharynx without edema or exudate.  Lungs: Resp regular and unlabored, CTA. Heart: RRR no s3, s4, or murmurs. Neck: No carotid bruits. No lymphadenopathy.  No JVD. Abdomen: Bowel sounds present, abdomen soft and non-tender without masses or hernias noted. Msk:  No spine or cva tenderness. No weakness, no joint deformities or effusions. Extremities: No clubbing, cyanosis or edema. DP/PT/Radials 2+ and equal bilaterally. Neuro: Alert and oriented X 3. No focal deficits noted. Psych:  Good affect, responds appropriately Skin: No rashes or lesions noted.  Labs:   Lab Results  Component Value Date   WBC 6.4 04/23/2015   HGB 16.5 04/23/2015   HCT 50.1 04/23/2015   MCV 86.8 04/23/2015   PLT 240 04/23/2015   No results for input(s): INR in the last 72 hours.  Recent Labs Lab 04/23/15 1050 04/23/15 1227  NA 138  --   K 5.7* 5.8*  CL 102  --   CO2 24  --   BUN 22*  --   CREATININE 1.21  --   CALCIUM 10.2  --   GLUCOSE 217*  --    MAGNESIUM  Date Value Ref Range Status  04/23/2015 2.0 1.7 - 2.4 mg/dL Final   No results for input(s): CKTOTAL, CKMB, TROPONINI in the last 72 hours.  Recent Labs  04/23/15 1059  TROPIPOC 0.00   No results found for: PROBNP Lab Results  Component Value Date   CHOL 147 05/12/2014   HDL 43.90 05/12/2014   LDLCALC 68 05/12/2014   TRIG 174.0* 05/12/2014    Echo: 11/2010 LV EF: 60% -  65%  ------------------------------------------------------------ Indications:   Atrial fibrillation - 427.31.  ------------------------------------------------------------ History:  PMH: Acquired from the patient and from the patient's chart. Atrial fibrillation. Risk factors: OSA. Hypertension. Dyslipidemia.  ------------------------------------------------------------ Study Conclusions  - Left ventricle: The cavity size was normal. Systolic function was normal. The estimated ejection fraction was in the range of 60% to 65%. Wall motion was normal; there were no regional wall motion abnormalities. - Left atrium: The atrium was moderately dilated. - Atrial septum: No defect or patent foramen ovale was identified.  CATHETERIZATION RESULTS: 05/2003  HEMODYNAMICS: 1. Left ventricular pressure 160/29. 2. Aortic pressure 164/92. 3. There is no aortic valve gradient.  LEFT VENTRICULOGRAM: Wall motion is normal. Ejection fraction is estimated at 55-60%. There is no mitral regurgitation.  CORONARY ARTERIOGRAPHY (RIGHT DOMINANT): Left main is normal.  Left anterior descending artery gives rise to a small first diagonal and a normal size second diagonal. The LAD is normal.  Left circumflex gives rise to a small bifurcating ramus intermedius, normal size first obtuse marginal branch and a normal size second obtuse marginal branch. There is a 20% stenosis in the distal left circumflex. Otherwise, this vessel is normal.  Right coronary artery is a dominant vessel. It gives rise to a normal size posterior descending artery, small first posterior lateral branch and a small second posterior lateral branch.  IMPRESSION: 1. Normal left ventricular systolic function with an elevated end-diastolic  pressure consistent with possible diastolic dysfunction. 2. No significant coronary artery disease  identified.  ECG:  EKG showed 2:1 aflutter at rate of 124bpm, incomplete RBBB, non specific ST or T abnormality. Vent. rate 124 BPM PR interval 112 ms QRS duration 116 ms QT/QTc 326/468 ms P-R-T axes 59 19 -11  Radiology:  Dg Chest 2 View  04/23/2015  CLINICAL DATA:  70 year old male with tachycardia for 1 day. Initial encounter. EXAM: CHEST  2 VIEW COMPARISON:  12/16/2009 and earlier. FINDINGS: Cardiac event recorder now in place. Lung volumes remain normal. Normal cardiac size and mediastinal contours. Visualized tracheal air column is within normal limits. No pneumothorax, pulmonary edema, pleural effusion or confluent pulmonary opacity. No acute osseous abnormality identified. IMPRESSION: No acute cardiopulmonary abnormality. Electronically Signed   By: Genevie Ann M.D.   On: 04/23/2015 11:43    ASSESSMENT AND PLAN:     1. Aflutter with RVR - EKG with 2:1 at rate of 124bpm. Onset of symptoms 8pm 04/22/15. Hx of multiple cardioversion and ablation. Stopped Tikosyn 02/2014 likely due to cost. He is s/p atypical atrial flutter ablation by Dr Lehman Prom 06/2013 (last). Cath 05/2003 shoed no significant CAD. Last echo 11/2010 showed normal LV function. Last DCCV 11/2014 @ [place].  - The patient has Medtronic ILD. Emberlee Sortino get device interrogation.  - CHADSVASc score of 3 (age, HTN, DM). Continue Xarelto for anticoagulation.    2. Hyperkalemia - Given bicarb and Kayexalate.  Dispo: The patient seen by Dr. Curt Bears. Plan for DCCV in ED and F/u with Dr. Lehman Prom.   SignedLeanor Kail, PA 04/23/2015, 1:30 PM Pager [phone]  I have seen and examined this patient with [facility].  Agree with above, note added to reflect my findings.  On exam, tachycardic, irregular, no murmurs, lungs clear.  Went into atrial flutter this AM.  Has had multiple ablations in the past.  Patient does not wish to be admitted to the hospital for drug loading.  Rusty Villella plan for cardioversion in the ER.  Patient Deolinda Frid call  his EP Dr. Lehman Prom for follow up appointment.  He has been compliant with his Xarelto.    Alma Mohiuddin M. Lorren Rossetti MD 04/23/2015 2:45 PM

## 2015-04-23 NOTE — ED Notes (Signed)
Pt signed consent for procedure and placed at the bedside.

## 2015-04-23 NOTE — ED Notes (Signed)
Pt verbalized understanding of d/c instructions and has no further questions. Pt stable and NAD. Aldrete score normal. Pt alert and oriented x 4 and NSR.

## 2015-04-23 NOTE — ED Notes (Signed)
Pt here for afib/flutter with rapid HB. sts hx of the same. HR 124. sts started last night and he didn't fell right when he woke up this am. sts took some metoprolol this am. sts took 100 at 9 am. Denies chest pain, SOB.

## 2015-04-23 NOTE — Sedation Documentation (Signed)
Vital signs stable. 

## 2015-04-23 NOTE — Sedation Documentation (Signed)
150J syncronized cardioversion administered. Pt tolerated well and now back in NSR.

## 2015-04-23 NOTE — ED Notes (Signed)
Wilson Singer, MD and Humberto Seals, RN at bedside speaking with patient

## 2015-04-23 NOTE — ED Provider Notes (Signed)
CSN: [id number]     Arrival date & time 04/23/15  1038 History   First MD Initiated Contact with Patient 04/23/15 1128     Chief Complaint  Patient presents with  . Atrial Fibrillation     (Consider location/radiation/quality/duration/timing/severity/associated sxs/prior Treatment) HPI   [name] is a 70 y.o. male, with a history of DM, A. fib, hypertension, and a flutter, presenting to the ED with rapid heart rate that began last night. Pt states he began to feel anxious and took his pulse and it was rapid. Pt states was seen for rapid heart rate that turned out to be Aflutter or Afib twice last year. Pt states he has had to be cardioverted both times. Patient's cardiologist is Dr. Rayann Heman with the last visit to him about a year ago. Pt has had multiple ablations and about 16 cardioversions in the last few years. Pt states past attempts at using cardizem for this problem have failed. Pt has an implanted heart monitor. Pt denies chest pain, shortness of breath, diaphoresis, dizziness, N/V, or any other complaints. Pt is currently anticoagulated with Xarelto.    Past Medical History  Diagnosis Date  . Diabetes mellitus     type 2  . Hyperlipidemia   . Hypertension   . Allergic rhinitis     seasonal  . Hypothyroidism   . AF (atrial fibrillation) (HCC)     persistent afib, s/p afib ablation x 2 with subsequent convergent procedure at [facility]  . CTS (carpal tunnel syndrome)     PMH of  . Renal calculus 2005    [facility]  . Atrial flutter [facility])    Past Surgical History  Procedure Laterality Date  . Cardioversion    . Afib 09/22/2008--11/03/2009      afib ablation x 2 by Dr Rayann Heman with subsequent convergent procedure at [facility]  . Total hip arthroplasty  06/2010    Depuy prosthesis replacement, Dr Alvan Dame  . Cardioversion  12/09/2010    Procedure: CARDIOVERSION;  Surgeon: Loralie Champagne, MD;  Location: [place];  Service: Cardiovascular;  Laterality: N/A;  . Total hip arthroplasty  Right 2008     Depuy;Dr Alvan Dame  . Tonsillectomy    . Cardioversion  09/15/2011    Procedure: CARDIOVERSION;  Surgeon: Josue Hector, MD;  Location: [place];  Service: Cardiovascular;  Laterality: N/A;  office drawing labs wed 8/28  . Loop recorder implant Left 12-2010  . Cardiac catheterization  2005    non obstructive CAD  . Convergent procedure  2013    at [facility] for a fib  . Cardioversion N/A 07/18/2012    Procedure: CARDIOVERSION;  Surgeon: Thayer Headings, MD;  Location: [place];  Service: Cardiovascular;  Laterality: N/A;  . Joint replacement Right 2008, 2012  . Ablation  06/2013    atypical atrial flutter ablation by Dr Lehman Prom at [facility] (mitral annular flutter ablated)  . Colonoscopy  2009    Dr Sharlett Iles  . Cardioversion  08/2014    [place], [place]   Family History  Problem Relation Age of Onset  . Lung cancer Mother     non smoker  . Stroke Mother 78  . Diabetes Father   . Stroke Father     mid 91s  . Testicular cancer Brother   . Allergies Maternal Grandmother   . Cancer Maternal Grandmother     unsure  . Allergies Brother   . Heart attack Neg Hx    Social History  Substance Use Topics  .  Smoking status: Former Smoker -- 0.90 packs/day for 8 years    Types: Cigarettes    Quit date: 01/16/1974  . Smokeless tobacco: None     Comment: smoked  1966- 1976,up to 1 ppd  . Alcohol Use: 0.0 oz/week    0 Standard drinks or equivalent per week     Comment: Socially; "sips socially"( essentially none)    Review of Systems  Constitutional: Negative for fever, chills and diaphoresis.  Respiratory: Negative for shortness of breath.   Cardiovascular: Negative for chest pain.       Rapid heart rate  Gastrointestinal: Negative for nausea and vomiting.  Neurological: Negative for dizziness, weakness, light-headedness and headaches.  All other systems reviewed and are negative.     Allergies  Review of patient's allergies indicates no known allergies.  Home Medications    Prior to Admission medications   Medication Sig Start Date End Date Taking? Authorizing Provider  amLODipine (NORVASC) 10 MG tablet TAKE 1 TABLET DAILY 03/24/15  Yes Hoyt Koch, MD  latanoprost (XALATAN) 0.005 % ophthalmic solution Place 1 drop into the left eye at bedtime.   Yes Historical Provider, MD  levothyroxine (SYNTHROID, LEVOTHROID) 25 MCG tablet TAKE ONE AND ONE-HALF TABLETS BY MOUTH EVERY DAY 11/20/14  Yes Hendricks Limes, MD  Magnesium Oxide 400 MG CAPS Take 1 capsule (400 mg total) by mouth 2 (two) times daily. Patient taking differently: Take 500 mg by mouth 2 (two) times daily.  10/29/12  Yes Thompson Grayer, MD  metFORMIN (GLUCOPHAGE-XR) 500 MG 24 hr tablet TAKE 2 TABLETS (1000MG )    TWICE A DAY WITH MEALS 03/24/15  Yes Hoyt Koch, MD  metoprolol succinate (TOPROL-XL) 100 MG 24 hr tablet Take one tablet by mouth twice daily and an extra 1/2 tablet by mouth daily as needed for afib 04/27/14  Yes Thompson Grayer, MD  Multiple Vitamin (MULTIVITAMIN) tablet Take 1 tablet by mouth daily.   Yes Historical Provider, MD  quinapril (ACCUPRIL) 40 MG tablet TAKE 1 TABLET DAILY 03/24/15  Yes Hoyt Koch, MD  repaglinide (PRANDIN) 1 MG tablet Take 1 tablet (1 mg total) by mouth 3 (three) times daily before meals. 10/23/14  Yes Hendricks Limes, MD  rivaroxaban (XARELTO) 20 MG TABS tablet Take 1 tablet (20 mg total) by mouth daily with supper. 10/19/14  Yes Sherran Needs, NP   BP 126/70 mmHg  Pulse 74  Temp(Src) 98.1 F (36.7 C) (Oral)  Resp 15  Wt 91.6 kg  SpO2 100% Physical Exam  Constitutional: He appears well-developed and well-nourished. No distress.  HENT:  Head: Normocephalic and atraumatic.  Eyes: Conjunctivae are normal. Pupils are equal, round, and reactive to light.  Neck: Neck supple.  Cardiovascular: Regular rhythm, normal heart sounds and intact distal pulses.  Tachycardia present.   Pulmonary/Chest: Effort normal and breath sounds normal. No  respiratory distress.  Abdominal: Soft. There is no tenderness. There is no guarding.  Musculoskeletal: He exhibits no edema or tenderness.  Lymphadenopathy:    He has no cervical adenopathy.  Neurological: He is alert.  Skin: Skin is warm and dry. He is not diaphoretic.  Psychiatric: He has a normal mood and affect. His behavior is normal.  Nursing note and vitals reviewed.   ED Course  Procedures (including critical care time) Labs Review Labs Reviewed  BASIC METABOLIC PANEL - Abnormal; Notable for the following:    Potassium 5.7 (*)    Glucose, Bld 217 (*)    BUN 22 (*)  GFR calc non Af Amer 60 (*)    All other components within normal limits  POTASSIUM - Abnormal; Notable for the following:    Potassium 5.8 (*)    All other components within normal limits  CBC  MAGNESIUM  TSH  I-STAT TROPOININ, ED    Imaging Review Dg Chest 2 View  04/23/2015  CLINICAL DATA:  70 year old male with tachycardia for 1 day. Initial encounter. EXAM: CHEST  2 VIEW COMPARISON:  12/16/2009 and earlier. FINDINGS: Cardiac event recorder now in place. Lung volumes remain normal. Normal cardiac size and mediastinal contours. Visualized tracheal air column is within normal limits. No pneumothorax, pulmonary edema, pleural effusion or confluent pulmonary opacity. No acute osseous abnormality identified. IMPRESSION: No acute cardiopulmonary abnormality. Electronically Signed   By: Genevie Ann M.D.   On: 04/23/2015 11:43   I have personally reviewed and evaluated these images and lab results as part of my medical decision-making.   EKG Interpretation   Date/Time:  Friday April 23 2015 10:43:00 EDT Ventricular Rate:  124 PR Interval:  112 QRS Duration: 116 QT Interval:  326 QTC Calculation: 468 R Axis:   19 Text Interpretation:  Sinus tachycardia Incomplete right bundle branch  block Nonspecific ST and T wave abnormality Abnormal ECG Confirmed by  Wilson Singer  MD, STEPHEN ([id number]) on 04/23/2015 11:01:13 AM        Medications  sodium polystyrene (KAYEXALATE) 15 GM/60ML suspension 30 g (30 g Oral Given 04/23/15 1332)  calcium gluconate 1 g in sodium chloride 0.9 % 100 mL IVPB (0 g Intravenous Stopped 04/23/15 1538)  sodium bicarbonate injection 50 mEq (50 mEq Intravenous Given 04/23/15 1335)  propofol (DIPRIVAN) 10 mg/mL bolus/IV push 45.8 mg (0 mg/kg  91.6 kg Intravenous Stopped 04/23/15 1629)  fentaNYL (SUBLIMAZE) injection 100 mcg (100 mcg Intravenous Given 04/23/15 1623)  sodium chloride 0.9 % bolus 1,000 mL (0 mLs Intravenous Stopped 04/23/15 1709)    MDM   Final diagnoses:  Tachycardia    Venia Carbon Mccormac presents with rapid heart rate that started last night.  Findings and plan of care discussed with Virgel Manifold, MD. Dr. Wilson Singer personally evaluated and examined this patient.  This patient's presentation is consistent with a stable sinus tach or stable A flutter. Patient's heart rate is steady around 125. He is nontoxic appearing, normotensive, maintains SPO2 of 98% on room air, is not tachypneic, and is in no apparent distress. Patient's hyperkalemia is noted. Patient has no EKG changes and his overall asymptomatic to this lab finding. Potassium was repeated and found to still be high. Kayexalate, calcium, and bicarbonate ordered. 12:11 PM Spoke with Wannetta Sender, from cardiology, who agreed to come see the patient. No instructions. Upon reassessment, patient states that he feels normal. Heart rate is now between 88 and 96. Shortly thereafter, patient's heart rate returned to between 120 and 125. 4:38 PM Patient successfully cardioverted using procedural sedation by Dr. Wilson Singer. Pt observed following the procedure until sedation wore off. Pt denies any complaints. Follow-up EKG shows sinus rhythm at a rate of 72. Follow-up and return precautions discussed. Patient voiced understanding of these instructions and is comfortable with discharge. Patient appears safe for discharge at this time.   Filed Vitals:    04/23/15 1700 04/23/15 1705 04/23/15 1710 04/23/15 1715  BP: 132/71 124/68 129/69 126/70  Pulse: 75 76 76 74  Temp:    98.1 F (36.7 C)  TempSrc:    Oral  Resp: 13 15 19 15   Weight:  SpO2: 96% 100% 100% 100%       Lorayne Bender, PA-C 04/23/15 1730  Virgel Manifold, MD 04/26/15 1401

## 2015-04-23 NOTE — Sedation Documentation (Signed)
Pt alert and oriented x 4, no complaints of pain.

## 2015-04-23 NOTE — ED Notes (Signed)
Patient transported to X-ray 

## 2015-04-23 NOTE — ED Provider Notes (Signed)
Medical screening examination/treatment/procedure(s) were conducted as a shared visit with non-physician practitioner(s) and myself.  I personally evaluated the patient during the encounter.   EKG Interpretation   Date/Time:  Friday April 23 2015 10:43:00 EDT Ventricular Rate:  124 PR Interval:  112 QRS Duration: 116 QT Interval:  326 QTC Calculation: 468 R Axis:   19 Text Interpretation:  Sinus tachycardia Incomplete right bundle branch  block Nonspecific ST and T wave abnormality Abnormal ECG Confirmed by  Brion Hedges  MD, Natina Wiginton ([id number]) on 04/23/2015 11:01:13 AM     69yM with palpitations. I initially read EKG at sinus tach. After evaluating him though and looking through some records, I'm not convinced. Hx of aflutter. Current symptoms feel similar to previous ones. No obvious reason for him to suddenly have sinus tachycardia in 120s. No variation at all in rate while I was in room. Previous ablation and cardioversion. He is pretty sure of symptom onset some time last night. On xarelto. Given his history though, would appreciate cardiology input.   Seen by cardiology. Recommending cardioversion. Done so with conversion to sinus. No further complaints. Outpt cardiology FU.  .Cardioversion Date/Time: 04/23/2015 2:30 PM Performed by: Virgel Manifold Authorized by: Virgel Manifold Consent: Verbal consent obtained. Written consent obtained. Risks and benefits: risks, benefits and alternatives were discussed Consent given by: patient Required items: required blood products, implants, devices, and special equipment available Patient identity confirmed: verbally with patient and provided demographic data Time out: Immediately prior to procedure a "time out" was called to verify the correct patient, procedure, equipment, support staff and site/side marked as required. Patient sedated: yes Sedation type: moderate (conscious) sedation Sedatives: propofol and fentanyl Analgesia: fentanyl Cardioversion  basis: elective Indications: failure of anti-arrhythmic medications Pre-procedure rhythm: atrial flutter Patient position: patient was placed in a supine position Chest area: chest area exposed Electrodes: pads Electrodes placed: anterior-posterior Number of attempts: 1 Attempt 1 mode: synchronous Attempt 1 waveform: biphasic Attempt 1 shock (in Joules): 150 Attempt 1 outcome: conversion to normal sinus rhythm Post-procedure rhythm: normal sinus rhythm Complications: no complications Patient tolerance: Patient tolerated the procedure well with no immediate complications  Procedural Sedation: Performed by: Virgel Manifold Authorized by: Virgel Manifold  Preprocedure  Pre-anesthesia/induction confirmation of laterality/correct procedure site including "time-out."  Provider confirms review of the nurses' note, allergies, medications, pertinent labs, PMH, pre-induction vital signs, pulse oximetry, pain level, and ECG (as applicable), and patient condition satisfactory for commencing with order for sedation and procedure. Pt has had numerous previous cardioversions and sedation and report and no significant complications.   Medications (please refer to [facility] for dosing/times) : Fentanyl (analgesia/sedation)                   Propofol  Patient tolerated procedure and procedural sedation component as expected without apparent immediate complications.  Physician confirms procedural medication orders as administered, patient was assessed by physician post-procedure, and confirms post-sedation plan of care and disposition.  Total time of sedation/monitoring : 35 minutes     Virgel Manifold, MD 04/26/15 1401

## 2015-04-23 NOTE — Discharge Instructions (Signed)
You have been seen today for a high heart rate. You were successfully cardioverted in the ED today. Be sure to follow up with your cardiologist as soon as possible. Follow up with PCP as needed. Return to ED should symptoms worsen.

## 2015-04-26 ENCOUNTER — Telehealth: Payer: Self-pay | Admitting: *Deleted

## 2015-04-26 NOTE — Telephone Encounter (Signed)
Pt has called and made appt to see Dr. Quay Burow on 05/05/15...[name]

## 2015-04-26 NOTE — Telephone Encounter (Signed)
I may have sent this one already, hospital follow up.        Thanks,    Almyra Free     ----- Message -----     From: Hendricks Limes, MD     Sent: 04/24/2015  8:59 AM      To: Mickie Kay        Looks like he is seeing Dr Creola Corn    ----- Message -----     From: SYSTEM     Sent: 04/23/2015  5:33 PM      To: Hendricks Limes, MD

## 2015-05-03 ENCOUNTER — Other Ambulatory Visit: Payer: Self-pay

## 2015-05-03 DIAGNOSIS — E039 Hypothyroidism, unspecified: Secondary | ICD-10-CM

## 2015-05-03 MED ORDER — LEVOTHYROXINE SODIUM 25 MCG PO TABS: ORAL_TABLET | ORAL | Status: DC

## 2015-05-04 ENCOUNTER — Other Ambulatory Visit: Payer: Self-pay | Admitting: Internal Medicine

## 2015-05-05 ENCOUNTER — Ambulatory Visit (INDEPENDENT_AMBULATORY_CARE_PROVIDER_SITE_OTHER): Payer: Medicare Other | Admitting: Internal Medicine

## 2015-05-05 ENCOUNTER — Encounter: Payer: Self-pay | Admitting: Internal Medicine

## 2015-05-05 ENCOUNTER — Other Ambulatory Visit (INDEPENDENT_AMBULATORY_CARE_PROVIDER_SITE_OTHER): Payer: Medicare Other

## 2015-05-05 ENCOUNTER — Telehealth: Payer: Self-pay | Admitting: Internal Medicine

## 2015-05-05 VITALS — BP 146/88 | HR 72 | Temp 97.9°F | Resp 16 | Wt 200.0 lb

## 2015-05-05 DIAGNOSIS — E039 Hypothyroidism, unspecified: Secondary | ICD-10-CM

## 2015-05-05 DIAGNOSIS — I10 Essential (primary) hypertension: Secondary | ICD-10-CM

## 2015-05-05 DIAGNOSIS — E1151 Type 2 diabetes mellitus with diabetic peripheral angiopathy without gangrene: Secondary | ICD-10-CM

## 2015-05-05 DIAGNOSIS — I4819 Other persistent atrial fibrillation: Secondary | ICD-10-CM

## 2015-05-05 DIAGNOSIS — E785 Hyperlipidemia, unspecified: Secondary | ICD-10-CM

## 2015-05-05 DIAGNOSIS — I481 Persistent atrial fibrillation: Secondary | ICD-10-CM

## 2015-05-05 DIAGNOSIS — M5416 Radiculopathy, lumbar region: Secondary | ICD-10-CM

## 2015-05-05 LAB — COMPREHENSIVE METABOLIC PANEL
ALT: 30 U/L (ref 0–53)
AST: 23 U/L (ref 0–37)
Albumin: 4.8 g/dL (ref 3.5–5.2)
Alkaline Phosphatase: 45 U/L (ref 39–117)
BUN: 18 mg/dL (ref 6–23)
CO2: 28 mEq/L (ref 19–32)
Calcium: 10.6 mg/dL — ABNORMAL HIGH (ref 8.4–10.5)
Chloride: 102 mEq/L (ref 96–112)
Creatinine, Ser: 0.97 mg/dL (ref 0.40–1.50)
GFR: 81.67 mL/min (ref >60.00)
Glucose, Bld: 148 mg/dL — ABNORMAL HIGH (ref 70–99)
Potassium: 5.3 mEq/L — ABNORMAL HIGH (ref 3.5–5.1)
Sodium: 138 mEq/L (ref 135–145)
Total Bilirubin: 0.6 mg/dL (ref 0.2–1.2)
Total Protein: 8.2 g/dL (ref 6.0–8.3)

## 2015-05-05 LAB — HEMOGLOBIN A1C: Hgb A1c MFr Bld: 8 % — ABNORMAL HIGH (ref 4.6–6.5)

## 2015-05-05 LAB — TSH: TSH: 4.47 u[IU]/mL (ref 0.35–4.50)

## 2015-05-05 MED ORDER — QUINAPRIL HCL 40 MG PO TABS: 40.0000 mg | ORAL_TABLET | Freq: Every day | ORAL | Status: DC

## 2015-05-05 MED ORDER — METFORMIN HCL ER 500 MG PO TB24: ORAL_TABLET | ORAL | Status: DC

## 2015-05-05 MED ORDER — LEVOTHYROXINE SODIUM 25 MCG PO TABS: ORAL_TABLET | ORAL | Status: DC

## 2015-05-05 MED ORDER — REPAGLINIDE 1 MG PO TABS: 1.0000 mg | ORAL_TABLET | Freq: Three times a day (TID) | ORAL | Status: DC

## 2015-05-05 MED ORDER — GABAPENTIN 100 MG PO CAPS: ORAL_CAPSULE | ORAL | Status: DC

## 2015-05-05 MED ORDER — HYDROCODONE-ACETAMINOPHEN 5-325 MG PO TABS: 1.0000 | ORAL_TABLET | Freq: Four times a day (QID) | ORAL | Status: DC | PRN

## 2015-05-05 MED ORDER — AMLODIPINE BESYLATE 10 MG PO TABS: 10.0000 mg | ORAL_TABLET | Freq: Every day | ORAL | Status: DC

## 2015-05-05 MED ORDER — METOPROLOL SUCCINATE ER 100 MG PO TB24: ORAL_TABLET | ORAL | Status: DC

## 2015-05-05 NOTE — Assessment & Plan Note (Signed)
Sugars have been a little elevated, better in the past week Check a1c Stressed importance of increasing exercise Work on weight loss Can improve diet

## 2015-05-05 NOTE — Assessment & Plan Note (Signed)
Recent cardioversion Many cardioversions, ablations in the past Currently in sinus Will f/u with cardio

## 2015-05-05 NOTE — Progress Notes (Signed)
Subjective:    Patient ID: [name], male    DOB: August 23, 1945, 70 y.o.   MRN: [id number]  HPI He is here to establish with a new pcp.    Diabetes: He is taking his medication daily as prescribed. He is fairly compliant with a diabetic diet. He is not exercising regularly - he is better in the summer. He monitors his sugars and they have been running 121-189. He checks his feet daily and denies foot lesions. He sees a podiatrist.  He is up-to-date with an ophthalmology examination.   Left ear:  He feels a little "cloudiness" in his left ear, but no pain.  He had an ear infection here previously and would like to have it checked to make sure it looks okay.  Back pain:  He had back pain one year ago and did PT and it went away.  He is going out of the country this Saturday.  He has taken some aleve.  The pain is in the lower back in the center and radiates down the left leg.  He denies numbness/tingling. He denies weakness in the legs.  No history of injections.  His pain is a 7-8 with certain positions.    Afib/Aflutter: he was in the ED last week for Afib/Aflutter.  This is an ongoing issue and follows with Dr. Rayann Heman.  He has had over one dozen cardioversions, has had ablations and has been on many different medications.  He had a cardioversion last week and has been in sinus since then.  He denies palpitations, chest pain, sob and lightheadedness.  He will follow up with his cardiologist.   Medications and allergies reviewed with patient and updated if appropriate.  Patient Active Problem List   Diagnosis Date Noted  . Left otitis media 03/26/2015  . Travel advice encounter 02/06/2015  . Hypercalcemia 03/23/2014  . Hypothyroidism 12/30/2010  . OSA (obstructive sleep apnea) 09/02/2010  . Atrial fibrillation ([place]) 04/05/2009  . EDEMA 04/05/2009  . ERECTILE DYSFUNCTION 05/01/2008  . Diabetes mellitus with peripheral circulatory disorder, controlled ([place]) 09/06/2007  .  Hyperlipidemia 09/06/2007  . Essential hypertension 09/06/2007  . 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 amLODipine (NORVASC) 10 MG tablet TAKE 1 TABLET DAILY 30 tablet 0  . latanoprost (XALATAN) 0.005 % ophthalmic solution Place 1 drop into the left eye at bedtime.    Marland Kitchen levothyroxine (SYNTHROID, LEVOTHROID) 25 MCG tablet TAKE ONE AND ONE-HALF TABLETS BY MOUTH EVERY DAY 135 tablet 1  . Magnesium Oxide 400 MG CAPS Take 1 capsule (400 mg total) by mouth 2 (two) times daily. (Patient taking differently: Take 500 mg by mouth 2 (two) times daily. ) 180 capsule 3  . metFORMIN (GLUCOPHAGE-XR) 500 MG 24 hr tablet TAKE 2 TABLETS (1000MG )    TWICE A DAY WITH MEALS 120 tablet 0  . metoprolol succinate (TOPROL-XL) 100 MG 24 hr tablet Take one tablet by mouth twice daily and an extra 1/2 tablet by mouth daily as needed for afib 225 tablet 3  . Multiple Vitamin (MULTIVITAMIN) tablet Take 1 tablet by mouth daily.    . quinapril (ACCUPRIL) 40 MG tablet TAKE 1 TABLET DAILY 30 tablet 0  . repaglinide (PRANDIN) 1 MG tablet Take 1 tablet (1 mg total) by mouth 3 (three) times daily before meals. 270 tablet 1  . rivaroxaban (XARELTO) 20 MG TABS tablet Take 1 tablet (  20 mg total) by mouth daily with supper. 90 tablet 3   No current facility-administered medications on file prior to visit.    Past Medical History  Diagnosis Date  . Diabetes mellitus     type 2  . Hyperlipidemia   . Hypertension   . Allergic rhinitis     seasonal  . Hypothyroidism   . AF (atrial fibrillation) (HCC)     persistent afib, s/p afib ablation x 2 with subsequent convergent procedure at [facility]  . CTS (carpal tunnel syndrome)     PMH of  . Renal calculus 2005    [facility]  . Atrial flutter [facility])     Past Surgical History  Procedure Laterality Date  . Cardioversion    . Afib 09/22/2008--11/03/2009      afib  ablation x 2 by Dr Rayann Heman with subsequent convergent procedure at [facility]  . Total hip arthroplasty  06/2010    Depuy prosthesis replacement, Dr Alvan Dame  . Cardioversion  12/09/2010    Procedure: CARDIOVERSION;  Surgeon: Loralie Champagne, MD;  Location: [place];  Service: Cardiovascular;  Laterality: N/A;  . Total hip arthroplasty Right 2008     Depuy;Dr Alvan Dame  . Tonsillectomy    . Cardioversion  09/15/2011    Procedure: CARDIOVERSION;  Surgeon: Josue Hector, MD;  Location: [place];  Service: Cardiovascular;  Laterality: N/A;  office drawing labs wed 8/28  . Loop recorder implant Left 12-2010  . Cardiac catheterization  2005    non obstructive CAD  . Convergent procedure  2013    at [facility] for a fib  . Cardioversion N/A 07/18/2012    Procedure: CARDIOVERSION;  Surgeon: Thayer Headings, MD;  Location: [place];  Service: Cardiovascular;  Laterality: N/A;  . Joint replacement Right 2008, 2012  . Ablation  06/2013    atypical atrial flutter ablation by Dr Lehman Prom at [facility] (mitral annular flutter ablated)  . Colonoscopy  2009    Dr Sharlett Iles  . Cardioversion  08/2014    [facility]    Social History   Social History  . Marital Status: Married    Spouse Name: Izora Gala  . Number of Children: Y  . Years of Education: N/A   Occupational History  . BUYER    Social History Main Topics  . Smoking status: Former Smoker -- 0.90 packs/day for 8 years    Types: Cigarettes    Quit date: 01/16/1974  . Smokeless tobacco: Not on file     Comment: smoked  1966- 1976,up to 1 ppd  . Alcohol Use: 0.0 oz/week    0 Standard drinks or equivalent per week     Comment: Socially; "sips socially"( essentially none)  . Drug Use: No  . Sexual Activity: Not on file   Other Topics Concern  . Not on file   Social History Narrative    Family History  Problem Relation Age of Onset  . Lung cancer Mother     non smoker  . Stroke Mother 18  . Diabetes Father   . Stroke Father     mid 57s  . Testicular  cancer Brother   . Allergies Maternal Grandmother   . Cancer Maternal Grandmother     unsure  . Allergies Brother   . Heart attack Neg Hx     Review of Systems  Constitutional: Negative for fever and chills.  HENT: Negative for ear pain.   Respiratory: Negative for cough, shortness of breath and wheezing.   Cardiovascular: Negative  for chest pain, palpitations and leg swelling.  Gastrointestinal:       No gerd  Neurological: Negative for dizziness, light-headedness, numbness and headaches.       Objective:   Filed Vitals:   05/05/15 1107  BP: 146/88  Pulse: 72  Temp: 97.9 F (36.6 C)  Resp: 16   Filed Weights   05/05/15 1107  Weight: 200 lb (90.719 kg)   Body mass index is 30.42 kg/(m^2).   Physical Exam Constitutional: Appears well-developed and well-nourished. No distress.  Neck: Neck supple. No tracheal deviation present. No thyromegaly present.  No carotid bruit. No cervical adenopathy.   Cardiovascular: Normal rate, regular rhythm and normal heart sounds.   No murmur heard.  No edema Pulmonary/Chest: Effort normal and breath sounds normal. No respiratory distress. No wheezes.  Msk: no lumbar spine pain with palpation Neuro:  Normal sensation and strength bilateral lower extremities, gait normal        Assessment & Plan:   See Problem List for Assessment and Plan of chronic medical problems.  F/u in 6 months, sooner if needed

## 2015-05-05 NOTE — Progress Notes (Signed)
Pre visit review using our clinic review tool, if applicable. No additional management support is needed unless otherwise documented below in the visit note. 

## 2015-05-05 NOTE — Patient Instructions (Addendum)
  Test(s) ordered today. Your results will be released to [place] (or called to you) after review, usually within 72hours after test completion. If any changes need to be made, you will be notified at that same time.  Medications reviewed and updated.  No changes recommended at this time in your regular medications.  We will start gabapentin at night for your nerve pain and hydrocodone as needed for pain.  Take the aleve twice daily with food for your pain.  .  Your prescription(s) have been submitted to your pharmacy. Please take as directed and contact our office if you believe you are having problem(s) with the medication(s).   Please followup in 6 months

## 2015-05-05 NOTE — Telephone Encounter (Signed)
Returned call to patient.Spoke to wife mychart help desk phone # given [phone].

## 2015-05-05 NOTE — Assessment & Plan Note (Signed)
BP elevated today, but in acute pain Continue current meds cmp

## 2015-05-05 NOTE — Telephone Encounter (Signed)
New message     Patient calling some how is deactivate from my chart -what does he need to do get back on my chart to communicate with nurse/  MD

## 2015-05-05 NOTE — Assessment & Plan Note (Signed)
No currently on a statin Will check lipid panel at next visit

## 2015-05-05 NOTE — Assessment & Plan Note (Signed)
Started about 4 days ago Continue aleve - take with food  Gabapentin at night 100-300 mg at bedtime - adjust if needed Hydrocodone for sever pain

## 2015-05-05 NOTE — Assessment & Plan Note (Signed)
Check tsh  Titrate med dose if needed  

## 2015-05-06 ENCOUNTER — Encounter: Payer: Self-pay | Admitting: Internal Medicine

## 2015-05-06 ENCOUNTER — Other Ambulatory Visit: Payer: Self-pay | Admitting: Internal Medicine

## 2015-05-06 DIAGNOSIS — E1151 Type 2 diabetes mellitus with diabetic peripheral angiopathy without gangrene: Secondary | ICD-10-CM

## 2015-07-09 DIAGNOSIS — I4891 Unspecified atrial fibrillation: Secondary | ICD-10-CM | POA: Diagnosis not present

## 2015-07-09 DIAGNOSIS — I4510 Unspecified right bundle-branch block: Secondary | ICD-10-CM | POA: Diagnosis not present

## 2015-07-09 DIAGNOSIS — Z9889 Other specified postprocedural states: Secondary | ICD-10-CM | POA: Diagnosis not present

## 2015-07-09 DIAGNOSIS — Z79899 Other long term (current) drug therapy: Secondary | ICD-10-CM | POA: Diagnosis not present

## 2015-07-09 DIAGNOSIS — Z7901 Long term (current) use of anticoagulants: Secondary | ICD-10-CM | POA: Diagnosis not present

## 2015-07-09 DIAGNOSIS — I10 Essential (primary) hypertension: Secondary | ICD-10-CM | POA: Diagnosis not present

## 2015-07-09 DIAGNOSIS — Z7984 Long term (current) use of oral hypoglycemic drugs: Secondary | ICD-10-CM | POA: Diagnosis not present

## 2015-07-09 DIAGNOSIS — E039 Hypothyroidism, unspecified: Secondary | ICD-10-CM | POA: Diagnosis not present

## 2015-07-09 DIAGNOSIS — E119 Type 2 diabetes mellitus without complications: Secondary | ICD-10-CM | POA: Diagnosis not present

## 2015-07-09 DIAGNOSIS — I481 Persistent atrial fibrillation: Secondary | ICD-10-CM | POA: Diagnosis not present

## 2015-07-15 ENCOUNTER — Telehealth: Payer: Self-pay | Admitting: Internal Medicine

## 2015-07-15 NOTE — Telephone Encounter (Signed)
New message      The pt is calling concerned a read time for his Tikosyn.

## 2015-07-15 NOTE — Telephone Encounter (Signed)
Saw Dr Lehman Prom last week and they have discussed him starting Tikosyn.  WE have not received the note as of today.  He last saw Dr Rayann Heman in 04/2014 and needs to be seen againg prior to a [place] admission.  I gave him July 12 or 7/24 and asked he call Melissa back to confirm time

## 2015-07-21 ENCOUNTER — Encounter: Payer: Self-pay | Admitting: Internal Medicine

## 2015-07-28 ENCOUNTER — Encounter: Payer: Self-pay | Admitting: Internal Medicine

## 2015-07-28 ENCOUNTER — Ambulatory Visit (INDEPENDENT_AMBULATORY_CARE_PROVIDER_SITE_OTHER): Payer: Medicare Other | Admitting: Internal Medicine

## 2015-07-28 VITALS — BP 140/80 | HR 78 | Ht 68.0 in | Wt 200.2 lb

## 2015-07-28 DIAGNOSIS — I4891 Unspecified atrial fibrillation: Secondary | ICD-10-CM | POA: Diagnosis not present

## 2015-07-28 DIAGNOSIS — I481 Persistent atrial fibrillation: Secondary | ICD-10-CM | POA: Diagnosis not present

## 2015-07-28 DIAGNOSIS — I4819 Other persistent atrial fibrillation: Secondary | ICD-10-CM

## 2015-07-28 LAB — CBC WITH DIFFERENTIAL/PLATELET
Basophils Absolute: 71 cells/uL (ref 0–200)
Basophils Relative: 1 %
Eosinophils Absolute: 710 cells/uL — ABNORMAL HIGH (ref 15–500)
Eosinophils Relative: 10 %
HCT: 42.8 % (ref 38.5–50.0)
Hemoglobin: 15 g/dL (ref 13.2–17.1)
Lymphocytes Relative: 26 %
Lymphs Abs: 1846 cells/uL (ref 850–3900)
MCH: 29.8 pg (ref 27.0–33.0)
MCHC: 35 g/dL (ref 32.0–36.0)
MCV: 84.9 fL (ref 80.0–100.0)
MPV: 11.2 fL (ref 7.5–12.5)
Monocytes Absolute: 781 cells/uL (ref 200–950)
Monocytes Relative: 11 %
Neutro Abs: 3692 cells/uL (ref 1500–7800)
Neutrophils Relative %: 52 %
Platelets: 218 10*3/uL (ref 140–400)
RBC: 5.04 MIL/uL (ref 4.20–5.80)
RDW: 14.9 % (ref 11.0–15.0)
WBC: 7.1 10*3/uL (ref 3.8–10.8)

## 2015-07-28 MED ORDER — METOPROLOL TARTRATE 50 MG PO TABS: 50.0000 mg | ORAL_TABLET | ORAL | Status: DC | PRN

## 2015-07-28 NOTE — Patient Instructions (Addendum)
Medication Instructions:  Your physician has recommended you make the following change in your medication:  1) Start Lopressor 50 mg---take 1 by mouth every 4 hours as needed for fast heart rates   Labwork: Your physician recommends that you return for lab work today: BMP/CBC      Testing/Procedures: None ordered   Follow-Up: Your physician wants you to follow-up in: 6 months with Dr Rayann Heman Dennis Bast will receive a reminder letter in the mail two months in advance. If you don't receive a letter, please call our office to schedule the follow-up appointment.   Any Other Special Instructions Will Be Listed Below (If Applicable).     If you need a refill on your cardiac medications before your next appointment, please call your pharmacy.

## 2015-07-29 LAB — BASIC METABOLIC PANEL
BUN: 26 mg/dL — ABNORMAL HIGH (ref 7–25)
CO2: 19 mmol/L — ABNORMAL LOW (ref 20–31)
Calcium: 9.7 mg/dL (ref 8.6–10.3)
Chloride: 105 mmol/L (ref 98–110)
Creat: 1.14 mg/dL (ref 0.70–1.25)
Glucose, Bld: 126 mg/dL — ABNORMAL HIGH (ref 65–99)
Potassium: 4.7 mmol/L (ref 3.5–5.3)
Sodium: 139 mmol/L (ref 135–146)

## 2015-07-30 NOTE — Progress Notes (Signed)
Electrophysiology Office Note   Date:  07/30/2015   ID:  [name], DOB 1945/06/13, MRN [id number]  PCP:  Binnie Rail, MD   Primary Electrophysiologist: Thompson Grayer, MD    Chief Complaint  Patient presents with  . Atrial Fibrillation     History of Present Illness: [name] is a 70 y.o. male who presents today for electrophysiology evaluation.  He has done very well off of tikosyn.  He has had a couple episodes of afib.  Once occurred after traveling internationally recently.  He has seen Dr Clyda Hurdle (his note reviewed) and had a discussion about starting tikosyn.  He presents today.  He does not wish to start tikosyn but would prefer to continue lifestyle modification (including avoidance of internationally travel). Today, he denies symptoms of palpitations, chest pain, shortness of breath, orthopnea, PND, lower extremity edema, claudication, dizziness, presyncope, syncope, bleeding, or neurologic sequela. The patient is tolerating medications without difficulties and is otherwise without complaint today.    Past Medical History  Diagnosis Date  . Diabetes mellitus     type 2  . Hyperlipidemia   . Hypertension   . Allergic rhinitis     seasonal  . Hypothyroidism   . AF (atrial fibrillation) (HCC)     persistent afib, s/p afib ablation x 2 with subsequent convergent procedure at [facility]  . CTS (carpal tunnel syndrome)     PMH of  . Renal calculus 2005    [facility]  . Atrial flutter [facility])    Past Surgical History  Procedure Laterality Date  . Cardioversion    . Afib 09/22/2008--11/03/2009      afib ablation x 2 by Dr Rayann Heman with subsequent convergent procedure at [facility]  . Total hip arthroplasty  06/2010    Depuy prosthesis replacement, Dr Alvan Dame  . Cardioversion  12/09/2010    Procedure: CARDIOVERSION;  Surgeon: Loralie Champagne, MD;  Location: [place];  Service: Cardiovascular;  Laterality: N/A;  . Total hip arthroplasty Right 2008     Depuy;Dr Alvan Dame  .  Tonsillectomy    . Cardioversion  09/15/2011    Procedure: CARDIOVERSION;  Surgeon: Josue Hector, MD;  Location: [place];  Service: Cardiovascular;  Laterality: N/A;  office drawing labs wed 8/28  . Loop recorder implant Left 12-2010  . Cardiac catheterization  2005    non obstructive CAD  . Convergent procedure  2013    at [facility] for a fib  . Cardioversion N/A 07/18/2012    Procedure: CARDIOVERSION;  Surgeon: Thayer Headings, MD;  Location: [place];  Service: Cardiovascular;  Laterality: N/A;  . Joint replacement Right 2008, 2012  . Ablation  06/2013    atypical atrial flutter ablation by Dr Lehman Prom at [facility] (mitral annular flutter ablated)  . Colonoscopy  2009    Dr Sharlett Iles  . Cardioversion  08/2014    [place], [place]     Current Outpatient Prescriptions  Medication Sig Dispense Refill  . amLODipine (NORVASC) 10 MG tablet Take 1 tablet (10 mg total) by mouth daily. 90 tablet 3  . latanoprost (XALATAN) 0.005 % ophthalmic solution Place 1 drop into the left eye at bedtime.    Marland Kitchen levothyroxine (SYNTHROID, LEVOTHROID) 25 MCG tablet TAKE ONE AND ONE-HALF TABLETS BY MOUTH EVERY DAY 135 tablet 1  . Magnesium Oxide 500 MG CAPS Take 1 capsule by mouth 2 (two) times daily.    . metFORMIN (GLUCOPHAGE-XR) 500 MG 24 hr tablet Take 1,000 mg by mouth 2 (two)  times daily with a meal.    . metoprolol succinate (TOPROL-XL) 100 MG 24 hr tablet Take one tablet by mouth twice daily and an extra 1/2 tablet by mouth daily as needed for afib 225 tablet 3  . quinapril (ACCUPRIL) 40 MG tablet Take 1 tablet (40 mg total) by mouth daily. 90 tablet 1  . repaglinide (PRANDIN) 1 MG tablet Take 1 tablet (1 mg total) by mouth 3 (three) times daily before meals. 270 tablet 1  . rivaroxaban (XARELTO) 20 MG TABS tablet Take 1 tablet (20 mg total) by mouth daily with supper. 90 tablet 3  . metoprolol (LOPRESSOR) 50 MG tablet Take 1 tablet (50 mg total) by mouth every 4 (four) hours as needed (Fast heart rate). 30 tablet  3   No current facility-administered medications for this visit.    Allergies:   Review of patient's allergies indicates no known allergies.   Social History:  The patient  reports that he quit smoking about 41 years ago. His smoking use included Cigarettes. He has a 7.2 pack-year smoking history. He does not have any smokeless tobacco history on file. He reports that he drinks alcohol. He reports that he does not use illicit drugs.   Family History:  The patient's  family history includes Allergies in his brother and maternal grandmother; Cancer in his maternal grandmother; Diabetes in his father; Lung cancer in his mother; Stroke in his father; Stroke (age of onset: 24) in his mother; Testicular cancer in his brother. There is no history of Heart attack.    ROS:  Please see the history of present illness.   All other systems are reviewed and negative.    PHYSICAL EXAM: VS:  BP 140/80 mmHg  Pulse 78  Ht 5\' 8"  (1.727 m)  Wt 200 lb 3.2 oz (90.81 kg)  BMI 30.45 kg/m2 , BMI Body mass index is 30.45 kg/(m^2). GEN: Well nourished, well developed, in no acute distress HEENT: normal Neck: no JVD, carotid bruits, or masses Cardiac: RRR; no murmurs, rubs, or gallops,no edema  Respiratory:  clear to auscultation bilaterally, normal work of breathing GI: soft, nontender, nondistended, + BS MS: no deformity or atrophy Skin: warm and dry  Neuro:  Strength and sensation are intact Psych: euthymic mood, full affect  ILR is reviewed today.  Device is at EOS.  Recent Labs: 04/23/2015: Magnesium 2.0 05/05/2015: ALT 30; TSH 4.47 07/28/2015: BUN 26*; Creat 1.14; Hemoglobin 15.0; Platelets 218; Potassium 4.7; Sodium 139    Lipid Panel     Component Value Date/Time   CHOL 147 05/12/2014 0939   TRIG 174.0* 05/12/2014 0939   HDL 43.90 05/12/2014 0939   CHOLHDL 3 05/12/2014 0939   VLDL 34.8 05/12/2014 0939   LDLCALC 68 05/12/2014 0939     Wt Readings from Last 3 Encounters:  07/28/15 200 lb  3.2 oz (90.81 kg)  05/05/15 200 lb (90.719 kg)  04/23/15 201 lb 15.1 oz (91.6 kg)      ASSESSMENT AND PLAN:  1.  Persistent atrial fibrillation/ atypical atrial flutter maintaining sinus rhythm for the most part off AAD chads2vasc score is at least 3 Continue xarelto He wishes to avoid any changes currently but will take lopressor prn if he has afib.  If episodes increase then he may be more willing to consider tikosyn.  As his ILR continues to give [place] information, we will keep it in place for now.  Once no longer functioning we will probably remove.  2. htn Stable No change  required today   Current medicines are reviewed at length with the patient today.   The patient does not have concerns regarding his medicines.  The following changes were made today:  none   Follow-up: return to see me in 6 months   Signed, Thompson Grayer, MD  07/30/2015 12:36 PM     [place] [address] [place] [address] [phone] (office) [phone] (fax)

## 2015-08-30 ENCOUNTER — Other Ambulatory Visit: Payer: Self-pay | Admitting: Internal Medicine

## 2015-08-30 DIAGNOSIS — E039 Hypothyroidism, unspecified: Secondary | ICD-10-CM

## 2015-08-30 DIAGNOSIS — E1151 Type 2 diabetes mellitus with diabetic peripheral angiopathy without gangrene: Secondary | ICD-10-CM

## 2015-09-03 DIAGNOSIS — L658 Other specified nonscarring hair loss: Secondary | ICD-10-CM | POA: Diagnosis not present

## 2015-09-03 DIAGNOSIS — D225 Melanocytic nevi of trunk: Secondary | ICD-10-CM | POA: Diagnosis not present

## 2015-09-03 DIAGNOSIS — D485 Neoplasm of uncertain behavior of skin: Secondary | ICD-10-CM | POA: Diagnosis not present

## 2015-09-03 DIAGNOSIS — Z1283 Encounter for screening for malignant neoplasm of skin: Secondary | ICD-10-CM | POA: Diagnosis not present

## 2015-09-10 ENCOUNTER — Other Ambulatory Visit: Payer: Self-pay

## 2015-09-28 ENCOUNTER — Other Ambulatory Visit (INDEPENDENT_AMBULATORY_CARE_PROVIDER_SITE_OTHER): Payer: Medicare Other

## 2015-09-28 ENCOUNTER — Ambulatory Visit (INDEPENDENT_AMBULATORY_CARE_PROVIDER_SITE_OTHER): Payer: Medicare Other | Admitting: Internal Medicine

## 2015-09-28 ENCOUNTER — Encounter: Payer: Self-pay | Admitting: Internal Medicine

## 2015-09-28 VITALS — BP 124/74 | HR 75 | Temp 98.0°F | Resp 16 | Wt 201.0 lb

## 2015-09-28 DIAGNOSIS — E039 Hypothyroidism, unspecified: Secondary | ICD-10-CM

## 2015-09-28 DIAGNOSIS — Z23 Encounter for immunization: Secondary | ICD-10-CM | POA: Diagnosis not present

## 2015-09-28 DIAGNOSIS — Z1159 Encounter for screening for other viral diseases: Secondary | ICD-10-CM | POA: Diagnosis not present

## 2015-09-28 DIAGNOSIS — E785 Hyperlipidemia, unspecified: Secondary | ICD-10-CM

## 2015-09-28 DIAGNOSIS — I10 Essential (primary) hypertension: Secondary | ICD-10-CM | POA: Diagnosis not present

## 2015-09-28 DIAGNOSIS — E1151 Type 2 diabetes mellitus with diabetic peripheral angiopathy without gangrene: Secondary | ICD-10-CM | POA: Diagnosis not present

## 2015-09-28 LAB — COMPREHENSIVE METABOLIC PANEL
ALT: 25 U/L (ref 0–53)
AST: 19 U/L (ref 0–37)
Albumin: 4.5 g/dL (ref 3.5–5.2)
Alkaline Phosphatase: 38 U/L — ABNORMAL LOW (ref 39–117)
BUN: 18 mg/dL (ref 6–23)
CO2: 29 mEq/L (ref 19–32)
Calcium: 9.6 mg/dL (ref 8.4–10.5)
Chloride: 105 mEq/L (ref 96–112)
Creatinine, Ser: 0.95 mg/dL (ref 0.40–1.50)
GFR: 83.56 mL/min (ref >60.00)
Glucose, Bld: 147 mg/dL — ABNORMAL HIGH (ref 70–99)
Potassium: 4.8 mEq/L (ref 3.5–5.1)
Sodium: 142 mEq/L (ref 135–145)
Total Bilirubin: 0.5 mg/dL (ref 0.2–1.2)
Total Protein: 7.5 g/dL (ref 6.0–8.3)

## 2015-09-28 LAB — HEMOGLOBIN A1C: Hgb A1c MFr Bld: 7.6 % — ABNORMAL HIGH (ref 4.6–6.5)

## 2015-09-28 LAB — LIPID PANEL
Cholesterol: 160 mg/dL (ref 0–200)
HDL: 41.3 mg/dL (ref >39.00)
LDL Cholesterol: 84 mg/dL (ref 0–99)
NonHDL: 119.1
Total CHOL/HDL Ratio: 4
Triglycerides: 174 mg/dL — ABNORMAL HIGH (ref 0.0–149.0)
VLDL: 34.8 mg/dL (ref 0.0–40.0)

## 2015-09-28 LAB — TSH: TSH: 4.56 u[IU]/mL — ABNORMAL HIGH (ref 0.35–4.50)

## 2015-09-28 NOTE — Assessment & Plan Note (Signed)
Check a1c  Lab Results  Component Value Date   HGBA1C 8.0 (H) 05/05/2015   Stressed regular exercise Work on [employer] loss Will adjust medication if needed Eye exams up to date

## 2015-09-28 NOTE — Patient Instructions (Addendum)
  Test(s) ordered today. Your results will be released to MyChart (or called to you) after review, usually within 72hours after test completion. If any changes need to be made, you will be notified at that same time.  All other Health Maintenance issues reviewed.   All recommended immunizations and age-appropriate screenings are up-to-date or discussed.  Flu vaccine administered today.   Medications reviewed and updated.  No changes recommended at this time.    Please followup in 6 months   

## 2015-09-28 NOTE — Assessment & Plan Note (Signed)
Check tsh  Titrate med dose if needed  

## 2015-09-28 NOTE — Assessment & Plan Note (Signed)
Not on a statin Last lipid panel controlled with lifestyle Check lipids

## 2015-09-28 NOTE — Assessment & Plan Note (Signed)
BP well controlled Current regimen effective and well tolerated Continue current medications at current doses cmp  

## 2015-09-28 NOTE — Progress Notes (Signed)
Pre visit review using our clinic review tool, if applicable. No additional management support is needed unless otherwise documented below in the visit note. 

## 2015-09-28 NOTE — Progress Notes (Signed)
Subjective:    Patient ID: [name], male    DOB: [birth year], 70 y.o.   MRN: [id number]  HPI The patient is here for follow up.  Diabetes: He is taking his medication daily as prescribed. He is compliant with a diabetic diet. He is not exercising regularly. He monitors his sugars and they have been running 132, 127, 141, 151, 141, 143, 135. He checks his feet daily and denies foot lesions. He is up-to-date with an ophthalmology examination.   Hypertension: He is taking his medication daily. He is compliant with a low sodium diet.  He denies chest pain, palpitations, edema, shortness of breath and regular headaches. He is not exercising regularly.  He does not monitor his blood pressure at home.    Hypothyroidism:  He is taking his medication daily.  He denies any recent changes in energy or weight that are unexplained.   His cholesterol has been controlled with diet.  He is not currently on a statin.   Medications and allergies reviewed with patient and updated if appropriate.  Patient Active Problem List   Diagnosis Date Noted  . Lumbar radiculopathy 05/05/2015  . Hypercalcemia 03/23/2014  . Hypothyroidism 12/30/2010  . OSA (obstructive sleep apnea) 09/02/2010  . Atrial fibrillation ([place]) 04/05/2009  . EDEMA 04/05/2009  . ERECTILE DYSFUNCTION 05/01/2008  . Diabetes mellitus with peripheral circulatory disorder, controlled ([place]) 09/06/2007  . Hyperlipidemia 09/06/2007  . Essential hypertension 09/06/2007  . 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 amLODipine (NORVASC) 10 MG tablet Take 1 tablet (10 mg total) by mouth daily. 90 tablet 3  . latanoprost (XALATAN) 0.005 % ophthalmic solution Place 1 drop into the left eye at bedtime.    Marland Kitchen levothyroxine (SYNTHROID, LEVOTHROID) 25 MCG tablet TAKE 1 AND 1/2 TABLETS     DAILY 135 tablet 1  . Magnesium Oxide 500  MG CAPS Take 1 capsule by mouth 2 (two) times daily.    . metFORMIN (GLUCOPHAGE-XR) 500 MG 24 hr tablet TAKE 2 TABLETS (1,000MG )   TWO TIMES A DAY WITH MEALS 360 tablet 1  . metoprolol (LOPRESSOR) 50 MG tablet Take 1 tablet (50 mg total) by mouth every 4 (four) hours as needed (Fast heart rate). 30 tablet 3  . metoprolol succinate (TOPROL-XL) 100 MG 24 hr tablet Take one tablet by mouth twice daily and an extra 1/2 tablet by mouth daily as needed for afib 225 tablet 3  . quinapril (ACCUPRIL) 40 MG tablet Take 1 tablet (40 mg total) by mouth daily. 90 tablet 1  . repaglinide (PRANDIN) 1 MG tablet TAKE 1 TABLET 3 TIMES A DAYBEFORE MEALS 270 tablet 1  . rivaroxaban (XARELTO) 20 MG TABS tablet Take 1 tablet (20 mg total) by mouth daily with supper. 90 tablet 3   No current facility-administered medications on file prior to visit.     Past Medical History:  Diagnosis Date  . AF (atrial fibrillation) (HCC)    persistent afib, s/p afib ablation x 2 with subsequent convergent procedure at [facility]  . Allergic rhinitis    seasonal  . Atrial flutter ([place])   . CTS (carpal tunnel syndrome)    PMH of  . Diabetes mellitus    type 2  . Hyperlipidemia   . Hypertension   . Hypothyroidism   . Renal calculus 2005   [facility]    Past Surgical History:  Procedure  Laterality Date  . ABLATION  06/2013   atypical atrial flutter ablation by Dr Lehman Prom at [facility] (mitral annular flutter ablated)  . afib 09/22/2008--11/03/2009     afib ablation x 2 by Dr Rayann Heman with subsequent convergent procedure at [facility]  . CARDIAC CATHETERIZATION  2005   non obstructive CAD  . CARDIOVERSION    . CARDIOVERSION  12/09/2010   Procedure: CARDIOVERSION;  Surgeon: Loralie Champagne, MD;  Location: [place];  Service: Cardiovascular;  Laterality: N/A;  . CARDIOVERSION  09/15/2011   Procedure: CARDIOVERSION;  Surgeon: Josue Hector, MD;  Location: [place];  Service: Cardiovascular;  Laterality: N/A;  office drawing labs wed 8/28  .  CARDIOVERSION N/A 07/18/2012   Procedure: CARDIOVERSION;  Surgeon: Thayer Headings, MD;  Location: [place];  Service: Cardiovascular;  Laterality: N/A;  . CARDIOVERSION  08/2014   [place]  . COLONOSCOPY  2009   Dr Sharlett Iles  . convergent procedure  2013   at [facility] for a fib  . JOINT REPLACEMENT Right 2008, 2012  . LOOP RECORDER IMPLANT Left 12-2010  . TONSILLECTOMY    . TOTAL HIP ARTHROPLASTY  06/2010   Depuy prosthesis replacement, Dr Alvan Dame  . TOTAL HIP ARTHROPLASTY Right 2008    Depuy;Dr Alvan Dame    Social History   Social History  . Marital status: Married    Spouse name: Izora Gala  . Number of children: Y  . Years of education: N/A   Occupational History  . BUYER [employer]   Social History Main Topics  . Smoking status: Former Smoker    Packs/day: 0.90    Years: 8.00    Types: Cigarettes    Quit date: 01/16/1974  . Smokeless tobacco: Not on file     Comment: smoked  1966- 1976,up to 1 ppd  . Alcohol use 0.0 oz/week     Comment: Socially; "sips socially"( essentially none)  . Drug use: No  . Sexual activity: Not on file   Other Topics Concern  . Not on file   Social History Narrative  . No narrative on file    Family History  Problem Relation Age of Onset  . Lung cancer Mother     non smoker  . Stroke Mother 15  . Diabetes Father   . Stroke Father     mid 18s  . Testicular cancer Brother   . Allergies Maternal Grandmother   . Cancer Maternal Grandmother     unsure  . Allergies Brother   . Heart attack Neg Hx     Review of Systems  Constitutional: Negative for fever.  Respiratory: Negative for cough, shortness of breath and wheezing.   Cardiovascular: Positive for palpitations (occasional). Negative for chest pain and leg swelling.  Neurological: Negative for dizziness, light-headedness, numbness and headaches.       Objective:   Vitals:   09/28/15 0848  BP: 124/74  Pulse: 75  Resp: 16  Temp: 98 F (36.7 C)   Filed Weights     09/28/15 0848  Weight: 201 lb (91.2 kg)   Body mass index is 30.56 kg/m.   Physical Exam    Constitutional: Appears well-developed and well-nourished. No distress.  HENT:  Head: Normocephalic and atraumatic.  Neck: Neck supple. No tracheal deviation present. No thyromegaly present.  Cardiovascular: Normal rate, regular rhythm and normal heart sounds.   No murmur heard.  No carotid bruit  Pulmonary/Chest: Effort normal and breath sounds normal. No respiratory distress. No has no wheezes.  No rales.  Musculoskeletal: No edema.  Lymphadenopathy: No cervical adenopathy.  Skin: Skin is warm and dry. Not diaphoretic.  Psychiatric: Normal mood and affect. Behavior is normal.     Assessment & Plan:    See Problem List for Assessment and Plan of chronic medical problems.    F/u 6 months

## 2015-09-29 LAB — HEPATITIS C ANTIBODY: HCV Ab: NEGATIVE

## 2015-09-29 LAB — PARATHYROID HORMONE, INTACT (NO CA): PTH: 37 pg/mL (ref 14–64)

## 2015-10-03 ENCOUNTER — Other Ambulatory Visit: Payer: Self-pay | Admitting: Internal Medicine

## 2015-10-03 ENCOUNTER — Encounter: Payer: Self-pay | Admitting: Internal Medicine

## 2015-10-03 MED ORDER — LEVOTHYROXINE SODIUM 50 MCG PO TABS: 50.0000 ug | ORAL_TABLET | Freq: Every day | ORAL | 3 refills | Status: DC

## 2015-10-04 ENCOUNTER — Other Ambulatory Visit: Payer: Self-pay | Admitting: Emergency Medicine

## 2015-10-04 ENCOUNTER — Encounter: Payer: Self-pay | Admitting: Internal Medicine

## 2015-10-04 MED ORDER — LEVOTHYROXINE SODIUM 50 MCG PO TABS: 50.0000 ug | ORAL_TABLET | Freq: Every day | ORAL | 3 refills | Status: DC

## 2015-10-05 DIAGNOSIS — R351 Nocturia: Secondary | ICD-10-CM | POA: Diagnosis not present

## 2015-10-05 DIAGNOSIS — N401 Enlarged prostate with lower urinary tract symptoms: Secondary | ICD-10-CM | POA: Diagnosis not present

## 2015-10-12 ENCOUNTER — Ambulatory Visit: Payer: Medicare Other | Admitting: Internal Medicine

## 2015-11-01 ENCOUNTER — Other Ambulatory Visit: Payer: Self-pay | Admitting: *Deleted

## 2015-11-01 ENCOUNTER — Encounter: Payer: Self-pay | Admitting: Internal Medicine

## 2015-11-01 MED ORDER — RIVAROXABAN 20 MG PO TABS: 20.0000 mg | ORAL_TABLET | Freq: Every day | ORAL | 1 refills | Status: DC

## 2015-11-12 ENCOUNTER — Ambulatory Visit (INDEPENDENT_AMBULATORY_CARE_PROVIDER_SITE_OTHER): Payer: Medicare Other | Admitting: Ophthalmology

## 2015-11-12 DIAGNOSIS — H43813 Vitreous degeneration, bilateral: Secondary | ICD-10-CM

## 2015-11-12 DIAGNOSIS — E113293 Type 2 diabetes mellitus with mild nonproliferative diabetic retinopathy without macular edema, bilateral: Secondary | ICD-10-CM

## 2015-11-12 DIAGNOSIS — H353111 Nonexudative age-related macular degeneration, right eye, early dry stage: Secondary | ICD-10-CM | POA: Diagnosis not present

## 2015-11-12 DIAGNOSIS — H35033 Hypertensive retinopathy, bilateral: Secondary | ICD-10-CM | POA: Diagnosis not present

## 2015-11-12 DIAGNOSIS — I10 Essential (primary) hypertension: Secondary | ICD-10-CM

## 2015-11-12 DIAGNOSIS — E11319 Type 2 diabetes mellitus with unspecified diabetic retinopathy without macular edema: Secondary | ICD-10-CM | POA: Diagnosis not present

## 2015-11-12 LAB — HM DIABETES EYE EXAM

## 2015-11-22 DIAGNOSIS — Z96641 Presence of right artificial hip joint: Secondary | ICD-10-CM | POA: Diagnosis not present

## 2015-11-22 DIAGNOSIS — Z471 Aftercare following joint replacement surgery: Secondary | ICD-10-CM | POA: Diagnosis not present

## 2015-11-25 ENCOUNTER — Encounter: Payer: Self-pay | Admitting: Internal Medicine

## 2015-11-30 ENCOUNTER — Other Ambulatory Visit (HOSPITAL_COMMUNITY): Payer: Self-pay | Admitting: Orthopedic Surgery

## 2015-11-30 DIAGNOSIS — Z471 Aftercare following joint replacement surgery: Secondary | ICD-10-CM

## 2015-11-30 DIAGNOSIS — Z96641 Presence of right artificial hip joint: Principal | ICD-10-CM

## 2015-12-07 ENCOUNTER — Encounter (HOSPITAL_COMMUNITY)
Admission: RE | Admit: 2015-12-07 | Discharge: 2015-12-07 | Disposition: A | Discharge status: Home / Self Care | Payer: Medicare Other | Source: Ambulatory Visit | Attending: Orthopedic Surgery | Admitting: Orthopedic Surgery

## 2015-12-07 DIAGNOSIS — Z471 Aftercare following joint replacement surgery: Secondary | ICD-10-CM | POA: Insufficient documentation

## 2015-12-07 DIAGNOSIS — Z96641 Presence of right artificial hip joint: Secondary | ICD-10-CM | POA: Insufficient documentation

## 2015-12-07 DIAGNOSIS — M25551 Pain in right hip: Secondary | ICD-10-CM | POA: Diagnosis not present

## 2015-12-07 MED ORDER — TECHNETIUM TC 99M MEDRONATE IV KIT
25.0000 | PACK | Freq: Once | INTRAVENOUS | Status: AC | PRN
  Administered 2015-12-07: 25 via INTRAVENOUS

## 2015-12-29 ENCOUNTER — Encounter: Payer: Self-pay | Admitting: Internal Medicine

## 2015-12-30 ENCOUNTER — Other Ambulatory Visit: Payer: Self-pay | Admitting: Emergency Medicine

## 2015-12-30 MED ORDER — QUINAPRIL HCL 40 MG PO TABS: 40.0000 mg | ORAL_TABLET | Freq: Every day | ORAL | 2 refills | Status: DC

## 2015-12-30 NOTE — Telephone Encounter (Signed)
Patient would also like to know if Dr. Quay Burow could take wife on as patient as well.

## 2015-12-30 NOTE — Telephone Encounter (Signed)
Patient called back requesting status on refill.  Please follow up in regard at [phone].  Patient states several faxed have came this week and he is almost out of medication.  Patient uses [facility] and would like a 90 day script.

## 2015-12-30 NOTE — Telephone Encounter (Signed)
Please advise. RX was sent.

## 2015-12-30 NOTE — Telephone Encounter (Signed)
Yes I will accept his wife.

## 2016-01-03 DIAGNOSIS — Z96649 Presence of unspecified artificial hip joint: Secondary | ICD-10-CM | POA: Diagnosis not present

## 2016-01-03 DIAGNOSIS — M25551 Pain in right hip: Secondary | ICD-10-CM | POA: Diagnosis not present

## 2016-01-03 DIAGNOSIS — Z96641 Presence of right artificial hip joint: Secondary | ICD-10-CM | POA: Diagnosis not present

## 2016-01-03 DIAGNOSIS — Z471 Aftercare following joint replacement surgery: Secondary | ICD-10-CM | POA: Diagnosis not present

## 2016-01-18 ENCOUNTER — Encounter: Payer: Self-pay | Admitting: Internal Medicine

## 2016-01-19 ENCOUNTER — Other Ambulatory Visit: Payer: Self-pay | Admitting: *Deleted

## 2016-01-19 MED ORDER — RIVAROXABAN 20 MG PO TABS: 20.0000 mg | ORAL_TABLET | Freq: Every day | ORAL | 3 refills | Status: DC

## 2016-01-19 MED ORDER — RIVAROXABAN 20 MG PO TABS: 20.0000 mg | ORAL_TABLET | Freq: Every day | ORAL | 1 refills | Status: DC

## 2016-01-21 ENCOUNTER — Ambulatory Visit (INDEPENDENT_AMBULATORY_CARE_PROVIDER_SITE_OTHER): Payer: Medicare Other | Admitting: Internal Medicine

## 2016-01-21 VITALS — BP 162/72 | HR 75 | Ht 68.0 in | Wt 202.0 lb

## 2016-01-21 DIAGNOSIS — I4819 Other persistent atrial fibrillation: Secondary | ICD-10-CM

## 2016-01-21 DIAGNOSIS — I481 Persistent atrial fibrillation: Secondary | ICD-10-CM

## 2016-01-21 DIAGNOSIS — I10 Essential (primary) hypertension: Secondary | ICD-10-CM | POA: Diagnosis not present

## 2016-01-21 NOTE — Patient Instructions (Signed)
Medication Instructions:  Your physician recommends that you continue on your current medications as directed. Please refer to the Current Medication list given to you today.  Check and see if Pradaxa is cheaper  Labwork: None ordered   Testing/Procedures: None ordered   Follow-Up: Your physician wants you to follow-up in: 12 months with Dr Vallery Ridge will receive a reminder letter in the mail two months in advance. If you don't receive a letter, please call our office to schedule the follow-up appointment.   Any Other Special Instructions Will Be Listed Below (If Applicable).     If you need a refill on your cardiac medications before your next appointment, please call your pharmacy.

## 2016-01-21 NOTE — Progress Notes (Signed)
Electrophysiology Office Note   Date:  01/21/2016   ID:  [name], DOB July 26, 1945, MRN [id number]  PCP:  Binnie Rail, MD   Primary Electrophysiologist: Thompson Grayer, MD    No chief complaint on file.    History of Present Illness: [name] is a 71 y.o. male who presents today for electrophysiology evaluation.  He has done very well off of tikosyn.  He hasn't had recurrent AF since April. He has been able to identify travel and stressors as triggers for AF and has been avoiding with success. He is not retired which has helped with stress level.   Today, he denies symptoms of palpitations, chest pain, shortness of breath, orthopnea, PND, lower extremity edema, claudication, dizziness, presyncope, syncope, bleeding, or neurologic sequela. The patient is tolerating medications without difficulties and is otherwise without complaint today.    Past Medical History:  Diagnosis Date  . AF (atrial fibrillation) (HCC)    persistent afib, s/p afib ablation x 2 with subsequent convergent procedure at [facility]  . Allergic rhinitis    seasonal  . Atrial flutter ([place])   . CTS (carpal tunnel syndrome)    PMH of  . Diabetes mellitus    type 2  . Hyperlipidemia   . Hypertension   . Hypothyroidism   . Renal calculus 2005   [facility]   Past Surgical History:  Procedure Laterality Date  . ABLATION  06/2013   atypical atrial flutter ablation by Dr Lehman Prom at [facility] (mitral annular flutter ablated)  . afib 09/22/2008--11/03/2009     afib ablation x 2 by Dr Rayann Heman with subsequent convergent procedure at [facility]  . CARDIAC CATHETERIZATION  2005   non obstructive CAD  . CARDIOVERSION    . CARDIOVERSION  12/09/2010   Procedure: CARDIOVERSION;  Surgeon: Loralie Champagne, MD;  Location: [place];  Service: Cardiovascular;  Laterality: N/A;  . CARDIOVERSION  09/15/2011   Procedure: CARDIOVERSION;  Surgeon: Josue Hector, MD;  Location: [place];  Service: Cardiovascular;  Laterality:  N/A;  office drawing labs wed 8/28  . CARDIOVERSION N/A 07/18/2012   Procedure: CARDIOVERSION;  Surgeon: Thayer Headings, MD;  Location: [place];  Service: Cardiovascular;  Laterality: N/A;  . CARDIOVERSION  08/2014   [place]  . COLONOSCOPY  2009   Dr Sharlett Iles  . convergent procedure  2013   at [facility] for a fib  . JOINT REPLACEMENT Right 2008, 2012  . LOOP RECORDER IMPLANT Left 12-2010  . TONSILLECTOMY    . TOTAL HIP ARTHROPLASTY  06/2010   Depuy prosthesis replacement, Dr Alvan Dame  . TOTAL HIP ARTHROPLASTY Right 2008    Depuy;Dr Alvan Dame     Current Outpatient Prescriptions  Medication Sig Dispense Refill  . amLODipine (NORVASC) 10 MG tablet Take 1 tablet (10 mg total) by mouth daily. 90 tablet 3  . latanoprost (XALATAN) 0.005 % ophthalmic solution Place 1 drop into the left eye at bedtime.    Marland Kitchen levothyroxine (SYNTHROID, LEVOTHROID) 50 MCG tablet Take 1 tablet (50 mcg total) by mouth daily. 90 tablet 3  . Magnesium Oxide 500 MG CAPS Take 1 capsule by mouth 2 (two) times daily.    . metFORMIN (GLUCOPHAGE-XR) 500 MG 24 hr tablet TAKE 2 TABLETS (1,000MG )   TWO TIMES A DAY WITH MEALS 360 tablet 1  . metoprolol (LOPRESSOR) 50 MG tablet Take 1 tablet (50 mg total) by mouth every 4 (four) hours as needed (Fast heart rate). 30 tablet 3  . metoprolol succinate (  TOPROL-XL) 100 MG 24 hr tablet Take one tablet by mouth twice daily and an extra 1/2 tablet by mouth daily as needed for afib 225 tablet 3  . quinapril (ACCUPRIL) 40 MG tablet Take 1 tablet (40 mg total) by mouth daily. 90 tablet 2  . repaglinide (PRANDIN) 1 MG tablet TAKE 1 TABLET 3 TIMES A DAYBEFORE MEALS 270 tablet 1  . rivaroxaban (XARELTO) 20 MG TABS tablet Take 1 tablet (20 mg total) by mouth daily with supper. 90 tablet 1   No current facility-administered medications for this visit.     Allergies:   Patient has no known allergies.   Social History:  The patient  reports that he quit smoking about 42 years ago. His smoking use  included Cigarettes. He has a 7.20 pack-year smoking history. He does not have any smokeless tobacco history on file. He reports that he drinks alcohol. He reports that he does not use drugs.   Family History:  The patient's  family history includes Allergies in his brother and maternal grandmother; Cancer in his maternal grandmother; Diabetes in his father; Lung cancer in his mother; Stroke in his father; Stroke (age of onset: 32) in his mother; Testicular cancer in his brother.    ROS:  Please see the history of present illness.   All other systems are reviewed and negative.    PHYSICAL EXAM: VS:  BP (!) 162/72   Pulse 75   Ht 5\' 8"  (1.727 m)   Wt 202 lb (91.6 kg)   BMI 30.71 kg/m  , BMI Body mass index is 30.71 kg/m. GEN: Well nourished, well developed, in no acute distress  HEENT: normal  Neck: no JVD, carotid bruits, or masses Cardiac: RRR; no murmurs, rubs, or gallops,no edema  Respiratory:  clear to auscultation bilaterally, normal work of breathing GI: soft, nontender, nondistended, + BS MS: no deformity or atrophy  Skin: warm and dry  Neuro:  Strength and sensation are intact Psych: euthymic mood, full affect   Recent Labs: 04/23/2015: Magnesium 2.0 07/28/2015: Hemoglobin 15.0; Platelets 218 09/28/2015: ALT 25; BUN 18; Creatinine, Ser 0.95; Potassium 4.8; Sodium 142; TSH 4.56    Lipid Panel     Component Value Date/Time   CHOL 160 09/28/2015 0932   TRIG 174.0 (H) 09/28/2015 0932   HDL 41.30 09/28/2015 0932   CHOLHDL 4 09/28/2015 0932   VLDL 34.8 09/28/2015 0932   LDLCALC 84 09/28/2015 0932     Wt Readings from Last 3 Encounters:  01/21/16 202 lb (91.6 kg)  09/28/15 201 lb (91.2 kg)  07/28/15 200 lb 3.2 oz (90.8 kg)      ASSESSMENT AND PLAN:  1.  Persistent atrial fibrillation/ atypical atrial flutter maintaining sinus rhythm by symptoms Continue Xarelto for chads2vasc score of at least 3 He wishes to avoid any changes currently but will take lopressor  prn if he has afib.  If episodes increase then he may be more willing to consider tikosyn.   ILR now not able to be interrogated. We discussed removal today. He will call when he would like to schedule.   2.  HTN Stable No change required today   Current medicines are reviewed at length with the patient today.   The patient does not have concerns regarding his medicines.  The following changes were made today:  none   Follow-up: return to see me in 1 year    Signed, Thompson Grayer, MD  01/21/2016 3:59 PM     [place] [id number]  [address] [place] [address] [phone] (office) [phone] (fax)

## 2016-01-26 ENCOUNTER — Encounter: Payer: Self-pay | Admitting: Internal Medicine

## 2016-02-03 ENCOUNTER — Ambulatory Visit: Payer: Medicare Other | Admitting: Physical Therapy

## 2016-02-04 ENCOUNTER — Ambulatory Visit: Payer: Medicare Other | Attending: Sports Medicine | Admitting: Physical Therapy

## 2016-02-04 ENCOUNTER — Encounter: Payer: Self-pay | Admitting: Physical Therapy

## 2016-02-04 DIAGNOSIS — M25551 Pain in right hip: Secondary | ICD-10-CM | POA: Diagnosis not present

## 2016-02-04 NOTE — Therapy (Signed)
[address] [place], [place], [address] Phone: [phone]   Fax:  [phone]  Physical Therapy Evaluation  Patient Details  Name: [name] MRN: [id number] Date of Birth: Nov 29, 1945 Referring Provider: Alvan Dame  Encounter Date: 02/04/2016      PT End of Session - 02/04/16 1044    Visit Number 1   PT Start Time 1005   PT Stop Time 1045   PT Time Calculation (min) 40 min   Activity Tolerance Patient tolerated treatment well   Behavior During Therapy [facility] for tasks assessed/performed      Past Medical History:  Diagnosis Date  . AF (atrial fibrillation) (HCC)    persistent afib, s/p afib ablation x 2 with subsequent convergent procedure at [facility]  . Allergic rhinitis    seasonal  . Atrial flutter ([place])   . CTS (carpal tunnel syndrome)    PMH of  . Diabetes mellitus    type 2  . Hyperlipidemia   . Hypertension   . Hypothyroidism   . Renal calculus 2005   [facility]    Past Surgical History:  Procedure Laterality Date  . ABLATION  06/2013   atypical atrial flutter ablation by Dr Lehman Prom at [facility] (mitral annular flutter ablated)  . afib 09/22/2008--11/03/2009     afib ablation x 2 by Dr Rayann Heman with subsequent convergent procedure at [facility]  . CARDIAC CATHETERIZATION  2005   non obstructive CAD  . CARDIOVERSION    . CARDIOVERSION  12/09/2010   Procedure: CARDIOVERSION;  Surgeon: Loralie Champagne, MD;  Location: [place] [facility];  Service: Cardiovascular;  Laterality: N/A;  . CARDIOVERSION  09/15/2011   Procedure: CARDIOVERSION;  Surgeon: Josue Hector, MD;  Location: [place];  Service: Cardiovascular;  Laterality: N/A;  office drawing labs wed 8/28  . CARDIOVERSION N/A 07/18/2012   Procedure: CARDIOVERSION;  Surgeon: Thayer Headings, MD;  Location: [place];  Service: Cardiovascular;  Laterality: N/A;  . CARDIOVERSION  08/2014   [place]  . COLONOSCOPY  2009   Dr Sharlett Iles  . convergent  procedure  2013   at [facility] for a fib  . JOINT REPLACEMENT Right 2008, 2012  . LOOP RECORDER IMPLANT Left 12-2010  . TONSILLECTOMY    . TOTAL HIP ARTHROPLASTY  06/2010   Depuy prosthesis replacement, Dr Alvan Dame  . TOTAL HIP ARTHROPLASTY Right 2008    Depuy;Dr Alvan Dame    There were no vitals filed for this visit.       Subjective Assessment - 02/04/16 1011    Subjective Patient reports that he had a right THR in 2008, he reports that he started having sharp pains in the right hip in the summer.  He is unsure of any reason.  Recent x-rays negative.   Limitations Standing   Patient Stated Goals have less right hip pain   Currently in Pain? Yes   Pain Score 1    Pain Location Hip   Pain Orientation Right   Pain Descriptors / Indicators Aching;Sharp;Shooting   Pain Type Acute pain   Pain Onset More than a month ago   Pain Frequency Intermittent   Aggravating Factors  getting in and out of the car pain up to 8/10   Pain Relieving Factors rest   Effect of Pain on Daily Activities difficulty getting in and out of the car            [facility] PT Assessment - 02/04/16 0001  Assessment   Medical Diagnosis right hip pain   Referring Provider Alvan Dame   Onset Date/Surgical Date 01/04/16   Prior Therapy no     Precautions   Precautions None     Balance Screen   Has the patient fallen in the past 6 months No   Has the patient had a decrease in activity level because of a fear of falling?  No   Is the patient reluctant to leave their home because of a fear of falling?  No     Home Environment   Additional Comments does some yardwork     Prior Function   Level of Independence Independent   Vocation Retired   Leisure no exercise     Posture/Postural Control   Posture Comments fwd head, rounded shoulders     ROM / Strength   AROM / PROM / Strength AROM;Strength     AROM   Overall AROM Comments WFL's a little tight with ER/IR of the right hip     Strength   Overall Strength  Comments 4/5 without increase of pain     Flexibility   Soft Tissue Assessment /Muscle Length --  very tight HS, ITB and quad/hip flexor     Palpation   Palpation comment some tightness in the right ITB and quad area                             PT Short Term Goals - 02/04/16 1047      PT SHORT TERM GOAL #1   Title independent with HEP   Period Days   Status Achieved                  Plan - 02/04/16 1045    Clinical Impression Statement Patient reports a right THR in 2008, reports that over the past year he has had some increased pain with getting in and out of the car.  he is unsure of any cause except doing some yardwork, recent x-rays were negative.  He is very tight around the hip.   Rehab Potential Good   PT Frequency One time visit   PT Treatment/Interventions ADLs/Self Care Home Management;Therapeutic exercise;Patient/family education   PT Next Visit Plan Gave patient an HEP to address the tightness of the hips, he will be out of town for 6 weeks.    Consulted and Agree with Plan of Care Patient      Patient will benefit from skilled therapeutic intervention in order to improve the following deficits and impairments:  Abnormal gait, Decreased strength, Decreased range of motion, Difficulty walking, Impaired flexibility, Pain  Visit Diagnosis: Pain in right hip - Plan: PT plan of care cert/re-cert      G-Codes - [phone]    Functional Assessment Tool Used PT discretion   Functional Limitation Mobility: Walking and moving around   Mobility: Walking and Moving Around Current Status [phone]) At least 20 percent but less than 40 percent impaired, limited or restricted   Mobility: Walking and Moving Around Goal Status [phone]) At least 20 percent but less than 40 percent impaired, limited or restricted   Mobility: Walking and Moving Around Discharge Status [phone]) At least 20 percent but less than 40 percent impaired, limited or restricted        Problem List Patient Active Problem List   Diagnosis Date Noted  . Lumbar radiculopathy 05/05/2015  . Hypercalcemia 03/23/2014  . Hypothyroidism 12/30/2010  . OSA (  obstructive sleep apnea) 09/02/2010  . Atrial fibrillation ([place]) 04/05/2009  . EDEMA 04/05/2009  . ERECTILE DYSFUNCTION 05/01/2008  . Diabetes mellitus with peripheral circulatory disorder, controlled ([place]) 09/06/2007  . Hyperlipidemia 09/06/2007  . Essential hypertension 09/06/2007  . BPH with obstruction/lower urinary tract symptoms 04/25/2007    Sumner Boast., PT 02/04/2016, 10:51 AM  [address] [place], [place], [address] Phone: [phone]   Fax:  [phone]  Name: [name] MRN: [id number] Date of Birth: [birth year]

## 2016-02-27 ENCOUNTER — Other Ambulatory Visit: Payer: Self-pay | Admitting: Internal Medicine

## 2016-02-27 DIAGNOSIS — E1151 Type 2 diabetes mellitus with diabetic peripheral angiopathy without gangrene: Secondary | ICD-10-CM

## 2016-04-11 ENCOUNTER — Encounter: Payer: Self-pay | Admitting: Nurse Practitioner

## 2016-04-11 ENCOUNTER — Ambulatory Visit (INDEPENDENT_AMBULATORY_CARE_PROVIDER_SITE_OTHER): Payer: Medicare Other | Admitting: Nurse Practitioner

## 2016-04-11 VITALS — BP 144/78 | HR 72 | Temp 98.2°F | Wt 199.0 lb

## 2016-04-11 DIAGNOSIS — J0140 Acute pansinusitis, unspecified: Secondary | ICD-10-CM | POA: Diagnosis not present

## 2016-04-11 MED ORDER — GUAIFENESIN ER 600 MG PO TB12: 600.0000 mg | ORAL_TABLET | Freq: Two times a day (BID) | ORAL | 0 refills | Status: DC | PRN

## 2016-04-11 MED ORDER — FLUTICASONE PROPIONATE 50 MCG/ACT NA SUSP: 2.0000 | Freq: Every day | NASAL | 0 refills | Status: DC

## 2016-04-11 MED ORDER — AMOXICILLIN-POT CLAVULANATE 875-125 MG PO TABS: 1.0000 | ORAL_TABLET | Freq: Two times a day (BID) | ORAL | 0 refills | Status: DC

## 2016-04-11 MED ORDER — OXYMETAZOLINE HCL 0.05 % NA SOLN: 1.0000 | Freq: Two times a day (BID) | NASAL | 0 refills | Status: DC

## 2016-04-11 NOTE — Progress Notes (Signed)
Subjective:  Patient ID: [name], male    DOB: 1945/04/03  Age: 71 y.o. MRN: [id number]  CC: Sinusitis (x 2weeks, no OTC used)   Sinusitis  This is a new problem. The current episode started 1 to 4 weeks ago. The problem is unchanged. There has been no fever. Associated symptoms include congestion, coughing, ear pain, headaches, sinus pressure and a sore throat. Pertinent negatives include no chills, diaphoresis, hoarse voice, shortness of breath or swollen glands. Past treatments include acetaminophen. The treatment provided no relief.    Outpatient Medications Prior to Visit  Medication Sig Dispense Refill  . amLODipine (NORVASC) 10 MG tablet Take 1 tablet (10 mg total) by mouth daily. --- Office visit needed for further refills 90 tablet 0  . latanoprost (XALATAN) 0.005 % ophthalmic solution Place 1 drop into the left eye at bedtime.    Marland Kitchen levothyroxine (SYNTHROID, LEVOTHROID) 50 MCG tablet Take 1 tablet (50 mcg total) by mouth daily. 90 tablet 3  . Magnesium Oxide 500 MG CAPS Take 1 capsule by mouth 2 (two) times daily.    . metFORMIN (GLUCOPHAGE-XR) 500 MG 24 hr tablet TAKE 2 TABLETS (1,000MG )   TWO TIMES A DAY WITH MEALS --- Office visit needed for further refills 360 tablet 0  . metoprolol (LOPRESSOR) 50 MG tablet Take 1 tablet (50 mg total) by mouth every 4 (four) hours as needed (Fast heart rate). 30 tablet 3  . metoprolol succinate (TOPROL-XL) 100 MG 24 hr tablet TAKE 1 TABLET TWICE A DAY  AND AN EXTRA 1/2 TABLET    DAILY AS NEEDEDFOR ATRIAL FIBRILLATION  -- Office visit needed for further refills 225 tablet 0  . quinapril (ACCUPRIL) 40 MG tablet Take 1 tablet (40 mg total) by mouth daily. 90 tablet 2  . repaglinide (PRANDIN) 1 MG tablet TAKE 1 TABLET 3 TIMES A DAYBEFORE MEALS --- Office visit needed for further refills 270 tablet 0  . rivaroxaban (XARELTO) 20 MG TABS tablet Take 1 tablet (20 mg total) by mouth daily with supper. 90 tablet 1   No facility-administered  medications prior to visit.     ROS See HPI  Objective:  BP (!) 144/78 (BP Location: Right Arm, Patient Position: Sitting, Cuff Size: Normal)   Pulse 72   Temp 98.2 F (36.8 C) (Oral)   Wt 199 lb (90.3 kg)   SpO2 98%   BMI 30.26 kg/m   BP Readings from Last 3 Encounters:  04/11/16 (!) 144/78  01/21/16 (!) 162/72  09/28/15 124/74    Wt Readings from Last 3 Encounters:  04/11/16 199 lb (90.3 kg)  01/21/16 202 lb (91.6 kg)  09/28/15 201 lb (91.2 kg)    Physical Exam  Constitutional: He is oriented to person, place, and time. No distress.  HENT:  Right Ear: External ear and ear canal normal. Tympanic membrane is not injected and not erythematous. A middle ear effusion is present.  Left Ear: Ear canal normal. Tympanic membrane is not injected and not erythematous. A middle ear effusion is present.  Nose: Mucosal edema and rhinorrhea present. Right sinus exhibits maxillary sinus tenderness and frontal sinus tenderness. Left sinus exhibits maxillary sinus tenderness and frontal sinus tenderness.  Mouth/Throat: Uvula is midline. Posterior oropharyngeal erythema present. No oropharyngeal exudate.  Eyes: No scleral icterus.  Neck: Normal range of motion. Neck supple.  Cardiovascular: Normal rate and regular rhythm.   Pulmonary/Chest: Effort normal and breath sounds normal.  Lymphadenopathy:    He has no cervical adenopathy.  Neurological: He is alert and oriented to person, place, and time.  Vitals reviewed.   Lab Results  Component Value Date   WBC 7.1 07/28/2015   HGB 15.0 07/28/2015   HCT 42.8 07/28/2015   PLT 218 07/28/2015   GLUCOSE 147 (H) 09/28/2015   CHOL 160 09/28/2015   TRIG 174.0 (H) 09/28/2015   HDL 41.30 09/28/2015   LDLCALC 84 09/28/2015   ALT 25 09/28/2015   AST 19 09/28/2015   NA 142 09/28/2015   K 4.8 09/28/2015   CL 105 09/28/2015   CREATININE 0.95 09/28/2015   BUN 18 09/28/2015   CO2 29 09/28/2015   TSH 4.56 (H) 09/28/2015   PSA 0.76  04/25/2007   INR 2.6 09/26/2010   HGBA1C 7.6 (H) 09/28/2015   MICROALBUR 22.9 (H) 01/27/2015    Nm Bone Scan 3 Phase  Result Date: 12/08/2015 CLINICAL DATA:  Aftercare following RIGHT hip joint replacement surgery, RIGHT hip pain radiating down leg, history of RIGHT hip arthroplasty in 2008 revised in 2013 EXAM: NUCLEAR MEDICINE 3-PHASE BONE SCAN TECHNIQUE: Radionuclide angiographic images, immediate static blood pool images, and 3-hour delayed static images were obtained of the hips after intravenous injection of radiopharmaceutical. RADIOPHARMACEUTICALS:  26.2 mCi Tc-60m MDP IV COMPARISON:  None Radiographic correlation:  06/27/2010 FINDINGS: Vascular phase: Symmetric blood flow to both hips Blood pool phase: Normal blood pool at both hips Delayed phase: Photopenic defect RIGHT hip corresponding to prosthetic hardware. No abnormal tracer localization identified at the RIGHT hip prosthetic components to suggest loosening or infection. Normal osseous tracer distribution identified. Minimal urinary contamination at the perineum. IMPRESSION: RIGHT hip prosthesis. Otherwise normal three-phase bone scintigraphy of the hips. Electronically Signed   By: Lavonia Dana M.D.   On: 12/08/2015 07:53    Assessment & Plan:   Jeffrey was seen today for sinusitis.  Diagnoses and all orders for this visit:  Acute non-recurrent pansinusitis -     guaiFENesin (MUCINEX) 600 MG 12 hr tablet; Take 1 tablet (600 mg total) by mouth 2 (two) times daily as needed for cough or to loosen phlegm. -     fluticasone (FLONASE) 50 MCG/ACT nasal spray; Place 2 sprays into both nostrils daily. -     oxymetazoline (AFRIN NASAL SPRAY) 0.05 % nasal spray; Place 1 spray into both nostrils 2 (two) times daily. Use only for 3days, then stop -     amoxicillin-clavulanate (AUGMENTIN) 875-125 MG tablet; Take 1 tablet by mouth 2 (two) times daily.   I am having [name] start on guaiFENesin, fluticasone, oxymetazoline, and  amoxicillin-clavulanate. I am also having him maintain his latanoprost, Magnesium Oxide, metoprolol, levothyroxine, quinapril, rivaroxaban, metFORMIN, repaglinide, metoprolol succinate, and amLODipine.  Meds ordered this encounter  Medications  . guaiFENesin (MUCINEX) 600 MG 12 hr tablet    Sig: Take 1 tablet (600 mg total) by mouth 2 (two) times daily as needed for cough or to loosen phlegm.    Dispense:  14 tablet    Refill:  0    Order Specific Question:   Supervising Provider    Answer:   Cassandria Anger [[id number]]  . fluticasone (FLONASE) 50 MCG/ACT nasal spray    Sig: Place 2 sprays into both nostrils daily.    Dispense:  16 g    Refill:  0    Order Specific Question:   Supervising Provider    Answer:   Cassandria Anger [[id number]]  . oxymetazoline (AFRIN NASAL SPRAY) 0.05 % nasal spray    Sig: Place  1 spray into both nostrils 2 (two) times daily. Use only for 3days, then stop    Dispense:  30 mL    Refill:  0    Order Specific Question:   Supervising Provider    Answer:   Cassandria Anger [[id number]]  . amoxicillin-clavulanate (AUGMENTIN) 875-125 MG tablet    Sig: Take 1 tablet by mouth 2 (two) times daily.    Dispense:  14 tablet    Refill:  0    Order Specific Question:   Supervising Provider    Answer:   Cassandria Anger [[id number]]    Follow-up: Return if symptoms worsen or fail to improve.  Wilfred Lacy, NP

## 2016-04-11 NOTE — Patient Instructions (Signed)
URI Instructions: Flonase and Afrin use: apply 1spray of afrin in each nare, wait 66mins, then apply 2sprays of flonase in each nare. Use both nasal spray consecutively x 3days, then flonase only for at least 14days.  Encourage adequate oral hydration.  Use over-the-counter  "cold" medicines  such as "Tylenol cold" , "Advil cold",  "Mucinex"  for cough and congestion.  Avoid decongestants if you have high blood pressure. Use" Delsym" or" Robitussin" cough syrup varietis for cough.  You can use plain "Tylenol" or "Advi"l for fever, chills and achyness.

## 2016-04-19 DIAGNOSIS — H4052X1 Glaucoma secondary to other eye disorders, left eye, mild stage: Secondary | ICD-10-CM | POA: Diagnosis not present

## 2016-04-24 NOTE — Patient Instructions (Addendum)

## 2016-04-24 NOTE — Progress Notes (Signed)
Subjective:    Patient ID: [name], male    DOB: [birth year], 71 y.o.   MRN: [id number]  HPI The patient is here for follow up.  Diabetes: He is taking his medication every morning, but sometimes forgets his evening dose of metformin.  He often forgets the the lunch dose of the prandin. He has not been as compliant with a diabetic diet. He is not exercising regularly. He monitors his sugars and they have been running 144-185. He checks his feet daily and denies foot lesions. He is up-to-date with an ophthalmology examination.   Hypertension: He is taking his medication daily. He is fairly compliant with a low sodium diet.  He denies chest pain, edema, shortness of breath and regular headaches. He is exercising regularly.  He does not monitor his blood pressure at home.    Afib:  He is taking xarelto daily.  He has occasional palpitations - he takes an extra dose of metoprolol if needed - he sometimes takes it preventatively.   Hypothyroidism:  He is taking his medication daily.  He denies any recent changes in energy or weight that are unexplained.   Hyperlipidemia: He is taking his medication daily. He is compliant with a low fat/cholesterol diet. He is not exercising regularly. He denies myalgias.    Medications and allergies reviewed with patient and updated if appropriate.  Patient Active Problem List   Diagnosis Date Noted  . Lumbar radiculopathy 05/05/2015  . Hypercalcemia 03/23/2014  . Hypothyroidism 12/30/2010  . OSA (obstructive sleep apnea) 09/02/2010  . Atrial fibrillation ([place]) 04/05/2009  . EDEMA 04/05/2009  . ERECTILE DYSFUNCTION 05/01/2008  . Diabetes mellitus with peripheral circulatory disorder, controlled ([place]) 09/06/2007  . Hyperlipidemia 09/06/2007  . Essential hypertension 09/06/2007  . 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  amLODipine (NORVASC) 10 MG tablet Take 1 tablet (10 mg total) by mouth daily. --- Office visit needed for further refills 90 tablet 0  . fluticasone (FLONASE) 50 MCG/ACT nasal spray Place 2 sprays into both nostrils daily. 16 g 0  . guaiFENesin (MUCINEX) 600 MG 12 hr tablet Take 1 tablet (600 mg total) by mouth 2 (two) times daily as needed for cough or to loosen phlegm. 14 tablet 0  . latanoprost (XALATAN) 0.005 % ophthalmic solution Place 1 drop into the left eye at bedtime.    Marland Kitchen levothyroxine (SYNTHROID, LEVOTHROID) 50 MCG tablet Take 1 tablet (50 mcg total) by mouth daily. 90 tablet 3  . Magnesium Oxide 500 MG CAPS Take 1 capsule by mouth 2 (two) times daily.    . metFORMIN (GLUCOPHAGE-XR) 500 MG 24 hr tablet TAKE 2 TABLETS (1,000MG )   TWO TIMES A DAY WITH MEALS --- Office visit needed for further refills 360 tablet 0  . metoprolol (LOPRESSOR) 50 MG tablet Take 1 tablet (50 mg total) by mouth every 4 (four) hours as needed (Fast heart rate). 30 tablet 3  . metoprolol succinate (TOPROL-XL) 100 MG 24 hr tablet TAKE 1 TABLET TWICE A DAY  AND AN EXTRA 1/2 TABLET    DAILY AS NEEDEDFOR ATRIAL FIBRILLATION  -- Office visit needed for further refills 225 tablet 0  . oxymetazoline (AFRIN NASAL SPRAY) 0.05 % nasal spray Place 1 spray into both nostrils 2 (two) times daily. Use only for 3days, then stop 30 mL 0  . quinapril (ACCUPRIL) 40 MG tablet Take 1 tablet (40  mg total) by mouth daily. 90 tablet 2  . repaglinide (PRANDIN) 1 MG tablet TAKE 1 TABLET 3 TIMES A DAYBEFORE MEALS --- Office visit needed for further refills 270 tablet 0  . rivaroxaban (XARELTO) 20 MG TABS tablet Take 1 tablet (20 mg total) by mouth daily with supper. 90 tablet 1   No current facility-administered medications on file prior to visit.     Past Medical History:  Diagnosis Date  . AF (atrial fibrillation) (HCC)    persistent afib, s/p afib ablation x 2 with subsequent convergent procedure at [facility]  . Allergic rhinitis     seasonal  . Atrial flutter ([place])   . CTS (carpal tunnel syndrome)    PMH of  . Diabetes mellitus    type 2  . Hyperlipidemia   . Hypertension   . Hypothyroidism   . Renal calculus 2005   [facility]    Past Surgical History:  Procedure Laterality Date  . ABLATION  06/2013   atypical atrial flutter ablation by Dr Lehman Prom at [facility] (mitral annular flutter ablated)  . afib 09/22/2008--11/03/2009     afib ablation x 2 by Dr Rayann Heman with subsequent convergent procedure at [facility]  . CARDIAC CATHETERIZATION  2005   non obstructive CAD  . CARDIOVERSION    . CARDIOVERSION  12/09/2010   Procedure: CARDIOVERSION;  Surgeon: Loralie Champagne, MD;  Location: [place];  Service: Cardiovascular;  Laterality: N/A;  . CARDIOVERSION  09/15/2011   Procedure: CARDIOVERSION;  Surgeon: Josue Hector, MD;  Location: [place];  Service: Cardiovascular;  Laterality: N/A;  office drawing labs wed 8/28  . CARDIOVERSION N/A 07/18/2012   Procedure: CARDIOVERSION;  Surgeon: Thayer Headings, MD;  Location: [place];  Service: Cardiovascular;  Laterality: N/A;  . CARDIOVERSION  08/2014   [place]  . COLONOSCOPY  2009   Dr Sharlett Iles  . convergent procedure  2013   at [facility] for a fib  . JOINT REPLACEMENT Right 2008, 2012  . LOOP RECORDER IMPLANT Left 12-2010  . TONSILLECTOMY    . TOTAL HIP ARTHROPLASTY  06/2010   Depuy prosthesis replacement, Dr Alvan Dame  . TOTAL HIP ARTHROPLASTY Right 2008    Depuy;Dr Alvan Dame    Social History   Social History  . Marital status: Married    Spouse name: Izora Gala  . Number of children: Y  . Years of education: N/A   Occupational History  . BUYER [employer]   Social History Main Topics  . Smoking status: Former Smoker    Packs/day: 0.90    Years: 8.00    Types: Cigarettes    Quit date: 01/16/1974  . Smokeless tobacco: Never Used     Comment: smoked  1966- 1976,up to 1 ppd  . Alcohol use 0.0 oz/week     Comment: Socially; "sips socially"( essentially none)  .  Drug use: No  . Sexual activity: Not on file   Other Topics Concern  . Not on file   Social History Narrative  . No narrative on file    Family History  Problem Relation Age of Onset  . Lung cancer Mother     non smoker  . Stroke Mother 26  . Diabetes Father   . Stroke Father     mid 19s  . Testicular cancer Brother   . Allergies Maternal Grandmother   . Cancer Maternal Grandmother     unsure  . Allergies Brother   . Heart attack Neg Hx  Review of Systems  Constitutional: Negative for chills and fever.  HENT: Negative for congestion, ear discharge, ear pain, sinus pain, sinus pressure and sore throat.   Respiratory: Negative for cough, shortness of breath and wheezing.   Cardiovascular: Positive for palpitations (occasional). Negative for chest pain and leg swelling.  Neurological: Negative for dizziness, light-headedness and headaches.       Objective:   Vitals:   04/25/16 0847  BP: (!) 144/78  Pulse: 82  Resp: 16  Temp: 97.8 F (36.6 C)   Wt Readings from Last 3 Encounters:  04/25/16 201 lb (91.2 kg)  04/11/16 199 lb (90.3 kg)  01/21/16 202 lb (91.6 kg)   Body mass index is 30.56 kg/m.   Physical Exam    Constitutional: Appears well-developed and well-nourished. No distress.  HENT:  Head: Normocephalic and atraumatic.  Neck: Neck supple. No tracheal deviation present. No thyromegaly present.  No cervical lymphadenopathy Cardiovascular: Normal rate, regular rhythm and normal heart sounds.   No murmur heard. No carotid bruit .  No edema Pulmonary/Chest: Effort normal and breath sounds normal. No respiratory distress. No has no wheezes. No rales.  Skin: Skin is warm and dry. Not diaphoretic.  Psychiatric: Normal mood and affect. Behavior is normal.      Assessment & Plan:    See Problem List for Assessment and Plan of chronic medical problems.

## 2016-04-25 ENCOUNTER — Other Ambulatory Visit (INDEPENDENT_AMBULATORY_CARE_PROVIDER_SITE_OTHER): Payer: Medicare Other

## 2016-04-25 ENCOUNTER — Encounter: Payer: Self-pay | Admitting: Internal Medicine

## 2016-04-25 ENCOUNTER — Ambulatory Visit (INDEPENDENT_AMBULATORY_CARE_PROVIDER_SITE_OTHER): Payer: Medicare Other | Admitting: Internal Medicine

## 2016-04-25 VITALS — BP 144/78 | HR 82 | Temp 97.8°F | Resp 16 | Ht 68.0 in | Wt 201.0 lb

## 2016-04-25 DIAGNOSIS — I10 Essential (primary) hypertension: Secondary | ICD-10-CM

## 2016-04-25 DIAGNOSIS — E039 Hypothyroidism, unspecified: Secondary | ICD-10-CM

## 2016-04-25 DIAGNOSIS — E1151 Type 2 diabetes mellitus with diabetic peripheral angiopathy without gangrene: Secondary | ICD-10-CM

## 2016-04-25 DIAGNOSIS — I481 Persistent atrial fibrillation: Secondary | ICD-10-CM

## 2016-04-25 DIAGNOSIS — E7800 Pure hypercholesterolemia, unspecified: Secondary | ICD-10-CM | POA: Diagnosis not present

## 2016-04-25 DIAGNOSIS — I4819 Other persistent atrial fibrillation: Secondary | ICD-10-CM

## 2016-04-25 LAB — LIPID PANEL
Cholesterol: 161 mg/dL (ref 0–200)
HDL: 45 mg/dL (ref >39.00)
LDL Cholesterol: 78 mg/dL (ref 0–99)
NonHDL: 116.09
Total CHOL/HDL Ratio: 4
Triglycerides: 192 mg/dL — ABNORMAL HIGH (ref 0.0–149.0)
VLDL: 38.4 mg/dL (ref 0.0–40.0)

## 2016-04-25 LAB — HEMOGLOBIN A1C: Hgb A1c MFr Bld: 8.5 % — ABNORMAL HIGH (ref 4.6–6.5)

## 2016-04-25 LAB — COMPREHENSIVE METABOLIC PANEL
ALT: 28 U/L (ref 0–53)
AST: 23 U/L (ref 0–37)
Albumin: 4.5 g/dL (ref 3.5–5.2)
Alkaline Phosphatase: 43 U/L (ref 39–117)
BUN: 18 mg/dL (ref 6–23)
CO2: 26 mEq/L (ref 19–32)
Calcium: 9.7 mg/dL (ref 8.4–10.5)
Chloride: 102 mEq/L (ref 96–112)
Creatinine, Ser: 0.91 mg/dL (ref 0.40–1.50)
GFR: 87.67 mL/min (ref >60.00)
Glucose, Bld: 176 mg/dL — ABNORMAL HIGH (ref 70–99)
Potassium: 4.7 mEq/L (ref 3.5–5.1)
Sodium: 138 mEq/L (ref 135–145)
Total Bilirubin: 0.4 mg/dL (ref 0.2–1.2)
Total Protein: 7.5 g/dL (ref 6.0–8.3)

## 2016-04-25 LAB — TSH: TSH: 5.14 u[IU]/mL — ABNORMAL HIGH (ref 0.35–4.50)

## 2016-04-25 NOTE — Assessment & Plan Note (Signed)
Check tsh  Titrate med dose if needed  

## 2016-04-25 NOTE — Progress Notes (Signed)
Pre visit review using our clinic review tool, if applicable. No additional management support is needed unless otherwise documented below in the visit note. 

## 2016-04-25 NOTE — Assessment & Plan Note (Signed)
BP on high side today Advised him to monitor at home - goal less than 140/90 minimum, ideally lower Continue current medications

## 2016-04-25 NOTE — Assessment & Plan Note (Signed)
Check lipid panel  Continue daily statin Regular exercise and healthy diet encouraged  

## 2016-04-25 NOTE — Assessment & Plan Note (Signed)
Check a1c Low sugar / carb diet stressed Stressed regular exercise, weight loss

## 2016-04-25 NOTE — Assessment & Plan Note (Signed)
Controlled with as needed metoprolol Taking xarelto daily Following with cardiology

## 2016-04-30 ENCOUNTER — Telehealth: Payer: Self-pay | Admitting: Internal Medicine

## 2016-04-30 DIAGNOSIS — E1151 Type 2 diabetes mellitus with diabetic peripheral angiopathy without gangrene: Secondary | ICD-10-CM

## 2016-04-30 DIAGNOSIS — E039 Hypothyroidism, unspecified: Secondary | ICD-10-CM

## 2016-04-30 DIAGNOSIS — E7800 Pure hypercholesterolemia, unspecified: Secondary | ICD-10-CM

## 2016-04-30 DIAGNOSIS — I10 Essential (primary) hypertension: Secondary | ICD-10-CM

## 2016-04-30 NOTE — Telephone Encounter (Signed)
Regarding labs:  Kidney function, liver tests normal.  Cholesterol is good. Thyroid function is too low - increase levothyroxine to 75 mcg daily. Recheck tsh in 8 weeks.  Sugars are poorly controlled we need to add an additional medication - start januvia 100 mg daily if he agrees.  Increase exercise, lose weight, improve diet.  We will recheck a1c in 8 weeks as well.     All orders pending.

## 2016-05-02 NOTE — Telephone Encounter (Signed)
LVM with pt informing him of results. Asked that pt call back in regards to starting a new diabetic medication.

## 2016-05-11 ENCOUNTER — Encounter: Payer: Self-pay | Admitting: Emergency Medicine

## 2016-05-11 MED ORDER — LEVOTHYROXINE SODIUM 75 MCG PO TABS: 75.0000 ug | ORAL_TABLET | Freq: Every day | ORAL | 1 refills | Status: DC

## 2016-05-11 MED ORDER — SITAGLIPTIN PHOSPHATE 100 MG PO TABS: 100.0000 mg | ORAL_TABLET | Freq: Every day | ORAL | 1 refills | Status: DC

## 2016-05-11 NOTE — Telephone Encounter (Signed)
Attempted to contact pt on mobile number and wifes number, both went straight to VM. Mailing a letter to pt statins changes and sending RXs to pharmacy.

## 2016-05-16 NOTE — Telephone Encounter (Signed)
[place] and called today. Stated that he would like to start with life style changes rather than starting the new medication. He stated that he would send a few questions through his wife tomorrow but I informed him that it would be better to discuss over the phone rather than his wifes appt. States that if the lifestyle changes do not help that he would start the medication.

## 2016-05-16 NOTE — Telephone Encounter (Signed)
Ok- he can try lifestyle changes - have him make an appt in 3 months.  Blood work has been ordered -  See if he can come a few days prior so we can discuss the blood work at his visit.   He needs to be decreasing his carbs/sugars, exercising regularly and working on weight loss.  I can refer to a nutritionist if he wants.

## 2016-05-17 NOTE — Telephone Encounter (Signed)
Spoke with pts wife at her appt today to inform. She will relay information to pt.

## 2016-07-10 ENCOUNTER — Telehealth: Payer: Self-pay | Admitting: Internal Medicine

## 2016-07-10 ENCOUNTER — Other Ambulatory Visit: Payer: Self-pay | Admitting: Internal Medicine

## 2016-07-10 NOTE — Telephone Encounter (Signed)
Pt would like to know if you have received paperwork from [place] about his diabetic supplies.

## 2016-07-10 NOTE — Telephone Encounter (Signed)
Spoke with pt to inform paperwork was faxed to [place].

## 2016-07-11 ENCOUNTER — Telehealth: Payer: Self-pay

## 2016-07-11 ENCOUNTER — Encounter: Payer: Self-pay | Admitting: Internal Medicine

## 2016-07-11 ENCOUNTER — Other Ambulatory Visit: Payer: Self-pay | Admitting: Internal Medicine

## 2016-07-11 DIAGNOSIS — E1151 Type 2 diabetes mellitus with diabetic peripheral angiopathy without gangrene: Secondary | ICD-10-CM

## 2016-07-11 NOTE — Telephone Encounter (Signed)
Patient is needing a refill on his xarelto is this ok to do

## 2016-07-12 ENCOUNTER — Other Ambulatory Visit: Payer: Self-pay | Admitting: Emergency Medicine

## 2016-07-12 MED ORDER — LEVOTHYROXINE SODIUM 75 MCG PO TABS: 75.0000 ug | ORAL_TABLET | Freq: Every day | ORAL | 1 refills | Status: DC

## 2016-07-12 NOTE — Telephone Encounter (Signed)
Please advise on sig 

## 2016-07-14 ENCOUNTER — Encounter: Payer: Self-pay | Admitting: Internal Medicine

## 2016-07-17 ENCOUNTER — Other Ambulatory Visit: Payer: Self-pay | Admitting: *Deleted

## 2016-07-21 ENCOUNTER — Telehealth: Payer: Self-pay

## 2016-07-21 ENCOUNTER — Other Ambulatory Visit: Payer: Self-pay | Admitting: *Deleted

## 2016-07-21 ENCOUNTER — Telehealth: Payer: Self-pay | Admitting: Internal Medicine

## 2016-07-21 NOTE — Telephone Encounter (Signed)
Pt was forwarded to my VM and I called him back to let him know that Geni Bers. Was out of the office until Monday. He stated that [facility] have received the diabetic paper work but without a signature from Dr. Quay Burow and a date. I advised that I will let Geni Bers. Know and she will call him back on Monday when she could. Pt understood.

## 2016-07-21 NOTE — Telephone Encounter (Signed)
Pt would like a call back regarding paperwork for diabetic supplies, states it has been faxed several times and it gets faxed back without a signature

## 2016-07-24 NOTE — Telephone Encounter (Signed)
Order form has been faxed to [facility] again.

## 2016-07-24 NOTE — Telephone Encounter (Signed)
Spoke with pt to inform that order form has been faxed again.

## 2016-08-23 ENCOUNTER — Other Ambulatory Visit (INDEPENDENT_AMBULATORY_CARE_PROVIDER_SITE_OTHER): Payer: Medicare Other

## 2016-08-23 DIAGNOSIS — E039 Hypothyroidism, unspecified: Secondary | ICD-10-CM | POA: Diagnosis not present

## 2016-08-23 DIAGNOSIS — I10 Essential (primary) hypertension: Secondary | ICD-10-CM

## 2016-08-23 DIAGNOSIS — E7800 Pure hypercholesterolemia, unspecified: Secondary | ICD-10-CM | POA: Diagnosis not present

## 2016-08-23 DIAGNOSIS — E1151 Type 2 diabetes mellitus with diabetic peripheral angiopathy without gangrene: Secondary | ICD-10-CM | POA: Diagnosis not present

## 2016-08-23 LAB — LIPID PANEL
Cholesterol: 174 mg/dL (ref 0–200)
HDL: 45.9 mg/dL (ref >39.00)
NonHDL: 128.01
Total CHOL/HDL Ratio: 4
Triglycerides: 210 mg/dL — ABNORMAL HIGH (ref 0.0–149.0)
VLDL: 42 mg/dL — ABNORMAL HIGH (ref 0.0–40.0)

## 2016-08-23 LAB — COMPREHENSIVE METABOLIC PANEL
ALT: 33 U/L (ref 0–53)
AST: 26 U/L (ref 0–37)
Albumin: 4.7 g/dL (ref 3.5–5.2)
Alkaline Phosphatase: 50 U/L (ref 39–117)
BUN: 19 mg/dL (ref 6–23)
CO2: 30 mEq/L (ref 19–32)
Calcium: 10.4 mg/dL (ref 8.4–10.5)
Chloride: 100 mEq/L (ref 96–112)
Creatinine, Ser: 1.09 mg/dL (ref 0.40–1.50)
GFR: 71.11 mL/min (ref >60.00)
Glucose, Bld: 189 mg/dL — ABNORMAL HIGH (ref 70–99)
Potassium: 5.4 mEq/L — ABNORMAL HIGH (ref 3.5–5.1)
Sodium: 137 mEq/L (ref 135–145)
Total Bilirubin: 0.6 mg/dL (ref 0.2–1.2)
Total Protein: 8 g/dL (ref 6.0–8.3)

## 2016-08-23 LAB — LDL CHOLESTEROL, DIRECT: Direct LDL: 91 mg/dL

## 2016-08-23 LAB — HEMOGLOBIN A1C: Hgb A1c MFr Bld: 8 % — ABNORMAL HIGH (ref 4.6–6.5)

## 2016-08-23 LAB — TSH: TSH: 3.25 u[IU]/mL (ref 0.35–4.50)

## 2016-08-29 ENCOUNTER — Ambulatory Visit (INDEPENDENT_AMBULATORY_CARE_PROVIDER_SITE_OTHER): Payer: Medicare Other | Admitting: Internal Medicine

## 2016-08-29 ENCOUNTER — Encounter: Payer: Self-pay | Admitting: Internal Medicine

## 2016-08-29 VITALS — BP 162/80 | HR 74 | Temp 97.7°F | Resp 16 | Wt 195.0 lb

## 2016-08-29 DIAGNOSIS — E119 Type 2 diabetes mellitus without complications: Secondary | ICD-10-CM

## 2016-08-29 DIAGNOSIS — I10 Essential (primary) hypertension: Secondary | ICD-10-CM | POA: Diagnosis not present

## 2016-08-29 DIAGNOSIS — E7800 Pure hypercholesterolemia, unspecified: Secondary | ICD-10-CM

## 2016-08-29 DIAGNOSIS — E039 Hypothyroidism, unspecified: Secondary | ICD-10-CM | POA: Diagnosis not present

## 2016-08-29 MED ORDER — GLIMEPIRIDE 2 MG PO TABS: 2.0000 mg | ORAL_TABLET | Freq: Every day | ORAL | 3 refills | Status: DC

## 2016-08-29 MED ORDER — ATORVASTATIN CALCIUM 10 MG PO TABS: 10.0000 mg | ORAL_TABLET | Freq: Every day | ORAL | 3 refills | Status: DC

## 2016-08-29 NOTE — Progress Notes (Signed)
Subjective:    Patient ID: [name], male    DOB: April 24, 1945, 71 y.o.   MRN: [id number]  HPI The patient is here for follow up.  Diabetes: He is taking his medication daily as prescribed. He is compliant with a diabetic diet. He is exercising regularly - walking 1.5 miles. He has lost some weight.  He monitors his sugars and they have been running averaging 142, 146, 152, 154, 149, 134, 156. He checks his feet daily and denies foot lesions. He is up-to-date with an ophthalmology examination.   Afib, Hypertension: He is taking his medication daily. He is compliant with a low sodium diet.  He denies chest pain, palpitations, edema, shortness of breath and regular headaches. He is exercising regularly - walking.  He does monitor his blood pressure at home - 140/70's.  His BP tends to be high when he comes in here.    Hypothyroidism:  He is taking his medication daily.  He denies any recent changes in energy or weight that are unexplained.    Medications and allergies reviewed with patient and updated if appropriate.  Patient Active Problem List   Diagnosis Date Noted  . Lumbar radiculopathy 05/05/2015  . Atypical atrial flutter ([place]) 09/12/2014  . Obesity 06/25/2013  . Hypothyroidism 12/30/2010  . OSA (obstructive sleep apnea) 09/02/2010  . Atrial fibrillation ([place]) 04/05/2009  . ERECTILE DYSFUNCTION 05/01/2008  . Type II diabetes mellitus ([place]) 09/06/2007  . Hyperlipidemia 09/06/2007  . Hypertension 09/06/2007  . 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 amLODipine (NORVASC) 10 MG tablet Take 1 tablet (10 mg total) by mouth daily. 90 tablet 1  . guaiFENesin (MUCINEX) 600 MG 12 hr tablet Take 1 tablet (600 mg total) by mouth 2 (two) times daily as needed for cough or to loosen phlegm. 14 tablet 0  . latanoprost (XALATAN) 0.005 % ophthalmic solution Place 1  drop into the left eye at bedtime.    Marland Kitchen levothyroxine (SYNTHROID, LEVOTHROID) 75 MCG tablet Take 1 tablet (75 mcg total) by mouth daily. 90 tablet 1  . Magnesium Oxide 500 MG CAPS Take 1 capsule by mouth 2 (two) times daily.    . metFORMIN (GLUCOPHAGE-XR) 500 MG 24 hr tablet TAKE 2 TABLETS (1,000MG )   TWO TIMES A DAY WITH MEALS 360 tablet 1  . metoprolol succinate (TOPROL-XL) 100 MG 24 hr tablet FOR DIRECTIONS ON HOW TO   TAKE THIS MEDICINE, READ   THE ENCLOSED MEDICATION    INFORMATION FORM 225 tablet 0  . metoprolol tartrate (LOPRESSOR) 50 MG tablet TAKE 1 TABLET EVERY 4 HOURSAS NEEDED (FAST HEART RATE) 30 tablet 3  . quinapril (ACCUPRIL) 40 MG tablet Take 1 tablet (40 mg total) by mouth daily. 90 tablet 2  . repaglinide (PRANDIN) 1 MG tablet Take 1 tablet (1 mg total) by mouth 3 (three) times daily before meals. 270 tablet 1  . sitaGLIPtin (JANUVIA) 100 MG tablet Take 1 tablet (100 mg total) by mouth daily. 90 tablet 1  . XARELTO 20 MG TABS tablet TAKE 1 TABLET DAILY WITH   SUPPER 180 tablet 0   No current facility-administered medications on file prior to visit.     Past Medical History:  Diagnosis Date  . AF (atrial fibrillation) (HCC)    persistent afib, s/p afib ablation x 2 with subsequent convergent procedure at [facility]  . Allergic rhinitis  seasonal  . Atrial flutter ([place])   . CTS (carpal tunnel syndrome)    PMH of  . Diabetes mellitus    type 2  . Hyperlipidemia   . Hypertension   . Hypothyroidism   . Renal calculus 2005   [facility]    Past Surgical History:  Procedure Laterality Date  . ABLATION  06/2013   atypical atrial flutter ablation by Dr Lehman Prom at [facility] (mitral annular flutter ablated)  . afib 09/22/2008--11/03/2009     afib ablation x 2 by Dr Rayann Heman with subsequent convergent procedure at [facility]  . CARDIAC CATHETERIZATION  2005   non obstructive CAD  . CARDIOVERSION    . CARDIOVERSION  12/09/2010   Procedure: CARDIOVERSION;  Surgeon: Loralie Champagne, MD;   Location: [place];  Service: Cardiovascular;  Laterality: N/A;  . CARDIOVERSION  09/15/2011   Procedure: CARDIOVERSION;  Surgeon: Josue Hector, MD;  Location: [place];  Service: Cardiovascular;  Laterality: N/A;  office drawing labs wed 8/28  . CARDIOVERSION N/A 07/18/2012   Procedure: CARDIOVERSION;  Surgeon: Thayer Headings, MD;  Location: [place];  Service: Cardiovascular;  Laterality: N/A;  . CARDIOVERSION  08/2014   [place]  . COLONOSCOPY  2009   Dr Sharlett Iles  . convergent procedure  2013   at [facility] for a fib  . JOINT REPLACEMENT Right 2008, 2012  . LOOP RECORDER IMPLANT Left 12-2010  . TONSILLECTOMY    . TOTAL HIP ARTHROPLASTY  06/2010   Depuy prosthesis replacement, Dr Alvan Dame  . TOTAL HIP ARTHROPLASTY Right 2008    Depuy;Dr Alvan Dame    Social History   Social History  . Marital status: Married    Spouse name: Izora Gala  . Number of children: Y  . Years of education: N/A   Occupational History  . BUYER [employer]   Social History Main Topics  . Smoking status: Former Smoker    Packs/day: 0.90    Years: 8.00    Types: Cigarettes    Quit date: 01/16/1974  . Smokeless tobacco: Never Used     Comment: smoked  1966- 1976,up to 1 ppd  . Alcohol use 0.0 oz/week     Comment: Socially; "sips socially"( essentially none)  . Drug use: No  . Sexual activity: Not on file   Other Topics Concern  . Not on file   Social History Narrative  . No narrative on file    Family History  Problem Relation Age of Onset  . Lung cancer Mother        non smoker  . Stroke Mother 53  . Diabetes Father   . Stroke Father        mid 27s  . Testicular cancer Brother   . Allergies Maternal Grandmother   . Cancer Maternal Grandmother        unsure  . Allergies Brother   . Heart attack Neg Hx     Review of Systems  Constitutional: Negative for chills, fatigue and fever.  Respiratory: Negative for cough, shortness of breath and wheezing.   Cardiovascular: Positive for  palpitations (rare). Negative for chest pain and leg swelling.  Neurological: Negative for light-headedness and headaches.       Objective:   Vitals:   08/29/16 0836  BP: (!) 162/80  Pulse: 74  Resp: 16  Temp: 97.7 F (36.5 C)  SpO2: 98%   Wt Readings from Last 3 Encounters:  08/29/16 195 lb (88.5 kg)  04/25/16 201 lb (91.2 kg)  04/11/16 199 lb (90.3 kg)   Body mass index is 29.65 kg/m.   Physical Exam    Constitutional: Appears well-developed and well-nourished. No distress.  HENT:  Head: Normocephalic and atraumatic.  Neck: Neck supple. No tracheal deviation present. No thyromegaly present.  No cervical lymphadenopathy Cardiovascular: Normal rate, regular rhythm and normal heart sounds.   No murmur heard. No carotid bruit .  No edema Pulmonary/Chest: Effort normal and breath sounds normal. No respiratory distress. No has no wheezes. No rales.  Skin: Skin is warm and dry. Not diaphoretic.  Psychiatric: Normal mood and affect. Behavior is normal.   Diabetic Foot Exam - Simple   Simple Foot Form Diabetic Foot exam was performed with the following findings:  Yes 08/29/2016  9:01 AM  Visual Inspection No deformities, no ulcerations, no other skin breakdown bilaterally:  Yes Sensation Testing Intact to touch and monofilament testing bilaterally:  Yes Pulse Check Posterior Tibialis and Dorsalis pulse intact bilaterally:  Yes Comments       Assessment & Plan:    See Problem List for Assessment and Plan of chronic medical problems.

## 2016-08-29 NOTE — Assessment & Plan Note (Addendum)
Not on medication Will start lipitor given his diabetes Continue walking Continue to work on weight loss Low fat/cholesterol diet

## 2016-08-29 NOTE — Assessment & Plan Note (Signed)
Check tsh  Titrate med dose if needed  

## 2016-08-29 NOTE — Patient Instructions (Addendum)
    Medications reviewed and updated.  Changes include starting glimepiride 2 mg daily with breakfast.  Start atorvastatin for your cholesterol.    Your prescription(s) have been submitted to your pharmacy. Please take as directed and contact our office if you believe you are having problem(s) with the medication(s).    Please followup in 4 months

## 2016-08-29 NOTE — Assessment & Plan Note (Signed)
Well controlled at home, always elevated here Continue current medications

## 2016-08-29 NOTE — Assessment & Plan Note (Signed)
Lab Results  Component Value Date   HGBA1C 8.0 (H) 08/23/2016   Not ideally controlled Continue walking Continue weight loss efforts Continue metformin and trandin Add glimepiride - other meds not covered by insurance

## 2016-09-10 ENCOUNTER — Other Ambulatory Visit: Payer: Self-pay | Admitting: Internal Medicine

## 2016-09-14 ENCOUNTER — Other Ambulatory Visit: Payer: Self-pay | Admitting: Internal Medicine

## 2016-10-13 DIAGNOSIS — Z23 Encounter for immunization: Secondary | ICD-10-CM | POA: Diagnosis not present

## 2016-10-19 DIAGNOSIS — N401 Enlarged prostate with lower urinary tract symptoms: Secondary | ICD-10-CM | POA: Diagnosis not present

## 2016-10-19 DIAGNOSIS — H4052X1 Glaucoma secondary to other eye disorders, left eye, mild stage: Secondary | ICD-10-CM | POA: Diagnosis not present

## 2016-10-23 DIAGNOSIS — N401 Enlarged prostate with lower urinary tract symptoms: Secondary | ICD-10-CM | POA: Diagnosis not present

## 2016-10-23 DIAGNOSIS — R351 Nocturia: Secondary | ICD-10-CM | POA: Diagnosis not present

## 2016-10-25 ENCOUNTER — Ambulatory Visit: Payer: Medicare Other | Admitting: Internal Medicine

## 2016-11-06 ENCOUNTER — Other Ambulatory Visit (INDEPENDENT_AMBULATORY_CARE_PROVIDER_SITE_OTHER): Payer: Medicare Other

## 2016-11-06 DIAGNOSIS — I10 Essential (primary) hypertension: Secondary | ICD-10-CM

## 2016-11-06 DIAGNOSIS — E039 Hypothyroidism, unspecified: Secondary | ICD-10-CM | POA: Diagnosis not present

## 2016-11-06 DIAGNOSIS — E7800 Pure hypercholesterolemia, unspecified: Secondary | ICD-10-CM

## 2016-11-06 DIAGNOSIS — E119 Type 2 diabetes mellitus without complications: Secondary | ICD-10-CM

## 2016-11-06 LAB — LIPID PANEL
Cholesterol: 101 mg/dL (ref 0–200)
HDL: 39.3 mg/dL (ref >39.00)
LDL Cholesterol: 36 mg/dL (ref 0–99)
NonHDL: 61.99
Total CHOL/HDL Ratio: 3
Triglycerides: 128 mg/dL (ref 0.0–149.0)
VLDL: 25.6 mg/dL (ref 0.0–40.0)

## 2016-11-06 LAB — COMPREHENSIVE METABOLIC PANEL
ALT: 24 U/L (ref 0–53)
AST: 20 U/L (ref 0–37)
Albumin: 4.4 g/dL (ref 3.5–5.2)
Alkaline Phosphatase: 40 U/L (ref 39–117)
BUN: 26 mg/dL — ABNORMAL HIGH (ref 6–23)
CO2: 23 mEq/L (ref 19–32)
Calcium: 9.6 mg/dL (ref 8.4–10.5)
Chloride: 103 mEq/L (ref 96–112)
Creatinine, Ser: 1.03 mg/dL (ref 0.40–1.50)
GFR: 75.87 mL/min (ref >60.00)
Glucose, Bld: 212 mg/dL — ABNORMAL HIGH (ref 70–99)
Potassium: 4.9 mEq/L (ref 3.5–5.1)
Sodium: 136 mEq/L (ref 135–145)
Total Bilirubin: 0.6 mg/dL (ref 0.2–1.2)
Total Protein: 7.3 g/dL (ref 6.0–8.3)

## 2016-11-06 LAB — HEMOGLOBIN A1C: Hgb A1c MFr Bld: 8 % — ABNORMAL HIGH (ref 4.6–6.5)

## 2016-11-06 LAB — TSH: TSH: 2.96 u[IU]/mL (ref 0.35–4.50)

## 2016-11-09 ENCOUNTER — Ambulatory Visit (INDEPENDENT_AMBULATORY_CARE_PROVIDER_SITE_OTHER): Payer: Medicare Other | Admitting: Internal Medicine

## 2016-11-09 ENCOUNTER — Encounter: Payer: Self-pay | Admitting: Internal Medicine

## 2016-11-09 VITALS — BP 138/72 | HR 76 | Temp 98.1°F | Resp 16 | Wt 195.0 lb

## 2016-11-09 DIAGNOSIS — E7849 Other hyperlipidemia: Secondary | ICD-10-CM

## 2016-11-09 DIAGNOSIS — E1151 Type 2 diabetes mellitus with diabetic peripheral angiopathy without gangrene: Secondary | ICD-10-CM | POA: Insufficient documentation

## 2016-11-09 DIAGNOSIS — I10 Essential (primary) hypertension: Secondary | ICD-10-CM | POA: Diagnosis not present

## 2016-11-09 DIAGNOSIS — E039 Hypothyroidism, unspecified: Secondary | ICD-10-CM

## 2016-11-09 MED ORDER — GLIMEPIRIDE 4 MG PO TABS: 4.0000 mg | ORAL_TABLET | Freq: Every day | ORAL | 3 refills | Status: DC

## 2016-11-09 MED ORDER — REPAGLINIDE 1 MG PO TABS: 1.0000 mg | ORAL_TABLET | Freq: Three times a day (TID) | ORAL | 1 refills | Status: DC

## 2016-11-09 NOTE — Patient Instructions (Addendum)
  No immunizations administered today.   Medications reviewed and updated.  Changes include moving prandin to prior to your meals.  We will also increase the glimepiride to 4 mg daily.    Please followup in 6 months

## 2016-11-09 NOTE — Assessment & Plan Note (Signed)
A1c 8.0 Sugars are not controlled He has been taking his Prandin, but taking it a couple of hours after each meal-stressed the importance of taking it 15-30 minutes prior to each meal We will increase glimepiride to 4 mg daily Continue metformin at current dose

## 2016-11-09 NOTE — Assessment & Plan Note (Signed)
BP well controlled Current regimen effective and well tolerated Continue current medications at current doses cmp  

## 2016-11-09 NOTE — Assessment & Plan Note (Signed)
Well-controlled Continue current dose of statin

## 2016-11-09 NOTE — Progress Notes (Signed)
Subjective:    Patient ID: [name], male    DOB: 31-Mar-1945, 71 y.o.   MRN: [id number]  HPI The patient is here for follow up.  Hypertension: He is taking his medication daily. He is compliant with a low sodium diet.  He denies chest pain, palpitations, edema, shortness of breath and regular headaches. He is exercising regularly - walking regularly.  He does not monitor his blood pressure at home.    Diabetes: He is taking his medication daily as prescribed. He is compliant with a diabetic diet. He is exercising regularly - walking regularly. He monitors his sugars and they have been running 148, 151, 162, 150, 160, 150 (10 day averages over the past several weeks). He checks his feet daily and denies foot lesions. He is up-to-date with an ophthalmology examination.   Hyperlipidemia: He is taking his medication daily. He is compliant with a low fat/cholesterol diet. He is exercising regularly. He denies myalgias.   Hypothyroidism:  He is taking his medication daily.  He denies any recent changes in energy or weight that are unexplained.    Medications and allergies reviewed with patient and updated if appropriate.  Patient Active Problem List   Diagnosis Date Noted  . Lumbar radiculopathy 05/05/2015  . Atypical atrial flutter ([place]) 09/12/2014  . Obesity 06/25/2013  . Hypothyroidism 12/30/2010  . OSA (obstructive sleep apnea) 09/02/2010  . Atrial fibrillation ([place]) 04/05/2009  . ERECTILE DYSFUNCTION 05/01/2008  . Type II diabetes mellitus ([place]) 09/06/2007  . Hyperlipidemia 09/06/2007  . Hypertension 09/06/2007  . 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 amLODipine (NORVASC) 10 MG tablet Take 1 tablet (10 mg total) by mouth daily. 90 tablet 1  . atorvastatin (LIPITOR) 10 MG tablet Take 1 tablet (10 mg total) by mouth daily. 90 tablet 3  . guaiFENesin  (MUCINEX) 600 MG 12 hr tablet Take 1 tablet (600 mg total) by mouth 2 (two) times daily as needed for cough or to loosen phlegm. 14 tablet 0  . latanoprost (XALATAN) 0.005 % ophthalmic solution Place 1 drop into the left eye at bedtime.    Marland Kitchen levothyroxine (SYNTHROID, LEVOTHROID) 75 MCG tablet Take 1 tablet (75 mcg total) by mouth daily. 90 tablet 1  . Magnesium Oxide 500 MG CAPS Take 1 capsule by mouth 2 (two) times daily.    . metFORMIN (GLUCOPHAGE-XR) 500 MG 24 hr tablet TAKE 2 TABLETS (1,000MG )   TWO TIMES A DAY WITH MEALS 360 tablet 1  . metoprolol succinate (TOPROL-XL) 100 MG 24 hr tablet FOR DIRECTIONS ON HOW TO   TAKE THIS MEDICINE, READ   THE ENCLOSED MEDICATION    INFORMATION FORM 225 tablet 1  . metoprolol tartrate (LOPRESSOR) 50 MG tablet TAKE 1 TABLET EVERY 4 HOURSAS NEEDED (FAST HEART RATE) 30 tablet 3  . quinapril (ACCUPRIL) 40 MG tablet TAKE 1 TABLET DAILY 90 tablet 1  . XARELTO 20 MG TABS tablet TAKE 1 TABLET DAILY WITH   SUPPER 180 tablet 0   No current facility-administered medications on file prior to visit.     Past Medical History:  Diagnosis Date  . AF (atrial fibrillation) (HCC)    persistent afib, s/p afib ablation x 2 with subsequent convergent procedure at [facility]  . Allergic rhinitis    seasonal  . Atrial flutter ([place])   . CTS (carpal tunnel syndrome)    PMH of  .  Diabetes mellitus    type 2  . Hyperlipidemia   . Hypertension   . Hypothyroidism   . Renal calculus 2005   [facility]    Past Surgical History:  Procedure Laterality Date  . ABLATION  06/2013   atypical atrial flutter ablation by Dr Lehman Prom at [facility] (mitral annular flutter ablated)  . afib 09/22/2008--11/03/2009     afib ablation x 2 by Dr Rayann Heman with subsequent convergent procedure at [facility]  . CARDIAC CATHETERIZATION  2005   non obstructive CAD  . CARDIOVERSION    . CARDIOVERSION  12/09/2010   Procedure: CARDIOVERSION;  Surgeon: Loralie Champagne, MD;  Location: [place];  Service: Cardiovascular;   Laterality: N/A;  . CARDIOVERSION  09/15/2011   Procedure: CARDIOVERSION;  Surgeon: Josue Hector, MD;  Location: [place];  Service: Cardiovascular;  Laterality: N/A;  office drawing labs wed 8/28  . CARDIOVERSION N/A 07/18/2012   Procedure: CARDIOVERSION;  Surgeon: Thayer Headings, MD;  Location: [place];  Service: Cardiovascular;  Laterality: N/A;  . CARDIOVERSION  08/2014   [place]  . COLONOSCOPY  2009   Dr Sharlett Iles  . convergent procedure  2013   at [facility] for a fib  . JOINT REPLACEMENT Right 2008, 2012  . LOOP RECORDER IMPLANT Left 12-2010  . TONSILLECTOMY    . TOTAL HIP ARTHROPLASTY  06/2010   Depuy prosthesis replacement, Dr Alvan Dame  . TOTAL HIP ARTHROPLASTY Right 2008    Depuy;Dr Alvan Dame    Social History   Social History  . Marital status: Married    Spouse name: Izora Gala  . Number of children: Y  . Years of education: N/A   Occupational History  . BUYER [employer]   Social History Main Topics  . Smoking status: Former Smoker    Packs/day: 0.90    Years: 8.00    Types: Cigarettes    Quit date: 01/16/1974  . Smokeless tobacco: Never Used     Comment: smoked  1966- 1976,up to 1 ppd  . Alcohol use 0.0 oz/week     Comment: Socially; "sips socially"( essentially none)  . Drug use: No  . Sexual activity: Not Asked   Other Topics Concern  . None   Social History Narrative  . None    Family History  Problem Relation Age of Onset  . Lung cancer Mother        non smoker  . Stroke Mother 75  . Diabetes Father   . Stroke Father        mid 75s  . Testicular cancer Brother   . Allergies Maternal Grandmother   . Cancer Maternal Grandmother        unsure  . Allergies Brother   . Heart attack Neg Hx     Review of Systems  Constitutional: Negative for chills and fever.  Respiratory: Negative for cough, shortness of breath and wheezing.   Cardiovascular: Negative for chest pain, palpitations and leg swelling.  Neurological: Negative for  light-headedness and headaches.       Objective:   Vitals:   11/09/16 0852  BP: 138/72  Pulse: 76  Resp: 16  Temp: 98.1 F (36.7 C)  SpO2: 98%   Wt Readings from Last 3 Encounters:  11/09/16 195 lb (88.5 kg)  08/29/16 195 lb (88.5 kg)  04/25/16 201 lb (91.2 kg)   Body mass index is 29.65 kg/m.   Physical Exam    Constitutional: Appears well-developed and well-nourished. No distress.  HENT:  Head: Normocephalic and atraumatic.  Neck: Neck supple. No tracheal deviation present. No thyromegaly present.  No cervical lymphadenopathy Cardiovascular: Normal rate, regular rhythm and normal heart sounds.   No murmur heard. No carotid bruit .  No edema Pulmonary/Chest: Effort normal and breath sounds normal. No respiratory distress. No has no wheezes. No rales.  Skin: Skin is warm and dry. Not diaphoretic.  Psychiatric: Normal mood and affect. Behavior is normal.      Assessment & Plan:    See Problem List for Assessment and Plan of chronic medical problems.

## 2016-11-09 NOTE — Assessment & Plan Note (Signed)
TSH in normal range Continue current dose of medication

## 2016-11-15 ENCOUNTER — Ambulatory Visit (INDEPENDENT_AMBULATORY_CARE_PROVIDER_SITE_OTHER): Payer: Medicare Other | Admitting: Ophthalmology

## 2016-11-15 ENCOUNTER — Encounter: Payer: Self-pay | Admitting: Internal Medicine

## 2016-11-16 NOTE — Telephone Encounter (Signed)
If you're okay with this, I will send a refill in.

## 2016-11-17 ENCOUNTER — Other Ambulatory Visit: Payer: Self-pay | Admitting: Emergency Medicine

## 2016-11-17 NOTE — Telephone Encounter (Signed)
Yes, ok to send in

## 2016-12-11 ENCOUNTER — Encounter (INDEPENDENT_AMBULATORY_CARE_PROVIDER_SITE_OTHER): Payer: Medicare Other | Admitting: Ophthalmology

## 2016-12-17 ENCOUNTER — Other Ambulatory Visit: Payer: Self-pay | Admitting: Internal Medicine

## 2016-12-17 DIAGNOSIS — E1151 Type 2 diabetes mellitus with diabetic peripheral angiopathy without gangrene: Secondary | ICD-10-CM

## 2016-12-22 ENCOUNTER — Ambulatory Visit (INDEPENDENT_AMBULATORY_CARE_PROVIDER_SITE_OTHER): Payer: Medicare Other | Admitting: Family

## 2016-12-22 ENCOUNTER — Encounter: Payer: Self-pay | Admitting: Podiatry

## 2016-12-22 ENCOUNTER — Encounter: Payer: Self-pay | Admitting: Family

## 2016-12-22 ENCOUNTER — Ambulatory Visit (INDEPENDENT_AMBULATORY_CARE_PROVIDER_SITE_OTHER): Payer: Medicare Other | Admitting: Podiatry

## 2016-12-22 VITALS — BP 112/76 | HR 81 | Temp 98.0°F | Ht 68.0 in | Wt 196.0 lb

## 2016-12-22 DIAGNOSIS — L600 Ingrowing nail: Secondary | ICD-10-CM | POA: Diagnosis not present

## 2016-12-22 DIAGNOSIS — M79675 Pain in left toe(s): Secondary | ICD-10-CM | POA: Diagnosis not present

## 2016-12-22 DIAGNOSIS — H6982 Other specified disorders of Eustachian tube, left ear: Secondary | ICD-10-CM | POA: Diagnosis not present

## 2016-12-22 DIAGNOSIS — H6501 Acute serous otitis media, right ear: Secondary | ICD-10-CM

## 2016-12-22 MED ORDER — CIPROFLOXACIN-DEXAMETHASONE 0.3-0.1 % OT SUSP: 4.0000 [drp] | Freq: Two times a day (BID) | OTIC | 0 refills | Status: DC

## 2016-12-22 MED ORDER — FLUTICASONE PROPIONATE 50 MCG/ACT NA SUSP: 2.0000 | Freq: Every day | NASAL | 6 refills | Status: DC

## 2016-12-22 MED ORDER — AMOXICILLIN-POT CLAVULANATE 875-125 MG PO TABS: 1.0000 | ORAL_TABLET | Freq: Two times a day (BID) | ORAL | 0 refills | Status: DC

## 2016-12-22 NOTE — Progress Notes (Signed)
[name] is a 71 y.o. male with the following history as recorded in Epic[employer]:  Patient Active Problem List   Diagnosis Date Noted  . Diabetes mellitus with peripheral circulatory disorder, controlled ([place]) 11/09/2016  . Lumbar radiculopathy 05/05/2015  . Atypical atrial flutter ([place]) 09/12/2014  . Obesity 06/25/2013  . Hypothyroidism 12/30/2010  . OSA (obstructive sleep apnea) 09/02/2010  . Atrial fibrillation ([place]) 04/05/2009  . ERECTILE DYSFUNCTION 05/01/2008  . Hyperlipidemia 09/06/2007  . Hypertension 09/06/2007  . BPH with obstruction/lower urinary tract symptoms 04/25/2007    Current Outpatient Medications  Medication Sig Dispense Refill  . amLODipine (NORVASC) 10 MG tablet Take 1 tablet (10 mg total) by mouth daily. 90 tablet 1  . amLODipine (NORVASC) 10 MG tablet TAKE 1 TABLET DAILY. 90 tablet 1  . atorvastatin (LIPITOR) 10 MG tablet Take 1 tablet (10 mg total) by mouth daily. 90 tablet 3  . glimepiride (AMARYL) 4 MG tablet Take 1 tablet (4 mg total) by mouth daily before breakfast. 90 tablet 3  . guaiFENesin (MUCINEX) 600 MG 12 hr tablet Take 1 tablet (600 mg total) by mouth 2 (two) times daily as needed for cough or to loosen phlegm. 14 tablet 0  . latanoprost (XALATAN) 0.005 % ophthalmic solution Place 1 drop into the left eye at bedtime.    Marland Kitchen levothyroxine (SYNTHROID, LEVOTHROID) 75 MCG tablet TAKE 1 TABLET DAILY 90 tablet 1  . Magnesium Oxide 500 MG CAPS Take 1 capsule by mouth 2 (two) times daily.    . metFORMIN (GLUCOPHAGE-XR) 500 MG 24 hr tablet TAKE 2 TABLETS (1,000MG )   TWO TIMES A DAY WITH MEALS 360 tablet 1  . metoprolol succinate (TOPROL-XL) 100 MG 24 hr tablet FOR DIRECTIONS ON HOW TO   TAKE THIS MEDICINE, READ   THE ENCLOSED MEDICATION    INFORMATION FORM 225 tablet 1  . quinapril (ACCUPRIL) 40 MG tablet TAKE 1 TABLET DAILY 90 tablet 1  . repaglinide (PRANDIN) 1 MG tablet Take 1 tablet (1 mg total) by mouth 3 (three) times daily before meals. Take 15-30  min prior to meals 270 tablet 1  . repaglinide (PRANDIN) 1 MG tablet TAKE 1 TABLET 3 TIMES A DAYBEFORE MEALS. 270 tablet 1  . XARELTO 20 MG TABS tablet TAKE 1 TABLET DAILY WITH   SUPPER 180 tablet 0  . amoxicillin-clavulanate (AUGMENTIN) 875-125 MG tablet Take 1 tablet by mouth 2 (two) times daily. 20 tablet 0  . ciprofloxacin-dexamethasone (CIPRODEX) OTIC suspension Place 4 drops into both ears 2 (two) times daily. 7.5 mL 0  . fluticasone (FLONASE) 50 MCG/ACT nasal spray Place 2 sprays into both nostrils daily. 16 g 6   No current facility-administered medications for this visit.     Allergies: Patient has no known allergies.  Past Medical History:  Diagnosis Date  . AF (atrial fibrillation) (HCC)    persistent afib, s/p afib ablation x 2 with subsequent convergent procedure at [facility]  . Allergic rhinitis    seasonal  . Atrial flutter ([place])   . CTS (carpal tunnel syndrome)    PMH of  . Diabetes mellitus    type 2  . Hyperlipidemia   . Hypertension   . Hypothyroidism   . Renal calculus 2005   [facility]    Past Surgical History:  Procedure Laterality Date  . ABLATION  06/2013   atypical atrial flutter ablation by Dr Lehman Prom at [facility] (mitral annular flutter ablated)  . afib 09/22/2008--11/03/2009     afib ablation x 2  by Dr Rayann Heman with subsequent convergent procedure at [facility]  . CARDIAC CATHETERIZATION  2005   non obstructive CAD  . CARDIOVERSION    . CARDIOVERSION  12/09/2010   Procedure: CARDIOVERSION;  Surgeon: Loralie Champagne, MD;  Location: [place];  Service: Cardiovascular;  Laterality: N/A;  . CARDIOVERSION  09/15/2011   Procedure: CARDIOVERSION;  Surgeon: Josue Hector, MD;  Location: [place];  Service: Cardiovascular;  Laterality: N/A;  office drawing labs wed 8/28  . CARDIOVERSION N/A 07/18/2012   Procedure: CARDIOVERSION;  Surgeon: Thayer Headings, MD;  Location: [place];  Service: Cardiovascular;  Laterality: N/A;  . CARDIOVERSION  08/2014   [place]  .  COLONOSCOPY  2009   Dr Sharlett Iles  . convergent procedure  2013   at [facility] for a fib  . JOINT REPLACEMENT Right 2008, 2012  . LOOP RECORDER IMPLANT Left 12-2010  . TONSILLECTOMY    . TOTAL HIP ARTHROPLASTY  06/2010   Depuy prosthesis replacement, Dr Alvan Dame  . TOTAL HIP ARTHROPLASTY Right 2008    Depuy;Dr Alvan Dame    Family History  Problem Relation Age of Onset  . Diabetes Father   . Stroke Father        mid 65s  . Lung cancer Mother        non smoker  . Stroke Mother 54  . Testicular cancer Brother   . Allergies Maternal Grandmother   . Cancer Maternal Grandmother        unsure  . Allergies Brother   . Heart attack Neg Hx     Social History   Tobacco Use  . Smoking status: Former Smoker    Packs/day: 0.90    Years: 8.00    Pack years: 7.20    Types: Cigarettes    Last attempt to quit: 01/16/1974    Years since quitting: 42.9  . Smokeless tobacco: Never Used  . Tobacco comment: smoked  1966- 1976,up to 1 ppd  Substance Use Topics  . Alcohol use: Yes    Alcohol/week: 0.0 oz    Comment: Socially; "sips socially"( essentially none)    Subjective:  R ear pain x 3-4 days; seems to be worsening in the past 24 hours; feels "stopped up" on the right side; + sinus pressure/ teeth pressure on the right side; no fever; mild cough; no shortness of breath or difficulty breathing.  Objective:  Vitals:   12/22/16 1619  BP: 112/76  Pulse: 81  Temp: 98 F (36.7 C)  TempSrc: Oral  SpO2: 98%  Weight: 196 lb (88.9 kg)  Height: 5\' 8"  (1.727 m)    General: Well developed, well nourished, in no acute distress  Skin : Warm and dry.  Head: Normocephalic and atraumatic  Eyes: Sclera and conjunctiva clear; pupils round and reactive to light; extraocular movements intact  Ears: External normal; canals clear; tympanic membranes full/ congested Oropharynx: Pink, supple. No suspicious lesions  Neck: Supple without thyromegaly, adenopathy  Lungs: Respirations unlabored; clear to auscultation  bilaterally without wheeze, rales, rhonchi  Neurologic: Alert and oriented; speech intact; face symmetrical; moves all extremities well; CNII-XII intact without focal deficit  Assessment:  1. Eustachian tube dysfunction, left   2. Right acute serous otitis media, recurrence not specified     Plan:  Rx for Flonase- use as directed; Rx for Augmentin 875 mg bid x 10 days- hold and fill if no improvement in symptoms within 48-72 hours; He is given Rx for Ciprodex as an alternative to an otic ear drop  called [employer] that he got on a cruise ship; he will talk to his pharmacist to see if this is something he wants to fill.  No Follow-up on file.  No orders of the defined types were placed in this encounter.   Requested Prescriptions   Signed Prescriptions Disp Refills  . fluticasone (FLONASE) 50 MCG/ACT nasal spray 16 g 6    Sig: Place 2 sprays into both nostrils daily.  . ciprofloxacin-dexamethasone (CIPRODEX) OTIC suspension 7.5 mL 0    Sig: Place 4 drops into both ears 2 (two) times daily.  Marland Kitchen amoxicillin-clavulanate (AUGMENTIN) 875-125 MG tablet 20 tablet 0    Sig: Take 1 tablet by mouth 2 (two) times daily.

## 2016-12-23 ENCOUNTER — Encounter: Payer: Self-pay | Admitting: Family

## 2016-12-26 NOTE — Progress Notes (Signed)
Subjective:   Patient ID: [name], male   DOB: 71 y.o.   MRN: [id number]   HPI Patient presents concerned with diabetes and numbness of his left big toe and not sure if he has an ingrown toenail.   ROS      Objective:  Physical Exam  Neurovascular status checked and found to be unchanged from previous visit with patient noted to have incurvation of the medial border of the left hallux localized in nature with no drainage noted or other pathology.     Assessment:  Possibility for neuropathic changes versus possibility for arthritis of the interphalangeal joint versus ingrown toenail.     Plan:  H&P and condition reviewed with patient.  At this point we are going to try cushioning with soaks and patient will give me a better idea of where it is coming from and I discussed all 3 problems that it could be.  Reappoint as needed

## 2016-12-27 ENCOUNTER — Encounter (INDEPENDENT_AMBULATORY_CARE_PROVIDER_SITE_OTHER): Payer: Medicare Other | Admitting: Ophthalmology

## 2016-12-27 DIAGNOSIS — E11319 Type 2 diabetes mellitus with unspecified diabetic retinopathy without macular edema: Secondary | ICD-10-CM

## 2016-12-27 DIAGNOSIS — H2513 Age-related nuclear cataract, bilateral: Secondary | ICD-10-CM

## 2016-12-27 DIAGNOSIS — E113293 Type 2 diabetes mellitus with mild nonproliferative diabetic retinopathy without macular edema, bilateral: Secondary | ICD-10-CM

## 2016-12-27 DIAGNOSIS — H43813 Vitreous degeneration, bilateral: Secondary | ICD-10-CM | POA: Diagnosis not present

## 2016-12-27 DIAGNOSIS — I10 Essential (primary) hypertension: Secondary | ICD-10-CM | POA: Diagnosis not present

## 2016-12-27 DIAGNOSIS — H35033 Hypertensive retinopathy, bilateral: Secondary | ICD-10-CM | POA: Diagnosis not present

## 2016-12-27 LAB — HM DIABETES EYE EXAM

## 2016-12-28 ENCOUNTER — Encounter: Payer: Self-pay | Admitting: Internal Medicine

## 2017-01-02 ENCOUNTER — Other Ambulatory Visit: Payer: Self-pay | Admitting: Internal Medicine

## 2017-01-02 DIAGNOSIS — D225 Melanocytic nevi of trunk: Secondary | ICD-10-CM | POA: Diagnosis not present

## 2017-01-02 DIAGNOSIS — Z1283 Encounter for screening for malignant neoplasm of skin: Secondary | ICD-10-CM | POA: Diagnosis not present

## 2017-01-02 DIAGNOSIS — L648 Other androgenic alopecia: Secondary | ICD-10-CM | POA: Diagnosis not present

## 2017-01-02 MED ORDER — METOPROLOL SUCCINATE ER 100 MG PO TB24: ORAL_TABLET | ORAL | 0 refills | Status: DC

## 2017-01-02 MED ORDER — METOPROLOL TARTRATE 50 MG PO TABS: ORAL_TABLET | ORAL | 0 refills | Status: DC

## 2017-01-02 NOTE — Addendum Note (Signed)
Addended by: Derl Barrow on: 01/02/2017 11:10 AM   Modules accepted: Orders

## 2017-01-02 NOTE — Telephone Encounter (Signed)
Pt's medications were resent to pt's pharmacy as requested by pt's wife Izora Gala. Confirmation received.

## 2017-01-16 ENCOUNTER — Other Ambulatory Visit: Payer: Self-pay | Admitting: Internal Medicine

## 2017-01-17 ENCOUNTER — Other Ambulatory Visit: Payer: Self-pay | Admitting: Internal Medicine

## 2017-01-17 ENCOUNTER — Encounter: Payer: Self-pay | Admitting: Internal Medicine

## 2017-01-17 MED ORDER — RIVAROXABAN 20 MG PO TABS: 20.0000 mg | ORAL_TABLET | Freq: Every day | ORAL | 0 refills | Status: DC

## 2017-01-18 ENCOUNTER — Other Ambulatory Visit: Payer: Self-pay | Admitting: Emergency Medicine

## 2017-01-18 DIAGNOSIS — E1151 Type 2 diabetes mellitus with diabetic peripheral angiopathy without gangrene: Secondary | ICD-10-CM

## 2017-01-18 MED ORDER — ATORVASTATIN CALCIUM 10 MG PO TABS: 10.0000 mg | ORAL_TABLET | Freq: Every day | ORAL | 1 refills | Status: DC

## 2017-01-18 MED ORDER — METOPROLOL SUCCINATE ER 100 MG PO TB24: ORAL_TABLET | ORAL | 0 refills | Status: DC

## 2017-01-18 MED ORDER — QUINAPRIL HCL 40 MG PO TABS: 40.0000 mg | ORAL_TABLET | Freq: Every day | ORAL | 1 refills | Status: DC

## 2017-01-18 MED ORDER — METFORMIN HCL ER 500 MG PO TB24: ORAL_TABLET | ORAL | 1 refills | Status: DC

## 2017-01-18 MED ORDER — GLIMEPIRIDE 4 MG PO TABS: 4.0000 mg | ORAL_TABLET | Freq: Every day | ORAL | 1 refills | Status: DC

## 2017-01-18 MED ORDER — REPAGLINIDE 1 MG PO TABS: 1.0000 mg | ORAL_TABLET | Freq: Three times a day (TID) | ORAL | 1 refills | Status: DC

## 2017-01-18 MED ORDER — AMLODIPINE BESYLATE 10 MG PO TABS: 10.0000 mg | ORAL_TABLET | Freq: Every day | ORAL | 1 refills | Status: DC

## 2017-01-18 MED ORDER — LEVOTHYROXINE SODIUM 75 MCG PO TABS: 75.0000 ug | ORAL_TABLET | Freq: Every day | ORAL | 1 refills | Status: DC

## 2017-02-02 ENCOUNTER — Encounter: Payer: Self-pay | Admitting: Internal Medicine

## 2017-03-26 ENCOUNTER — Other Ambulatory Visit (INDEPENDENT_AMBULATORY_CARE_PROVIDER_SITE_OTHER): Payer: Medicare Other

## 2017-03-26 DIAGNOSIS — I10 Essential (primary) hypertension: Secondary | ICD-10-CM

## 2017-03-26 DIAGNOSIS — E039 Hypothyroidism, unspecified: Secondary | ICD-10-CM

## 2017-03-26 DIAGNOSIS — E1151 Type 2 diabetes mellitus with diabetic peripheral angiopathy without gangrene: Secondary | ICD-10-CM

## 2017-03-26 DIAGNOSIS — E7849 Other hyperlipidemia: Secondary | ICD-10-CM | POA: Diagnosis not present

## 2017-03-26 LAB — COMPREHENSIVE METABOLIC PANEL
ALT: 29 U/L (ref 0–53)
AST: 19 U/L (ref 0–37)
Albumin: 4.2 g/dL (ref 3.5–5.2)
Alkaline Phosphatase: 41 U/L (ref 39–117)
BUN: 19 mg/dL (ref 6–23)
CO2: 25 mEq/L (ref 19–32)
Calcium: 9.9 mg/dL (ref 8.4–10.5)
Chloride: 105 mEq/L (ref 96–112)
Creatinine, Ser: 0.99 mg/dL (ref 0.40–1.50)
GFR: 79.33 mL/min (ref >60.00)
Glucose, Bld: 172 mg/dL — ABNORMAL HIGH (ref 70–99)
Potassium: 4.9 mEq/L (ref 3.5–5.1)
Sodium: 141 mEq/L (ref 135–145)
Total Bilirubin: 0.5 mg/dL (ref 0.2–1.2)
Total Protein: 7.1 g/dL (ref 6.0–8.3)

## 2017-03-26 LAB — CBC WITH DIFFERENTIAL/PLATELET
Basophils Absolute: 0.1 10*3/uL (ref 0.0–0.1)
Basophils Relative: 0.9 % (ref 0.0–3.0)
Eosinophils Absolute: 0.5 10*3/uL (ref 0.0–0.7)
Eosinophils Relative: 8.1 % — ABNORMAL HIGH (ref 0.0–5.0)
HCT: 43.4 % (ref 39.0–52.0)
Hemoglobin: 14.7 g/dL (ref 13.0–17.0)
Lymphocytes Relative: 26.9 % (ref 12.0–46.0)
Lymphs Abs: 1.8 10*3/uL (ref 0.7–4.0)
MCHC: 33.9 g/dL (ref 30.0–36.0)
MCV: 87.1 fl (ref 78.0–100.0)
Monocytes Absolute: 0.8 10*3/uL (ref 0.1–1.0)
Monocytes Relative: 11.4 % (ref 3.0–12.0)
Neutro Abs: 3.5 10*3/uL (ref 1.4–7.7)
Neutrophils Relative %: 52.7 % (ref 43.0–77.0)
Platelets: 218 10*3/uL (ref 150.0–400.0)
RBC: 4.98 Mil/uL (ref 4.22–5.81)
RDW: 14.2 % (ref 11.5–15.5)
WBC: 6.7 10*3/uL (ref 4.0–10.5)

## 2017-03-26 LAB — HEMOGLOBIN A1C: Hgb A1c MFr Bld: 8.7 % — ABNORMAL HIGH (ref 4.6–6.5)

## 2017-03-26 LAB — LIPID PANEL
Cholesterol: 90 mg/dL (ref 0–200)
HDL: 44.1 mg/dL (ref >39.00)
LDL Cholesterol: 31 mg/dL (ref 0–99)
NonHDL: 45.57
Total CHOL/HDL Ratio: 2
Triglycerides: 74 mg/dL (ref 0.0–149.0)
VLDL: 14.8 mg/dL (ref 0.0–40.0)

## 2017-03-26 LAB — TSH: TSH: 2.29 u[IU]/mL (ref 0.35–4.50)

## 2017-03-27 NOTE — Progress Notes (Signed)
Subjective:    Patient ID: [name], male    DOB: 1945/03/04, 72 y.o.   MRN: [id number]  HPI The patient is here for follow up.  Cold symptoms;  He is taking mucinex, flonase.  He has nasal congestion with discolored mucus, mild sinus pressure, cough, wheeze and some lightheadedness.  He denies any fevers, chills and shortness of breath.  Diabetes: He is taking his medication daily as prescribed. He is compliant with a diabetic diet. He is exercising regularly - walking. He monitors his sugars and they have been running 140-180's. He checks his feet daily and denies foot lesions. He is up-to-date with an ophthalmology examination.   Hypertension: He is taking his medication daily. He is compliant with a low sodium diet.  He denies chest pain, palpitations, shortness of breath and regular headaches. He is exercising somewhat regularly.    Hypothyroidism:  He is taking his medication daily.  He denies any recent changes in energy or weight that are unexplained.   Hyperlipidemia: He is taking his medication daily. He is compliant with a low fat/cholesterol diet. He is exercising regularly. He denies myalgias.    Medications and allergies reviewed with patient and updated if appropriate.  Patient Active Problem List   Diagnosis Date Noted  . Diabetes mellitus with peripheral circulatory disorder, controlled ([place]) 11/09/2016  . Lumbar radiculopathy 05/05/2015  . Atypical atrial flutter ([place]) 09/12/2014  . Obesity 06/25/2013  . Hypothyroidism 12/30/2010  . OSA (obstructive sleep apnea) 09/02/2010  . Atrial fibrillation ([place]) 04/05/2009  . ERECTILE DYSFUNCTION 05/01/2008  . Hyperlipidemia 09/06/2007  . Hypertension 09/06/2007  . BPH with obstruction/lower urinary tract symptoms 04/25/2007    Current Outpatient Medications on File Prior to Visit  Medication Sig Dispense Refill  . amLODipine (NORVASC) 10 MG tablet Take 1 tablet (10 mg total) by mouth daily. 90 tablet 1  .  atorvastatin (LIPITOR) 10 MG tablet Take 1 tablet (10 mg total) by mouth daily. 90 tablet 1  . fluticasone (FLONASE) 50 MCG/ACT nasal spray Place 2 sprays into both nostrils daily. 16 g 6  . glimepiride (AMARYL) 4 MG tablet Take 1 tablet (4 mg total) by mouth daily before breakfast. 90 tablet 1  . guaiFENesin (MUCINEX) 600 MG 12 hr tablet Take 1 tablet (600 mg total) by mouth 2 (two) times daily as needed for cough or to loosen phlegm. 14 tablet 0  . latanoprost (XALATAN) 0.005 % ophthalmic solution Place 1 drop into the left eye at bedtime.    Marland Kitchen levothyroxine (SYNTHROID, LEVOTHROID) 75 MCG tablet Take 1 tablet (75 mcg total) by mouth daily. 90 tablet 1  . Magnesium Oxide 500 MG CAPS Take 1 capsule by mouth 2 (two) times daily.    . metFORMIN (GLUCOPHAGE-XR) 500 MG 24 hr tablet TAKE 2 TABLETS (1,000MG )   TWO TIMES A DAY WITH MEALS 360 tablet 1  . metoprolol succinate (TOPROL-XL) 100 MG 24 hr tablet Take 1 tablet twice daily and an extra 1/2 tablet daily as needed for atrial fibrillation. Please keep upcoming appt. Thank you 225 tablet 0  . metoprolol tartrate (LOPRESSOR) 50 MG tablet Take 1 tablet every 4 hours as needed (Fast Heart Rate). Please keep upcoming appt for future refills. Thank you 540 tablet 0  . quinapril (ACCUPRIL) 40 MG tablet Take 1 tablet (40 mg total) by mouth daily. 90 tablet 1  . repaglinide (PRANDIN) 1 MG tablet Take 1 tablet (1 mg total) by mouth 3 (three) times daily before meals.  Take 15-30 min prior to meals 270 tablet 1  . rivaroxaban (XARELTO) 20 MG TABS tablet Take 1 tablet (20 mg total) by mouth daily with supper. 90 tablet 0   No current facility-administered medications on file prior to visit.     Past Medical History:  Diagnosis Date  . AF (atrial fibrillation) (HCC)    persistent afib, s/p afib ablation x 2 with subsequent convergent procedure at [facility]  . Allergic rhinitis    seasonal  . Atrial flutter ([place])   . CTS (carpal tunnel syndrome)    PMH of  .  Diabetes mellitus    type 2  . Hyperlipidemia   . Hypertension   . Hypothyroidism   . Renal calculus 2005   [facility]    Past Surgical History:  Procedure Laterality Date  . ABLATION  06/2013   atypical atrial flutter ablation by Dr Lehman Prom at [facility] (mitral annular flutter ablated)  . afib 09/22/2008--11/03/2009     afib ablation x 2 by Dr Rayann Heman with subsequent convergent procedure at [facility]  . CARDIAC CATHETERIZATION  2005   non obstructive CAD  . CARDIOVERSION    . CARDIOVERSION  12/09/2010   Procedure: CARDIOVERSION;  Surgeon: Loralie Champagne, MD;  Location: [place];  Service: Cardiovascular;  Laterality: N/A;  . CARDIOVERSION  09/15/2011   Procedure: CARDIOVERSION;  Surgeon: Josue Hector, MD;  Location: [place];  Service: Cardiovascular;  Laterality: N/A;  office drawing labs wed 8/28  . CARDIOVERSION N/A 07/18/2012   Procedure: CARDIOVERSION;  Surgeon: Thayer Headings, MD;  Location: [place];  Service: Cardiovascular;  Laterality: N/A;  . CARDIOVERSION  08/2014   [place]  . COLONOSCOPY  2009   Dr Sharlett Iles  . convergent procedure  2013   at [facility] for a fib  . JOINT REPLACEMENT Right 2008, 2012  . LOOP RECORDER IMPLANT Left 12-2010  . TONSILLECTOMY    . TOTAL HIP ARTHROPLASTY  06/2010   Depuy prosthesis replacement, Dr Alvan Dame  . TOTAL HIP ARTHROPLASTY Right 2008    Depuy;Dr Alvan Dame    Social History   Socioeconomic History  . Marital status: Married    Spouse name: Izora Gala  . Number of children: Y  . Years of education: None  . Highest education level: None  Social Needs  . Financial resource strain: None  . Food insecurity - worry: None  . Food insecurity - inability: None  . Transportation needs - medical: None  . Transportation needs - non-medical: None  Occupational History  . 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  Tobacco Use  . Smoking status: Former Smoker    Packs/day: 0.90    Years: 8.00    Pack years: 7.20    Types:  Cigarettes    Last attempt to quit: 01/16/1974    Years since quitting: 43.2  . Smokeless tobacco: Never Used  . Tobacco comment: smoked  1966- 1976,up to 1 ppd  Substance and Sexual Activity  . Alcohol use: Yes    Alcohol/week: 0.0 oz    Comment: Socially; "sips socially"( essentially none)  . Drug use: No  . Sexual activity: None  Other Topics Concern  . None  Social History Narrative  . None    Family History  Problem Relation Age of Onset  . Diabetes Father   . Stroke Father        mid 21s  . Lung cancer Mother        non smoker  .  Stroke Mother 16  . Testicular cancer Brother   . Allergies Maternal Grandmother   . Cancer Maternal Grandmother        unsure  . Allergies Brother   . Heart attack Neg Hx     Review of Systems  Constitutional: Negative for chills and fever.  HENT: Positive for congestion (discolored mucus) and sinus pressure. Negative for ear pain and sore throat.   Respiratory: Positive for cough and wheezing. Negative for shortness of breath.   Cardiovascular: Positive for leg swelling (on vacation). Negative for chest pain and palpitations.  Neurological: Positive for light-headedness. Negative for headaches.       Objective:   Vitals:   03/28/17 0949  BP: (!) 150/62  Pulse: 71  Resp: 16  Temp: 98.1 F (36.7 C)  SpO2: 98%   BP Readings from Last 3 Encounters:  03/28/17 (!) 150/62  12/22/16 112/76  11/09/16 138/72   Wt Readings from Last 3 Encounters:  03/28/17 191 lb (86.6 kg)  12/22/16 196 lb (88.9 kg)  11/09/16 195 lb (88.5 kg)   Body mass index is 29.04 kg/m.   Physical Exam    Constitutional: Appears well-developed and well-nourished. No distress.  HENT:  Head: Normocephalic and atraumatic.  Neck: Bilateral ear canals and tympanic membranes normal.  No oropharynx erythema.  Neck supple. No tracheal deviation present. No thyromegaly present.  No cervical lymphadenopathy Cardiovascular: Normal rate, regular rhythm and normal  heart sounds.   No murmur heard. No carotid bruit .  No edema Pulmonary/Chest:  No respiratory distress.  Good air entry bilaterally, diffuse expiratory wheeze. No rales.  Skin: Skin is warm and dry. Not diaphoretic.  Psychiatric: Normal mood and affect. Behavior is normal.      Assessment & Plan:    See Problem List for Assessment and Plan of chronic medical problems.

## 2017-03-28 ENCOUNTER — Encounter: Payer: Self-pay | Admitting: Internal Medicine

## 2017-03-28 ENCOUNTER — Ambulatory Visit (INDEPENDENT_AMBULATORY_CARE_PROVIDER_SITE_OTHER): Payer: Medicare Other | Admitting: Internal Medicine

## 2017-03-28 VITALS — BP 132/70 | HR 58 | Ht 68.0 in | Wt 190.0 lb

## 2017-03-28 VITALS — BP 150/62 | HR 71 | Temp 98.1°F | Resp 16 | Wt 191.0 lb

## 2017-03-28 DIAGNOSIS — E039 Hypothyroidism, unspecified: Secondary | ICD-10-CM

## 2017-03-28 DIAGNOSIS — E7849 Other hyperlipidemia: Secondary | ICD-10-CM

## 2017-03-28 DIAGNOSIS — I481 Persistent atrial fibrillation: Secondary | ICD-10-CM

## 2017-03-28 DIAGNOSIS — R062 Wheezing: Secondary | ICD-10-CM | POA: Diagnosis not present

## 2017-03-28 DIAGNOSIS — E1151 Type 2 diabetes mellitus with diabetic peripheral angiopathy without gangrene: Secondary | ICD-10-CM | POA: Diagnosis not present

## 2017-03-28 DIAGNOSIS — I4819 Other persistent atrial fibrillation: Secondary | ICD-10-CM

## 2017-03-28 DIAGNOSIS — J069 Acute upper respiratory infection, unspecified: Secondary | ICD-10-CM | POA: Insufficient documentation

## 2017-03-28 DIAGNOSIS — I10 Essential (primary) hypertension: Secondary | ICD-10-CM | POA: Diagnosis not present

## 2017-03-28 MED ORDER — METHYLPREDNISOLONE ACETATE 80 MG/ML IJ SUSP
80.0000 mg | Freq: Once | INTRAMUSCULAR | Status: AC
  Administered 2017-03-28: 80 mg via INTRAMUSCULAR

## 2017-03-28 MED ORDER — CEFDINIR 300 MG PO CAPS: 300.0000 mg | ORAL_CAPSULE | Freq: Two times a day (BID) | ORAL | 0 refills | Status: DC

## 2017-03-28 MED ORDER — RIVAROXABAN 20 MG PO TABS: 20.0000 mg | ORAL_TABLET | Freq: Every day | ORAL | 3 refills | Status: DC

## 2017-03-28 MED ORDER — QUINAPRIL HCL 40 MG PO TABS: 40.0000 mg | ORAL_TABLET | Freq: Every day | ORAL | 1 refills | Status: DC

## 2017-03-28 MED ORDER — REPAGLINIDE 2 MG PO TABS: 2.0000 mg | ORAL_TABLET | Freq: Three times a day (TID) | ORAL | 3 refills | Status: DC

## 2017-03-28 NOTE — Assessment & Plan Note (Signed)
Lipids well controlled  Continue statin 

## 2017-03-28 NOTE — Assessment & Plan Note (Signed)
Concern for bacterial cause given no improvement in significant wheezing on exam Omnicef twice daily times 10 days Depo-Medrol 80 mg IM x1 for wheezing Over-the-counter cold medications as needed Call if no improvement

## 2017-03-28 NOTE — Assessment & Plan Note (Signed)
TSH is normal range Continue current dose of medication

## 2017-03-28 NOTE — Assessment & Plan Note (Signed)
Lab Results  Component Value Date   HGBA1C 8.7 (H) 03/26/2017   Sugars poorly controlled Discussed adding another medication, but he does not want to do that now Will increase Prandin to 2 mg 3 times daily before meals For now will increase glimepiride to 8 mg daily and continue metformin at current dose Advised him to consider farxiga or possibly Trulicity depending on coverage He will monitor sugars at home Follow-up in 3 months

## 2017-03-28 NOTE — Progress Notes (Signed)
PCP: Binnie Rail, MD   Primary EP: Dr Vick Frees is a 72 y.o. male who presents today for routine electrophysiology followup.  Since last being seen in our clinic, the patient reports doing very well.  Today, he denies symptoms of palpitations, chest pain, shortness of breath,  lower extremity edema, dizziness, presyncope, or syncope.  The patient is otherwise without complaint today.   Past Medical History:  Diagnosis Date  . AF (atrial fibrillation) (HCC)    persistent afib, s/p afib ablation x 2 with subsequent convergent procedure at [facility]  . Allergic rhinitis    seasonal  . Atrial flutter ([place])   . CTS (carpal tunnel syndrome)    PMH of  . Diabetes mellitus    type 2  . Hyperlipidemia   . Hypertension   . Hypothyroidism   . Renal calculus 2005   [facility]   Past Surgical History:  Procedure Laterality Date  . ABLATION  06/2013   atypical atrial flutter ablation by Dr Lehman Prom at [facility] (mitral annular flutter ablated)  . afib 09/22/2008--11/03/2009     afib ablation x 2 by Dr Rayann Heman with subsequent convergent procedure at [facility]  . CARDIAC CATHETERIZATION  2005   non obstructive CAD  . CARDIOVERSION    . CARDIOVERSION  12/09/2010   Procedure: CARDIOVERSION;  Surgeon: Loralie Champagne, MD;  Location: [place];  Service: Cardiovascular;  Laterality: N/A;  . CARDIOVERSION  09/15/2011   Procedure: CARDIOVERSION;  Surgeon: Josue Hector, MD;  Location: [place];  Service: Cardiovascular;  Laterality: N/A;  office drawing labs wed 8/28  . CARDIOVERSION N/A 07/18/2012   Procedure: CARDIOVERSION;  Surgeon: Thayer Headings, MD;  Location: [place];  Service: Cardiovascular;  Laterality: N/A;  . CARDIOVERSION  08/2014   [place]  . COLONOSCOPY  2009   Dr Sharlett Iles  . convergent procedure  2013   at [facility] for a fib  . JOINT REPLACEMENT Right 2008, 2012  . LOOP RECORDER IMPLANT Left 12-2010  . TONSILLECTOMY    . TOTAL HIP ARTHROPLASTY  06/2010   Depuy  prosthesis replacement, Dr Alvan Dame  . TOTAL HIP ARTHROPLASTY Right 2008    Depuy;Dr Alvan Dame    ROS- all systems are reviewed and negatives except as per HPI above  Current Outpatient Medications  Medication Sig Dispense Refill  . amLODipine (NORVASC) 10 MG tablet Take 1 tablet (10 mg total) by mouth daily. 90 tablet 1  . atorvastatin (LIPITOR) 10 MG tablet Take 1 tablet (10 mg total) by mouth daily. 90 tablet 1  . cefdinir (OMNICEF) 300 MG capsule Take 1 capsule (300 mg total) by mouth 2 (two) times daily. 20 capsule 0  . fluticasone (FLONASE) 50 MCG/ACT nasal spray Place 2 sprays into both nostrils daily. 16 g 6  . glimepiride (AMARYL) 4 MG tablet Take 1 tablet (4 mg total) by mouth daily before breakfast. 90 tablet 1  . guaiFENesin (MUCINEX) 600 MG 12 hr tablet Take 1 tablet (600 mg total) by mouth 2 (two) times daily as needed for cough or to loosen phlegm. 14 tablet 0  . latanoprost (XALATAN) 0.005 % ophthalmic solution Place 1 drop into the left eye at bedtime.    Marland Kitchen levothyroxine (SYNTHROID, LEVOTHROID) 75 MCG tablet Take 1 tablet (75 mcg total) by mouth daily. 90 tablet 1  . Magnesium Oxide 500 MG CAPS Take 1 capsule by mouth 2 (two) times daily.    . metFORMIN (GLUCOPHAGE-XR) 500 MG 24 hr tablet TAKE  2 TABLETS (1,000MG )   TWO TIMES A DAY WITH MEALS 360 tablet 1  . metoprolol succinate (TOPROL-XL) 100 MG 24 hr tablet Take 1 tablet twice daily and an extra 1/2 tablet daily as needed for atrial fibrillation. Please keep upcoming appt. Thank you 225 tablet 0  . metoprolol tartrate (LOPRESSOR) 50 MG tablet Take 1 tablet every 4 hours as needed (Fast Heart Rate). Please keep upcoming appt for future refills. Thank you 540 tablet 0  . quinapril (ACCUPRIL) 40 MG tablet Take 1 tablet (40 mg total) by mouth daily. 90 tablet 1  . repaglinide (PRANDIN) 2 MG tablet Take 1 tablet (2 mg total) by mouth 3 (three) times daily before meals. Take 15-30 min prior to meal 270 tablet 3  . rivaroxaban (XARELTO) 20  MG TABS tablet Take 1 tablet (20 mg total) by mouth daily with supper. 90 tablet 0   No current facility-administered medications for this visit.     Physical Exam: Vitals:   03/28/17 1224  BP: 132/70  Pulse: (!) 58  Weight: 190 lb (86.2 kg)  Height: 5\' 8"  (1.727 m)    GEN- The patient is well appearing, alert and oriented x 3 today.   Head- normocephalic, atraumatic Eyes-  Sclera clear, conjunctiva pink Ears- hearing intact Oropharynx- clear Lungs- Clear to ausculation bilaterally, normal work of breathing Heart- Regular rate and rhythm, no murmurs, rubs or gallops, PMI not laterally displaced GI- soft, NT, ND, + BS Extremities- no clubbing, cyanosis, or edema  EKG tracing ordered today is personally reviewed and shows sinus rhythm 58 bpm, PR 178 msec, incomplete RBBB, Qtc 412 msec  Assessment and Plan:  1. Persistent afib/ atypical atrial flutter Doing great at this time On xarelto for chads2vasc score of 3 ILR no longer functional.  Consider removing once we can take these out in the office.  2. HTN Stable No change required today  Return to see me in a year  Thompson Grayer MD, [facility] 03/28/2017 12:49 PM

## 2017-03-28 NOTE — Assessment & Plan Note (Signed)
Diffuse expiratory wheeze likely related to bacterial infection Omnicef twice daily times 10 days Depo-Medrol 80 mg IM x1 Over-the-counter cold medications for symptom relief Call if no improvement

## 2017-03-28 NOTE — Patient Instructions (Addendum)
We can consider farxiga for your diabetes, which is a pill.  We can also consider a once a week injection - trulicity or ozempic.      Medications reviewed and updated.  Changes include prandin increasing to 2 mg before meals.  Take the antibiotic as prescribed.    Your prescription(s) have been submitted to your pharmacy. Please take as directed and contact our office if you believe you are having problem(s) with the medication(s).  Please followup in 3 months

## 2017-03-28 NOTE — Assessment & Plan Note (Signed)
BP Readings from Last 3 Encounters:  03/28/17 132/70  03/28/17 (!) 150/62  12/22/16 112/76   Blood pressure elevated here today, but overall well controlled Continue current medications at current doses

## 2017-03-28 NOTE — Patient Instructions (Signed)
Medication Instructions:  Your physician recommends that you continue on your current medications as directed. Please refer to the Current Medication list given to you today.  Labwork: None ordered  Testing/Procedures: None ordered  Follow-Up: Your physician wants you to follow-up in: 1 year with Dr. Rayann Heman.  You will receive a reminder letter in the mail two months in advance. If you don't receive a letter, please call our office to schedule the follow-up appointment.  * If you need a refill on your cardiac medications before your next appointment, please call your pharmacy.   *Please note that any paperwork needing to be filled out by the provider will need to be addressed at the front desk prior to seeing the provider. Please note that any FMLA, disability or other documents regarding health condition is subject to a $25.00 charge that must be received prior to completion of paperwork in the form of a money order or check.  Thank you for choosing [facility]!!

## 2017-05-01 ENCOUNTER — Encounter: Payer: Self-pay | Admitting: Gastroenterology

## 2017-05-14 ENCOUNTER — Telehealth: Payer: Self-pay | Admitting: Internal Medicine

## 2017-05-14 DIAGNOSIS — H4052X1 Glaucoma secondary to other eye disorders, left eye, mild stage: Secondary | ICD-10-CM | POA: Diagnosis not present

## 2017-05-14 DIAGNOSIS — E113293 Type 2 diabetes mellitus with mild nonproliferative diabetic retinopathy without macular edema, bilateral: Secondary | ICD-10-CM | POA: Diagnosis not present

## 2017-05-14 NOTE — Telephone Encounter (Signed)
Hi Dr. Hilarie Fredrickson, this is a former pt of Dr. Sharlett Iles. He is due for a colon and would like to continue his care with you. He mentioned that his wife Crosby Oriordan is a pt of yours. Please advice if it is ok to schedule pt with you. Thank you.

## 2017-05-14 NOTE — Telephone Encounter (Signed)
ok 

## 2017-05-15 LAB — HM DIABETES EYE EXAM

## 2017-05-16 ENCOUNTER — Encounter: Payer: Self-pay | Admitting: Internal Medicine

## 2017-05-16 NOTE — Telephone Encounter (Signed)
Appt scheduled on 07/12/17 at 2:30pm

## 2017-05-18 ENCOUNTER — Encounter: Payer: Self-pay | Admitting: Internal Medicine

## 2017-06-22 ENCOUNTER — Ambulatory Visit: Payer: Medicare Other | Admitting: Internal Medicine

## 2017-06-26 ENCOUNTER — Ambulatory Visit (INDEPENDENT_AMBULATORY_CARE_PROVIDER_SITE_OTHER): Payer: Medicare Other | Admitting: *Deleted

## 2017-06-26 VITALS — BP 136/64 | HR 69 | Resp 18 | Ht 68.0 in | Wt 193.0 lb

## 2017-06-26 DIAGNOSIS — Z0000 Encounter for general adult medical examination without abnormal findings: Secondary | ICD-10-CM

## 2017-06-26 DIAGNOSIS — E1151 Type 2 diabetes mellitus with diabetic peripheral angiopathy without gangrene: Secondary | ICD-10-CM | POA: Diagnosis not present

## 2017-06-26 LAB — GLUCOSE, POCT (MANUAL RESULT ENTRY): POC Glucose: 176 mg/dl — AB (ref 70–99)

## 2017-06-26 NOTE — Patient Instructions (Signed)
Continue doing brain stimulating activities (puzzles, reading, adult coloring books, staying active) to keep memory sharp.   Continue to eat heart healthy diet (full of fruits, vegetables, whole grains, lean protein, water--limit salt, fat, and sugar intake) and increase physical activity as tolerated.   [name] , Thank you for taking time to come for your Medicare Wellness Visit. I appreciate your ongoing commitment to your health goals. Please review the following plan we discussed and let me know if I can assist you in the future.   These are the goals we discussed: Goals    . Patient Stated     Lose weight by monitoring my diet and eating more veg. Increase my activity by starting to walk again.        This is a list of the screening recommended for you and due dates:  Health Maintenance  Topic Date Due  . Colon Cancer Screening  05/05/2017  . Tetanus Vaccine  03/29/2018*  . Flu Shot  08/16/2017  . Hemoglobin A1C  09/26/2017  . Complete foot exam   12/22/2017  . Eye exam for diabetics  05/16/2018  .  Hepatitis C: One time screening is recommended by Center for Disease Control  (CDC) for  adults born from [birth year] through 1964.   Completed  . Pneumonia vaccines  Completed  *Topic was postponed. The date shown is not the original due date.

## 2017-06-26 NOTE — Progress Notes (Addendum)
Subjective:   [name] is a 72 y.o. male who presents for Medicare Annual/Subsequent preventive examination.  Review of Systems:  No ROS.  Medicare Wellness Visit. Additional risk factors are reflected in the social history.  Cardiac Risk Factors include: advanced age (>26men, >58 women);diabetes mellitus;hypertension;male gender Sleep patterns: gets up 2 times nightly to void and sleeps 6 hours nightly.    Home Safety/Smoke Alarms: Feels safe in home. Smoke alarms in place.  Living environment; residence and Firearm Safety: 1-story house/ trailer, no firearms Lives with wife, no needs for DME, good support system. Seat Belt Safety/Bike Helmet: Wears seat belt.    Objective:    Vitals: BP 136/64   Pulse 69   Resp 18   Ht 5\' 8"  (1.727 m)   Wt 193 lb (87.5 kg)   SpO2 99%   BMI 29.35 kg/m   Body mass index is 29.35 kg/m.  Advanced Directives 06/26/2017 02/04/2016 07/18/2012 09/15/2011 12/09/2010  Does Patient Have a Medical Advance Directive? Yes No Patient does not have advance directive Patient does not have advance directive -  Type of Advance Directive [place];Living will - - - -  Copy of [place] in Chart? No - copy requested - - - -  Pre-existing out of facility DNR order (yellow form or pink MOST form) - - No No No    Tobacco Social History   Tobacco Use  Smoking Status Former Smoker  . Packs/day: 0.90  . Years: 8.00  . Pack years: 7.20  . Types: Cigarettes  . Last attempt to quit: 01/16/1974  . Years since quitting: 43.4  Smokeless Tobacco Never Used  Tobacco Comment   smoked  1966- 1976,up to 1 ppd     Counseling given: Not Answered Comment: smoked  1966- 1976,up to 1 ppd  Past Medical History:  Diagnosis Date  . AF (atrial fibrillation) (HCC)    persistent afib, s/p afib ablation x 2 with subsequent convergent procedure at [facility]  . Allergic rhinitis    seasonal  . Atrial flutter ([place])   . CTS (carpal tunnel  syndrome)    PMH of  . Diabetes mellitus    type 2  . Hyperlipidemia   . Hypertension   . Hypothyroidism   . Renal calculus 2005   [facility]   Past Surgical History:  Procedure Laterality Date  . ABLATION  06/2013   atypical atrial flutter ablation by Dr Lehman Prom at [facility] (mitral annular flutter ablated)  . afib 09/22/2008--11/03/2009     afib ablation x 2 by Dr Rayann Heman with subsequent convergent procedure at [facility]  . CARDIAC CATHETERIZATION  2005   non obstructive CAD  . CARDIOVERSION    . CARDIOVERSION  12/09/2010   Procedure: CARDIOVERSION;  Surgeon: Loralie Champagne, MD;  Location: [place];  Service: Cardiovascular;  Laterality: N/A;  . CARDIOVERSION  09/15/2011   Procedure: CARDIOVERSION;  Surgeon: Josue Hector, MD;  Location: [place];  Service: Cardiovascular;  Laterality: N/A;  office drawing labs wed 8/28  . CARDIOVERSION N/A 07/18/2012   Procedure: CARDIOVERSION;  Surgeon: Thayer Headings, MD;  Location: [place];  Service: Cardiovascular;  Laterality: N/A;  . CARDIOVERSION  08/2014   [place]  . COLONOSCOPY  2009   Dr Sharlett Iles  . convergent procedure  2013   at [facility] for a fib  . JOINT REPLACEMENT Right 2008, 2012  . LOOP RECORDER IMPLANT Left 12-2010  . TONSILLECTOMY    . TOTAL HIP  ARTHROPLASTY  06/2010   Depuy prosthesis replacement, Dr Alvan Dame  . TOTAL HIP ARTHROPLASTY Right 2008    Depuy;Dr Alvan Dame   Family History  Problem Relation Age of Onset  . Diabetes Father   . Stroke Father        mid 52s  . Lung cancer Mother        non smoker  . Stroke Mother 64  . Testicular cancer Brother   . Allergies Maternal Grandmother   . Cancer Maternal Grandmother        unsure  . Allergies Brother   . Heart attack Neg Hx    Social History   Socioeconomic History  . Marital status: Married    Spouse name: Izora Gala  . Number of children: Y  . Years of education: Not on file  . Highest education level: Not on file  Occupational History  . 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  Social Needs  . Financial resource strain: Not hard at all  . Food insecurity:    Worry: Never true    Inability: Never true  . Transportation needs:    Medical: No    Non-medical: No  Tobacco Use  . Smoking status: Former Smoker    Packs/day: 0.90    Years: 8.00    Pack years: 7.20    Types: Cigarettes    Last attempt to quit: 01/16/1974    Years since quitting: 43.4  . Smokeless tobacco: Never Used  . Tobacco comment: smoked  1966- 1976,up to 1 ppd  Substance and Sexual Activity  . Alcohol use: Yes    Alcohol/week: 0.0 oz    Comment: Socially; "sips socially"( essentially none)  . Drug use: No  . Sexual activity: Yes  Lifestyle  . Physical activity:    Days per week: 4 days    Minutes per session: 50 min  . Stress: Only a little  Relationships  . Social connections:    Talks on phone: More than three times a week    Gets together: More than three times a week    Attends religious service: Not on file    Active member of club or organization: Not on file    Attends meetings of clubs or organizations: Not on file    Relationship status: Married  Other Topics Concern  . Not on file  Social History Narrative  . Not on file    Outpatient Encounter Medications as of 06/26/2017  Medication Sig  . amLODipine (NORVASC) 10 MG tablet Take 1 tablet (10 mg total) by mouth daily.  Marland Kitchen atorvastatin (LIPITOR) 10 MG tablet Take 1 tablet (10 mg total) by mouth daily.  . fluticasone (FLONASE) 50 MCG/ACT nasal spray Place 2 sprays into both nostrils daily.  Marland Kitchen glimepiride (AMARYL) 4 MG tablet Take 1 tablet (4 mg total) by mouth daily before breakfast.  . latanoprost (XALATAN) 0.005 % ophthalmic solution Place 1 drop into the left eye at bedtime.  Marland Kitchen levothyroxine (SYNTHROID, LEVOTHROID) 75 MCG tablet Take 1 tablet (75 mcg total) by mouth daily.  . Magnesium Oxide 500 MG CAPS Take 1 capsule by mouth 2 (two) times daily.  . metFORMIN (GLUCOPHAGE-XR) 500  MG 24 hr tablet TAKE 2 TABLETS (1,000MG )   TWO TIMES A DAY WITH MEALS  . metoprolol succinate (TOPROL-XL) 100 MG 24 hr tablet Take 1 tablet twice daily and an extra 1/2 tablet daily as needed for atrial fibrillation. Please keep upcoming appt. Thank you  .  metoprolol tartrate (LOPRESSOR) 50 MG tablet Take 1 tablet every 4 hours as needed (Fast Heart Rate). Please keep upcoming appt for future refills. Thank you  . quinapril (ACCUPRIL) 40 MG tablet Take 1 tablet (40 mg total) by mouth daily.  . repaglinide (PRANDIN) 2 MG tablet Take 1 tablet (2 mg total) by mouth 3 (three) times daily before meals. Take 15-30 min prior to meal  . rivaroxaban (XARELTO) 20 MG TABS tablet Take 1 tablet (20 mg total) by mouth daily with supper.  . [DISCONTINUED] cefdinir (OMNICEF) 300 MG capsule Take 1 capsule (300 mg total) by mouth 2 (two) times daily. (Patient not taking: Reported on 06/26/2017)  . [DISCONTINUED] guaiFENesin (MUCINEX) 600 MG 12 hr tablet Take 1 tablet (600 mg total) by mouth 2 (two) times daily as needed for cough or to loosen phlegm. (Patient not taking: Reported on 06/26/2017)   No facility-administered encounter medications on file as of 06/26/2017.     Activities of Daily Living In your present state of health, do you have any difficulty performing the following activities: 06/26/2017  Hearing? N  Vision? N  Difficulty concentrating or making decisions? N  Walking or climbing stairs? N  Dressing or bathing? N  Doing errands, shopping? N  Preparing Food and eating ? N  Using the Toilet? N  In the past six months, have you accidently leaked urine? N  Do you have problems with loss of bowel control? N  Managing your Medications? N  Managing your Finances? N  Housekeeping or managing your Housekeeping? N  Some recent data might be hidden    Patient Care Team: Binnie Rail, MD as PCP - General (Internal Medicine) Thompson Grayer, MD as PCP - Cardiology (Cardiology)   Assessment:   This is  a routine wellness examination for Kiing. Physical assessment deferred to PCP.   Exercise Activities and Dietary recommendations Current Exercise Habits: Home exercise routine, Type of exercise: walking, Time (Minutes): 40, Frequency (Times/Week): 3, Weekly Exercise (Minutes/Week): 120, Intensity: Mild, Exercise limited by: None identified  Diet (meal preparation, eat out, water intake, caffeinated beverages, dairy products, fruits and vegetables): in general, a "healthy" diet  , well balanced   Reviewed heart healthy and diabetic diet, Encouraged patient to increase daily water and fluid intake. Relevant patient education assigned to patient using Emmi.   Goals    . Patient Stated     Lose weight by monitoring my diet and eating more veg. Increase my activity by starting to walk again.        Fall Risk Fall Risk  06/26/2017 11/09/2016 09/28/2015 09/10/2015 05/12/2014  Falls in the past year? Yes No No No No  Comment - - - Emmi Telephone Survey: data to providers prior to load -    Depression Screen PHQ 2/9 Scores 06/26/2017 11/09/2016 09/28/2015 05/12/2014  PHQ - 2 Score 0 0 0 0  PHQ- 9 Score 1 - - -    Cognitive Function       Ad8 score reviewed for issues:  Issues making decisions: no  Less interest in hobbies / activities: no  Repeats questions, stories (family complaining): no  Trouble using ordinary gadgets (microwave, computer, phone):no  Forgets the month or year: no  Mismanaging finances: no  Remembering appts: no  Daily problems with thinking and/or memory: no Ad8 score is= 0  Immunization History  Administered Date(s) Administered  . Hepatitis A 03/27/2000, 10/16/2000  . Influenza Whole 10/17/2011  . Influenza, High Dose Seasonal PF 09/28/2015  .  Influenza,inj,Quad PF,6+ Mos 10/16/2012, 09/29/2014, 10/13/2016  . Influenza-Unspecified 10/30/2013  . Pneumococcal Conjugate-13 02/03/2015  . Pneumococcal Polysaccharide-23 04/11/2013  . Tdap 09/21/2006    . Typhoid Inactivated 03/27/2000  . Typhoid Live 10/19/2003  . Zoster 01/05/2010    Screening Tests Health Maintenance  Topic Date Due  . COLONOSCOPY  05/05/2017  . TETANUS/TDAP  03/29/2018 (Originally 09/20/2016)  . INFLUENZA VACCINE  08/16/2017  . HEMOGLOBIN A1C  09/26/2017  . FOOT EXAM  12/22/2017  . OPHTHALMOLOGY EXAM  05/16/2018  . Hepatitis C Screening  Completed  . PNA vac Low Risk Adult  Completed      Plan:   Verbal order received for CBG monitoring of patient.   Continue doing brain stimulating activities (puzzles, reading, adult coloring books, staying active) to keep memory sharp.   Continue to eat heart healthy diet (full of fruits, vegetables, whole grains, lean protein, water--limit salt, fat, and sugar intake) and increase physical activity as tolerated.  I have personally reviewed and noted the following in the patient's chart:   . Medical and social history . Use of alcohol, tobacco or illicit drugs  . Current medications and supplements . Functional ability and status . Nutritional status . Physical activity . Advanced directives . List of other physicians . Vitals . Screenings to include cognitive, depression, and falls . Referrals and appointments  In addition, I have reviewed and discussed with patient certain preventive protocols, quality metrics, and best practice recommendations. A written personalized care plan for preventive services as well as general preventive health recommendations were provided to patient.     Michiel Cowboy, RN  06/26/2017   Medical screening examination/treatment/procedure(s) were performed by non-physician practitioner and as supervising physician I was immediately available for consultation/collaboration. I agree with above. Binnie Rail, MD

## 2017-06-27 ENCOUNTER — Encounter: Payer: Self-pay | Admitting: *Deleted

## 2017-06-27 ENCOUNTER — Encounter: Payer: Self-pay | Admitting: Internal Medicine

## 2017-07-12 ENCOUNTER — Encounter: Payer: Self-pay | Admitting: Internal Medicine

## 2017-07-12 ENCOUNTER — Telehealth: Payer: Self-pay | Admitting: *Deleted

## 2017-07-12 ENCOUNTER — Ambulatory Visit (INDEPENDENT_AMBULATORY_CARE_PROVIDER_SITE_OTHER): Payer: Medicare Other | Admitting: Internal Medicine

## 2017-07-12 VITALS — BP 142/84 | HR 76 | Ht 68.0 in | Wt 187.5 lb

## 2017-07-12 DIAGNOSIS — Z1211 Encounter for screening for malignant neoplasm of colon: Secondary | ICD-10-CM

## 2017-07-12 DIAGNOSIS — Z7901 Long term (current) use of anticoagulants: Secondary | ICD-10-CM | POA: Diagnosis not present

## 2017-07-12 NOTE — Progress Notes (Signed)
Patient ID: [name], male   DOB: Feb 12, 1945, 72 y.o.   MRN: [id number] HPI: [name] is a 72 year old male with a past medical history of atrial fibrillation status post ablation on Xarelto, diabetes, hypertension, hyperlipidemia and hypothyroidism who is seen to discuss  repeat screening colonoscopy.  He is here today with his wife.  He states that he is feeling well.  He denies specific GI complaint.  When questioned he does report he has rare heartburn.  No alarm symptoms including no dysphagia or odynophagia.  Good appetite.  No weight loss.  No change in bowel habit.  No blood in his stool or melena.  No family history of colon cancer.  His last colonoscopy was on 05/06/2007 performed by Dr. Sharlett Iles.  This revealed hemorrhoids and a 1 mm hyperplastic nodule in the rectum.  Exam was otherwise normal.  10-year recall was recommended.  Past Medical History:  Diagnosis Date  . AF (atrial fibrillation) (HCC)    persistent afib, s/p afib ablation x 2 with subsequent convergent procedure at [facility]  . Allergic rhinitis    seasonal  . Atrial flutter ([place])   . CTS (carpal tunnel syndrome)    PMH of  . Diabetes mellitus    type 2  . External hemorrhoids   . Hyperlipidemia   . Hypertension   . Hypothyroidism   . Renal calculus 2005   [facility]    Past Surgical History:  Procedure Laterality Date  . ABLATION  06/2013   atypical atrial flutter ablation by Dr Lehman Prom at [facility] (mitral annular flutter ablated)  . afib 09/22/2008--11/03/2009     afib ablation x 2 by Dr Rayann Heman with subsequent convergent procedure at [facility]  . CARDIAC CATHETERIZATION  2005   non obstructive CAD  . CARDIOVERSION    . CARDIOVERSION  12/09/2010   Procedure: CARDIOVERSION;  Surgeon: Loralie Champagne, MD;  Location: [place];  Service: Cardiovascular;  Laterality: N/A;  . CARDIOVERSION  09/15/2011   Procedure: CARDIOVERSION;  Surgeon: Josue Hector, MD;  Location: [place];  Service: Cardiovascular;   Laterality: N/A;  office drawing labs wed 8/28  . CARDIOVERSION N/A 07/18/2012   Procedure: CARDIOVERSION;  Surgeon: Thayer Headings, MD;  Location: [place];  Service: Cardiovascular;  Laterality: N/A;  . CARDIOVERSION  08/2014   [place]  . COLONOSCOPY  2009   Dr Sharlett Iles  . convergent procedure  2013   at [facility] for a fib  . JOINT REPLACEMENT Right 2008, 2012  . LOOP RECORDER IMPLANT Left 12-2010  . TONSILLECTOMY    . TOTAL HIP ARTHROPLASTY  06/2010   Depuy prosthesis replacement, Dr Alvan Dame  . TOTAL HIP ARTHROPLASTY Right 2008    Depuy;Dr Alvan Dame    Outpatient Medications Prior to Visit  Medication Sig Dispense Refill  . amLODipine (NORVASC) 10 MG tablet Take 1 tablet (10 mg total) by mouth daily. 90 tablet 1  . atorvastatin (LIPITOR) 10 MG tablet Take 1 tablet (10 mg total) by mouth daily. 90 tablet 1  . fluticasone (FLONASE) 50 MCG/ACT nasal spray Place 2 sprays into both nostrils daily. 16 g 6  . glimepiride (AMARYL) 4 MG tablet Take 1 tablet (4 mg total) by mouth daily before breakfast. 90 tablet 1  . latanoprost (XALATAN) 0.005 % ophthalmic solution Place 1 drop into the left eye at bedtime.    Marland Kitchen levothyroxine (SYNTHROID, LEVOTHROID) 75 MCG tablet Take 1 tablet (75 mcg total) by mouth daily. 90 tablet 1  . Magnesium Oxide 500  MG CAPS Take 1 capsule by mouth 2 (two) times daily.    . metFORMIN (GLUCOPHAGE-XR) 500 MG 24 hr tablet TAKE 2 TABLETS (1,000MG )   TWO TIMES A DAY WITH MEALS 360 tablet 1  . metoprolol succinate (TOPROL-XL) 100 MG 24 hr tablet Take 1 tablet twice daily and an extra 1/2 tablet daily as needed for atrial fibrillation. Please keep upcoming appt. Thank you 225 tablet 0  . metoprolol tartrate (LOPRESSOR) 50 MG tablet Take 1 tablet every 4 hours as needed (Fast Heart Rate). Please keep upcoming appt for future refills. Thank you 540 tablet 0  . quinapril (ACCUPRIL) 40 MG tablet Take 1 tablet (40 mg total) by mouth daily. 90 tablet 1  . repaglinide (PRANDIN) 2 MG  tablet Take 1 tablet (2 mg total) by mouth 3 (three) times daily before meals. Take 15-30 min prior to meal 270 tablet 3  . rivaroxaban (XARELTO) 20 MG TABS tablet Take 1 tablet (20 mg total) by mouth daily with supper. 90 tablet 3   No facility-administered medications prior to visit.     No Known Allergies  Family History  Problem Relation Age of Onset  . Diabetes Father   . Stroke Father        mid 61s  . Lung cancer Mother        non smoker  . Stroke Mother 43  . Testicular cancer Brother   . Allergies Maternal Grandmother   . Cancer Maternal Grandmother        unsure  . Allergies Brother   . Heart attack Neg Hx     Social History   Tobacco Use  . Smoking status: Former Smoker    Packs/day: 0.90    Years: 8.00    Pack years: 7.20    Types: Cigarettes    Last attempt to quit: 01/16/1974    Years since quitting: 43.5  . Smokeless tobacco: Never Used  . Tobacco comment: smoked  1966- 1976,up to 1 ppd  Substance Use Topics  . Alcohol use: Yes    Alcohol/week: 0.0 oz    Comment: Socially; "sips socially"( essentially none)  . Drug use: No    ROS: As per history of present illness, otherwise negative  BP (!) 142/84   Pulse 76   Ht 5\' 8"  (1.727 m)   Wt 187 lb 8 oz (85 kg)   BMI 28.51 kg/m  Constitutional: Well-developed and well-nourished. No distress. HEENT: Normocephalic and atraumatic. Oropharynx is clear and moist. Conjunctivae are normal.  No scleral icterus. Neck: Neck supple. Trachea midline. Cardiovascular: Normal rate, regular rhythm and intact distal pulses. No M/R/G Pulmonary/chest: Effort normal and breath sounds normal. No wheezing, rales or rhonchi. Abdominal: Soft, nontender, nondistended. Bowel sounds active throughout. There are no masses palpable. No hepatosplenomegaly. Extremities: no clubbing, cyanosis, or edema Neurological: Alert and oriented to person place and time. Skin: Skin is warm and dry.  Psychiatric: Normal mood and affect.  Behavior is normal.  RELEVANT LABS AND IMAGING: CBC    Component Value Date/Time   WBC 6.7 03/26/2017 0927   RBC 4.98 03/26/2017 0927   HGB 14.7 03/26/2017 0927   HCT 43.4 03/26/2017 0927   PLT 218.0 03/26/2017 0927   MCV 87.1 03/26/2017 0927   MCH 29.8 07/28/2015 1617   MCHC 33.9 03/26/2017 0927   RDW 14.2 03/26/2017 0927   LYMPHSABS 1.8 03/26/2017 0927   MONOABS 0.8 03/26/2017 0927   EOSABS 0.5 03/26/2017 0927   BASOSABS 0.1 03/26/2017 7414  CMP     Component Value Date/Time   NA 141 03/26/2017 0927   K 4.9 03/26/2017 0927   CL 105 03/26/2017 0927   CO2 25 03/26/2017 0927   GLUCOSE 172 (H) 03/26/2017 0927   BUN 19 03/26/2017 0927   CREATININE 0.99 03/26/2017 0927   CREATININE 1.14 07/28/2015 1617   CALCIUM 9.9 03/26/2017 0927   PROT 7.1 03/26/2017 0927   ALBUMIN 4.2 03/26/2017 0927   AST 19 03/26/2017 0927   ALT 29 03/26/2017 0927   ALKPHOS 41 03/26/2017 0927   BILITOT 0.5 03/26/2017 0927   GFRNONAA 60 (L) 04/23/2015 1050   GFRAA >60 04/23/2015 1050    ASSESSMENT/PLAN: 72 year old male with a past medical history of atrial fibrillation status post ablation on Xarelto, diabetes, hypertension, hyperlipidemia and hypothyroidism who is seen to discuss  repeat screening colonoscopy.  1.  Screening colonoscopy in the setting of chronic anticoagulation --screening colonoscopy is recommended at this time.  We discussed the risk, benefits and alternatives and he is agreeable to proceed.  We will contact Dr. Rayann Heman to discuss interrupting Xarelto therapy for 2 days prior to his colonoscopy.  Will hold Xarelto to days prior to endoscopic procedures - will instruct when and how to resume after procedure. Benefits and risks of procedure explained including risks of bleeding, perforation, infection, missed lesions, reactions to medications and possible need for hospitalization and surgery for complications. Additional rare but real risk of stroke or other vascular clotting  events off *Xarelto also explained and need to seek urgent help if any signs of these problems occur. Will communicate by phone or EMR with patient's  prescribing provider to confirm that holding Xarelto is reasonable in this case.      BB:CWUGQ, Claudina Lick, Md [address] [place], [place] [address]

## 2017-07-12 NOTE — Telephone Encounter (Signed)
Request for surgical clearance:     Endoscopy Procedure  What type of surgery is being performed?     colonoscopy  When is this surgery scheduled?     xarelto  What type of clearance is required ?   Pharmacy  Are there any medications that need to be held prior to surgery and how long? Xarelto, 2 days  Practice name and name of physician performing surgery?      [facility]  What is your office phone and fax number?      Phone- [phone]  [phone]  Anesthesia type (None, local, MAC, general) ?       MAC

## 2017-07-12 NOTE — Patient Instructions (Addendum)
You have been scheduled for a colonoscopy. Please follow written instructions given to you at your visit today.  Please pick up your prep supplies at the pharmacy within the next 1-3 days. If you use inhalers (even only as needed), please bring them with you on the day of your procedure. Your physician has requested that you go to www.startemmi.com and enter the access code given to you at your visit today. This web site gives a general overview about your procedure. However, you should still follow specific instructions given to you by our office regarding your preparation for the procedure.  Hold amaryl, glucophage and prandin morning of your test.  You will be contacted by our office prior to your procedure for directions on holding your xarelto.  If you do not hear from our office 1 week prior to your scheduled procedure, please call [phone] to discuss.   If you are age 71 or older, your body mass index should be between 23-30. Your Body mass index is 28.51 kg/m. If this is out of the aforementioned range listed, please consider follow up with your Primary Care Provider.  If you are age 70 or younger, your body mass index should be between 19-25. Your Body mass index is 28.51 kg/m. If this is out of the aformentioned range listed, please consider follow up with your Primary Care Provider.

## 2017-07-13 NOTE — Telephone Encounter (Addendum)
From Fuller Canada, PharmD:  Pt takes Xarelto for afib with CHADS2VASc score of 3 (age, HTN, DM). Renal function is normal. Ok to hold Xarelto 2 days prior to procedure as requested.  [facility]  Phone- [phone]  [phone]  I will route this to the requesting physician and removed from the preop pool.  Rosaria Ferries, PA-C 07/13/2017 12:47 PM Beeper [phone]

## 2017-07-13 NOTE — Telephone Encounter (Signed)
I have spoken to patient to advise that per cardiology, he may hold xarelto 2 days prior to his colonoscopy procedure. He verbalizes understanding of this.

## 2017-09-03 ENCOUNTER — Encounter: Payer: Self-pay | Admitting: Internal Medicine

## 2017-09-03 ENCOUNTER — Ambulatory Visit (AMBULATORY_SURGERY_CENTER): Payer: Medicare Other | Admitting: Internal Medicine

## 2017-09-03 VITALS — BP 118/71 | HR 60 | Temp 98.6°F | Resp 16 | Ht 68.0 in | Wt 187.0 lb

## 2017-09-03 DIAGNOSIS — D120 Benign neoplasm of cecum: Secondary | ICD-10-CM | POA: Diagnosis not present

## 2017-09-03 DIAGNOSIS — Z1211 Encounter for screening for malignant neoplasm of colon: Secondary | ICD-10-CM

## 2017-09-03 DIAGNOSIS — I10 Essential (primary) hypertension: Secondary | ICD-10-CM | POA: Diagnosis not present

## 2017-09-03 DIAGNOSIS — E119 Type 2 diabetes mellitus without complications: Secondary | ICD-10-CM | POA: Diagnosis not present

## 2017-09-03 DIAGNOSIS — I4891 Unspecified atrial fibrillation: Secondary | ICD-10-CM | POA: Diagnosis not present

## 2017-09-03 MED ORDER — SODIUM CHLORIDE 0.9 % IV SOLN: 500.0000 mL | Freq: Once | INTRAVENOUS | Status: DC

## 2017-09-03 NOTE — Op Note (Signed)
[facility] Patient Name: [name] Procedure Date: 09/03/2017 4:05 PM MRN: [id number] Endoscopist: Jerene Bears , MD Age: 72 Referring MD:  Date of Birth: Mar 12, 1945 Gender: Male Account #: [id number] Procedure:                Colonoscopy Indications:              Screening for colorectal malignant neoplasm, Last                            colonoscopy 10 years ago Medicines:                Monitored Anesthesia Care Procedure:                Pre-Anesthesia Assessment:                           - Prior to the procedure, a History and Physical                            was performed, and patient medications and                            allergies were reviewed. The patient's tolerance of                            previous anesthesia was also reviewed. The risks                            and benefits of the procedure and the sedation                            options and risks were discussed with the patient.                            All questions were answered, and informed consent                            was obtained. Prior Anticoagulants: The patient has                            taken Xarelto (rivaroxaban), last dose was 5 days                            prior to procedure. ASA Grade Assessment: II - A                            patient with mild systemic disease. After reviewing                            the risks and benefits, the patient was deemed in                            satisfactory condition to undergo the procedure.  After obtaining informed consent, the colonoscope                            was passed under direct vision. Throughout the                            procedure, the patient's blood pressure, pulse, and                            oxygen saturations were monitored continuously. The                            Colonoscope was introduced through the anus and                            advanced to the cecum,  identified by appendiceal                            orifice and ileocecal valve. The colonoscopy was                            performed without difficulty. The patient tolerated                            the procedure well. The quality of the bowel                            preparation was excellent. The ileocecal valve,                            appendiceal orifice, and rectum were photographed.                            The bowel preparation used was PlenVu. Scope In: 4:12:49 PM Scope Out: 4:24:13 PM Scope Withdrawal Time: 0 hours 7 minutes 43 seconds  Total Procedure Duration: 0 hours 11 minutes 24 seconds  Findings:                 The digital rectal exam was normal.                           A 1 mm polyp was found in the cecum. The polyp was                            sessile. The polyp was removed with a cold snare.                            Resection and retrieval were complete.                           Internal hemorrhoids were found during                            retroflexion. The hemorrhoids were small.  The exam was otherwise without abnormality. Complications:            No immediate complications. Estimated Blood Loss:     Estimated blood loss was minimal. Impression:               - One 1 mm polyp in the cecum, removed with a cold                            snare. Resected and retrieved.                           - Internal hemorrhoids.                           - The examination was otherwise normal. Recommendation:           - Patient has a contact number available for                            emergencies. The signs and symptoms of potential                            delayed complications were discussed with the                            patient. Return to normal activities tomorrow.                            Written discharge instructions were provided to the                            patient.                           - Resume  previous diet.                           - Continue present medications.                           - Await pathology results.                           - Repeat colonoscopy is recommended. The                            colonoscopy date will be determined after pathology                            results from today's exam become available for                            review.                           - Resume Xarelto (rivaroxaban) at prior dose today.  Refer to managing physician for further adjustment                            of therapy. Jerene Bears, MD 09/03/2017 4:26:58 PM This report has been signed electronically.

## 2017-09-03 NOTE — Progress Notes (Signed)
Called to room to assist during endoscopic procedure.  Patient ID and intended procedure confirmed with present staff. Received instructions for my participation in the procedure from the performing physician.  

## 2017-09-03 NOTE — Patient Instructions (Signed)
Handouts given on polyps and hemorrhoids   YOU HAD AN ENDOSCOPIC PROCEDURE TODAY AT THE [facility]:   Refer to the procedure report that was given to you for any specific questions about what was found during the examination.  If the procedure report does not answer your questions, please call your gastroenterologist to clarify.  If you requested that your care partner not be given the details of your procedure findings, then the procedure report has been included in a sealed envelope for you to review at your convenience later.  YOU SHOULD EXPECT: Some feelings of bloating in the abdomen. Passage of more gas than usual.  Walking can help get rid of the air that was put into your GI tract during the procedure and reduce the bloating. If you had a lower endoscopy (such as a colonoscopy or flexible sigmoidoscopy) you may notice spotting of blood in your stool or on the toilet paper. If you underwent a bowel prep for your procedure, you may not have a normal bowel movement for a few days.  Please Note:  You might notice some irritation and congestion in your nose or some drainage.  This is from the oxygen used during your procedure.  There is no need for concern and it should clear up in a day or so.  SYMPTOMS TO REPORT IMMEDIATELY:   Following lower endoscopy (colonoscopy or flexible sigmoidoscopy):  Excessive amounts of blood in the stool  Significant tenderness or worsening of abdominal pains  Swelling of the abdomen that is new, acute  Fever of 100F or higher    For urgent or emergent issues, a gastroenterologist can be reached at any hour by calling [phone].   DIET:  We do recommend a small meal at first, but then you may proceed to your regular diet.  Drink plenty of fluids but you should avoid alcoholic beverages for 24 hours.  ACTIVITY:  You should plan to take it easy for the rest of today and you should NOT DRIVE or use heavy machinery until tomorrow (because of  the sedation medicines used during the test).    FOLLOW UP: Our staff will call the number listed on your records the next business day following your procedure to check on you and address any questions or concerns that you may have regarding the information given to you following your procedure. If we do not reach you, we will leave a message.  However, if you are feeling well and you are not experiencing any problems, there is no need to return our call.  We will assume that you have returned to your regular daily activities without incident.  If any biopsies were taken you will be contacted by phone or by letter within the next 1-3 weeks.  Please call us at [phone] if you have not heard about the biopsies in 3 weeks.    SIGNATURES/CONFIDENTIALITY: You and/or your care partner have signed paperwork which will be entered into your electronic medical record.  These signatures attest to the fact that that the information above on your After Visit Summary has been reviewed and is understood.  Full responsibility of the confidentiality of this discharge information lies with you and/or your care-partner. 

## 2017-09-03 NOTE — Progress Notes (Signed)
Report given to PACU, vss 

## 2017-09-04 ENCOUNTER — Telehealth: Payer: Self-pay | Admitting: *Deleted

## 2017-09-04 NOTE — Telephone Encounter (Signed)
  Follow up Call-  Call back number 09/03/2017  Post procedure Call Back phone  # [phone]  Permission to leave phone message Yes  Some recent data might be hidden     Patient questions:  Do you have a fever, pain , or abdominal swelling? No. Pain Score  0 *  Have you tolerated food without any problems? Yes.    Have you been able to return to your normal activities? Yes.    Do you have any questions about your discharge instructions: Diet   No. Medications  No. Follow up visit  No.  Do you have questions or concerns about your Care? No.  Actions: * If pain score is 4 or above: No action needed, pain <4.

## 2017-09-10 ENCOUNTER — Encounter: Payer: Self-pay | Admitting: Internal Medicine

## 2017-09-17 NOTE — Progress Notes (Signed)
Subjective:    Patient ID: [name], male    DOB: 1945-06-23, 72 y.o.   MRN: [id number]  HPI The patient is here for follow up.  Diabetes: He is taking his medication daily as prescribed. He is compliant with a diabetic diet. He is not exercising regularly.  He has not been successful in losing weight.  He monitors his sugars and they have been running 10 day averages - 148, 132, 144, 143, 164, 144, 155, 158.  He is up-to-date with an ophthalmology examination.   Afib, Hypertension: He is taking his medication daily. He is compliant with a low sodium diet.  He denies chest pain, palpitations, edema, shortness of breath and regular headaches. He is not exercising regularly.      Hyperlipidemia: He is taking his medication daily. He is compliant with a low fat/cholesterol diet. He is not exercising regularly. He denies myalgias.   Hypothyroidism:  He is taking his medication daily.  He denies any recent changes in energy or weight that are unexplained.    Medications and allergies reviewed with patient and updated if appropriate.  Patient Active Problem List   Diagnosis Date Noted  . Diabetes mellitus with peripheral circulatory disorder, controlled ([facility]) 11/09/2016  . Lumbar radiculopathy 05/05/2015  . Atypical atrial flutter ([facility]) 09/12/2014  . Obesity 06/25/2013  . Hypothyroidism 12/30/2010  . OSA (obstructive sleep apnea) 09/02/2010  . Atrial fibrillation ([facility]) 04/05/2009  . ERECTILE DYSFUNCTION 05/01/2008  . Hyperlipidemia 09/06/2007  . Hypertension 09/06/2007  . BPH with obstruction/lower urinary tract symptoms 04/25/2007    Current Outpatient Medications on File Prior to Visit  Medication Sig Dispense Refill  . amLODipine (NORVASC) 10 MG tablet Take 1 tablet (10 mg total) by mouth daily. 90 tablet 1  . atorvastatin (LIPITOR) 10 MG tablet Take 1 tablet (10 mg total) by mouth daily. 90 tablet 1  . glimepiride (AMARYL) 4 MG tablet Take 1 tablet (4 mg total) by  mouth daily before breakfast. 90 tablet 1  . latanoprost (XALATAN) 0.005 % ophthalmic solution Place 1 drop into the left eye at bedtime.    Marland Kitchen levothyroxine (SYNTHROID, LEVOTHROID) 75 MCG tablet Take 1 tablet (75 mcg total) by mouth daily. 90 tablet 1  . Magnesium Oxide 500 MG CAPS Take 1 capsule by mouth 2 (two) times daily.    . metFORMIN (GLUCOPHAGE-XR) 500 MG 24 hr tablet TAKE 2 TABLETS (1,000MG )   TWO TIMES A DAY WITH MEALS 360 tablet 1  . metoprolol succinate (TOPROL-XL) 100 MG 24 hr tablet Take 1 tablet twice daily and an extra 1/2 tablet daily as needed for atrial fibrillation. Please keep upcoming appt. Thank you 225 tablet 0  . metoprolol tartrate (LOPRESSOR) 50 MG tablet Take 1 tablet every 4 hours as needed (Fast Heart Rate). Please keep upcoming appt for future refills. Thank you 540 tablet 0  . quinapril (ACCUPRIL) 40 MG tablet Take 1 tablet (40 mg total) by mouth daily. 90 tablet 1  . repaglinide (PRANDIN) 2 MG tablet Take 1 tablet (2 mg total) by mouth 3 (three) times daily before meals. Take 15-30 min prior to meal 270 tablet 3  . rivaroxaban (XARELTO) 20 MG TABS tablet Take 1 tablet (20 mg total) by mouth daily with supper. 90 tablet 3   No current facility-administered medications on file prior to visit.     Past Medical History:  Diagnosis Date  . AF (atrial fibrillation) (HCC)    persistent afib, s/p afib ablation x 2  with subsequent convergent procedure at [facility]  . Allergic rhinitis    seasonal  . Atrial flutter ([place])   . CTS (carpal tunnel syndrome)    PMH of  . Diabetes mellitus    type 2  . External hemorrhoids   . Glaucoma   . Hyperlipidemia   . Hypertension   . Hypothyroidism   . Renal calculus 2005   [facility]    Past Surgical History:  Procedure Laterality Date  . ABLATION  06/2013   atypical atrial flutter ablation by Dr Lehman Prom at [facility] (mitral annular flutter ablated)  . afib 09/22/2008--11/03/2009     afib ablation x 2 by Dr Rayann Heman with  subsequent convergent procedure at [facility]  . CARDIAC CATHETERIZATION  2005   non obstructive CAD  . CARDIOVERSION    . CARDIOVERSION  12/09/2010   Procedure: CARDIOVERSION;  Surgeon: Loralie Champagne, MD;  Location: [facility];  Service: Cardiovascular;  Laterality: N/A;  . CARDIOVERSION  09/15/2011   Procedure: CARDIOVERSION;  Surgeon: Josue Hector, MD;  Location: [facility];  Service: Cardiovascular;  Laterality: N/A;  office drawing labs wed 8/28  . CARDIOVERSION N/A 07/18/2012   Procedure: CARDIOVERSION;  Surgeon: Thayer Headings, MD;  Location: [facility];  Service: Cardiovascular;  Laterality: N/A;  . CARDIOVERSION  08/2014   [place]  . COLONOSCOPY  2009   Dr Sharlett Iles  . convergent procedure  2013   at [facility] for a fib  . JOINT REPLACEMENT Right 2008, 2012  . LOOP RECORDER IMPLANT Left 12-2010  . TONSILLECTOMY    . TOTAL HIP ARTHROPLASTY  06/2010   Depuy prosthesis replacement, Dr Alvan Dame  . TOTAL HIP ARTHROPLASTY Right 2008    Depuy;Dr Alvan Dame    Social History   Socioeconomic History  . Marital status: Married    Spouse name: Izora Gala  . Number of children: Y  . Years of education: Not on file  . Highest education level: Not on file  Occupational History  . 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  Social Needs  . Financial resource strain: Not hard at all  . Food insecurity:    Worry: Never true    Inability: Never true  . Transportation needs:    Medical: No    Non-medical: No  Tobacco Use  . Smoking status: Former Smoker    Packs/day: 0.90    Years: 8.00    Pack years: 7.20    Types: Cigarettes    Last attempt to quit: 01/16/1974    Years since quitting: 43.7  . Smokeless tobacco: Never Used  . Tobacco comment: smoked  1966- 1976,up to 1 ppd  Substance and Sexual Activity  . Alcohol use: Yes    Alcohol/week: 0.0 standard drinks    Comment: Socially; "sips socially"( essentially none)  . Drug use: No  . Sexual activity: Yes  Lifestyle  . Physical  activity:    Days per week: 4 days    Minutes per session: 50 min  . Stress: Only a little  Relationships  . Social connections:    Talks on phone: More than three times a week    Gets together: More than three times a week    Attends religious service: Not on file    Active member of club or organization: Not on file    Attends meetings of clubs or organizations: Not on file    Relationship status: Married  Other Topics Concern  . Not on file  Social  History Narrative  . Not on file    Family History  Problem Relation Age of Onset  . Diabetes Father   . Stroke Father        mid 30s  . Lung cancer Mother        non smoker  . Stroke Mother 55  . Testicular cancer Brother   . Allergies Maternal Grandmother   . Cancer Maternal Grandmother        unsure  . Allergies Brother   . Heart attack Neg Hx     Review of Systems  Constitutional: Negative for chills and fever.  Respiratory: Negative for cough, shortness of breath and wheezing.   Cardiovascular: Negative for chest pain, palpitations and leg swelling.  Neurological: Negative for light-headedness and headaches.       Objective:   Vitals:   09/19/17 0927  BP: 134/72  Pulse: 65  Resp: 16  Temp: 98.2 F (36.8 C)  SpO2: 98%   BP Readings from Last 3 Encounters:  09/19/17 134/72  09/03/17 118/71  07/12/17 (!) 142/84   Wt Readings from Last 3 Encounters:  09/19/17 190 lb (86.2 kg)  09/03/17 187 lb (84.8 kg)  07/12/17 187 lb 8 oz (85 kg)   Body mass index is 28.89 kg/m.   Physical Exam    Constitutional: Appears well-developed and well-nourished. No distress.  HENT:  Head: Normocephalic and atraumatic.  Neck: Neck supple. No tracheal deviation present. No thyromegaly present.  No cervical lymphadenopathy Cardiovascular: Normal rate, regular rhythm and normal heart sounds.   No murmur heard. No carotid bruit .  No edema Pulmonary/Chest: Effort normal and breath sounds normal. No respiratory distress.  No has no wheezes. No rales.  Skin: Skin is warm and dry. Not diaphoretic.  Psychiatric: Normal mood and affect. Behavior is normal.      Assessment & Plan:    See Problem List for Assessment and Plan of chronic medical problems.

## 2017-09-17 NOTE — Patient Instructions (Addendum)

## 2017-09-19 ENCOUNTER — Other Ambulatory Visit (INDEPENDENT_AMBULATORY_CARE_PROVIDER_SITE_OTHER): Payer: Medicare Other

## 2017-09-19 ENCOUNTER — Ambulatory Visit (INDEPENDENT_AMBULATORY_CARE_PROVIDER_SITE_OTHER): Payer: Medicare Other | Admitting: Internal Medicine

## 2017-09-19 ENCOUNTER — Telehealth: Payer: Self-pay | Admitting: *Deleted

## 2017-09-19 ENCOUNTER — Other Ambulatory Visit: Payer: Self-pay | Admitting: Internal Medicine

## 2017-09-19 ENCOUNTER — Encounter: Payer: Self-pay | Admitting: Internal Medicine

## 2017-09-19 VITALS — BP 134/72 | HR 65 | Temp 98.2°F | Resp 16 | Ht 68.0 in | Wt 190.0 lb

## 2017-09-19 DIAGNOSIS — I10 Essential (primary) hypertension: Secondary | ICD-10-CM

## 2017-09-19 DIAGNOSIS — E1151 Type 2 diabetes mellitus with diabetic peripheral angiopathy without gangrene: Secondary | ICD-10-CM | POA: Diagnosis not present

## 2017-09-19 DIAGNOSIS — I481 Persistent atrial fibrillation: Secondary | ICD-10-CM

## 2017-09-19 DIAGNOSIS — I4819 Other persistent atrial fibrillation: Secondary | ICD-10-CM

## 2017-09-19 DIAGNOSIS — E7849 Other hyperlipidemia: Secondary | ICD-10-CM

## 2017-09-19 DIAGNOSIS — E039 Hypothyroidism, unspecified: Secondary | ICD-10-CM | POA: Diagnosis not present

## 2017-09-19 DIAGNOSIS — E875 Hyperkalemia: Secondary | ICD-10-CM

## 2017-09-19 LAB — CBC WITH DIFFERENTIAL/PLATELET
Basophils Absolute: 0.1 10*3/uL (ref 0.0–0.1)
Basophils Relative: 1 % (ref 0.0–3.0)
Eosinophils Absolute: 0.6 10*3/uL (ref 0.0–0.7)
Eosinophils Relative: 9 % — ABNORMAL HIGH (ref 0.0–5.0)
HCT: 44.1 % (ref 39.0–52.0)
Hemoglobin: 15.1 g/dL (ref 13.0–17.0)
Lymphocytes Relative: 26.3 % (ref 12.0–46.0)
Lymphs Abs: 1.7 10*3/uL (ref 0.7–4.0)
MCHC: 34.2 g/dL (ref 30.0–36.0)
MCV: 88.1 fl (ref 78.0–100.0)
Monocytes Absolute: 0.7 10*3/uL (ref 0.1–1.0)
Monocytes Relative: 10.2 % (ref 3.0–12.0)
Neutro Abs: 3.4 10*3/uL (ref 1.4–7.7)
Neutrophils Relative %: 53.5 % (ref 43.0–77.0)
Platelets: 202 10*3/uL (ref 150.0–400.0)
RBC: 5 Mil/uL (ref 4.22–5.81)
RDW: 13.8 % (ref 11.5–15.5)
WBC: 6.4 10*3/uL (ref 4.0–10.5)

## 2017-09-19 LAB — COMPREHENSIVE METABOLIC PANEL
ALT: 19 U/L (ref 0–53)
AST: 18 U/L (ref 0–37)
Albumin: 4.6 g/dL (ref 3.5–5.2)
Alkaline Phosphatase: 38 U/L — ABNORMAL LOW (ref 39–117)
BUN: 24 mg/dL — ABNORMAL HIGH (ref 6–23)
CO2: 27 mEq/L (ref 19–32)
Calcium: 10.5 mg/dL (ref 8.4–10.5)
Chloride: 103 mEq/L (ref 96–112)
Creatinine, Ser: 1.14 mg/dL (ref 0.40–1.50)
GFR: 67.32 mL/min (ref >60.00)
Glucose, Bld: 203 mg/dL — ABNORMAL HIGH (ref 70–99)
Potassium: 6.2 mEq/L (ref 3.5–5.1)
Sodium: 139 mEq/L (ref 135–145)
Total Bilirubin: 0.5 mg/dL (ref 0.2–1.2)
Total Protein: 7.7 g/dL (ref 6.0–8.3)

## 2017-09-19 LAB — LIPID PANEL
Cholesterol: 113 mg/dL (ref 0–200)
HDL: 45.9 mg/dL (ref >39.00)
LDL Cholesterol: 45 mg/dL (ref 0–99)
NonHDL: 67.08
Total CHOL/HDL Ratio: 2
Triglycerides: 109 mg/dL (ref 0.0–149.0)
VLDL: 21.8 mg/dL (ref 0.0–40.0)

## 2017-09-19 LAB — HEMOGLOBIN A1C: Hgb A1c MFr Bld: 7.9 % — ABNORMAL HIGH (ref 4.6–6.5)

## 2017-09-19 LAB — TSH: TSH: 4.03 u[IU]/mL (ref 0.35–4.50)

## 2017-09-19 MED ORDER — ATORVASTATIN CALCIUM 10 MG PO TABS: 10.0000 mg | ORAL_TABLET | Freq: Every day | ORAL | 1 refills | Status: DC

## 2017-09-19 MED ORDER — METOPROLOL SUCCINATE ER 100 MG PO TB24: ORAL_TABLET | ORAL | 0 refills | Status: DC

## 2017-09-19 MED ORDER — METFORMIN HCL ER 500 MG PO TB24: ORAL_TABLET | ORAL | 1 refills | Status: DC

## 2017-09-19 MED ORDER — QUINAPRIL HCL 40 MG PO TABS: 40.0000 mg | ORAL_TABLET | Freq: Every day | ORAL | 1 refills | Status: DC

## 2017-09-19 MED ORDER — LEVOTHYROXINE SODIUM 75 MCG PO TABS: 75.0000 ug | ORAL_TABLET | Freq: Every day | ORAL | 1 refills | Status: DC

## 2017-09-19 NOTE — Assessment & Plan Note (Signed)
Sugars at home still slightly higher than ideal, but look like they might be better overall Will check A1c today He is currently on 3 medications and the other medications are expensive Stressed the importance of weight loss and regular exercise to help control his sugars, avoid more medication and possibly decrease his current medications

## 2017-09-19 NOTE — Assessment & Plan Note (Signed)
Check lipid panel  Continue daily statin Regular exercise and healthy diet encouraged  

## 2017-09-19 NOTE — Assessment & Plan Note (Signed)
BP well controlled Current regimen effective and well tolerated Continue current medications at current doses cmp  

## 2017-09-19 NOTE — Assessment & Plan Note (Signed)
In sinus Asymptomatic Rate controlled Taking Xarelto Check CBC, CMP

## 2017-09-19 NOTE — Progress Notes (Signed)
See result note..  We can try d/c'ing the quinapril and starting hydrochlorothiazide.  Ideally he should monitor his blood pressure to make sure it is controlled.  We can also try increasing the Prandin.  Medication changes her result note - not sure where he wants them sent    Check potassium (bmp) as soon as he is able - this week or when he returns from vacation

## 2017-09-19 NOTE — Telephone Encounter (Signed)
Critical lab- Potassium 6.2.Marland KitchenJohny Peters

## 2017-09-19 NOTE — Assessment & Plan Note (Signed)
Clinically euthyroid Check tsh  Titrate med dose if needed  

## 2017-09-19 NOTE — Telephone Encounter (Signed)
See result note.  

## 2017-09-20 MED ORDER — HYDROCHLOROTHIAZIDE 25 MG PO TABS: 25.0000 mg | ORAL_TABLET | Freq: Every day | ORAL | 3 refills | Status: DC

## 2017-09-20 MED ORDER — REPAGLINIDE 2 MG PO TABS: 4.0000 mg | ORAL_TABLET | Freq: Three times a day (TID) | ORAL | 3 refills | Status: DC

## 2017-09-20 NOTE — Progress Notes (Signed)
Pt aware of response below. Will come back when he is able to get labs drawn. New medications have been sent to the requested pharmacy.

## 2017-09-26 ENCOUNTER — Ambulatory Visit: Payer: Medicare Other | Admitting: Internal Medicine

## 2017-09-27 DIAGNOSIS — Z23 Encounter for immunization: Secondary | ICD-10-CM | POA: Diagnosis not present

## 2017-09-30 ENCOUNTER — Encounter: Payer: Self-pay | Admitting: Internal Medicine

## 2017-10-01 ENCOUNTER — Other Ambulatory Visit (INDEPENDENT_AMBULATORY_CARE_PROVIDER_SITE_OTHER): Payer: Medicare Other

## 2017-10-01 ENCOUNTER — Other Ambulatory Visit: Payer: Self-pay

## 2017-10-01 DIAGNOSIS — E875 Hyperkalemia: Secondary | ICD-10-CM

## 2017-10-01 LAB — BASIC METABOLIC PANEL
BUN: 19 mg/dL (ref 6–23)
CO2: 27 mEq/L (ref 19–32)
Calcium: 10.4 mg/dL (ref 8.4–10.5)
Chloride: 98 mEq/L (ref 96–112)
Creatinine, Ser: 1.02 mg/dL (ref 0.40–1.50)
GFR: 76.53 mL/min (ref >60.00)
Glucose, Bld: 202 mg/dL — ABNORMAL HIGH (ref 70–99)
Potassium: 4.3 mEq/L (ref 3.5–5.1)
Sodium: 136 mEq/L (ref 135–145)

## 2017-10-01 MED ORDER — AMLODIPINE BESYLATE 10 MG PO TABS: 10.0000 mg | ORAL_TABLET | Freq: Every day | ORAL | 1 refills | Status: DC

## 2017-10-02 ENCOUNTER — Ambulatory Visit (HOSPITAL_COMMUNITY)
Admission: RE | Admit: 2017-10-02 | Discharge: 2017-10-02 | Disposition: A | Discharge status: Home / Self Care | Payer: Medicare Other | Source: Ambulatory Visit | Attending: Nurse Practitioner | Admitting: Nurse Practitioner

## 2017-10-02 ENCOUNTER — Encounter (HOSPITAL_COMMUNITY): Payer: Self-pay | Admitting: Nurse Practitioner

## 2017-10-02 VITALS — BP 152/82 | HR 70 | Ht 68.0 in | Wt 188.0 lb

## 2017-10-02 DIAGNOSIS — H409 Unspecified glaucoma: Secondary | ICD-10-CM | POA: Insufficient documentation

## 2017-10-02 DIAGNOSIS — Z7984 Long term (current) use of oral hypoglycemic drugs: Secondary | ICD-10-CM | POA: Diagnosis not present

## 2017-10-02 DIAGNOSIS — I10 Essential (primary) hypertension: Secondary | ICD-10-CM | POA: Insufficient documentation

## 2017-10-02 DIAGNOSIS — Z9889 Other specified postprocedural states: Secondary | ICD-10-CM | POA: Insufficient documentation

## 2017-10-02 DIAGNOSIS — E039 Hypothyroidism, unspecified: Secondary | ICD-10-CM | POA: Diagnosis not present

## 2017-10-02 DIAGNOSIS — I4892 Unspecified atrial flutter: Secondary | ICD-10-CM | POA: Insufficient documentation

## 2017-10-02 DIAGNOSIS — R002 Palpitations: Secondary | ICD-10-CM | POA: Diagnosis not present

## 2017-10-02 DIAGNOSIS — I4510 Unspecified right bundle-branch block: Secondary | ICD-10-CM | POA: Diagnosis not present

## 2017-10-02 DIAGNOSIS — Z79899 Other long term (current) drug therapy: Secondary | ICD-10-CM | POA: Diagnosis not present

## 2017-10-02 DIAGNOSIS — E119 Type 2 diabetes mellitus without complications: Secondary | ICD-10-CM | POA: Insufficient documentation

## 2017-10-02 DIAGNOSIS — Z87442 Personal history of urinary calculi: Secondary | ICD-10-CM | POA: Diagnosis not present

## 2017-10-02 DIAGNOSIS — E785 Hyperlipidemia, unspecified: Secondary | ICD-10-CM | POA: Insufficient documentation

## 2017-10-02 DIAGNOSIS — I481 Persistent atrial fibrillation: Secondary | ICD-10-CM

## 2017-10-02 DIAGNOSIS — Z87891 Personal history of nicotine dependence: Secondary | ICD-10-CM | POA: Diagnosis not present

## 2017-10-02 DIAGNOSIS — Z7901 Long term (current) use of anticoagulants: Secondary | ICD-10-CM | POA: Insufficient documentation

## 2017-10-02 DIAGNOSIS — I4819 Other persistent atrial fibrillation: Secondary | ICD-10-CM

## 2017-10-02 NOTE — Progress Notes (Signed)
Primary Care Physician: Binnie Rail, MD Referring Physician: self referred EP: [name] is a 72 y.o. male with a h/o afib s/p ablation x 2 with convergent procedure. He is in the afib clinic for evaluation for palpitations he noted last week. These were noted last week when he was in the middle of stopping ACE and starting HCTZ for hyperkalemia of 6.2. Since he got on HCTZ , he has felt much better and has not noted nay more palpitations. His Kt now is 4.3. He is planning to cruise to the [place] next week and just wanted to make sure his EKG looked ok.   Today, he denies symptoms of palpitations, chest pain, shortness of breath, orthopnea, PND, lower extremity edema, dizziness, presyncope, syncope, or neurologic sequela. The patient is tolerating medications without difficulties and is otherwise without complaint today.   Past Medical History:  Diagnosis Date  . AF (atrial fibrillation) (HCC)    persistent afib, s/p afib ablation x 2 with subsequent convergent procedure at [facility]  . Allergic rhinitis    seasonal  . Atrial flutter ([place])   . CTS (carpal tunnel syndrome)    PMH of  . Diabetes mellitus    type 2  . External hemorrhoids   . Glaucoma   . Hyperlipidemia   . Hypertension   . Hypothyroidism   . Renal calculus 2005   [facility]   Past Surgical History:  Procedure Laterality Date  . ABLATION  06/2013   atypical atrial flutter ablation by Dr Lehman Prom at [facility] (mitral annular flutter ablated)  . afib 09/22/2008--11/03/2009     afib ablation x 2 by Dr Rayann Heman with subsequent convergent procedure at [facility]  . CARDIAC CATHETERIZATION  2005   non obstructive CAD  . CARDIOVERSION    . CARDIOVERSION  12/09/2010   Procedure: CARDIOVERSION;  Surgeon: Loralie Champagne, MD;  Location: [place];  Service: Cardiovascular;  Laterality: N/A;  . CARDIOVERSION  09/15/2011   Procedure: CARDIOVERSION;  Surgeon: Josue Hector, MD;  Location: [place];  Service:  Cardiovascular;  Laterality: N/A;  office drawing labs wed 8/28  . CARDIOVERSION N/A 07/18/2012   Procedure: CARDIOVERSION;  Surgeon: Thayer Headings, MD;  Location: [place];  Service: Cardiovascular;  Laterality: N/A;  . CARDIOVERSION  08/2014   [place]  . COLONOSCOPY  2009   Dr Sharlett Iles  . convergent procedure  2013   at [facility] for a fib  . JOINT REPLACEMENT Right 2008, 2012  . LOOP RECORDER IMPLANT Left 12-2010  . TONSILLECTOMY    . TOTAL HIP ARTHROPLASTY  06/2010   Depuy prosthesis replacement, Dr Alvan Dame  . TOTAL HIP ARTHROPLASTY Right 2008    Depuy;Dr Alvan Dame    Current Outpatient Medications  Medication Sig Dispense Refill  . amLODipine (NORVASC) 10 MG tablet Take 1 tablet (10 mg total) by mouth daily. 90 tablet 1  . atorvastatin (LIPITOR) 10 MG tablet Take 1 tablet (10 mg total) by mouth daily. 90 tablet 1  . glimepiride (AMARYL) 4 MG tablet Take 1 tablet (4 mg total) by mouth daily before breakfast. 90 tablet 1  . hydrochlorothiazide (HYDRODIURIL) 25 MG tablet Take 1 tablet (25 mg total) by mouth daily. 30 tablet 3  . latanoprost (XALATAN) 0.005 % ophthalmic solution Place 1 drop into the left eye at bedtime.    Marland Kitchen levothyroxine (SYNTHROID, LEVOTHROID) 75 MCG tablet Take 1 tablet (75 mcg total) by mouth daily. 90 tablet 1  . Magnesium Oxide  500 MG CAPS Take 1 capsule by mouth 2 (two) times daily.    . metFORMIN (GLUCOPHAGE-XR) 500 MG 24 hr tablet TAKE 2 TABLETS (1,000MG )   TWO TIMES A DAY WITH MEALS 360 tablet 1  . metoprolol succinate (TOPROL-XL) 100 MG 24 hr tablet Take 1 tablet twice daily and an extra 1/2 tablet daily as needed for atrial fibrillation. Please keep upcoming appt. Thank you 225 tablet 0  . metoprolol tartrate (LOPRESSOR) 50 MG tablet Take 1 tablet every 4 hours as needed (Fast Heart Rate). Please keep upcoming appt for future refills. Thank you 540 tablet 0  . repaglinide (PRANDIN) 2 MG tablet Take 2 tablets (4 mg total) by mouth 3 (three) times daily before  meals. Take 15-30 min prior to meal 540 tablet 3  . rivaroxaban (XARELTO) 20 MG TABS tablet Take 1 tablet (20 mg total) by mouth daily with supper. 90 tablet 3   No current facility-administered medications for this encounter.     No Known Allergies  Social History   Socioeconomic History  . Marital status: Married    Spouse name: [name]  . Number of children: Y  . Years of education: Not on file  . Highest education level: Not on file  Occupational History  . 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  Social Needs  . Financial resource strain: Not hard at all  . Food insecurity:    Worry: Never true    Inability: Never true  . Transportation needs:    Medical: No    Non-medical: No  Tobacco Use  . Smoking status: Former Smoker    Packs/day: 0.90    Years: 8.00    Pack years: 7.20    Types: Cigarettes    Last attempt to quit: 01/16/1974    Years since quitting: 43.7  . Smokeless tobacco: Never Used  . Tobacco comment: smoked  1966- 1976,up to 1 ppd  Substance and Sexual Activity  . Alcohol use: Yes    Alcohol/week: 0.0 standard drinks    Comment: Socially; "sips socially"( essentially none)  . Drug use: No  . Sexual activity: Yes  Lifestyle  . Physical activity:    Days per week: 4 days    Minutes per session: 50 min  . Stress: Only a little  Relationships  . Social connections:    Talks on phone: More than three times a week    Gets together: More than three times a week    Attends religious service: Not on file    Active member of club or organization: Not on file    Attends meetings of clubs or organizations: Not on file    Relationship status: Married  . Intimate partner violence:    Fear of current or ex partner: No    Emotionally abused: No    Physically abused: No    Forced sexual activity: No  Other Topics Concern  . Not on file  Social History Narrative  . Not on file    Family History  Problem Relation Age of Onset  . Diabetes  Father   . Stroke Father        mid 59s  . Lung cancer Mother        non smoker  . Stroke Mother 18  . Testicular cancer Brother   . Allergies Maternal Grandmother   . Cancer Maternal Grandmother        unsure  . Allergies Brother   . Heart attack  Neg Hx     ROS- All systems are reviewed and negative except as per the HPI above  Physical Exam: Vitals:   10/02/17 1503  BP: (!) 152/82  Pulse: 70  Weight: 85.3 kg  Height: 5\' 8"  (1.727 m)   Wt Readings from Last 3 Encounters:  10/02/17 85.3 kg  09/19/17 86.2 kg  09/03/17 84.8 kg    Labs: Lab Results  Component Value Date   NA 136 10/01/2017   K 4.3 10/01/2017   CL 98 10/01/2017   CO2 27 10/01/2017   GLUCOSE 202 (H) 10/01/2017   BUN 19 10/01/2017   CREATININE 1.02 10/01/2017   CALCIUM 10.4 10/01/2017   MG 2.0 04/23/2015   Lab Results  Component Value Date   INR 2.6 09/26/2010   Lab Results  Component Value Date   CHOL 113 09/19/2017   HDL 45.90 09/19/2017   LDLCALC 45 09/19/2017   TRIG 109.0 09/19/2017     GEN- The patient is well appearing, alert and oriented x 3 today.   Head- normocephalic, atraumatic Eyes-  Sclera clear, conjunctiva pink Ears- hearing intact Oropharynx- clear Neck- supple, no JVP Lymph- no cervical lymphadenopathy Lungs- Clear to ausculation bilaterally, normal work of breathing Heart- Regular rate and rhythm, no murmurs, rubs or gallops, PMI not laterally displaced GI- soft, NT, ND, + BS Extremities- no clubbing, cyanosis, or edema MS- no significant deformity or atrophy Skin- no rash or lesion Psych- euthymic mood, full affect Neuro- strength and sensation are intact  EKG- NSR at 70 bpm, pr int 176 ms, qrs int 116 ms, qtc 440 ms Epic records reviewed    Assessment and Plan: 1. H/o afib, recent palpitations May have been brought on by hyperkalemia and fluctuations  in BP   Now resolved  Continue with usual daily  metoprolol succinate  dose and has metoprolol tartrate   as needed  Continue xarelto 20 mg daily  2. HTN Per PCP  Improving with addition of HCTZ started 2 days ago  Hyperkalemia has resolved off ACE  F/u with Dr. Rayann Heman as per recall    [name]. Aram Domzalski, [facility] [address] [place], [place] [address] [phone]

## 2017-10-23 DIAGNOSIS — N401 Enlarged prostate with lower urinary tract symptoms: Secondary | ICD-10-CM | POA: Diagnosis not present

## 2017-11-27 ENCOUNTER — Ambulatory Visit: Payer: Medicare Other | Admitting: Internal Medicine

## 2017-12-07 ENCOUNTER — Other Ambulatory Visit: Payer: Self-pay | Admitting: Internal Medicine

## 2017-12-11 DIAGNOSIS — R351 Nocturia: Secondary | ICD-10-CM | POA: Diagnosis not present

## 2017-12-11 DIAGNOSIS — N401 Enlarged prostate with lower urinary tract symptoms: Secondary | ICD-10-CM | POA: Diagnosis not present

## 2017-12-11 DIAGNOSIS — H4052X1 Glaucoma secondary to other eye disorders, left eye, mild stage: Secondary | ICD-10-CM | POA: Diagnosis not present

## 2017-12-27 ENCOUNTER — Encounter (INDEPENDENT_AMBULATORY_CARE_PROVIDER_SITE_OTHER): Payer: Medicare Other | Admitting: Ophthalmology

## 2017-12-31 ENCOUNTER — Encounter: Payer: Self-pay | Admitting: Family

## 2017-12-31 ENCOUNTER — Ambulatory Visit (INDEPENDENT_AMBULATORY_CARE_PROVIDER_SITE_OTHER): Payer: Medicare Other | Admitting: Family

## 2017-12-31 VITALS — BP 148/74 | HR 68 | Temp 98.0°F | Wt 196.0 lb

## 2017-12-31 DIAGNOSIS — E1151 Type 2 diabetes mellitus with diabetic peripheral angiopathy without gangrene: Secondary | ICD-10-CM | POA: Diagnosis not present

## 2017-12-31 DIAGNOSIS — J019 Acute sinusitis, unspecified: Secondary | ICD-10-CM | POA: Diagnosis not present

## 2017-12-31 MED ORDER — FLUTICASONE PROPIONATE 50 MCG/ACT NA SUSP: 2.0000 | Freq: Every day | NASAL | 6 refills | Status: DC

## 2017-12-31 MED ORDER — AMOXICILLIN-POT CLAVULANATE 875-125 MG PO TABS: 1.0000 | ORAL_TABLET | Freq: Two times a day (BID) | ORAL | 0 refills | Status: DC

## 2017-12-31 NOTE — Progress Notes (Signed)
[name] is a 72 y.o. male with the following history as recorded in EpicCare:  Patient Active Problem List   Diagnosis Date Noted  . Diabetes mellitus with peripheral circulatory disorder, controlled ([place]) 11/09/2016  . Lumbar radiculopathy 05/05/2015  . Atypical atrial flutter ([place]) 09/12/2014  . Obesity 06/25/2013  . Hypothyroidism 12/30/2010  . OSA (obstructive sleep apnea) 09/02/2010  . Atrial fibrillation ([place]) 04/05/2009  . ERECTILE DYSFUNCTION 05/01/2008  . Hyperlipidemia 09/06/2007  . Hypertension 09/06/2007  . BPH with obstruction/lower urinary tract symptoms 04/25/2007    Current Outpatient Medications  Medication Sig Dispense Refill  . amLODipine (NORVASC) 10 MG tablet Take 1 tablet (10 mg total) by mouth daily. 90 tablet 1  . atorvastatin (LIPITOR) 10 MG tablet Take 1 tablet (10 mg total) by mouth daily. 90 tablet 1  . glimepiride (AMARYL) 4 MG tablet TAKE 1 TABLET DAILY BEFORE BREAKFAST 90 tablet 1  . latanoprost (XALATAN) 0.005 % ophthalmic solution Place 1 drop into the left eye at bedtime.    Marland Kitchen levothyroxine (SYNTHROID, LEVOTHROID) 75 MCG tablet Take 1 tablet (75 mcg total) by mouth daily. 90 tablet 1  . Magnesium Oxide 500 MG CAPS Take 1 capsule by mouth 2 (two) times daily.    . metFORMIN (GLUCOPHAGE-XR) 500 MG 24 hr tablet TAKE 2 TABLETS (1,000MG )   TWO TIMES A DAY WITH MEALS 360 tablet 1  . metoprolol succinate (TOPROL-XL) 100 MG 24 hr tablet Take 1 tablet twice daily and an extra 1/2 tablet daily as needed for atrial fibrillation. Please keep upcoming appt. Thank you 225 tablet 0  . metoprolol tartrate (LOPRESSOR) 50 MG tablet Take 1 tablet every 4 hours as needed (Fast Heart Rate). Please keep upcoming appt for future refills. Thank you 540 tablet 0  . repaglinide (PRANDIN) 2 MG tablet Take 2 tablets (4 mg total) by mouth 3 (three) times daily before meals. Take 15-30 min prior to meal 540 tablet 3  . rivaroxaban (XARELTO) 20 MG TABS tablet Take 1 tablet  (20 mg total) by mouth daily with supper. 90 tablet 3  . amoxicillin-clavulanate (AUGMENTIN) 875-125 MG tablet Take 1 tablet by mouth 2 (two) times daily. 20 tablet 0  . fluticasone (FLONASE) 50 MCG/ACT nasal spray Place 2 sprays into both nostrils daily. 16 g 6   No current facility-administered medications for this visit.     Allergies: Patient has no known allergies.  Past Medical History:  Diagnosis Date  . AF (atrial fibrillation) (HCC)    persistent afib, s/p afib ablation x 2 with subsequent convergent procedure at [facility]  . Allergic rhinitis    seasonal  . Atrial flutter ([place])   . CTS (carpal tunnel syndrome)    PMH of  . Diabetes mellitus    type 2  . External hemorrhoids   . Glaucoma   . Hyperlipidemia   . Hypertension   . Hypothyroidism   . Renal calculus 2005   [facility]    Past Surgical History:  Procedure Laterality Date  . ABLATION  06/2013   atypical atrial flutter ablation by Dr Lehman Prom at [facility] (mitral annular flutter ablated)  . afib 09/22/2008--11/03/2009     afib ablation x 2 by Dr Rayann Heman with subsequent convergent procedure at [facility]  . CARDIAC CATHETERIZATION  2005   non obstructive CAD  . CARDIOVERSION    . CARDIOVERSION  12/09/2010   Procedure: CARDIOVERSION;  Surgeon: Loralie Champagne, MD;  Location: [place];  Service: Cardiovascular;  Laterality: N/A;  . CARDIOVERSION  09/15/2011   Procedure: CARDIOVERSION;  Surgeon: Josue Hector, MD;  Location: [place];  Service: Cardiovascular;  Laterality: N/A;  office drawing labs wed 8/28  . CARDIOVERSION N/A 07/18/2012   Procedure: CARDIOVERSION;  Surgeon: Thayer Headings, MD;  Location: [place];  Service: Cardiovascular;  Laterality: N/A;  . CARDIOVERSION  08/2014   [place]  . COLONOSCOPY  2009   Dr Sharlett Iles  . convergent procedure  2013   at [facility] for a fib  . JOINT REPLACEMENT Right 2008, 2012  . LOOP RECORDER IMPLANT Left 12-2010  . TONSILLECTOMY    . TOTAL HIP ARTHROPLASTY  06/2010   Depuy  prosthesis replacement, Dr Alvan Dame  . TOTAL HIP ARTHROPLASTY Right 2008    Depuy;Dr Alvan Dame    Family History  Problem Relation Age of Onset  . Diabetes Father   . Stroke Father        mid 39s  . Lung cancer Mother        non smoker  . Stroke Mother 22  . Testicular cancer Brother   . Allergies Maternal Grandmother   . Cancer Maternal Grandmother        unsure  . Allergies Brother   . Heart attack Neg Hx     Social History   Tobacco Use  . Smoking status: Former Smoker    Packs/day: 0.90    Years: 8.00    Pack years: 7.20    Types: Cigarettes    Last attempt to quit: 01/16/1974    Years since quitting: 43.9  . Smokeless tobacco: Never Used  . Tobacco comment: smoked  1966- 1976,up to 1 ppd  Substance Use Topics  . Alcohol use: Yes    Alcohol/week: 0.0 standard drinks    Comment: Socially; "sips socially"( essentially none)    Subjective:  Right ear pain x 5-6 days ago; started with clogged sensation in right ear/ has progressed into right sided sinus pain/ pressure; + sore throat; feels majority of symptoms are localized over right cheek; up to date on dental care;     Objective:  Vitals:   12/31/17 1504  BP: (!) 148/74  Pulse: 68  Temp: 98 F (36.7 C)  TempSrc: Oral  Weight: 196 lb (88.9 kg)    General: Well developed, well nourished, in no acute distress  Skin : Warm and dry.  Head: Normocephalic and atraumatic  Eyes: Sclera and conjunctiva clear; pupils round and reactive to light; extraocular movements intact  Ears: External normal; canals clear; tympanic membranes normal  Oropharynx: Pink, supple. No suspicious lesions  Neck: Supple without thyromegaly, adenopathy  Lungs: Respirations unlabored; clear to auscultation bilaterally without wheeze, rales, rhonchi  CVS exam: normal rate and regular rhythm.  Neurologic: Alert and oriented; speech intact; face symmetrical; moves all extremities well; CNII-XII intact without focal deficit   Assessment:  1. Acute  sinusitis, recurrence not specified, unspecified location   2. Diabetes mellitus with peripheral circulatory disorder, controlled ([place])     Plan:  1. Rx for Augmentin 875 mg bid x 10 days, Flonase; follow-up with his dentist if symptoms persist; 2. Encouraged weight loss/ exercise; he is due to see his PCP in March- follow-up sooner if FBG continue to average in the 200s.  No follow-ups on file.  No orders of the defined types were placed in this encounter.   Requested Prescriptions   Signed Prescriptions Disp Refills  . amoxicillin-clavulanate (AUGMENTIN) 875-125 MG tablet 20 tablet 0    Sig: Take 1 tablet by  mouth 2 (two) times daily.  . fluticasone (FLONASE) 50 MCG/ACT nasal spray 16 g 6    Sig: Place 2 sprays into both nostrils daily.

## 2017-12-31 NOTE — Addendum Note (Signed)
Addended by: Sherlene Shams on: 12/31/2017 04:42 PM   Modules accepted: Level of Service

## 2018-01-01 DIAGNOSIS — H4053X1 Glaucoma secondary to other eye disorders, bilateral, mild stage: Secondary | ICD-10-CM | POA: Diagnosis not present

## 2018-01-01 DIAGNOSIS — Z961 Presence of intraocular lens: Secondary | ICD-10-CM | POA: Diagnosis not present

## 2018-01-01 DIAGNOSIS — Z9849 Cataract extraction status, unspecified eye: Secondary | ICD-10-CM | POA: Diagnosis not present

## 2018-01-03 ENCOUNTER — Encounter (INDEPENDENT_AMBULATORY_CARE_PROVIDER_SITE_OTHER): Payer: Medicare Other | Admitting: Ophthalmology

## 2018-01-03 DIAGNOSIS — I10 Essential (primary) hypertension: Secondary | ICD-10-CM | POA: Diagnosis not present

## 2018-01-03 DIAGNOSIS — E113293 Type 2 diabetes mellitus with mild nonproliferative diabetic retinopathy without macular edema, bilateral: Secondary | ICD-10-CM | POA: Diagnosis not present

## 2018-01-03 DIAGNOSIS — H43813 Vitreous degeneration, bilateral: Secondary | ICD-10-CM | POA: Diagnosis not present

## 2018-01-03 DIAGNOSIS — H35033 Hypertensive retinopathy, bilateral: Secondary | ICD-10-CM

## 2018-01-03 DIAGNOSIS — E11319 Type 2 diabetes mellitus with unspecified diabetic retinopathy without macular edema: Secondary | ICD-10-CM

## 2018-01-06 ENCOUNTER — Encounter: Payer: Self-pay | Admitting: Internal Medicine

## 2018-01-07 MED ORDER — LEVOTHYROXINE SODIUM 75 MCG PO TABS: 75.0000 ug | ORAL_TABLET | Freq: Every day | ORAL | 0 refills | Status: DC

## 2018-01-07 MED ORDER — METFORMIN HCL ER 500 MG PO TB24: ORAL_TABLET | ORAL | 0 refills | Status: DC

## 2018-01-07 MED ORDER — AMLODIPINE BESYLATE 10 MG PO TABS: 10.0000 mg | ORAL_TABLET | Freq: Every day | ORAL | 0 refills | Status: DC

## 2018-01-18 DIAGNOSIS — M62838 Other muscle spasm: Secondary | ICD-10-CM | POA: Diagnosis not present

## 2018-01-22 ENCOUNTER — Encounter: Payer: Self-pay | Admitting: Internal Medicine

## 2018-01-23 ENCOUNTER — Encounter: Payer: Self-pay | Admitting: Internal Medicine

## 2018-01-24 ENCOUNTER — Other Ambulatory Visit: Payer: Self-pay

## 2018-01-24 MED ORDER — AMLODIPINE BESYLATE 10 MG PO TABS: 10.0000 mg | ORAL_TABLET | Freq: Every day | ORAL | 0 refills | Status: DC

## 2018-01-24 MED ORDER — ATORVASTATIN CALCIUM 10 MG PO TABS: 10.0000 mg | ORAL_TABLET | Freq: Every day | ORAL | 0 refills | Status: DC

## 2018-01-24 MED ORDER — METFORMIN HCL ER 500 MG PO TB24: ORAL_TABLET | ORAL | 0 refills | Status: DC

## 2018-01-24 MED ORDER — LEVOTHYROXINE SODIUM 75 MCG PO TABS: 75.0000 ug | ORAL_TABLET | Freq: Every day | ORAL | 0 refills | Status: DC

## 2018-01-24 MED ORDER — RIVAROXABAN 20 MG PO TABS: 20.0000 mg | ORAL_TABLET | Freq: Every day | ORAL | 1 refills | Status: DC

## 2018-01-24 MED ORDER — GLIMEPIRIDE 4 MG PO TABS: 4.0000 mg | ORAL_TABLET | Freq: Every day | ORAL | 0 refills | Status: DC

## 2018-01-31 DIAGNOSIS — H4052X1 Glaucoma secondary to other eye disorders, left eye, mild stage: Secondary | ICD-10-CM | POA: Diagnosis not present

## 2018-01-31 DIAGNOSIS — H401111 Primary open-angle glaucoma, right eye, mild stage: Secondary | ICD-10-CM | POA: Diagnosis not present

## 2018-01-31 NOTE — Patient Instructions (Addendum)
  Tests ordered today. Your results will be released to MyChart (or called to you) after review, usually within 72hours after test completion. If any changes need to be made, you will be notified at that same time.  Medications reviewed and updated.  Changes include :   none      Please followup in 6 months   

## 2018-01-31 NOTE — Progress Notes (Signed)
Subjective:    Patient ID: [name], male    DOB: [birth year], 73 y.o.   MRN: [id number]  HPI The patient is here for follow up.  Diabetes: He is taking his medication daily as prescribed. He is fairly compliant with a diabetic diet. He is exercising minimally . He monitors his sugars and they have been running 150-170. He checks his feet daily and denies foot lesions. He is up-to-date with an ophthalmology examination.   Afib, Hypertension: He is taking his medication daily - he thinks he is taking quinapril but we had stopped that due to hyperkalemia.  He will check. He is compliant with a low sodium diet.  He denies chest pain, palpitations, edema, shortness of breath and regular headaches. He is exercising minimally.       Hyperlipidemia: He is taking his medication daily. He is compliant with a low fat/cholesterol diet. He is exercising minimally. He denies myalgias.   Hypothyroidism:  He is taking his medication daily.  He denies any recent changes in energy or weight that are unexplained.    Medications and allergies reviewed with patient and updated if appropriate.  Patient Active Problem List   Diagnosis Date Noted  . Diabetes mellitus with peripheral circulatory disorder, controlled ([place]) 11/09/2016  . Lumbar radiculopathy 05/05/2015  . Atypical atrial flutter ([place]) 09/12/2014  . Obesity 06/25/2013  . Hypothyroidism 12/30/2010  . OSA (obstructive sleep apnea) 09/02/2010  . Atrial fibrillation ([place]) 04/05/2009  . ERECTILE DYSFUNCTION 05/01/2008  . Hyperlipidemia 09/06/2007  . Hypertension 09/06/2007  . BPH with obstruction/lower urinary tract symptoms 04/25/2007    Current Outpatient Medications on File Prior to Visit  Medication Sig Dispense Refill  . amLODipine (NORVASC) 10 MG tablet Take 1 tablet (10 mg total) by mouth daily. Must keep scheduled appt in March for future refills 90 tablet 0  . atorvastatin (LIPITOR) 10 MG tablet Take 1 tablet (10 mg total)  by mouth daily. 90 tablet 0  . fluticasone (FLONASE) 50 MCG/ACT nasal spray Place 2 sprays into both nostrils daily. 16 g 6  . glimepiride (AMARYL) 4 MG tablet Take 1 tablet (4 mg total) by mouth daily before breakfast. 90 tablet 0  . latanoprost (XALATAN) 0.005 % ophthalmic solution Place 1 drop into the left eye at bedtime.    Marland Kitchen levothyroxine (SYNTHROID, LEVOTHROID) 75 MCG tablet Take 1 tablet (75 mcg total) by mouth daily. Must keep scheduled appt in March for future refills 90 tablet 0  . Magnesium Oxide 500 MG CAPS Take 1 capsule by mouth 2 (two) times daily.    . metFORMIN (GLUCOPHAGE-XR) 500 MG 24 hr tablet TAKE 2 TABLETS (1,000MG )   TWO TIMES A DAY WITH MEALS 360 tablet 0  . metoprolol succinate (TOPROL-XL) 100 MG 24 hr tablet Take 1 tablet twice daily and an extra 1/2 tablet daily as needed for atrial fibrillation. Please keep upcoming appt. Thank you 225 tablet 0  . repaglinide (PRANDIN) 2 MG tablet Take 2 tablets (4 mg total) by mouth 3 (three) times daily before meals. Take 15-30 min prior to meal 540 tablet 3  . rivaroxaban (XARELTO) 20 MG TABS tablet Take 1 tablet (20 mg total) by mouth daily with supper. 90 tablet 1   No current facility-administered medications on file prior to visit.     Past Medical History:  Diagnosis Date  . AF (atrial fibrillation) (HCC)    persistent afib, s/p afib ablation x 2 with subsequent convergent procedure at [facility]  .  Allergic rhinitis    seasonal  . Atrial flutter ([place])   . CTS (carpal tunnel syndrome)    PMH of  . Diabetes mellitus    type 2  . External hemorrhoids   . Glaucoma   . Hyperlipidemia   . Hypertension   . Hypothyroidism   . Renal calculus 2005   [facility]    Past Surgical History:  Procedure Laterality Date  . ABLATION  06/2013   atypical atrial flutter ablation by Dr Lehman Prom at [facility] (mitral annular flutter ablated)  . afib 09/22/2008--11/03/2009     afib ablation x 2 by Dr Rayann Heman with subsequent convergent procedure  at [facility]  . CARDIAC CATHETERIZATION  2005   non obstructive CAD  . CARDIOVERSION    . CARDIOVERSION  12/09/2010   Procedure: CARDIOVERSION;  Surgeon: Loralie Champagne, MD;  Location: [place];  Service: Cardiovascular;  Laterality: N/A;  . CARDIOVERSION  09/15/2011   Procedure: CARDIOVERSION;  Surgeon: Josue Hector, MD;  Location: [place];  Service: Cardiovascular;  Laterality: N/A;  office drawing labs wed 8/28  . CARDIOVERSION N/A 07/18/2012   Procedure: CARDIOVERSION;  Surgeon: Thayer Headings, MD;  Location: [place];  Service: Cardiovascular;  Laterality: N/A;  . CARDIOVERSION  08/2014   [place]  . COLONOSCOPY  2009   Dr Sharlett Iles  . convergent procedure  2013   at [facility] for a fib  . JOINT REPLACEMENT Right 2008, 2012  . LOOP RECORDER IMPLANT Left 12-2010  . TONSILLECTOMY    . TOTAL HIP ARTHROPLASTY  06/2010   Depuy prosthesis replacement, Dr Alvan Dame  . TOTAL HIP ARTHROPLASTY Right 2008    Depuy;Dr Alvan Dame    Social History   Socioeconomic History  . Marital status: Married    Spouse name: Izora Gala  . Number of children: Y  . Years of education: Not on file  . Highest education level: Not on file  Occupational History  . 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  Social Needs  . Financial resource strain: Not hard at all  . Food insecurity:    Worry: Never true    Inability: Never true  . Transportation needs:    Medical: No    Non-medical: No  Tobacco Use  . Smoking status: Former Smoker    Packs/day: 0.90    Years: 8.00    Pack years: 7.20    Types: Cigarettes    Last attempt to quit: 01/16/1974    Years since quitting: 44.0  . Smokeless tobacco: Never Used  . Tobacco comment: smoked  1966- 1976,up to 1 ppd  Substance and Sexual Activity  . Alcohol use: Yes    Alcohol/week: 0.0 standard drinks    Comment: Socially; "sips socially"( essentially none)  . Drug use: No  . Sexual activity: Yes  Lifestyle  . Physical activity:    Days per week: 4  days    Minutes per session: 50 min  . Stress: Only a little  Relationships  . Social connections:    Talks on phone: More than three times a week    Gets together: More than three times a week    Attends religious service: Not on file    Active member of club or organization: Not on file    Attends meetings of clubs or organizations: Not on file    Relationship status: Married  Other Topics Concern  . Not on file  Social History Narrative  . Not on file  Family History  Problem Relation Age of Onset  . Diabetes Father   . Stroke Father        mid 44s  . Lung cancer Mother        non smoker  . Stroke Mother 2  . Testicular cancer Brother   . Allergies Maternal Grandmother   . Cancer Maternal Grandmother        unsure  . Allergies Brother   . Heart attack Neg Hx     Review of Systems  Constitutional: Negative for chills and fever.  Respiratory: Negative for cough, shortness of breath and wheezing.   Cardiovascular: Negative for chest pain, palpitations and leg swelling.  Neurological: Negative for light-headedness, numbness and headaches.       Objective:   Vitals:   02/01/18 0741  BP: (!) 142/68  Pulse: 65  Resp: 16  Temp: 98.4 F (36.9 C)  SpO2: 99%   BP Readings from Last 3 Encounters:  02/01/18 (!) 142/68  12/31/17 (!) 148/74  10/02/17 (!) 152/82   Wt Readings from Last 3 Encounters:  02/01/18 189 lb (85.7 kg)  12/31/17 196 lb (88.9 kg)  10/02/17 188 lb (85.3 kg)   Body mass index is 28.74 kg/m.   Physical Exam    Constitutional: Appears well-developed and well-nourished. No distress.  HENT:  Head: Normocephalic and atraumatic.  Neck: Neck supple. No tracheal deviation present. No thyromegaly present.  No cervical lymphadenopathy Cardiovascular: Normal rate, regular rhythm and normal heart sounds.   No murmur heard. No carotid bruit .  No edema Pulmonary/Chest: Effort normal and breath sounds normal. No respiratory distress. No has no  wheezes. No rales.  Skin: Skin is warm and dry. Not diaphoretic.  Psychiatric: Normal mood and affect. Behavior is normal.      Assessment & Plan:    See Problem List for Assessment and Plan of chronic medical problems.

## 2018-02-01 ENCOUNTER — Ambulatory Visit (INDEPENDENT_AMBULATORY_CARE_PROVIDER_SITE_OTHER): Payer: Medicare Other | Admitting: Internal Medicine

## 2018-02-01 ENCOUNTER — Other Ambulatory Visit (INDEPENDENT_AMBULATORY_CARE_PROVIDER_SITE_OTHER): Payer: Medicare Other

## 2018-02-01 ENCOUNTER — Encounter: Payer: Self-pay | Admitting: Internal Medicine

## 2018-02-01 VITALS — BP 142/68 | HR 65 | Temp 98.4°F | Resp 16 | Ht 68.0 in | Wt 189.0 lb

## 2018-02-01 DIAGNOSIS — E7849 Other hyperlipidemia: Secondary | ICD-10-CM

## 2018-02-01 DIAGNOSIS — E039 Hypothyroidism, unspecified: Secondary | ICD-10-CM

## 2018-02-01 DIAGNOSIS — I10 Essential (primary) hypertension: Secondary | ICD-10-CM | POA: Diagnosis not present

## 2018-02-01 DIAGNOSIS — E1151 Type 2 diabetes mellitus with diabetic peripheral angiopathy without gangrene: Secondary | ICD-10-CM

## 2018-02-01 DIAGNOSIS — I4891 Unspecified atrial fibrillation: Secondary | ICD-10-CM | POA: Diagnosis not present

## 2018-02-01 LAB — LIPID PANEL
Cholesterol: 120 mg/dL (ref 0–200)
HDL: 48.8 mg/dL (ref >39.00)
LDL Cholesterol: 51 mg/dL (ref 0–99)
NonHDL: 71.32
Total CHOL/HDL Ratio: 2
Triglycerides: 103 mg/dL (ref 0.0–149.0)
VLDL: 20.6 mg/dL (ref 0.0–40.0)

## 2018-02-01 LAB — COMPREHENSIVE METABOLIC PANEL
ALT: 20 U/L (ref 0–53)
AST: 15 U/L (ref 0–37)
Albumin: 4.7 g/dL (ref 3.5–5.2)
Alkaline Phosphatase: 44 U/L (ref 39–117)
BUN: 22 mg/dL (ref 6–23)
CO2: 26 mEq/L (ref 19–32)
Calcium: 10.9 mg/dL — ABNORMAL HIGH (ref 8.4–10.5)
Chloride: 103 mEq/L (ref 96–112)
Creatinine, Ser: 1.06 mg/dL (ref 0.40–1.50)
GFR: 68.81 mL/min (ref >60.00)
Glucose, Bld: 186 mg/dL — ABNORMAL HIGH (ref 70–99)
Potassium: 5.7 mEq/L — ABNORMAL HIGH (ref 3.5–5.1)
Sodium: 140 mEq/L (ref 135–145)
Total Bilirubin: 0.5 mg/dL (ref 0.2–1.2)
Total Protein: 7.8 g/dL (ref 6.0–8.3)

## 2018-02-01 LAB — MICROALBUMIN / CREATININE URINE RATIO
Creatinine,U: 166.9 mg/dL
Microalb Creat Ratio: 23 mg/g (ref 0.0–30.0)
Microalb, Ur: 38.4 mg/dL — ABNORMAL HIGH (ref 0.0–1.9)

## 2018-02-01 LAB — CBC WITH DIFFERENTIAL/PLATELET
Basophils Absolute: 0.1 10*3/uL (ref 0.0–0.1)
Basophils Relative: 1.2 % (ref 0.0–3.0)
Eosinophils Absolute: 0.5 10*3/uL (ref 0.0–0.7)
Eosinophils Relative: 6.2 % — ABNORMAL HIGH (ref 0.0–5.0)
HCT: 46.9 % (ref 39.0–52.0)
Hemoglobin: 15.8 g/dL (ref 13.0–17.0)
Lymphocytes Relative: 24.4 % (ref 12.0–46.0)
Lymphs Abs: 1.9 10*3/uL (ref 0.7–4.0)
MCHC: 33.7 g/dL (ref 30.0–36.0)
MCV: 87.7 fl (ref 78.0–100.0)
Monocytes Absolute: 0.9 10*3/uL (ref 0.1–1.0)
Monocytes Relative: 12 % (ref 3.0–12.0)
Neutro Abs: 4.4 10*3/uL (ref 1.4–7.7)
Neutrophils Relative %: 56.2 % (ref 43.0–77.0)
Platelets: 206 10*3/uL (ref 150.0–400.0)
RBC: 5.35 Mil/uL (ref 4.22–5.81)
RDW: 14.5 % (ref 11.5–15.5)
WBC: 7.9 10*3/uL (ref 4.0–10.5)

## 2018-02-01 LAB — HEMOGLOBIN A1C: Hgb A1c MFr Bld: 8.3 % — ABNORMAL HIGH (ref 4.6–6.5)

## 2018-02-01 LAB — TSH: TSH: 3.42 u[IU]/mL (ref 0.35–4.50)

## 2018-02-01 NOTE — Assessment & Plan Note (Signed)
Check a1c Low sugar / carb diet Stressed regular exercise and weight loss Will consider adding farxiga if needed

## 2018-02-01 NOTE — Assessment & Plan Note (Signed)
Clinically euthyroid Check tsh  Titrate med dose if needed  

## 2018-02-01 NOTE — Assessment & Plan Note (Signed)
BP controlled Not sure if he is taking quinapril or not - he will let us know today Current regimen effective and well tolerated Continue current medications at current doses cmp

## 2018-02-01 NOTE — Assessment & Plan Note (Signed)
Check lipid panel  Continue daily statin Regular exercise and healthy diet encouraged  

## 2018-02-01 NOTE — Assessment & Plan Note (Signed)
Asymptomatic  Rate controlled On xarelto Cbc, cmp

## 2018-02-02 ENCOUNTER — Encounter: Payer: Self-pay | Admitting: Internal Medicine

## 2018-02-04 ENCOUNTER — Telehealth: Payer: Self-pay | Admitting: Internal Medicine

## 2018-02-04 MED ORDER — DAPAGLIFLOZIN PROPANEDIOL 5 MG PO TABS: 5.0000 mg | ORAL_TABLET | Freq: Every day | ORAL | 5 refills | Status: DC

## 2018-02-04 MED ORDER — REPAGLINIDE 2 MG PO TABS: 4.0000 mg | ORAL_TABLET | Freq: Three times a day (TID) | ORAL | 1 refills | Status: DC

## 2018-02-04 NOTE — Telephone Encounter (Signed)
Copied from [place] [id number]. Topic: Quick Communication - Rx Refill/Question >> Feb 04, 2018 11:08 AM Rayann Heman wrote: Medication:farxiga need Rx today if possible. Please see mychart message from 02/02/18. Please advise   [id number] [address], [place] [phone] (Phone) [phone] (Fax

## 2018-02-04 NOTE — Telephone Encounter (Signed)
Sent - should take all of his other meds for diabetes including the repalinide

## 2018-02-04 NOTE — Telephone Encounter (Signed)
Pt is aware.  

## 2018-03-20 ENCOUNTER — Ambulatory Visit: Payer: Medicare Other | Admitting: Internal Medicine

## 2018-03-28 ENCOUNTER — Encounter: Payer: Self-pay | Admitting: Internal Medicine

## 2018-04-01 ENCOUNTER — Other Ambulatory Visit: Payer: Self-pay

## 2018-04-01 MED ORDER — ATORVASTATIN CALCIUM 10 MG PO TABS: 10.0000 mg | ORAL_TABLET | Freq: Every day | ORAL | 1 refills | Status: DC

## 2018-04-03 ENCOUNTER — Other Ambulatory Visit: Payer: Self-pay | Admitting: Internal Medicine

## 2018-04-04 ENCOUNTER — Other Ambulatory Visit: Payer: Self-pay | Admitting: Internal Medicine

## 2018-04-08 ENCOUNTER — Ambulatory Visit: Payer: Medicare Other | Admitting: Internal Medicine

## 2018-04-18 ENCOUNTER — Encounter: Payer: Self-pay | Admitting: Internal Medicine

## 2018-04-24 ENCOUNTER — Ambulatory Visit (INDEPENDENT_AMBULATORY_CARE_PROVIDER_SITE_OTHER): Payer: Medicare Other | Admitting: Internal Medicine

## 2018-04-24 ENCOUNTER — Encounter: Payer: Self-pay | Admitting: Internal Medicine

## 2018-04-24 DIAGNOSIS — T383X5A Adverse effect of insulin and oral hypoglycemic [antidiabetic] drugs, initial encounter: Secondary | ICD-10-CM

## 2018-04-24 DIAGNOSIS — R682 Dry mouth, unspecified: Secondary | ICD-10-CM

## 2018-04-24 DIAGNOSIS — E1151 Type 2 diabetes mellitus with diabetic peripheral angiopathy without gangrene: Secondary | ICD-10-CM

## 2018-04-24 MED ORDER — SITAGLIPTIN PHOSPHATE 100 MG PO TABS: 100.0000 mg | ORAL_TABLET | Freq: Every day | ORAL | 5 refills | Status: DC

## 2018-04-24 NOTE — Assessment & Plan Note (Signed)
Lab Results  Component Value Date   HGBA1C 8.3 (H) 02/01/2018    Sugars not ideally controlled Placed on Farxiga 2 months ago and he is having side effects and does not feel that his sugars have improved so we will discontinue this Discussed options-he would like to avoid a weekly injection We will try Januvia 100 mg daily Continue his current medications Stressed the importance of increasing his activity to help lower his sugars and avoid more medication Stressed compliance with diabetic diet Encouraged weight loss Has an appointment in July so that we can recheck his blood work at that time  He will call or MyChart with any questions or concerns

## 2018-04-24 NOTE — Progress Notes (Signed)
Virtual Visit via Video Note  I connected with [name] on 04/24/18 at  2:00 PM EDT by a video enabled telemedicine application and verified that I am speaking with the correct person using two identifiers.   I discussed the limitations of evaluation and management by telemedicine and the availability of in person appointments. The patient expressed understanding and agreed to proceed.  The patient is currently at home and I am in the office.    No referring provider.    History of Present Illness: This is an acute visit to discuss his farxiga - he is having side effects.    He has been on farxiga for two months and has had several side effects - dry mouth, increased urination up to once an hour and increased appetite.  He also has not seen much change in his sugar.  His sugars are ranging 140-175.  Last week he states his sugars are in the 170s, but the past couple of days they have been in the 140s-he has been more active these past couple of days.  He typically is not exercising regularly.  He feels he has been more compliant with a diabetic diet.  He is taking his other diabetic medications as prescribed.  He would like to consider something different other than the [place].     Social History   Socioeconomic History  . Marital status: Married    Spouse name: Izora Gala  . Number of children: Y  . Years of education: Not on file  . Highest education level: Not on file  Occupational History  . 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  Social Needs  . Financial resource strain: Not hard at all  . Food insecurity:    Worry: Never true    Inability: Never true  . Transportation needs:    Medical: No    Non-medical: No  Tobacco Use  . Smoking status: Former Smoker    Packs/day: 0.90    Years: 8.00    Pack years: 7.20    Types: Cigarettes    Last attempt to quit: 01/16/1974    Years since quitting: 44.2  . Smokeless tobacco: Never Used  . Tobacco  comment: smoked  1966- 1976,up to 1 ppd  Substance and Sexual Activity  . Alcohol use: Yes    Alcohol/week: 0.0 standard drinks    Comment: Socially; "sips socially"( essentially none)  . Drug use: No  . Sexual activity: Yes  Lifestyle  . Physical activity:    Days per week: 4 days    Minutes per session: 50 min  . Stress: Only a little  Relationships  . Social connections:    Talks on phone: More than three times a week    Gets together: More than three times a week    Attends religious service: Not on file    Active member of club or organization: Not on file    Attends meetings of clubs or organizations: Not on file    Relationship status: Married  Other Topics Concern  . Not on file  Social History Narrative  . Not on file     Observations/Objective: Appears well in NAD Breathing normally Normal mood and affect   Lab Results  Component Value Date   HGBA1C 8.3 (H) 02/01/2018     Assessment and Plan:  See Problem List for Assessment and Plan of chronic medical problems.   Follow Up Instructions:    I discussed the assessment  and treatment plan with the patient. The patient was provided an opportunity to ask questions and all were answered. The patient agreed with the plan and demonstrated an understanding of the instructions.   The patient was advised to call back or seek an in-person evaluation if the symptoms worsen or if the condition fails to improve as anticipated.      Binnie Rail, MD

## 2018-05-01 ENCOUNTER — Encounter: Payer: Self-pay | Admitting: Internal Medicine

## 2018-05-01 ENCOUNTER — Other Ambulatory Visit: Payer: Self-pay | Admitting: Internal Medicine

## 2018-05-01 MED ORDER — REPAGLINIDE 2 MG PO TABS: 4.0000 mg | ORAL_TABLET | Freq: Three times a day (TID) | ORAL | 0 refills | Status: DC

## 2018-05-28 ENCOUNTER — Other Ambulatory Visit: Payer: Self-pay

## 2018-05-28 ENCOUNTER — Encounter: Payer: Self-pay | Admitting: Internal Medicine

## 2018-05-28 MED ORDER — METFORMIN HCL ER 500 MG PO TB24: ORAL_TABLET | ORAL | 1 refills | Status: DC

## 2018-05-30 ENCOUNTER — Encounter: Payer: Self-pay | Admitting: Internal Medicine

## 2018-05-31 MED ORDER — SEMAGLUTIDE 3 MG PO TABS: 3.0000 mg | ORAL_TABLET | Freq: Every day | ORAL | 0 refills | Status: DC

## 2018-05-31 NOTE — Addendum Note (Signed)
Addended by: Binnie Rail on: 05/31/2018 12:15 PM   Modules accepted: Orders

## 2018-06-06 ENCOUNTER — Telehealth: Payer: Self-pay | Admitting: *Deleted

## 2018-06-06 NOTE — Telephone Encounter (Signed)
Pt informed of below. He states he actually filled another [place] rx on 06/05/18. He would like to hold off on the weekly injection and  try this for another 30 days and he will let you know how it goes.

## 2018-06-06 NOTE — Telephone Encounter (Signed)
ok 

## 2018-06-06 NOTE — Telephone Encounter (Signed)
See if he is ok with a once a week injection -- it is the same medication as rybelsus.  We will start at the lowest dose and increase from there

## 2018-06-06 NOTE — Telephone Encounter (Signed)
Received fax stating Rybelus is not covered and there is no covered alternative provided. Please advise.

## 2018-08-05 ENCOUNTER — Ambulatory Visit: Payer: Medicare Other | Admitting: Internal Medicine

## 2018-08-15 ENCOUNTER — Other Ambulatory Visit: Payer: Self-pay

## 2018-08-15 MED ORDER — METFORMIN HCL ER 500 MG PO TB24: ORAL_TABLET | ORAL | 1 refills | Status: DC

## 2018-08-16 ENCOUNTER — Other Ambulatory Visit: Payer: Self-pay

## 2018-08-16 MED ORDER — METFORMIN HCL ER 500 MG PO TB24: ORAL_TABLET | ORAL | 0 refills | Status: DC

## 2018-09-02 DIAGNOSIS — Z23 Encounter for immunization: Secondary | ICD-10-CM | POA: Diagnosis not present

## 2018-09-03 NOTE — Progress Notes (Signed)
Subjective:    Patient ID: [name], male    DOB: 09/30/1945, 73 y.o.   MRN: [id number]  HPI The patient is here for follow up.  He is not exercising regularly.     Diabetes: He is taking his medication daily as prescribed. He is compliant with a diabetic diet.  He monitors his sugars and they have been running 130's in the morning, 230 two after dinner. He checks his feet daily and denies foot lesions. He is up-to-date with an ophthalmology examination.   Hypothyroidism:  He is taking his medication daily.  He denies any recent changes in energy or weight that are unexplained.   Afib, Hypertension: He is taking his medication daily. He is compliant with a low sodium diet.  He denies chest pain, palpitations, edema, shortness of breath and regular headaches.  He does monitor his blood pressure at home - typically 138/70-80's.      Hyperlipidemia: He is taking his medication daily. He is compliant with a low fat/cholesterol diet. He denies myalgias.    Medications and allergies reviewed with patient and updated if appropriate.  Patient Active Problem List   Diagnosis Date Noted   Diabetes mellitus with peripheral circulatory disorder, controlled ([place]) 11/09/2016   Lumbar radiculopathy 05/05/2015   Atypical atrial flutter ([place]) 09/12/2014   Obesity 06/25/2013   Hypothyroidism 12/30/2010   OSA (obstructive sleep apnea) 09/02/2010   Atrial fibrillation ([place]) 04/05/2009   ERECTILE DYSFUNCTION 05/01/2008   Hyperlipidemia 09/06/2007   Hypertension 09/06/2007   BPH with obstruction/lower urinary tract symptoms 04/25/2007    Current Outpatient Medications on File Prior to Visit  Medication Sig Dispense Refill   latanoprost (XALATAN) 0.005 % ophthalmic solution Place 1 drop into the left eye at bedtime.     Magnesium Oxide 500 MG CAPS Take 1 capsule by mouth 2 (two) times daily.     metFORMIN (GLUCOPHAGE-XR) 500 MG 24 hr tablet Take 2 tablets (1000 mg) twice  daily. 360 tablet 0   metoprolol succinate (TOPROL-XL) 100 MG 24 hr tablet Take 1 tablet twice daily and an extra 1/2 tablet daily as needed for atrial fibrillation. Please keep upcoming appt. Thank you 225 tablet 0   rivaroxaban (XARELTO) 20 MG TABS tablet Take 1 tablet (20 mg total) by mouth daily with supper. 90 tablet 1   timolol (BETIMOL) 0.25 % ophthalmic solution 1-2 drops 2 (two) times daily.     No current facility-administered medications on file prior to visit.     Past Medical History:  Diagnosis Date   AF (atrial fibrillation) (HCC)    persistent afib, s/p afib ablation x 2 with subsequent convergent procedure at [facility]   Allergic rhinitis    seasonal   Atrial flutter ([place])    CTS (carpal tunnel syndrome)    PMH of   Diabetes mellitus    type 2   External hemorrhoids    Glaucoma    Hyperlipidemia    Hypertension    Hypothyroidism    Renal calculus 2005   [facility]    Past Surgical History:  Procedure Laterality Date   ABLATION  06/2013   atypical atrial flutter ablation by Dr Lehman Prom at [facility] (mitral annular flutter ablated)   afib 09/22/2008--11/03/2009     afib ablation x 2 by Dr Rayann Heman with subsequent convergent procedure at [place] [facility]  2005   non obstructive CAD   CARDIOVERSION     CARDIOVERSION  12/09/2010   Procedure: CARDIOVERSION;  Surgeon: Loralie Champagne, MD;  Location: [place] [facility];  Service: Cardiovascular;  Laterality: N/A;   CARDIOVERSION  09/15/2011   Procedure: CARDIOVERSION;  Surgeon: Josue Hector, MD;  Location: [facility] ENDOSCOPY;  Service: Cardiovascular;  Laterality: N/A;  office drawing labs wed 8/28   CARDIOVERSION N/A 07/18/2012   Procedure: CARDIOVERSION;  Surgeon: Thayer Headings, MD;  Location: [facility] ENDOSCOPY;  Service: Cardiovascular;  Laterality: N/A;   CARDIOVERSION  08/2014   [place]   COLONOSCOPY  2009   Dr Sharlett Iles   convergent procedure  2013   at [facility] for a [place] Right 2008,  2012   LOOP RECORDER IMPLANT Left 12-2010   TONSILLECTOMY     TOTAL HIP ARTHROPLASTY  06/2010   Depuy prosthesis replacement, Dr Alvan Dame   TOTAL HIP ARTHROPLASTY Right 2008    Depuy;Dr Simpson History   Socioeconomic History   Marital status: Married    Spouse name: Izora Gala   Number of children: 2   Years of education: Not on file   Highest education level: Not on file  Occupational History   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 Designer, fashion/clothing strain: Not hard at all   Food insecurity    Worry: Never true    Inability: Never true   Transportation needs    Medical: No    Non-medical: No  Tobacco Use   Smoking status: Former Smoker    Packs/day: 0.90    Years: 8.00    Pack years: 7.20    Types: Cigarettes    Quit date: 01/16/1974    Years since quitting: 44.6   Smokeless tobacco: Never Used   Tobacco comment: smoked  1966- 1976,up to 1 ppd  Substance and Sexual Activity   Alcohol use: Yes    Alcohol/week: 0.0 standard drinks    Comment: Socially; "sips socially"( essentially none)   Drug use: No   Sexual activity: Yes  Lifestyle   Physical activity    Days per week: 0 days    Minutes per session: 0 min   Stress: Only a little  Relationships   Social connections    Talks on phone: More than three times a week    Gets together: More than three times a week    Attends religious service: Not on file    Active member of club or organization: Not on file    Attends meetings of clubs or organizations: Not on file    Relationship status: Married  Other Topics Concern   Not on file  Social History Narrative   Not on file    Family History  Problem Relation Age of Onset   Diabetes Father    Stroke Father        mid 47s   Lung cancer Mother        non smoker   Stroke Mother 76   Testicular cancer Brother    Allergies Maternal Grandmother    Cancer Maternal Grandmother        unsure    Allergies Brother    Heart attack Neg Hx     Review of Systems  Constitutional: Negative for chills and fever.  Respiratory: Negative for cough, shortness of breath and wheezing.   Cardiovascular: Positive for palpitations (occasionally, brief, no associated symptoms, may have increased) and leg swelling (mild). Negative for chest pain.  Neurological: Negative for light-headedness, numbness and headaches.  Objective:   Vitals:   09/04/18 0922  BP: (!) 158/84  Pulse: 70  Resp: 16  Temp: 97.9 F (36.6 C)  SpO2: 99%   BP Readings from Last 3 Encounters:  09/04/18 (!) 158/84  09/04/18 (!) 158/84  02/01/18 (!) 142/68   Wt Readings from Last 3 Encounters:  09/04/18 193 lb (87.5 kg)  09/04/18 193 lb 6.4 oz (87.7 kg)  02/01/18 189 lb (85.7 kg)   Body mass index is 29.41 kg/m.   Physical Exam    Constitutional: Appears well-developed and well-nourished. No distress.  HENT:  Head: Normocephalic and atraumatic.  Neck: Neck supple. No tracheal deviation present. No thyromegaly present.  No cervical lymphadenopathy Cardiovascular: Normal rate, regular rhythm and normal heart sounds.   No murmur heard. No carotid bruit .  No edema Pulmonary/Chest: Effort normal and breath sounds normal. No respiratory distress. No has no wheezes. No rales.  Skin: Skin is warm and dry. Not diaphoretic.  Psychiatric: Normal mood and affect. Behavior is normal.    Diabetic Foot Exam - Simple   Simple Foot Form Diabetic Foot exam was performed with the following findings: Yes 09/04/2018 10:01 AM  Visual Inspection No deformities, no ulcerations, no other skin breakdown bilaterally: Yes Sensation Testing Intact to touch and monofilament testing bilaterally: Yes Pulse Check Posterior Tibialis and Dorsalis pulse intact bilaterally: Yes Comments      Assessment & Plan:    See Problem List for Assessment and Plan of chronic medical problems.

## 2018-09-03 NOTE — Progress Notes (Addendum)
Subjective:   [name] is a 73 y.o. male who presents for Medicare Annual/Subsequent preventive examination.  Review of Systems:   Cardiac Risk Factors include: advanced age (>6men, >55 women);diabetes mellitus;male gender;hypertension Sleep patterns: feels rested on waking, gets up 1-2 times nightly to void and sleeps 6-7 hours nightly.    Home Safety/Smoke Alarms: Feels safe in home. Smoke alarms in place.  Living environment; residence and Firearm Safety: 1-story house/ trailer. Lives with wife, no needs for DME, good support system Seat Belt Safety/Bike Helmet: Wears seat belt.   PSA-  Lab Results  Component Value Date   PSA 0.76 04/25/2007       Objective:    Vitals: There were no vitals taken for this visit.  There is no height or weight on file to calculate BMI.  Advanced Directives 09/04/2018 06/26/2017 02/04/2016 07/18/2012 09/15/2011 12/09/2010  Does Patient Have a Medical Advance Directive? Yes Yes No Patient does not have advance directive Patient does not have advance directive -  Type of Advance Directive [place];Living will [place];Living will - - - -  Copy of [place] in Chart? No - copy requested No - copy requested - - - -  Pre-existing out of facility DNR order (yellow form or pink MOST form) - - - No No No    Tobacco Social History   Tobacco Use  Smoking Status Former Smoker  . Packs/day: 0.90  . Years: 8.00  . Pack years: 7.20  . Types: Cigarettes  . Quit date: 01/16/1974  . Years since quitting: 44.6  Smokeless Tobacco Never Used  Tobacco Comment   smoked  1966- 1976,up to 1 ppd     Counseling given: Not Answered Comment: smoked  1966- 1976,up to 1 ppd  Past Medical History:  Diagnosis Date  . AF (atrial fibrillation) (HCC)    persistent afib, s/p afib ablation x 2 with subsequent convergent procedure at [facility]  . Allergic rhinitis    seasonal  . Atrial flutter ([place])   . CTS  (carpal tunnel syndrome)    PMH of  . Diabetes mellitus    type 2  . External hemorrhoids   . Glaucoma   . Hyperlipidemia   . Hypertension   . Hypothyroidism   . Renal calculus 2005   [facility]   Past Surgical History:  Procedure Laterality Date  . ABLATION  06/2013   atypical atrial flutter ablation by Dr Lehman Prom at [facility] (mitral annular flutter ablated)  . afib 09/22/2008--11/03/2009     afib ablation x 2 by Dr Rayann Heman with subsequent convergent procedure at [facility]  . CARDIAC CATHETERIZATION  2005   non obstructive CAD  . CARDIOVERSION    . CARDIOVERSION  12/09/2010   Procedure: CARDIOVERSION;  Surgeon: Loralie Champagne, MD;  Location: [place];  Service: Cardiovascular;  Laterality: N/A;  . CARDIOVERSION  09/15/2011   Procedure: CARDIOVERSION;  Surgeon: Josue Hector, MD;  Location: [place];  Service: Cardiovascular;  Laterality: N/A;  office drawing labs wed 8/28  . CARDIOVERSION N/A 07/18/2012   Procedure: CARDIOVERSION;  Surgeon: Thayer Headings, MD;  Location: [place];  Service: Cardiovascular;  Laterality: N/A;  . CARDIOVERSION  08/2014   [place]  . COLONOSCOPY  2009   Dr Sharlett Iles  . convergent procedure  2013   at [facility] for a fib  . JOINT REPLACEMENT Right 2008, 2012  . LOOP RECORDER IMPLANT Left 12-2010  . TONSILLECTOMY    .  TOTAL HIP ARTHROPLASTY  06/2010   Depuy prosthesis replacement, Dr Alvan Dame  . TOTAL HIP ARTHROPLASTY Right 2008    Depuy;Dr Alvan Dame   Family History  Problem Relation Age of Onset  . Diabetes Father   . Stroke Father        mid 35s  . Lung cancer Mother        non smoker  . Stroke Mother 40  . Testicular cancer Brother   . Allergies Maternal Grandmother   . Cancer Maternal Grandmother        unsure  . Allergies Brother   . Heart attack Neg Hx    Social History   Socioeconomic History  . Marital status: Married    Spouse name: Izora Gala  . Number of children: 2  . Years of education: Not on file  . Highest education level: Not on  file  Occupational History  . 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  Social Needs  . Financial resource strain: Not hard at all  . Food insecurity    Worry: Never true    Inability: Never true  . Transportation needs    Medical: No    Non-medical: No  Tobacco Use  . Smoking status: Former Smoker    Packs/day: 0.90    Years: 8.00    Pack years: 7.20    Types: Cigarettes    Quit date: 01/16/1974    Years since quitting: 44.6  . Smokeless tobacco: Never Used  . Tobacco comment: smoked  1966- 1976,up to 1 ppd  Substance and Sexual Activity  . Alcohol use: Yes    Alcohol/week: 0.0 standard drinks    Comment: Socially; "sips socially"( essentially none)  . Drug use: No  . Sexual activity: Yes  Lifestyle  . Physical activity    Days per week: 0 days    Minutes per session: 0 min  . Stress: Only a little  Relationships  . Social connections    Talks on phone: More than three times a week    Gets together: More than three times a week    Attends religious service: Not on file    Active member of club or organization: Not on file    Attends meetings of clubs or organizations: Not on file    Relationship status: Married  Other Topics Concern  . Not on file  Social History Narrative  . Not on file    Outpatient Encounter Medications as of 09/04/2018  Medication Sig  . amLODipine (NORVASC) 10 MG tablet Take 1 tablet (10 mg total) by mouth daily.  Marland Kitchen atorvastatin (LIPITOR) 10 MG tablet Take 1 tablet (10 mg total) by mouth daily.  Marland Kitchen glimepiride (AMARYL) 4 MG tablet Take 1 tablet (4 mg total) by mouth daily before breakfast.  . latanoprost (XALATAN) 0.005 % ophthalmic solution Place 1 drop into the left eye at bedtime.  Marland Kitchen levothyroxine (SYNTHROID) 75 MCG tablet Take 1 tablet (75 mcg total) by mouth daily before breakfast.  . Magnesium Oxide 500 MG CAPS Take 1 capsule by mouth 2 (two) times daily.  . metFORMIN (GLUCOPHAGE-XR) 500 MG 24 hr tablet Take 2 tablets  (1000 mg) twice daily.  . metoprolol succinate (TOPROL-XL) 100 MG 24 hr tablet Take 1 tablet twice daily and an extra 1/2 tablet daily as needed for atrial fibrillation. Please keep upcoming appt. Thank you  . repaglinide (PRANDIN) 2 MG tablet Take 2 tablets (4 mg total) by mouth 3 (three) times daily before  meals. Take 15-30 min prior to meal  . rivaroxaban (XARELTO) 20 MG TABS tablet Take 1 tablet (20 mg total) by mouth daily with supper.  . sitaGLIPtin (JANUVIA) 100 MG tablet Take 1 tablet (100 mg total) by mouth daily.  . timolol (BETIMOL) 0.25 % ophthalmic solution 1-2 drops 2 (two) times daily.  . [DISCONTINUED] amLODipine (NORVASC) 10 MG tablet Take 1 tablet (10 mg total) by mouth daily.  . [DISCONTINUED] atorvastatin (LIPITOR) 10 MG tablet Take 1 tablet (10 mg total) by mouth daily.  . [DISCONTINUED] fluticasone (FLONASE) 50 MCG/ACT nasal spray Place 2 sprays into both nostrils daily.  . [DISCONTINUED] glimepiride (AMARYL) 4 MG tablet TAKE 1 TABLET DAILY BEFORE BREAKFAST  . [DISCONTINUED] levothyroxine (SYNTHROID, LEVOTHROID) 75 MCG tablet Take 1 tablet (75 mcg total) by mouth daily before breakfast.  . [DISCONTINUED] repaglinide (PRANDIN) 2 MG tablet Take 2 tablets (4 mg total) by mouth 3 (three) times daily before meals. Take 15-30 min prior to meal  . [DISCONTINUED] sitaGLIPtin (JANUVIA) 100 MG tablet Take 1 tablet (100 mg total) by mouth daily.   No facility-administered encounter medications on file as of 09/04/2018.     Activities of Daily Living In your present state of health, do you have any difficulty performing the following activities: 09/04/2018  Hearing? N  Vision? N  Difficulty concentrating or making decisions? N  Walking or climbing stairs? N  Dressing or bathing? N  Doing errands, shopping? N  Preparing Food and eating ? N  Using the Toilet? N  In the past six months, have you accidently leaked urine? N  Do you have problems with loss of bowel control? N  Managing  your Medications? N  Managing your Finances? N  Housekeeping or managing your Housekeeping? N  Some recent data might be hidden    Patient Care Team: Binnie Rail, MD as PCP - General (Internal Medicine) Thompson Grayer, MD as PCP - Cardiology (Cardiology)   Assessment:   This is a routine wellness examination for Khali. Physical assessment deferred to PCP.  Exercise Activities and Dietary recommendations Current Exercise Habits: The patient does not participate in regular exercise at present(Resource for [facility] senior exercise TV program provided.), Exercise limited by: None identified  Diet (meal preparation, eat out, water intake, caffeinated beverages, dairy products, fruits and vegetables): in general, a "healthy" diet     Reviewed heart healthy and diabetic diet. Encouraged patient to increase daily water and healthy fluid intake.  Discussed supplementing with Glucerna, samples and coupons provided. Encouraged patient to increase daily water and healthy fluid intake. Relevant patient education assigned to patient using Emmi.  Goals    . Patient Stated     Lose weight by monitoring my diet and eating more veg. Increase my activity by starting to walk again.     . Patient Stated     I want to increase my physical activity by walking more routinely.       Fall Risk Fall Risk  06/26/2017 11/09/2016 09/28/2015 09/10/2015 05/12/2014  Falls in the past year? No No No No No  Comment - - - Emmi Telephone Survey: data to providers prior to load -   Depression Screen PHQ 2/9 Scores 09/04/2018 06/26/2017 11/09/2016 09/28/2015  PHQ - 2 Score 0 0 0 0  PHQ- 9 Score - 1 - -    Cognitive Function       Ad8 score reviewed for issues:  Issues making decisions: no  Less interest in hobbies / activities: no  Repeats questions, stories (family complaining): no  Trouble using ordinary gadgets (microwave, computer, phone):no  Forgets the month or year: no  Mismanaging finances: no   Remembering appts: no  Daily problems with thinking and/or memory: no Ad8 score is= 0  Immunization History  Administered Date(s) Administered  . Fluad Quad(high Dose 65+) 09/02/2018  . Hepatitis A 03/27/2000, 10/16/2000  . Influenza Whole 10/17/2011  . Influenza, High Dose Seasonal PF 09/28/2015, 10/13/2016, 09/28/2017  . Influenza,inj,Quad PF,6+ Mos 10/16/2012, 09/29/2014, 10/13/2016  . Influenza-Unspecified 10/30/2013, 09/02/2018  . Pneumococcal Conjugate-13 02/03/2015  . Pneumococcal Polysaccharide-23 04/11/2013  . Tdap 09/21/2006  . Typhoid Inactivated 03/27/2000  . Typhoid Live 10/19/2003  . Zoster 01/05/2010   Screening Tests Health Maintenance  Topic Date Due  . TETANUS/TDAP  09/20/2016  . OPHTHALMOLOGY EXAM  05/16/2018  . HEMOGLOBIN A1C  08/02/2018  . URINE MICROALBUMIN  02/02/2019  . FOOT EXAM  09/04/2019  . COLONOSCOPY  09/04/2022  . INFLUENZA VACCINE  Completed  . Hepatitis C Screening  Completed  . PNA vac Low Risk Adult  Completed       Plan:    Reviewed health maintenance screenings with patient today and relevant education, vaccines, and/or referrals were provided.   I have personally reviewed and noted the following in the patient's chart:   . Medical and social history . Use of alcohol, tobacco or illicit drugs  . Current medications and supplements . Functional ability and status . Nutritional status . Physical activity . Advanced directives . List of other physicians . Vitals . Screenings to include cognitive, depression, and falls . Referrals and appointments  In addition, I have reviewed and discussed with patient certain preventive protocols, quality metrics, and best practice recommendations. A written personalized care plan for preventive services as well as general preventive health recommendations were provided to patient.     Michiel Cowboy, RN  09/04/2018    Medical screening examination/treatment/procedure(s) were performed by  non-physician practitioner and as supervising physician I was immediately available for consultation/collaboration. I agree with above. Binnie Rail, MD

## 2018-09-03 NOTE — Patient Instructions (Addendum)
  Tests ordered today. Your results will be released to [place] (or called to you) after review.  If any changes need to be made, you will be notified at that same time.  Medications reviewed and updated.  Changes include :   none  Your prescription(s) have been submitted to your pharmacy. Please take as directed and contact our office if you believe you are having problem(s) with the medication(s).   Please followup in 3 months

## 2018-09-04 ENCOUNTER — Other Ambulatory Visit: Payer: Self-pay

## 2018-09-04 ENCOUNTER — Encounter: Payer: Self-pay | Admitting: Internal Medicine

## 2018-09-04 ENCOUNTER — Ambulatory Visit (INDEPENDENT_AMBULATORY_CARE_PROVIDER_SITE_OTHER): Payer: Medicare Other | Admitting: Internal Medicine

## 2018-09-04 ENCOUNTER — Ambulatory Visit (INDEPENDENT_AMBULATORY_CARE_PROVIDER_SITE_OTHER): Payer: Medicare Other | Admitting: *Deleted

## 2018-09-04 ENCOUNTER — Other Ambulatory Visit (INDEPENDENT_AMBULATORY_CARE_PROVIDER_SITE_OTHER): Payer: Medicare Other

## 2018-09-04 VITALS — BP 158/84 | HR 70 | Temp 97.9°F | Resp 16 | Ht 68.0 in | Wt 193.4 lb

## 2018-09-04 VITALS — BP 158/84 | HR 70 | Resp 16 | Ht 68.0 in | Wt 193.0 lb

## 2018-09-04 DIAGNOSIS — Z0000 Encounter for general adult medical examination without abnormal findings: Secondary | ICD-10-CM | POA: Diagnosis not present

## 2018-09-04 DIAGNOSIS — I4891 Unspecified atrial fibrillation: Secondary | ICD-10-CM

## 2018-09-04 DIAGNOSIS — I10 Essential (primary) hypertension: Secondary | ICD-10-CM

## 2018-09-04 DIAGNOSIS — E039 Hypothyroidism, unspecified: Secondary | ICD-10-CM | POA: Diagnosis not present

## 2018-09-04 DIAGNOSIS — E7849 Other hyperlipidemia: Secondary | ICD-10-CM

## 2018-09-04 DIAGNOSIS — E1151 Type 2 diabetes mellitus with diabetic peripheral angiopathy without gangrene: Secondary | ICD-10-CM | POA: Diagnosis not present

## 2018-09-04 LAB — CBC WITH DIFFERENTIAL/PLATELET
Basophils Absolute: 0.1 10*3/uL (ref 0.0–0.1)
Basophils Relative: 2 % (ref 0.0–3.0)
Eosinophils Absolute: 0.7 10*3/uL (ref 0.0–0.7)
Eosinophils Relative: 10.1 % — ABNORMAL HIGH (ref 0.0–5.0)
HCT: 47.1 % (ref 39.0–52.0)
Hemoglobin: 16.1 g/dL (ref 13.0–17.0)
Lymphocytes Relative: 26.7 % (ref 12.0–46.0)
Lymphs Abs: 1.9 10*3/uL (ref 0.7–4.0)
MCHC: 34.1 g/dL (ref 30.0–36.0)
MCV: 87.3 fl (ref 78.0–100.0)
Monocytes Absolute: 0.9 10*3/uL (ref 0.1–1.0)
Monocytes Relative: 12.2 % — ABNORMAL HIGH (ref 3.0–12.0)
Neutro Abs: 3.5 10*3/uL (ref 1.4–7.7)
Neutrophils Relative %: 49 % (ref 43.0–77.0)
Platelets: 202 10*3/uL (ref 150.0–400.0)
RBC: 5.39 Mil/uL (ref 4.22–5.81)
RDW: 14.3 % (ref 11.5–15.5)
WBC: 7.1 10*3/uL (ref 4.0–10.5)

## 2018-09-04 LAB — COMPREHENSIVE METABOLIC PANEL
ALT: 21 U/L (ref 0–53)
AST: 17 U/L (ref 0–37)
Albumin: 4.7 g/dL (ref 3.5–5.2)
Alkaline Phosphatase: 48 U/L (ref 39–117)
BUN: 19 mg/dL (ref 6–23)
CO2: 25 mEq/L (ref 19–32)
Calcium: 9.8 mg/dL (ref 8.4–10.5)
Chloride: 100 mEq/L (ref 96–112)
Creatinine, Ser: 0.92 mg/dL (ref 0.40–1.50)
GFR: 80.9 mL/min (ref >60.00)
Glucose, Bld: 200 mg/dL — ABNORMAL HIGH (ref 70–99)
Potassium: 4.8 mEq/L (ref 3.5–5.1)
Sodium: 137 mEq/L (ref 135–145)
Total Bilirubin: 0.7 mg/dL (ref 0.2–1.2)
Total Protein: 7.8 g/dL (ref 6.0–8.3)

## 2018-09-04 LAB — TSH: TSH: 3.48 u[IU]/mL (ref 0.35–4.50)

## 2018-09-04 LAB — LIPID PANEL
Cholesterol: 120 mg/dL (ref 0–200)
HDL: 51 mg/dL (ref >39.00)
LDL Cholesterol: 40 mg/dL (ref 0–99)
NonHDL: 68.58
Total CHOL/HDL Ratio: 2
Triglycerides: 141 mg/dL (ref 0.0–149.0)
VLDL: 28.2 mg/dL (ref 0.0–40.0)

## 2018-09-04 LAB — HEMOGLOBIN A1C: Hgb A1c MFr Bld: 8.4 % — ABNORMAL HIGH (ref 4.6–6.5)

## 2018-09-04 MED ORDER — SITAGLIPTIN PHOSPHATE 100 MG PO TABS: 100.0000 mg | ORAL_TABLET | Freq: Every day | ORAL | 1 refills | Status: DC

## 2018-09-04 MED ORDER — REPAGLINIDE 2 MG PO TABS: 4.0000 mg | ORAL_TABLET | Freq: Three times a day (TID) | ORAL | 1 refills | Status: DC

## 2018-09-04 MED ORDER — GLIMEPIRIDE 4 MG PO TABS: 4.0000 mg | ORAL_TABLET | Freq: Every day | ORAL | 1 refills | Status: DC

## 2018-09-04 MED ORDER — AMLODIPINE BESYLATE 10 MG PO TABS: 10.0000 mg | ORAL_TABLET | Freq: Every day | ORAL | 1 refills | Status: DC

## 2018-09-04 MED ORDER — LEVOTHYROXINE SODIUM 75 MCG PO TABS: 75.0000 ug | ORAL_TABLET | Freq: Every day | ORAL | 1 refills | Status: DC

## 2018-09-04 MED ORDER — ATORVASTATIN CALCIUM 10 MG PO TABS: 10.0000 mg | ORAL_TABLET | Freq: Every day | ORAL | 1 refills | Status: DC

## 2018-09-04 NOTE — Patient Instructions (Addendum)
If you cannot attend class in person, you can still exercise at home. Video taped versions of AHOY classes are shown on [employer] (GTN) at 8 am and 1 pm Mondays through Fridays. You can also purchase a copy of the AHOY DVD by calling [place] (GTN) [employer]. GTN is available on [employer] channel 13 with a digital cable box and on [employer] channel 31. GTN is also available on [employer], channel 99. To view GTN, go to channel 99, press OK, select [place], then select GTN to start the channel.  Continue doing brain stimulating activities (puzzles, reading, adult coloring books, staying active) to keep memory sharp.   Continue to eat heart healthy diet (full of fruits, vegetables, whole grains, lean protein, water--limit salt, fat, and sugar intake) and increase physical activity as tolerated.   [name] , Thank you for taking time to come for your Medicare Wellness Visit. I appreciate your ongoing commitment to your health goals. Please review the following plan we discussed and let me know if I can assist you in the future.   These are the goals we discussed: Goals    . Patient Stated     Lose weight by monitoring my diet and eating more veg. Increase my activity by starting to walk again.     . Patient Stated     I want to increase my physical activity by walking more routinely.       This is a list of the screening recommended for you and due dates:  Health Maintenance  Topic Date Due  . Tetanus Vaccine  09/20/2016  . Eye exam for diabetics  05/16/2018  . Hemoglobin A1C  08/02/2018  . Urine Protein Check  02/02/2019  . Complete foot exam   09/04/2019  . Colon Cancer Screening  09/04/2022  . Flu Shot  Completed  .  Hepatitis C: One time screening is recommended by Center for Disease Control  (CDC) for  adults born from [birth year] through 1964.   Completed  . Pneumonia vaccines  Completed    Preventive Care 24 Years and  Older, Male Preventive care refers to lifestyle choices and visits with your health care provider that can promote health and wellness. This includes:  A yearly physical exam. This is also called an annual well check.  Regular dental and eye exams.  Immunizations.  Screening for certain conditions.  Healthy lifestyle choices, such as diet and exercise. What can I expect for my preventive care visit? Physical exam Your health care provider will check:  Height and weight. These may be used to calculate body mass index (BMI), which is a measurement that tells if you are at a healthy weight.  Heart rate and blood pressure.  Your skin for abnormal spots. Counseling Your health care provider may ask you questions about:  Alcohol, tobacco, and drug use.  Emotional well-being.  Home and relationship well-being.  Sexual activity.  Eating habits.  History of falls.  Memory and ability to understand (cognition).  Work and work Statistician. What immunizations do I need?  Influenza (flu) vaccine  This is recommended every year. Tetanus, diphtheria, and pertussis (Tdap) vaccine  You may need a Td booster every 10 years. Varicella (chickenpox) vaccine  You may need this vaccine if you have not already been vaccinated. Zoster (shingles) vaccine  You may need this after age 68. Pneumococcal conjugate (PCV13) vaccine  One dose is recommended after age 3. Pneumococcal polysaccharide (PPSV23) vaccine  One  dose is recommended after age 29. Measles, mumps, and rubella (MMR) vaccine  You may need at least one dose of MMR if you were born in 1957 or later. You may also need a second dose. Meningococcal conjugate (MenACWY) vaccine  You may need this if you have certain conditions. Hepatitis A vaccine  You may need this if you have certain conditions or if you travel or work in places where you may be exposed to hepatitis A. Hepatitis B vaccine  You may need this if you  have certain conditions or if you travel or work in places where you may be exposed to hepatitis B. Haemophilus influenzae type b (Hib) vaccine  You may need this if you have certain conditions. You may receive vaccines as individual doses or as more than one vaccine together in one shot (combination vaccines). Talk with your health care provider about the risks and benefits of combination vaccines. What tests do I need? Blood tests  Lipid and cholesterol levels. These may be checked every 5 years, or more frequently depending on your overall health.  Hepatitis C test.  Hepatitis B test. Screening  Lung cancer screening. You may have this screening every year starting at age 54 if you have a 30-pack-year history of smoking and currently smoke or have quit within the past 15 years.  Colorectal cancer screening. All adults should have this screening starting at age 54 and continuing until age 64. Your health care provider may recommend screening at age 3 if you are at increased risk. You will have tests every 1-10 years, depending on your results and the type of screening test.  Prostate cancer screening. Recommendations will vary depending on your family history and other risks.  Diabetes screening. This is done by checking your blood sugar (glucose) after you have not eaten for a while (fasting). You may have this done every 1-3 years.  Abdominal aortic aneurysm (AAA) screening. You may need this if you are a current or former smoker.  Sexually transmitted disease (STD) testing. Follow these instructions at home: Eating and drinking  Eat a diet that includes fresh fruits and vegetables, whole grains, lean protein, and low-fat dairy products. Limit your intake of foods with high amounts of sugar, saturated fats, and salt.  Take vitamin and mineral supplements as recommended by your health care provider.  Do not drink alcohol if your health care provider tells you not to drink.  If  you drink alcohol: ? Limit how much you have to 0-2 drinks a day. ? Be aware of how much alcohol is in your drink. In the [place] one drink equals one 12 oz bottle of beer (355 mL), one 5 oz glass of [place] (148 mL), or one 1 oz glass of hard liquor (44 mL). Lifestyle  Take daily care of your teeth and gums.  Stay active. Exercise for at least 30 minutes on 5 or more days each week.  Do not use any products that contain nicotine or tobacco, such as cigarettes, e-cigarettes, and chewing tobacco. If you need help quitting, ask your health care provider.  If you are sexually active, practice safe sex. Use a condom or other form of protection to prevent STIs (sexually transmitted infections).  Talk with your health care provider about taking a low-dose aspirin or statin. What's next?  Visit your health care provider once a year for a well check visit.  Ask your health care provider how often you should have your eyes and teeth checked.  Stay up to date on all vaccines. This information is not intended to replace advice given to you by your health care provider. Make sure you discuss any questions you have with your health care provider. Document Released: 01/29/2015 Document Revised: 12/27/2017 Document Reviewed: 12/27/2017 Elsevier Patient Education  2020 [employer].

## 2018-09-04 NOTE — Assessment & Plan Note (Signed)
Rate controlled occ palpitations Following with cardiology Cmp, cbc, tsh

## 2018-09-04 NOTE — Assessment & Plan Note (Signed)
Clinically euthyroid Check tsh  Titrate med dose if needed  

## 2018-09-04 NOTE — Assessment & Plan Note (Signed)
a1c today Sugars not ideally controlled Continue current medications If a1c elevated will try jardiance at low dose - did not tolerate farxiga, but hopefully will tolerate jardiance - if that is not tolerated will increase glimepiride or try insulin Non-insulin injectables are too expensive stressed regular exercise and weight loss

## 2018-09-04 NOTE — Assessment & Plan Note (Signed)
BP slightly elevated He will monitor at home Continue current medications Stressed exercise, weight loss Low sodium diet cmp

## 2018-09-04 NOTE — Assessment & Plan Note (Signed)
Check lipid panel  Continue daily statin Regular exercise and healthy diet encouraged  

## 2018-09-05 ENCOUNTER — Encounter: Payer: Self-pay | Admitting: Internal Medicine

## 2018-09-07 ENCOUNTER — Encounter: Payer: Self-pay | Admitting: Internal Medicine

## 2018-09-07 MED ORDER — METOPROLOL SUCCINATE ER 100 MG PO TB24: ORAL_TABLET | ORAL | 1 refills | Status: DC

## 2018-09-09 ENCOUNTER — Telehealth: Payer: Self-pay | Admitting: Internal Medicine

## 2018-09-09 ENCOUNTER — Other Ambulatory Visit (INDEPENDENT_AMBULATORY_CARE_PROVIDER_SITE_OTHER): Payer: Medicare Other

## 2018-09-09 DIAGNOSIS — R300 Dysuria: Secondary | ICD-10-CM | POA: Diagnosis not present

## 2018-09-09 NOTE — Telephone Encounter (Signed)
Urine put in. Pt is aware. Will swing by the lab to give sample

## 2018-09-09 NOTE — Telephone Encounter (Signed)
Pt was seen 8.19. Would you like another appt?

## 2018-09-09 NOTE — Telephone Encounter (Signed)
Pt is experiencing uti symptoms and would like something called in.  He is having burning and frequency.  He was seen last week and was prescribed new meds, but he says he didn't start them yet, so his symptoms are not related to new meds.  [facility] [address]

## 2018-09-09 NOTE — Telephone Encounter (Signed)
Since he was just seen we just have him drop off a urine sample to make sure he has an infection.  UA, UCx

## 2018-09-10 LAB — URINALYSIS
Bilirubin Urine: NEGATIVE
Hgb urine dipstick: NEGATIVE
Ketones, ur: NEGATIVE
Leukocytes,Ua: NEGATIVE
Nitrite: NEGATIVE
Specific Gravity, Urine: 1.015 (ref 1.000–1.030)
Urine Glucose: >=1000 — AB
Urobilinogen, UA: 0.2 (ref 0.0–1.0)
pH: 6.5 (ref 5.0–8.0)

## 2018-09-11 ENCOUNTER — Other Ambulatory Visit: Payer: Self-pay | Admitting: Internal Medicine

## 2018-09-11 LAB — URINE CULTURE
MICRO NUMBER:: 803482
SPECIMEN QUALITY:: ADEQUATE

## 2018-09-11 MED ORDER — AMOXICILLIN-POT CLAVULANATE 875-125 MG PO TABS: 1.0000 | ORAL_TABLET | Freq: Two times a day (BID) | ORAL | 0 refills | Status: DC

## 2018-09-25 ENCOUNTER — Encounter: Payer: Self-pay | Admitting: Internal Medicine

## 2018-09-26 MED ORDER — JARDIANCE 10 MG PO TABS: 10.0000 mg | ORAL_TABLET | Freq: Every day | ORAL | 1 refills | Status: DC

## 2018-10-27 ENCOUNTER — Encounter: Payer: Self-pay | Admitting: Internal Medicine

## 2018-11-07 DIAGNOSIS — H4052X1 Glaucoma secondary to other eye disorders, left eye, mild stage: Secondary | ICD-10-CM | POA: Diagnosis not present

## 2018-11-07 DIAGNOSIS — H401111 Primary open-angle glaucoma, right eye, mild stage: Secondary | ICD-10-CM | POA: Diagnosis not present

## 2018-11-21 DIAGNOSIS — H4052X1 Glaucoma secondary to other eye disorders, left eye, mild stage: Secondary | ICD-10-CM | POA: Diagnosis not present

## 2018-11-21 DIAGNOSIS — H401111 Primary open-angle glaucoma, right eye, mild stage: Secondary | ICD-10-CM | POA: Diagnosis not present

## 2018-11-21 DIAGNOSIS — Z125 Encounter for screening for malignant neoplasm of prostate: Secondary | ICD-10-CM | POA: Diagnosis not present

## 2018-11-28 DIAGNOSIS — N401 Enlarged prostate with lower urinary tract symptoms: Secondary | ICD-10-CM | POA: Diagnosis not present

## 2018-11-28 DIAGNOSIS — N5201 Erectile dysfunction due to arterial insufficiency: Secondary | ICD-10-CM | POA: Diagnosis not present

## 2018-11-28 DIAGNOSIS — R351 Nocturia: Secondary | ICD-10-CM | POA: Diagnosis not present

## 2018-12-04 NOTE — Patient Instructions (Addendum)
  Tests ordered today. Your results will be released to [place] (or called to you) after review.  If any changes need to be made, you will be notified at that same time.   Medications reviewed and updated.  Changes include :   none  Your prescription(s) have been submitted to your pharmacy. Please take as directed and contact our office if you believe you are having problem(s) with the medication(s).    Please followup in 4  months

## 2018-12-04 NOTE — Progress Notes (Addendum)
Subjective:    Patient ID: [name], male    DOB: 10-04-1945, 73 y.o.   MRN: [id number]  HPI The patient is here for follow up.  He is exercising irregularly.     Diabetes: He is taking his medication daily as prescribed.  He does have itching in the genital region from the [place], but states it is tolerable.  He believes her sugars are better controlled with that medication.  He is more compliant with a diabetic diet.  He monitors his sugars and they have been running 120-130 on average, lately it has been higher on average.    Hyperlipidemia: He is taking his medication daily. He is compliant with a low fat/cholesterol diet. He denies myalgias.   Hypertension: He is taking his medication daily. He is compliant with a low sodium diet.  He denies chest pain, palpitations, edema, shortness of breath and regular headaches.  He does monitor his blood pressure at home - 130/70's.    Hypothyroidism:  He is taking his medication daily.  He denies any recent changes in energy or weight that are unexplained.     Medications and allergies reviewed with patient and updated if appropriate.  Patient Active Problem List   Diagnosis Date Noted  . Diabetes mellitus with peripheral circulatory disorder, controlled ([place]) 11/09/2016  . Lumbar radiculopathy 05/05/2015  . Atypical atrial flutter ([place]) 09/12/2014  . Obesity 06/25/2013  . Hypothyroidism 12/30/2010  . OSA (obstructive sleep apnea) 09/02/2010  . Atrial fibrillation ([place]) 04/05/2009  . ERECTILE DYSFUNCTION 05/01/2008  . Hyperlipidemia 09/06/2007  . Hypertension 09/06/2007  . BPH with obstruction/lower urinary tract symptoms 04/25/2007    Current Outpatient Medications on File Prior to Visit  Medication Sig Dispense Refill  . amLODipine (NORVASC) 10 MG tablet Take 1 tablet (10 mg total) by mouth daily. 90 tablet 1  . atorvastatin (LIPITOR) 10 MG tablet Take 1 tablet (10 mg total) by mouth daily. 90 tablet 1  .  glimepiride (AMARYL) 4 MG tablet Take 1 tablet (4 mg total) by mouth daily before breakfast. 90 tablet 1  . latanoprost (XALATAN) 0.005 % ophthalmic solution Place 1 drop into the left eye at bedtime.    Marland Kitchen levothyroxine (SYNTHROID) 75 MCG tablet Take 1 tablet (75 mcg total) by mouth daily before breakfast. 90 tablet 1  . Magnesium Oxide 500 MG CAPS Take 1 capsule by mouth 2 (two) times daily.    . metFORMIN (GLUCOPHAGE-XR) 500 MG 24 hr tablet Take 2 tablets (1000 mg) twice daily. 360 tablet 0  . metoprolol succinate (TOPROL-XL) 100 MG 24 hr tablet Take 1 tablet twice daily and an extra 1/2 tablet daily as needed for atrial fibrillation. 225 tablet 1  . repaglinide (PRANDIN) 2 MG tablet Take 2 tablets (4 mg total) by mouth 3 (three) times daily before meals. Take 15-30 min prior to meal 540 tablet 1  . timolol (BETIMOL) 0.25 % ophthalmic solution 1-2 drops 2 (two) times daily.     No current facility-administered medications on file prior to visit.     Past Medical History:  Diagnosis Date  . AF (atrial fibrillation) (HCC)    persistent afib, s/p afib ablation x 2 with subsequent convergent procedure at [facility]  . Allergic rhinitis    seasonal  . Atrial flutter ([place])   . CTS (carpal tunnel syndrome)    PMH of  . Diabetes mellitus    type 2  . External hemorrhoids   . Glaucoma   . Hyperlipidemia   .  Hypertension   . Hypothyroidism   . Renal calculus 2005   [facility]    Past Surgical History:  Procedure Laterality Date  . ABLATION  06/2013   atypical atrial flutter ablation by Dr Lehman Prom at [facility] (mitral annular flutter ablated)  . afib 09/22/2008--11/03/2009     afib ablation x 2 by Dr Rayann Heman with subsequent convergent procedure at [facility]  . CARDIAC CATHETERIZATION  2005   non obstructive CAD  . CARDIOVERSION    . CARDIOVERSION  12/09/2010   Procedure: CARDIOVERSION;  Surgeon: Loralie Champagne, MD;  Location: [place];  Service: Cardiovascular;  Laterality: N/A;  . CARDIOVERSION   09/15/2011   Procedure: CARDIOVERSION;  Surgeon: Josue Hector, MD;  Location: [place];  Service: Cardiovascular;  Laterality: N/A;  office drawing labs wed 8/28  . CARDIOVERSION N/A 07/18/2012   Procedure: CARDIOVERSION;  Surgeon: Thayer Headings, MD;  Location: [place];  Service: Cardiovascular;  Laterality: N/A;  . CARDIOVERSION  08/2014   [place]  . COLONOSCOPY  2009   Dr Sharlett Iles  . convergent procedure  2013   at [facility] for a fib  . JOINT REPLACEMENT Right 2008, 2012  . LOOP RECORDER IMPLANT Left 12-2010  . TONSILLECTOMY    . TOTAL HIP ARTHROPLASTY  06/2010   Depuy prosthesis replacement, Dr Alvan Dame  . TOTAL HIP ARTHROPLASTY Right 2008    Depuy;Dr Alvan Dame    Social History   Socioeconomic History  . Marital status: Married    Spouse name: Izora Gala  . Number of children: 2  . Years of education: Not on file  . Highest education level: Not on file  Occupational History  . 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  Social Needs  . Financial resource strain: Not hard at all  . Food insecurity    Worry: Never true    Inability: Never true  . Transportation needs    Medical: No    Non-medical: No  Tobacco Use  . Smoking status: Former Smoker    Packs/day: 0.90    Years: 8.00    Pack years: 7.20    Types: Cigarettes    Quit date: 01/16/1974    Years since quitting: 44.9  . Smokeless tobacco: Never Used  . Tobacco comment: smoked  1966- 1976,up to 1 ppd  Substance and Sexual Activity  . Alcohol use: Yes    Alcohol/week: 0.0 standard drinks    Comment: Socially; "sips socially"( essentially none)  . Drug use: No  . Sexual activity: Yes  Lifestyle  . Physical activity    Days per week: 0 days    Minutes per session: 0 min  . Stress: Only a little  Relationships  . Social connections    Talks on phone: More than three times a week    Gets together: More than three times a week    Attends religious service: Not on file    Active member of club or  organization: Not on file    Attends meetings of clubs or organizations: Not on file    Relationship status: Married  Other Topics Concern  . Not on file  Social History Narrative  . Not on file    Family History  Problem Relation Age of Onset  . Diabetes Father   . Stroke Father        mid 76s  . Lung cancer Mother        non smoker  . Stroke Mother 6  .  Testicular cancer Brother   . Allergies Maternal Grandmother   . Cancer Maternal Grandmother        unsure  . Allergies Brother   . Heart attack Neg Hx     Review of Systems  Constitutional: Negative for chills and fever.  Respiratory: Negative for cough, shortness of breath and wheezing.   Cardiovascular: Negative for chest pain, palpitations and leg swelling.  Neurological: Negative for light-headedness and headaches.       Objective:   Vitals:   12/05/18 0928  BP: (!) 150/82  Pulse: 74  Temp: 97.9 F (36.6 C)  SpO2: 98%   BP Readings from Last 3 Encounters:  12/05/18 (!) 150/82  09/04/18 (!) 158/84  09/04/18 (!) 158/84   Wt Readings from Last 3 Encounters:  12/05/18 187 lb 12.8 oz (85.2 kg)  09/04/18 193 lb (87.5 kg)  09/04/18 193 lb 6.4 oz (87.7 kg)   Body mass index is 28.55 kg/m.   Physical Exam    Constitutional: Appears well-developed and well-nourished. No distress.  HENT:  Head: Normocephalic and atraumatic.  Neck: Neck supple. No tracheal deviation present. No thyromegaly present.  No cervical lymphadenopathy Cardiovascular: Normal rate, regular rhythm and normal heart sounds.   No murmur heard. No carotid bruit .  No edema Pulmonary/Chest: Effort normal and breath sounds normal. No respiratory distress. No has no wheezes. No rales.  Skin: Skin is warm and dry. Not diaphoretic.  Psychiatric: Normal mood and affect. Behavior is normal.      Assessment & Plan:    This visit occurred during the SARS-CoV-2 public health emergency.  Safety protocols were in place, including screening  questions prior to the visit, additional usage of staff PPE, and extensive cleaning of exam room while observing appropriate contact time as indicated for disinfecting solutions.     See Problem List for Assessment and Plan of chronic medical problems.

## 2018-12-05 ENCOUNTER — Other Ambulatory Visit (INDEPENDENT_AMBULATORY_CARE_PROVIDER_SITE_OTHER): Payer: Medicare Other

## 2018-12-05 ENCOUNTER — Other Ambulatory Visit: Payer: Self-pay

## 2018-12-05 ENCOUNTER — Ambulatory Visit (INDEPENDENT_AMBULATORY_CARE_PROVIDER_SITE_OTHER): Payer: Medicare Other | Admitting: Internal Medicine

## 2018-12-05 ENCOUNTER — Encounter: Payer: Self-pay | Admitting: Internal Medicine

## 2018-12-05 VITALS — BP 150/82 | HR 74 | Temp 97.9°F | Ht 68.0 in | Wt 187.8 lb

## 2018-12-05 DIAGNOSIS — E1151 Type 2 diabetes mellitus with diabetic peripheral angiopathy without gangrene: Secondary | ICD-10-CM

## 2018-12-05 DIAGNOSIS — E039 Hypothyroidism, unspecified: Secondary | ICD-10-CM

## 2018-12-05 DIAGNOSIS — I10 Essential (primary) hypertension: Secondary | ICD-10-CM | POA: Diagnosis not present

## 2018-12-05 DIAGNOSIS — E7849 Other hyperlipidemia: Secondary | ICD-10-CM | POA: Diagnosis not present

## 2018-12-05 LAB — HEMOGLOBIN A1C: Hgb A1c MFr Bld: 7.2 % — ABNORMAL HIGH (ref 4.6–6.5)

## 2018-12-05 LAB — COMPREHENSIVE METABOLIC PANEL
ALT: 20 U/L (ref 0–53)
AST: 17 U/L (ref 0–37)
Albumin: 4.6 g/dL (ref 3.5–5.2)
Alkaline Phosphatase: 49 U/L (ref 39–117)
BUN: 13 mg/dL (ref 6–23)
CO2: 27 mEq/L (ref 19–32)
Calcium: 10 mg/dL (ref 8.4–10.5)
Chloride: 102 mEq/L (ref 96–112)
Creatinine, Ser: 0.92 mg/dL (ref 0.40–1.50)
GFR: 80.84 mL/min (ref >60.00)
Glucose, Bld: 174 mg/dL — ABNORMAL HIGH (ref 70–99)
Potassium: 5 mEq/L (ref 3.5–5.1)
Sodium: 140 mEq/L (ref 135–145)
Total Bilirubin: 0.7 mg/dL (ref 0.2–1.2)
Total Protein: 7.6 g/dL (ref 6.0–8.3)

## 2018-12-05 LAB — TSH: TSH: 3.16 u[IU]/mL (ref 0.35–4.50)

## 2018-12-05 LAB — MICROALBUMIN / CREATININE URINE RATIO
Creatinine,U: 54.6 mg/dL
Microalb Creat Ratio: 41.8 mg/g — ABNORMAL HIGH (ref 0.0–30.0)
Microalb, Ur: 22.8 mg/dL — ABNORMAL HIGH (ref 0.0–1.9)

## 2018-12-05 MED ORDER — JARDIANCE 10 MG PO TABS: 10.0000 mg | ORAL_TABLET | Freq: Every day | ORAL | 1 refills | Status: DC

## 2018-12-05 MED ORDER — RIVAROXABAN 20 MG PO TABS: 20.0000 mg | ORAL_TABLET | Freq: Every day | ORAL | 1 refills | Status: DC

## 2018-12-05 MED ORDER — SITAGLIPTIN PHOSPHATE 100 MG PO TABS: 100.0000 mg | ORAL_TABLET | Freq: Every day | ORAL | 1 refills | Status: DC

## 2018-12-05 NOTE — Assessment & Plan Note (Signed)
Clinically euthyroid Check tsh  Titrate med dose if needed  

## 2018-12-05 NOTE — Assessment & Plan Note (Signed)
Sugars better controlled at home He is having some side effects with Jardiance, but would like to continue it for now given that it is helping his sugars.  He will be on new insurance plan next year and we will look into making changes been Check A1c, urine microalbumin, CMP Encouraged regular exercise

## 2018-12-05 NOTE — Assessment & Plan Note (Signed)
Check lipid panel  Continue daily statin Regular exercise and healthy diet encouraged  

## 2018-12-05 NOTE — Assessment & Plan Note (Signed)
Blood pressure elevated here today, but at home he states he gets 130s/70s We will continue current medication for now  encouraged regular exercise

## 2018-12-06 ENCOUNTER — Encounter: Payer: Self-pay | Admitting: Internal Medicine

## 2018-12-27 ENCOUNTER — Other Ambulatory Visit: Payer: Self-pay | Admitting: Internal Medicine

## 2018-12-27 ENCOUNTER — Encounter: Payer: Self-pay | Admitting: Internal Medicine

## 2019-01-06 ENCOUNTER — Encounter (INDEPENDENT_AMBULATORY_CARE_PROVIDER_SITE_OTHER): Payer: Medicare Other | Admitting: Ophthalmology

## 2019-01-06 DIAGNOSIS — I10 Essential (primary) hypertension: Secondary | ICD-10-CM | POA: Diagnosis not present

## 2019-01-06 DIAGNOSIS — E11319 Type 2 diabetes mellitus with unspecified diabetic retinopathy without macular edema: Secondary | ICD-10-CM | POA: Diagnosis not present

## 2019-01-06 DIAGNOSIS — E113391 Type 2 diabetes mellitus with moderate nonproliferative diabetic retinopathy without macular edema, right eye: Secondary | ICD-10-CM | POA: Diagnosis not present

## 2019-01-06 DIAGNOSIS — H35372 Puckering of macula, left eye: Secondary | ICD-10-CM

## 2019-01-06 DIAGNOSIS — H43813 Vitreous degeneration, bilateral: Secondary | ICD-10-CM

## 2019-01-06 DIAGNOSIS — E113292 Type 2 diabetes mellitus with mild nonproliferative diabetic retinopathy without macular edema, left eye: Secondary | ICD-10-CM

## 2019-01-06 DIAGNOSIS — H35033 Hypertensive retinopathy, bilateral: Secondary | ICD-10-CM

## 2019-01-06 LAB — HM DIABETES EYE EXAM

## 2019-01-07 ENCOUNTER — Encounter: Payer: Self-pay | Admitting: Internal Medicine

## 2019-01-13 ENCOUNTER — Telehealth: Payer: Medicare Other | Admitting: Internal Medicine

## 2019-01-20 ENCOUNTER — Encounter: Payer: Self-pay | Admitting: Internal Medicine

## 2019-01-20 ENCOUNTER — Telehealth (INDEPENDENT_AMBULATORY_CARE_PROVIDER_SITE_OTHER): Payer: Medicare Other | Admitting: Internal Medicine

## 2019-01-20 VITALS — BP 148/73 | HR 72 | Ht 68.0 in | Wt 185.0 lb

## 2019-01-20 DIAGNOSIS — I4819 Other persistent atrial fibrillation: Secondary | ICD-10-CM | POA: Diagnosis not present

## 2019-01-20 DIAGNOSIS — I10 Essential (primary) hypertension: Secondary | ICD-10-CM | POA: Diagnosis not present

## 2019-01-20 NOTE — Progress Notes (Signed)
Electrophysiology TeleHealth Note   Due to national recommendations of social distancing due to COVID 19, an audio/video telehealth visit is felt to be most appropriate for this patient at this time.  See MyChart message from today for the patient's consent to telehealth for [facility].    Date:  01/20/2019   ID:  [name], DOB June 23, 1945, MRN [id number]  Location: patient's home  Provider location:  [facility]  Evaluation Performed: Follow-up visit  PCP:  Binnie Rail, MD   Electrophysiologist:  Dr Rayann Heman  Chief Complaint:  palpitations  History of Present Illness:    [name] is a 74 y.o. male who presents via telehealth conferencing today.  Since last being seen in our clinic, the patient reports doing very well.  Today, he denies symptoms of palpitations, chest pain, shortness of breath,  lower extremity edema, dizziness, presyncope, or syncope.  The patient is otherwise without complaint today.  The patient denies symptoms of fevers, chills, cough, or new SOB worrisome for COVID 19.  Past Medical History:  Diagnosis Date  . AF (atrial fibrillation) (HCC)    persistent afib, s/p afib ablation x 2 with subsequent convergent procedure at [facility]  . Allergic rhinitis    seasonal  . Atrial flutter ([place])   . CTS (carpal tunnel syndrome)    PMH of  . Diabetes mellitus    type 2  . External hemorrhoids   . Glaucoma   . Hyperlipidemia   . Hypertension   . Hypothyroidism   . Renal calculus 2005   [facility]    Past Surgical History:  Procedure Laterality Date  . ABLATION  06/2013   atypical atrial flutter ablation by Dr Lehman Prom at [facility] (mitral annular flutter ablated)  . afib 09/22/2008--11/03/2009     afib ablation x 2 by Dr Rayann Heman with subsequent convergent procedure at [facility]  . CARDIAC CATHETERIZATION  2005   non obstructive CAD  . CARDIOVERSION    . CARDIOVERSION  12/09/2010   Procedure: CARDIOVERSION;  Surgeon: Loralie Champagne, MD;   Location: [place];  Service: Cardiovascular;  Laterality: N/A;  . CARDIOVERSION  09/15/2011   Procedure: CARDIOVERSION;  Surgeon: Josue Hector, MD;  Location: [place];  Service: Cardiovascular;  Laterality: N/A;  office drawing labs wed 8/28  . CARDIOVERSION N/A 07/18/2012   Procedure: CARDIOVERSION;  Surgeon: Thayer Headings, MD;  Location: [place];  Service: Cardiovascular;  Laterality: N/A;  . CARDIOVERSION  08/2014   [place]  . COLONOSCOPY  2009   Dr Sharlett Iles  . convergent procedure  2013   at [facility] for a fib  . JOINT REPLACEMENT Right 2008, 2012  . LOOP RECORDER IMPLANT Left 12-2010  . TONSILLECTOMY    . TOTAL HIP ARTHROPLASTY  06/2010   Depuy prosthesis replacement, Dr Alvan Dame  . TOTAL HIP ARTHROPLASTY Right 2008    Depuy;Dr Alvan Dame    Current Outpatient Medications  Medication Sig Dispense Refill  . amLODipine (NORVASC) 10 MG tablet TAKE 1 TABLET DAILY 90 tablet 0  . atorvastatin (LIPITOR) 10 MG tablet Take 1 tablet (10 mg total) by mouth daily. 90 tablet 1  . empagliflozin (JARDIANCE) 10 MG TABS tablet Take 10 mg by mouth daily. 90 tablet 1  . glimepiride (AMARYL) 4 MG tablet TAKE 1 TABLET DAILY BEFORE BREAKFAST 90 tablet 1  . latanoprost (XALATAN) 0.005 % ophthalmic solution Place 1 drop into the left eye at bedtime.    Marland Kitchen levothyroxine (SYNTHROID) 75 MCG tablet  TAKE 1 TABLET DAILY BEFORE BREAKFAST 90 tablet 0  . Magnesium Oxide 500 MG CAPS Take 1 capsule by mouth 2 (two) times daily.    . metFORMIN (GLUCOPHAGE-XR) 500 MG 24 hr tablet Take 2 tablets (1000 mg) twice daily. 360 tablet 0  . metoprolol succinate (TOPROL-XL) 100 MG 24 hr tablet TAKE 1 TABLET TWICE A DAY  AND AN EXTRA 1/2 TABLET    DAILY AS NEEDED FOR ATRIAL FIBRILLATION 225 tablet 0  . repaglinide (PRANDIN) 2 MG tablet TAKE 2 TABLETS (4 MG TOTAL)3 TIMES A DAY 15-30 MINUTESPRIOR TO MEALS 540 tablet 0  . rivaroxaban (XARELTO) 20 MG TABS tablet Take 1 tablet (20 mg total) by mouth daily with supper. 90 tablet 1    . sitaGLIPtin (JANUVIA) 100 MG tablet Take 1 tablet (100 mg total) by mouth daily. 90 tablet 1  . timolol (BETIMOL) 0.25 % ophthalmic solution 1-2 drops 2 (two) times daily.     No current facility-administered medications for this visit.    Allergies:   Farxiga [dapagliflozin]   Social History:  The patient  reports that he quit smoking about 45 years ago. His smoking use included cigarettes. He has a 7.20 pack-year smoking history. He has never used smokeless tobacco. He reports current alcohol use. He reports that he does not use drugs.   Family History:  The patient's  family history includes Allergies in his brother and maternal grandmother; Cancer in his maternal grandmother; Diabetes in his father; Lung cancer in his mother; Stroke in his father; Stroke (age of onset: 26) in his mother; Testicular cancer in his brother.   ROS:  Please see the history of present illness.   All other systems are personally reviewed and negative.    Exam:    Vital Signs:  BP (!) 148/73   Pulse 72   Ht 5\' 8"  (1.727 m)   Wt 185 lb (83.9 kg)   BMI 28.13 kg/m  repeat 140/66  Well sounding and appearing, alert and conversant, regular work of breathing,  good skin color Eyes- anicteric, neuro- grossly intact, skin- no apparent rash or lesions or cyanosis, mouth- oral mucosa is pink  Labs/Other Tests and Data Reviewed:    Recent Labs: 09/04/2018: Hemoglobin 16.1; Platelets 202.0 12/05/2018: ALT 20; BUN 13; Creatinine, Ser 0.92; Potassium 5.0; Sodium 140; TSH 3.16   Wt Readings from Last 3 Encounters:  01/20/19 185 lb (83.9 kg)  12/05/18 187 lb 12.8 oz (85.2 kg)  09/04/18 193 lb (87.5 kg)     ASSESSMENT & PLAN:    1.  Persistent atrial fibrillation/ atypical atrial flutter Doing very well off AAD therapy post ablation and convergent procedure chads2vasc score is 3.  Continue xarelto Could consider removing his old Reveal XT loop recorder, though this may have to get done at [facility].   Eventually, hopefully this could be removed in office  2. HTN Stable No change required today  Follow-up:  12 months with me   Patient Risk:  after full review of this patients clinical status, I feel that they are at moderate risk at this time.  Today, I have spent 15 minutes with the patient with telehealth technology discussing arrhythmia management .    Army Fossa, MD  01/20/2019 12:01 PM     [address] [place] [address] [phone] (office) [phone] (fax)

## 2019-01-26 ENCOUNTER — Encounter: Payer: Self-pay | Admitting: Internal Medicine

## 2019-01-27 ENCOUNTER — Other Ambulatory Visit: Payer: Self-pay

## 2019-01-27 MED ORDER — METFORMIN HCL ER 500 MG PO TB24: ORAL_TABLET | ORAL | 0 refills | Status: DC

## 2019-01-27 MED ORDER — RIVAROXABAN 20 MG PO TABS: 20.0000 mg | ORAL_TABLET | Freq: Every day | ORAL | 0 refills | Status: DC

## 2019-01-27 MED ORDER — SITAGLIPTIN PHOSPHATE 100 MG PO TABS: 100.0000 mg | ORAL_TABLET | Freq: Every day | ORAL | 0 refills | Status: DC

## 2019-01-27 MED ORDER — METOPROLOL SUCCINATE ER 100 MG PO TB24: ORAL_TABLET | ORAL | 0 refills | Status: DC

## 2019-01-27 MED ORDER — GLIMEPIRIDE 4 MG PO TABS: 4.0000 mg | ORAL_TABLET | Freq: Every day | ORAL | 0 refills | Status: DC

## 2019-01-27 MED ORDER — ATORVASTATIN CALCIUM 10 MG PO TABS: 10.0000 mg | ORAL_TABLET | Freq: Every day | ORAL | 0 refills | Status: DC

## 2019-01-27 MED ORDER — JARDIANCE 10 MG PO TABS: 10.0000 mg | ORAL_TABLET | Freq: Every day | ORAL | 0 refills | Status: DC

## 2019-01-27 MED ORDER — LEVOTHYROXINE SODIUM 75 MCG PO TABS: 75.0000 ug | ORAL_TABLET | Freq: Every day | ORAL | 0 refills | Status: DC

## 2019-01-27 MED ORDER — REPAGLINIDE 2 MG PO TABS: ORAL_TABLET | ORAL | 0 refills | Status: DC

## 2019-01-27 MED ORDER — AMLODIPINE BESYLATE 10 MG PO TABS: 10.0000 mg | ORAL_TABLET | Freq: Every day | ORAL | 0 refills | Status: DC

## 2019-01-29 ENCOUNTER — Telehealth: Payer: Self-pay | Admitting: Internal Medicine

## 2019-01-29 ENCOUNTER — Encounter: Payer: Self-pay | Admitting: Internal Medicine

## 2019-01-29 NOTE — Telephone Encounter (Signed)
Pharmacy called stating they are needing clarification on the pts glimepiride. Please advise.    [facility] [place], [place]  [address] [place] [address]  Phone: [phone] Fax: [phone]  Not a 24 hour pharmacy; exact hours not known.

## 2019-01-29 NOTE — Telephone Encounter (Signed)
Months continue glimepiride and decrease Prandin and see how he does.  Please inform patient.  Prandin discontinued.  Will evaluate at his appointment in March

## 2019-01-29 NOTE — Telephone Encounter (Signed)
Pharmacy aware. Sent response through my chart to patient.

## 2019-01-29 NOTE — Telephone Encounter (Signed)
Returned pharmacy's call. Wanted to know if patient should be taking repaglinide and glimiperide together. It looks like he has been on these medications for a while at the same time but pharmacy says they are not typically prescribed at the same time and wanted to make sure it was ok. Please advise

## 2019-02-13 ENCOUNTER — Ambulatory Visit: Payer: Medicare Other

## 2019-02-15 ENCOUNTER — Encounter: Payer: Self-pay | Admitting: Internal Medicine

## 2019-02-17 MED ORDER — GLIMEPIRIDE 2 MG PO TABS: 2.0000 mg | ORAL_TABLET | Freq: Every day | ORAL | 1 refills | Status: DC

## 2019-02-17 NOTE — Addendum Note (Signed)
Addended by: Binnie Rail on: 02/17/2019 08:51 AM   Modules accepted: Orders

## 2019-02-22 ENCOUNTER — Ambulatory Visit: Payer: Medicare Other

## 2019-02-24 DIAGNOSIS — H401111 Primary open-angle glaucoma, right eye, mild stage: Secondary | ICD-10-CM | POA: Diagnosis not present

## 2019-02-24 DIAGNOSIS — H4052X1 Glaucoma secondary to other eye disorders, left eye, mild stage: Secondary | ICD-10-CM | POA: Diagnosis not present

## 2019-03-06 ENCOUNTER — Ambulatory Visit: Payer: Medicare Other

## 2019-03-10 DIAGNOSIS — H401121 Primary open-angle glaucoma, left eye, mild stage: Secondary | ICD-10-CM | POA: Diagnosis not present

## 2019-03-10 DIAGNOSIS — H4052X1 Glaucoma secondary to other eye disorders, left eye, mild stage: Secondary | ICD-10-CM | POA: Diagnosis not present

## 2019-03-10 DIAGNOSIS — H401111 Primary open-angle glaucoma, right eye, mild stage: Secondary | ICD-10-CM | POA: Diagnosis not present

## 2019-03-24 DIAGNOSIS — H4052X1 Glaucoma secondary to other eye disorders, left eye, mild stage: Secondary | ICD-10-CM | POA: Diagnosis not present

## 2019-03-24 DIAGNOSIS — H401111 Primary open-angle glaucoma, right eye, mild stage: Secondary | ICD-10-CM | POA: Diagnosis not present

## 2019-03-24 DIAGNOSIS — H401121 Primary open-angle glaucoma, left eye, mild stage: Secondary | ICD-10-CM | POA: Diagnosis not present

## 2019-04-02 NOTE — Progress Notes (Signed)
Subjective:    Patient ID: [name], male    DOB: 06-27-1945, 74 y.o.   MRN: [id number]  HPI The patient is here for follow up of their chronic medical problems, including diabetes, hyperlipidemia, hypertension, hypothyroidism.  He is taking all of his medications as prescribed.   He is exercising irregularly.  He walks a little.  He is fairly complaint with diabetic diet.    He is compliant with a diabetic diet. His sugars have been 120-150, 7 over 150.   BP at home is usually lower than here - it tends to be higher at the doctor's  Medications and allergies reviewed with patient and updated if appropriate.  Patient Active Problem List   Diagnosis Date Noted  . Diabetes mellitus with peripheral circulatory disorder, controlled ([place]) 11/09/2016  . Lumbar radiculopathy 05/05/2015  . Atypical atrial flutter ([place]) 09/12/2014  . Obesity 06/25/2013  . Hypothyroidism 12/30/2010  . OSA (obstructive sleep apnea) 09/02/2010  . Atrial fibrillation ([place]) 04/05/2009  . ERECTILE DYSFUNCTION 05/01/2008  . Hyperlipidemia 09/06/2007  . Hypertension 09/06/2007  . BPH with obstruction/lower urinary tract symptoms 04/25/2007    Current Outpatient Medications on File Prior to Visit  Medication Sig Dispense Refill  . empagliflozin (JARDIANCE) 10 MG TABS tablet Take 10 mg by mouth daily. 90 tablet 0  . glimepiride (AMARYL) 2 MG tablet Take 1 tablet (2 mg total) by mouth daily before breakfast. Take in addition to 4mg  pill for total of 6 mg daily. 90 tablet 1  . glimepiride (AMARYL) 4 MG tablet Take 1 tablet (4 mg total) by mouth daily before breakfast. 90 tablet 0  . Latanoprostene Bunod (VYZULTA) 0.024 % SOLN Apply to eye.    . Magnesium Oxide 500 MG CAPS Take 1 capsule by mouth 2 (two) times daily.    . metFORMIN (GLUCOPHAGE-XR) 500 MG 24 hr tablet Take 2 tablets (1000 mg) twice daily. 360 tablet 0  . rivaroxaban (XARELTO) 20 MG TABS tablet Take 1 tablet (20 mg total) by mouth daily  with supper. 90 tablet 0  . sitaGLIPtin (JANUVIA) 100 MG tablet Take 1 tablet (100 mg total) by mouth daily. 90 tablet 0  . timolol (BETIMOL) 0.25 % ophthalmic solution 1-2 drops 2 (two) times daily.     No current facility-administered medications on file prior to visit.    Past Medical History:  Diagnosis Date  . AF (atrial fibrillation) (HCC)    persistent afib, s/p afib ablation x 2 with subsequent convergent procedure at [facility]  . Allergic rhinitis    seasonal  . Atrial flutter ([place])   . CTS (carpal tunnel syndrome)    PMH of  . Diabetes mellitus    type 2  . External hemorrhoids   . Glaucoma   . Hyperlipidemia   . Hypertension   . Hypothyroidism   . Renal calculus 2005   [facility]    Past Surgical History:  Procedure Laterality Date  . ABLATION  06/2013   atypical atrial flutter ablation by Dr Lehman Prom at [facility] (mitral annular flutter ablated)  . afib 09/22/2008--11/03/2009     afib ablation x 2 by Dr Rayann Heman with subsequent convergent procedure at [facility]  . CARDIAC CATHETERIZATION  2005   non obstructive CAD  . CARDIOVERSION    . CARDIOVERSION  12/09/2010   Procedure: CARDIOVERSION;  Surgeon: Loralie Champagne, MD;  Location: [place];  Service: Cardiovascular;  Laterality: N/A;  . CARDIOVERSION  09/15/2011   Procedure: CARDIOVERSION;  Surgeon: Wallis Bamberg  Johnsie Cancel, MD;  Location: [facility];  Service: Cardiovascular;  Laterality: N/A;  office drawing labs wed 8/28  . CARDIOVERSION N/A 07/18/2012   Procedure: CARDIOVERSION;  Surgeon: Thayer Headings, MD;  Location: [place];  Service: Cardiovascular;  Laterality: N/A;  . CARDIOVERSION  08/2014   [place]  . COLONOSCOPY  2009   Dr Sharlett Iles  . convergent procedure  2013   at [facility] for a fib  . JOINT REPLACEMENT Right 2008, 2012  . LOOP RECORDER IMPLANT Left 12-2010  . TONSILLECTOMY    . TOTAL HIP ARTHROPLASTY  06/2010   Depuy prosthesis replacement, Dr Alvan Dame  . TOTAL HIP ARTHROPLASTY Right 2008    Depuy;Dr Alvan Dame     Social History   Socioeconomic History  . Marital status: Married    Spouse name: Izora Gala  . Number of children: 2  . Years of education: Not on file  . Highest education level: Not on file  Occupational History  . 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  Tobacco Use  . Smoking status: Former Smoker    Packs/day: 0.90    Years: 8.00    Pack years: 7.20    Types: Cigarettes    Quit date: 01/16/1974    Years since quitting: 45.2  . Smokeless tobacco: Never Used  . Tobacco comment: smoked  1966- 1976,up to 1 ppd  Substance and Sexual Activity  . Alcohol use: Yes    Alcohol/week: 0.0 standard drinks    Comment: Socially; "sips socially"( essentially none)  . Drug use: No  . Sexual activity: Yes  Other Topics Concern  . Not on file  Social History Narrative  . Not on file   Social Determinants of Health   Financial Resource Strain:   . Difficulty of Paying Living Expenses:   Food Insecurity:   . Worried About Charity fundraiser in the Last Year:   . Arboriculturist in the Last Year:   Transportation Needs:   . Film/video editor (Medical):   Marland Kitchen Lack of Transportation (Non-Medical):   Physical Activity: Inactive  . Days of Exercise per Week: 0 days  . Minutes of Exercise per Session: 0 min  Stress:   . Feeling of Stress :   Social Connections:   . Frequency of Communication with Friends and Family:   . Frequency of Social Gatherings with Friends and Family:   . Attends Religious Services:   . Active Member of Clubs or Organizations:   . Attends Archivist Meetings:   Marland Kitchen Marital Status:     Family History  Problem Relation Age of Onset  . Diabetes Father   . Stroke Father        mid 78s  . Lung cancer Mother        non smoker  . Stroke Mother 89  . Testicular cancer Brother   . Allergies Maternal Grandmother   . Cancer Maternal Grandmother        unsure  . Allergies Brother   . Heart attack Neg Hx     Review of Systems   Constitutional: Negative for fever.  Respiratory: Negative for cough, shortness of breath and wheezing.   Cardiovascular: Negative for chest pain, palpitations and leg swelling.  Genitourinary: Negative for discharge and dysuria.  Neurological: Negative for light-headedness and headaches.       Objective:   Vitals:   04/03/19 0944  BP: (!) 146/78  Pulse: 66  Resp: 16  Temp: 98.2 F (36.8 C)  SpO2: 99%   BP Readings from Last 3 Encounters:  04/03/19 (!) 146/78  01/20/19 (!) 148/73  12/05/18 (!) 150/82   Wt Readings from Last 3 Encounters:  04/03/19 185 lb (83.9 kg)  01/20/19 185 lb (83.9 kg)  12/05/18 187 lb 12.8 oz (85.2 kg)   Body mass index is 28.13 kg/m.   Physical Exam    Constitutional: Appears well-developed and well-nourished. No distress.  HENT:  Head: Normocephalic and atraumatic.  Neck: Neck supple. No tracheal deviation present. No thyromegaly present.  No cervical lymphadenopathy Cardiovascular: Normal rate, regular rhythm and normal heart sounds.   No murmur heard. No carotid bruit .  No edema Pulmonary/Chest: Effort normal and breath sounds normal. No respiratory distress. No has no wheezes. No rales.  Skin: Skin is warm and dry. Not diaphoretic.  Psychiatric: Normal mood and affect. Behavior is normal.      Assessment & Plan:    See Problem List for Assessment and Plan of chronic medical problems.    This visit occurred during the SARS-CoV-2 public health emergency.  Safety protocols were in place, including screening questions prior to the visit, additional usage of staff PPE, and extensive cleaning of exam room while observing appropriate contact time as indicated for disinfecting solutions.

## 2019-04-02 NOTE — Patient Instructions (Addendum)
  Blood work was ordered.     Medications reviewed and updated.  Changes include :   none  Your prescription(s) have been submitted to your pharmacy. Please take as directed and contact our office if you believe you are having problem(s) with the medication(s).   Please followup in 6 months   

## 2019-04-03 ENCOUNTER — Encounter: Payer: Self-pay | Admitting: Internal Medicine

## 2019-04-03 ENCOUNTER — Other Ambulatory Visit: Payer: Self-pay

## 2019-04-03 ENCOUNTER — Ambulatory Visit (INDEPENDENT_AMBULATORY_CARE_PROVIDER_SITE_OTHER): Payer: Medicare Other | Admitting: Internal Medicine

## 2019-04-03 VITALS — BP 146/78 | HR 66 | Temp 98.2°F | Resp 16 | Ht 68.0 in | Wt 185.0 lb

## 2019-04-03 DIAGNOSIS — I10 Essential (primary) hypertension: Secondary | ICD-10-CM

## 2019-04-03 DIAGNOSIS — E1151 Type 2 diabetes mellitus with diabetic peripheral angiopathy without gangrene: Secondary | ICD-10-CM

## 2019-04-03 DIAGNOSIS — E039 Hypothyroidism, unspecified: Secondary | ICD-10-CM | POA: Diagnosis not present

## 2019-04-03 DIAGNOSIS — E7849 Other hyperlipidemia: Secondary | ICD-10-CM | POA: Diagnosis not present

## 2019-04-03 LAB — LIPID PANEL
Cholesterol: 107 mg/dL (ref 0–200)
HDL: 45.2 mg/dL (ref >39.00)
LDL Cholesterol: 38 mg/dL (ref 0–99)
NonHDL: 61.83
Total CHOL/HDL Ratio: 2
Triglycerides: 120 mg/dL (ref 0.0–149.0)
VLDL: 24 mg/dL (ref 0.0–40.0)

## 2019-04-03 LAB — COMPREHENSIVE METABOLIC PANEL
ALT: 19 U/L (ref 0–53)
AST: 17 U/L (ref 0–37)
Albumin: 4.4 g/dL (ref 3.5–5.2)
Alkaline Phosphatase: 47 U/L (ref 39–117)
BUN: 19 mg/dL (ref 6–23)
CO2: 29 mEq/L (ref 19–32)
Calcium: 10.1 mg/dL (ref 8.4–10.5)
Chloride: 103 mEq/L (ref 96–112)
Creatinine, Ser: 1.01 mg/dL (ref 0.40–1.50)
GFR: 72.52 mL/min (ref >60.00)
Glucose, Bld: 173 mg/dL — ABNORMAL HIGH (ref 70–99)
Potassium: 5 mEq/L (ref 3.5–5.1)
Sodium: 138 mEq/L (ref 135–145)
Total Bilirubin: 0.6 mg/dL (ref 0.2–1.2)
Total Protein: 7.5 g/dL (ref 6.0–8.3)

## 2019-04-03 LAB — TSH: TSH: 3.13 u[IU]/mL (ref 0.35–4.50)

## 2019-04-03 LAB — HEMOGLOBIN A1C: Hgb A1c MFr Bld: 7.4 % — ABNORMAL HIGH (ref 4.6–6.5)

## 2019-04-03 MED ORDER — ATORVASTATIN CALCIUM 10 MG PO TABS: 10.0000 mg | ORAL_TABLET | Freq: Every day | ORAL | 0 refills | Status: DC

## 2019-04-03 MED ORDER — LEVOTHYROXINE SODIUM 75 MCG PO TABS: 75.0000 ug | ORAL_TABLET | Freq: Every day | ORAL | 0 refills | Status: DC

## 2019-04-03 MED ORDER — AMLODIPINE BESYLATE 10 MG PO TABS: 10.0000 mg | ORAL_TABLET | Freq: Every day | ORAL | 0 refills | Status: DC

## 2019-04-03 MED ORDER — METOPROLOL SUCCINATE ER 100 MG PO TB24: ORAL_TABLET | ORAL | 0 refills | Status: DC

## 2019-04-03 NOTE — Assessment & Plan Note (Signed)
Chronic  Clinically euthyroid Check tsh  Titrate med dose if needed  

## 2019-04-03 NOTE — Assessment & Plan Note (Signed)
Chronic Blood pressure elevated here today, but better controlled at home He will continue to monitor at home Continue current medications at current doses CMP

## 2019-04-03 NOTE — Assessment & Plan Note (Signed)
Chronic Sugars sound like they are well controlled at home He is taking his medication as prescribed He is fairly compliant with a diabetic diet, but could do better He is exercising regularly Stressed the importance of regular exercise and weight loss to help control his sugars and avoid more medication, especially insulin Check A1c We will adjust medication if necessary

## 2019-04-03 NOTE — Assessment & Plan Note (Signed)
Chronic Check lipid panel  Continue daily statin Regular exercise and healthy diet encouraged  

## 2019-04-07 ENCOUNTER — Other Ambulatory Visit: Payer: Self-pay

## 2019-04-07 ENCOUNTER — Encounter: Payer: Self-pay | Admitting: Internal Medicine

## 2019-04-07 MED ORDER — METFORMIN HCL ER 500 MG PO TB24: ORAL_TABLET | ORAL | 1 refills | Status: DC

## 2019-04-07 MED ORDER — RIVAROXABAN 20 MG PO TABS: 20.0000 mg | ORAL_TABLET | Freq: Every day | ORAL | 1 refills | Status: DC

## 2019-04-07 MED ORDER — GLIMEPIRIDE 4 MG PO TABS: 4.0000 mg | ORAL_TABLET | Freq: Every day | ORAL | 1 refills | Status: DC

## 2019-04-07 MED ORDER — SITAGLIPTIN PHOSPHATE 100 MG PO TABS: 100.0000 mg | ORAL_TABLET | Freq: Every day | ORAL | 1 refills | Status: DC

## 2019-04-07 MED ORDER — JARDIANCE 10 MG PO TABS: 10.0000 mg | ORAL_TABLET | Freq: Every day | ORAL | 1 refills | Status: DC

## 2019-05-28 ENCOUNTER — Other Ambulatory Visit: Payer: Self-pay | Admitting: Internal Medicine

## 2019-05-28 MED ORDER — LEVOTHYROXINE SODIUM 75 MCG PO TABS: 75.0000 ug | ORAL_TABLET | Freq: Every day | ORAL | 1 refills | Status: DC

## 2019-05-28 MED ORDER — METOPROLOL SUCCINATE ER 100 MG PO TB24: ORAL_TABLET | ORAL | 1 refills | Status: DC

## 2019-05-28 MED ORDER — ATORVASTATIN CALCIUM 10 MG PO TABS: 10.0000 mg | ORAL_TABLET | Freq: Every day | ORAL | 1 refills | Status: DC

## 2019-05-28 MED ORDER — AMLODIPINE BESYLATE 10 MG PO TABS: 10.0000 mg | ORAL_TABLET | Freq: Every day | ORAL | 1 refills | Status: DC

## 2019-05-31 ENCOUNTER — Other Ambulatory Visit: Payer: Self-pay | Admitting: Internal Medicine

## 2019-06-03 ENCOUNTER — Encounter: Payer: Self-pay | Admitting: Internal Medicine

## 2019-06-03 ENCOUNTER — Ambulatory Visit (INDEPENDENT_AMBULATORY_CARE_PROVIDER_SITE_OTHER): Payer: Medicare Other | Admitting: Internal Medicine

## 2019-06-03 ENCOUNTER — Other Ambulatory Visit: Payer: Self-pay

## 2019-06-03 DIAGNOSIS — H6981 Other specified disorders of Eustachian tube, right ear: Secondary | ICD-10-CM | POA: Diagnosis not present

## 2019-06-03 DIAGNOSIS — H6991 Unspecified Eustachian tube disorder, right ear: Secondary | ICD-10-CM | POA: Insufficient documentation

## 2019-06-03 NOTE — Patient Instructions (Addendum)
Your have eustachian tube dysfunction.  Take an oral anti-histamine daily (claritin or zyrtec) and use flonase daily.  If this does not help let me know and we can consider steroids.    Eustachian Tube Dysfunction  Eustachian tube dysfunction refers to a condition in which a blockage develops in the narrow passage that connects the middle ear to the back of the nose (eustachian tube). The eustachian tube regulates air pressure in the middle ear by letting air move between the ear and nose. It also helps to drain fluid from the middle ear space. Eustachian tube dysfunction can affect one or both ears. When the eustachian tube does not function properly, air pressure, fluid, or both can build up in the middle ear. What are the causes? This condition occurs when the eustachian tube becomes blocked or cannot open normally. Common causes of this condition include:  Ear infections.  Colds and other infections that affect the nose, mouth, and throat (upper respiratory tract).  Allergies.  Irritation from cigarette smoke.  Irritation from stomach acid coming up into the esophagus (gastroesophageal reflux). The esophagus is the tube that carries food from the mouth to the stomach.  Sudden changes in air pressure, such as from descending in an airplane or scuba diving.  Abnormal growths in the nose or throat, such as: ? Growths that line the nose (nasal polyps). ? Abnormal growth of cells (tumors). ? Enlarged tissue at the back of the throat (adenoids). What increases the risk? You are more likely to develop this condition if:  You smoke.  You are overweight.  You are a child who has: ? Certain birth defects of the mouth, such as cleft palate. ? Large tonsils or adenoids. What are the signs or symptoms? Common symptoms of this condition include:  A feeling of fullness in the ear.  Ear pain.  Clicking or popping noises in the ear.  Ringing in the ear.  Hearing loss.  Loss of  balance.  Dizziness. Symptoms may get worse when the air pressure around you changes, such as when you travel to an area of high elevation, fly on an airplane, or go scuba diving. How is this diagnosed? This condition may be diagnosed based on:  Your symptoms.  A physical exam of your ears, nose, and throat.  Tests, such as those that measure: ? The movement of your eardrum (tympanogram). ? Your hearing (audiometry). How is this treated? Treatment depends on the cause and severity of your condition.  In mild cases, you may relieve your symptoms by moving air into your ears. This is called "popping the ears."  In more severe cases, or if you have symptoms of fluid in your ears, treatment may include: ? Medicines to relieve congestion (decongestants). ? Medicines that treat allergies (antihistamines). ? Nasal sprays or ear drops that contain medicines that reduce swelling (steroids). ? A procedure to drain the fluid in your eardrum (myringotomy). In this procedure, a small tube is placed in the eardrum to:  Drain the fluid.  Restore the air in the middle ear space. ? A procedure to insert a balloon device through the nose to inflate the opening of the eustachian tube (balloon dilation). Follow these instructions at home: Lifestyle  Do not do any of the following until your health care provider approves: ? Travel to high altitudes. ? Fly in airplanes. ? Work in a Pension scheme manager or room. ? Scuba dive.  Do not use any products that contain nicotine or tobacco, such as  cigarettes and e-cigarettes. If you need help quitting, ask your health care provider.  Keep your ears dry. Wear fitted earplugs during showering and bathing. Dry your ears completely after. General instructions  Take over-the-counter and prescription medicines only as told by your health care provider.  Use techniques to help pop your ears as recommended by your health care provider. These may  include: ? Chewing gum. ? Yawning. ? Frequent, forceful swallowing. ? Closing your mouth, holding your nose closed, and gently blowing as if you are trying to blow air out of your nose.  Keep all follow-up visits as told by your health care provider. This is important. Contact a health care provider if:  Your symptoms do not go away after treatment.  Your symptoms come back after treatment.  You are unable to pop your ears.  You have: ? A fever. ? Pain in your ear. ? Pain in your head or neck. ? Fluid draining from your ear.  Your hearing suddenly changes.  You become very dizzy.  You lose your balance. Summary  Eustachian tube dysfunction refers to a condition in which a blockage develops in the eustachian tube.  It can be caused by ear infections, allergies, inhaled irritants, or abnormal growths in the nose or throat.  Symptoms include ear pain, hearing loss, or ringing in the ears.  Mild cases are treated with maneuvers to unblock the ears, such as yawning or ear popping.  Severe cases are treated with medicines. Surgery may also be done (rare). This information is not intended to replace advice given to you by your health care provider. Make sure you discuss any questions you have with your health care provider. Document Revised: 04/24/2017 Document Reviewed: 04/24/2017 Elsevier Patient Education  [place].

## 2019-06-03 NOTE — Assessment & Plan Note (Signed)
Acute Symptoms consistent with eustachian tube dysfunction-exam is normal Advised taking an oral antihistamine such as Claritin or Zyrtec daily and using Flonase on a daily basis If there is no improvement in his symptoms we can consider oral steroids He will let me know after a week or so

## 2019-06-03 NOTE — Progress Notes (Signed)
Subjective:    Patient ID: [name], male    DOB: [birth year], 74 y.o.   MRN: [id number]  HPI The patient is here for an acute visit.  Last week he felt like he had water in his right ear, but eventually that went away.  Then the feeling came back.  He would hear a crackling-like sound in the right ear.  He denied any discharge, ear pain or change in hearing.  There is no ringing in his ear.  He denies any fevers or chills.  He did try putting alcohol in his ear, but that did not seem to help.  He is going away on vacation and wanted to make sure the ear was okay.  He denies other cold symptoms, headaches or dizziness.  At times he does feel a popping or pressure in the ear.   Medications and allergies reviewed with patient and updated if appropriate.  Patient Active Problem List   Diagnosis Date Noted  . Diabetes mellitus with peripheral circulatory disorder, controlled ([place]) 11/09/2016  . Lumbar radiculopathy 05/05/2015  . Atypical atrial flutter ([place]) 09/12/2014  . Obesity 06/25/2013  . Hypothyroidism 12/30/2010  . OSA (obstructive sleep apnea) 09/02/2010  . Atrial fibrillation ([place]) 04/05/2009  . ERECTILE DYSFUNCTION 05/01/2008  . Hyperlipidemia 09/06/2007  . Hypertension 09/06/2007  . BPH with obstruction/lower urinary tract symptoms 04/25/2007    Current Outpatient Medications on File Prior to Visit  Medication Sig Dispense Refill  . amLODipine (NORVASC) 10 MG tablet TAKE 1 TABLET DAILY 90 tablet 1  . atorvastatin (LIPITOR) 10 MG tablet TAKE 1 TABLET DAILY 90 tablet 1  . brimonidine (ALPHAGAN) 0.2 % ophthalmic solution 1 drop 2 (two) times daily.    . empagliflozin (JARDIANCE) 10 MG TABS tablet Take 10 mg by mouth daily. 90 tablet 1  . glimepiride (AMARYL) 2 MG tablet Take 1 tablet (2 mg total) by mouth daily before breakfast. Take in addition to 4mg  pill for total of 6 mg daily. 90 tablet 1  . glimepiride (AMARYL) 4 MG tablet Take 1 tablet (4 mg total) by mouth  daily before breakfast. 90 tablet 1  . Latanoprostene Bunod (VYZULTA) 0.024 % SOLN Apply to eye.    . levothyroxine (SYNTHROID) 75 MCG tablet TAKE 1 TABLET DAILY BEFORE BREAKFAST 90 tablet 1  . Magnesium Oxide 500 MG CAPS Take 1 capsule by mouth 2 (two) times daily.    . metFORMIN (GLUCOPHAGE-XR) 500 MG 24 hr tablet Take 2 tablets (1000 mg) twice daily. 360 tablet 1  . metoprolol succinate (TOPROL-XL) 100 MG 24 hr tablet TAKE 1 TABLET TWICE A DAY AND AN EXTRA ONE-HALF (1/2) TABLET DAILY AS NEEDED FOR ATRIAL FIBRILLATION 225 tablet 1  . rivaroxaban (XARELTO) 20 MG TABS tablet Take 1 tablet (20 mg total) by mouth daily with supper. 90 tablet 1  . sitaGLIPtin (JANUVIA) 100 MG tablet Take 1 tablet (100 mg total) by mouth daily. 90 tablet 1  . timolol (BETIMOL) 0.25 % ophthalmic solution 1-2 drops 2 (two) times daily.     No current facility-administered medications on file prior to visit.    Past Medical History:  Diagnosis Date  . AF (atrial fibrillation) (HCC)    persistent afib, s/p afib ablation x 2 with subsequent convergent procedure at [facility]  . Allergic rhinitis    seasonal  . Atrial flutter ([place])   . CTS (carpal tunnel syndrome)    PMH of  . Diabetes mellitus    type 2  .  External hemorrhoids   . Glaucoma   . Hyperlipidemia   . Hypertension   . Hypothyroidism   . Renal calculus 2005   [facility]    Past Surgical History:  Procedure Laterality Date  . ABLATION  06/2013   atypical atrial flutter ablation by Dr Lehman Prom at [facility] (mitral annular flutter ablated)  . afib 09/22/2008--11/03/2009     afib ablation x 2 by Dr Rayann Heman with subsequent convergent procedure at [facility]  . CARDIAC CATHETERIZATION  2005   non obstructive CAD  . CARDIOVERSION    . CARDIOVERSION  12/09/2010   Procedure: CARDIOVERSION;  Surgeon: Loralie Champagne, MD;  Location: [place];  Service: Cardiovascular;  Laterality: N/A;  . CARDIOVERSION  09/15/2011   Procedure: CARDIOVERSION;  Surgeon: Josue Hector, MD;   Location: [place];  Service: Cardiovascular;  Laterality: N/A;  office drawing labs wed 8/28  . CARDIOVERSION N/A 07/18/2012   Procedure: CARDIOVERSION;  Surgeon: Thayer Headings, MD;  Location: [place];  Service: Cardiovascular;  Laterality: N/A;  . CARDIOVERSION  08/2014   [place]  . COLONOSCOPY  2009   Dr Sharlett Iles  . convergent procedure  2013   at [facility] for a fib  . JOINT REPLACEMENT Right 2008, 2012  . LOOP RECORDER IMPLANT Left 12-2010  . TONSILLECTOMY    . TOTAL HIP ARTHROPLASTY  06/2010   Depuy prosthesis replacement, Dr Alvan Dame  . TOTAL HIP ARTHROPLASTY Right 2008    Depuy;Dr Alvan Dame    Social History   Socioeconomic History  . Marital status: Married    Spouse name: Izora Gala  . Number of children: 2  . Years of education: Not on file  . Highest education level: Not on file  Occupational History  . 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  Tobacco Use  . Smoking status: Former Smoker    Packs/day: 0.90    Years: 8.00    Pack years: 7.20    Types: Cigarettes    Quit date: 01/16/1974    Years since quitting: 45.4  . Smokeless tobacco: Never Used  . Tobacco comment: smoked  1966- 1976,up to 1 ppd  Substance and Sexual Activity  . Alcohol use: Yes    Alcohol/week: 0.0 standard drinks    Comment: Socially; "sips socially"( essentially none)  . Drug use: No  . Sexual activity: Yes  Other Topics Concern  . Not on file  Social History Narrative  . Not on file   Social Determinants of Health   Financial Resource Strain:   . Difficulty of Paying Living Expenses:   Food Insecurity:   . Worried About Charity fundraiser in the Last Year:   . Arboriculturist in the Last Year:   Transportation Needs:   . Film/video editor (Medical):   Marland Kitchen Lack of Transportation (Non-Medical):   Physical Activity: Inactive  . Days of Exercise per Week: 0 days  . Minutes of Exercise per Session: 0 min  Stress:   . Feeling of Stress :   Social Connections:     . Frequency of Communication with Friends and Family:   . Frequency of Social Gatherings with Friends and Family:   . Attends Religious Services:   . Active Member of Clubs or Organizations:   . Attends Archivist Meetings:   Marland Kitchen Marital Status:     Family History  Problem Relation Age of Onset  . Diabetes Father   . Stroke Father  mid 42s  . Lung cancer Mother        non smoker  . Stroke Mother 15  . Testicular cancer Brother   . Allergies Maternal Grandmother   . Cancer Maternal Grandmother        unsure  . Allergies Brother   . Heart attack Neg Hx     Review of Systems  Constitutional: Negative for fever.  HENT: Negative for congestion, ear discharge, ear pain, hearing loss, sinus pain, sore throat and tinnitus.   Neurological: Negative for dizziness and headaches.       Objective:   Vitals:   06/03/19 1425  BP: (!) 148/74  Pulse: 68  Resp: 16  Temp: 98.1 F (36.7 C)  SpO2: 98%   BP Readings from Last 3 Encounters:  06/03/19 (!) 148/74  04/03/19 (!) 146/78  01/20/19 (!) 148/73   Wt Readings from Last 3 Encounters:  06/03/19 188 lb (85.3 kg)  04/03/19 185 lb (83.9 kg)  01/20/19 185 lb (83.9 kg)   Body mass index is 28.59 kg/m.   Physical Exam Constitutional:      General: He is not in acute distress.    Appearance: Normal appearance. He is not ill-appearing.  HENT:     Head: Normocephalic and atraumatic.     Right Ear: Tympanic membrane, ear canal and external ear normal. There is no impacted cerumen.     Left Ear: Tympanic membrane, ear canal and external ear normal. There is no impacted cerumen.     Mouth/Throat:     Mouth: Mucous membranes are moist.     Pharynx: No oropharyngeal exudate or posterior oropharyngeal erythema.  Musculoskeletal:     Cervical back: Neck supple. No tenderness.  Lymphadenopathy:     Cervical: No cervical adenopathy.  Skin:    General: Skin is warm and dry.  Neurological:     Mental Status: He is  alert.            Assessment & Plan:    See Problem List for Assessment and Plan of chronic medical problems.    This visit occurred during the SARS-CoV-2 public health emergency.  Safety protocols were in place, including screening questions prior to the visit, additional usage of staff PPE, and extensive cleaning of exam room while observing appropriate contact time as indicated for disinfecting solutions.

## 2019-06-09 ENCOUNTER — Encounter: Payer: Self-pay | Admitting: Internal Medicine

## 2019-06-10 MED ORDER — METHYLPREDNISOLONE 4 MG PO TBPK
ORAL_TABLET | ORAL | 0 refills | Status: DC
Start: 2019-06-10 — End: 2019-09-25

## 2019-07-20 ENCOUNTER — Encounter: Payer: Self-pay | Admitting: Internal Medicine

## 2019-07-22 ENCOUNTER — Other Ambulatory Visit: Payer: Self-pay

## 2019-07-23 ENCOUNTER — Other Ambulatory Visit: Payer: Self-pay

## 2019-07-23 MED ORDER — GLIMEPIRIDE 2 MG PO TABS: 2.0000 mg | ORAL_TABLET | Freq: Every day | ORAL | 1 refills | Status: DC

## 2019-08-10 DIAGNOSIS — Z20822 Contact with and (suspected) exposure to covid-19: Secondary | ICD-10-CM | POA: Diagnosis not present

## 2019-08-22 DIAGNOSIS — D225 Melanocytic nevi of trunk: Secondary | ICD-10-CM | POA: Diagnosis not present

## 2019-08-22 DIAGNOSIS — L648 Other androgenic alopecia: Secondary | ICD-10-CM | POA: Diagnosis not present

## 2019-08-22 DIAGNOSIS — L02421 Furuncle of right axilla: Secondary | ICD-10-CM | POA: Diagnosis not present

## 2019-08-22 DIAGNOSIS — D485 Neoplasm of uncertain behavior of skin: Secondary | ICD-10-CM | POA: Diagnosis not present

## 2019-08-22 DIAGNOSIS — R239 Unspecified skin changes: Secondary | ICD-10-CM | POA: Diagnosis not present

## 2019-08-22 DIAGNOSIS — B9689 Other specified bacterial agents as the cause of diseases classified elsewhere: Secondary | ICD-10-CM | POA: Diagnosis not present

## 2019-08-29 DIAGNOSIS — D485 Neoplasm of uncertain behavior of skin: Secondary | ICD-10-CM | POA: Diagnosis not present

## 2019-09-01 ENCOUNTER — Encounter: Payer: Self-pay | Admitting: Internal Medicine

## 2019-09-01 MED ORDER — RIVAROXABAN 20 MG PO TABS: 20.0000 mg | ORAL_TABLET | Freq: Every day | ORAL | 1 refills | Status: DC

## 2019-09-25 DIAGNOSIS — Z03818 Encounter for observation for suspected exposure to other biological agents ruled out: Secondary | ICD-10-CM | POA: Diagnosis not present

## 2019-09-25 DIAGNOSIS — Z20822 Contact with and (suspected) exposure to covid-19: Secondary | ICD-10-CM | POA: Diagnosis not present

## 2019-09-25 DIAGNOSIS — Z23 Encounter for immunization: Secondary | ICD-10-CM | POA: Diagnosis not present

## 2019-09-25 NOTE — Patient Instructions (Addendum)
  Blood work was ordered.  An xray was ordered   Medications reviewed and updated.  Changes include :   None.   Think about starting Rybelsus.    Your prescription(s) have been submitted to your pharmacy. Please take as directed and contact our office if you believe you are having problem(s) with the medication(s).     Please followup in 6 months

## 2019-09-25 NOTE — Progress Notes (Signed)
Subjective:    Patient ID: [name], male    DOB: 1945/03/14, 74 y.o.   MRN: [id number]  HPI The patient is here for follow up of their chronic medical problems, including DM, htn, hypothyroidism, hyperlipidemia  He is taking all of his medications as prescribed.    He is not exercising regularly.   He is eating well.  Sugars have been good at home - 120-130's.     Yesterday he started having LLQ pain.  In certain positions he does not feel it.  If he pushes on it he feels it and it he twists he feels it.  No radiation.  He took a laxative last night - no change.  The pain is 3-4 / 10 and intermittent.   He denies changes in urination.  He has had a stone in the past.    Going on vacation tomorrow for two weeks.     Medications and allergies reviewed with patient and updated if appropriate.  Patient Active Problem List   Diagnosis Date Noted  . Eustachian tube dysfunction, right 06/03/2019  . Diabetes mellitus with peripheral circulatory disorder, controlled ([place]) 11/09/2016  . Lumbar radiculopathy 05/05/2015  . Atypical atrial flutter ([place]) 09/12/2014  . Obesity 06/25/2013  . Hypothyroidism 12/30/2010  . OSA (obstructive sleep apnea) 09/02/2010  . Atrial fibrillation ([place]) 04/05/2009  . ERECTILE DYSFUNCTION 05/01/2008  . Hyperlipidemia 09/06/2007  . Hypertension 09/06/2007  . BPH with obstruction/lower urinary tract symptoms 04/25/2007    Current Outpatient Medications on File Prior to Visit  Medication Sig Dispense Refill  . amLODipine (NORVASC) 10 MG tablet TAKE 1 TABLET DAILY 90 tablet 1  . atorvastatin (LIPITOR) 10 MG tablet TAKE 1 TABLET DAILY 90 tablet 1  . brimonidine (ALPHAGAN) 0.2 % ophthalmic solution 1 drop 2 (two) times daily.    . empagliflozin (JARDIANCE) 10 MG TABS tablet Take 10 mg by mouth daily. 90 tablet 1  . glimepiride (AMARYL) 2 MG tablet Take 1 tablet (2 mg total) by mouth daily before breakfast. Take in addition to 4mg  pill for total  of 6 mg daily. 90 tablet 1  . glimepiride (AMARYL) 4 MG tablet Take 1 tablet (4 mg total) by mouth daily before breakfast. 90 tablet 1  . Latanoprostene Bunod (VYZULTA) 0.024 % SOLN Apply to eye.    . levothyroxine (SYNTHROID) 75 MCG tablet TAKE 1 TABLET DAILY BEFORE BREAKFAST 90 tablet 1  . Magnesium Oxide 500 MG CAPS Take 1 capsule by mouth 2 (two) times daily.    . metFORMIN (GLUCOPHAGE-XR) 500 MG 24 hr tablet Take 2 tablets (1000 mg) twice daily. 360 tablet 1  . metoprolol succinate (TOPROL-XL) 100 MG 24 hr tablet TAKE 1 TABLET TWICE A DAY AND AN EXTRA ONE-HALF (1/2) TABLET DAILY AS NEEDED FOR ATRIAL FIBRILLATION 225 tablet 1  . rivaroxaban (XARELTO) 20 MG TABS tablet Take 1 tablet (20 mg total) by mouth daily with supper. 90 tablet 1  . sitaGLIPtin (JANUVIA) 100 MG tablet Take 1 tablet (100 mg total) by mouth daily. 90 tablet 1  . timolol (BETIMOL) 0.25 % ophthalmic solution 1-2 drops 2 (two) times daily.     No current facility-administered medications on file prior to visit.    Past Medical History:  Diagnosis Date  . AF (atrial fibrillation) (HCC)    persistent afib, s/p afib ablation x 2 with subsequent convergent procedure at [facility]  . Allergic rhinitis    seasonal  . Atrial flutter ([place])   . CTS (  carpal tunnel syndrome)    PMH of  . Diabetes mellitus    type 2  . External hemorrhoids   . Glaucoma   . Hyperlipidemia   . Hypertension   . Hypothyroidism   . Renal calculus 2005   [facility]    Past Surgical History:  Procedure Laterality Date  . ABLATION  06/2013   atypical atrial flutter ablation by Dr Lehman Prom at [facility] (mitral annular flutter ablated)  . afib 09/22/2008--11/03/2009     afib ablation x 2 by Dr Rayann Heman with subsequent convergent procedure at [facility]  . CARDIAC CATHETERIZATION  2005   non obstructive CAD  . CARDIOVERSION    . CARDIOVERSION  12/09/2010   Procedure: CARDIOVERSION;  Surgeon: Loralie Champagne, MD;  Location: [place];  Service: Cardiovascular;   Laterality: N/A;  . CARDIOVERSION  09/15/2011   Procedure: CARDIOVERSION;  Surgeon: Josue Hector, MD;  Location: [place];  Service: Cardiovascular;  Laterality: N/A;  office drawing labs wed 8/28  . CARDIOVERSION N/A 07/18/2012   Procedure: CARDIOVERSION;  Surgeon: Thayer Headings, MD;  Location: [place];  Service: Cardiovascular;  Laterality: N/A;  . CARDIOVERSION  08/2014   [place]  . COLONOSCOPY  2009   Dr Sharlett Iles  . convergent procedure  2013   at [facility] for a fib  . JOINT REPLACEMENT Right 2008, 2012  . LOOP RECORDER IMPLANT Left 12-2010  . TONSILLECTOMY    . TOTAL HIP ARTHROPLASTY  06/2010   Depuy prosthesis replacement, Dr Alvan Dame  . TOTAL HIP ARTHROPLASTY Right 2008    Depuy;Dr Alvan Dame    Social History   Socioeconomic History  . Marital status: Married    Spouse name: Izora Gala  . Number of children: 2  . Years of education: Not on file  . Highest education level: Not on file  Occupational History  . 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  Tobacco Use  . Smoking status: Former Smoker    Packs/day: 0.90    Years: 8.00    Pack years: 7.20    Types: Cigarettes    Quit date: 01/16/1974    Years since quitting: 45.7  . Smokeless tobacco: Never Used  . Tobacco comment: smoked  1966- 1976,up to 1 ppd  Vaping Use  . Vaping Use: Never used  Substance and Sexual Activity  . Alcohol use: Yes    Alcohol/week: 0.0 standard drinks    Comment: Socially; "sips socially"( essentially none)  . Drug use: No  . Sexual activity: Yes  Other Topics Concern  . Not on file  Social History Narrative  . Not on file   Social Determinants of Health   Financial Resource Strain:   . Difficulty of Paying Living Expenses: Not on file  Food Insecurity:   . Worried About Charity fundraiser in the Last Year: Not on file  . Ran Out of Food in the Last Year: Not on file  Transportation Needs:   . Lack of Transportation (Medical): Not on file  . Lack of  Transportation (Non-Medical): Not on file  Physical Activity:   . Days of Exercise per Week: Not on file  . Minutes of Exercise per Session: Not on file  Stress:   . Feeling of Stress : Not on file  Social Connections:   . Frequency of Communication with Friends and Family: Not on file  . Frequency of Social Gatherings with Friends and Family: Not on file  . Attends Religious Services: Not  on file  . Active Member of Clubs or Organizations: Not on file  . Attends Archivist Meetings: Not on file  . Marital Status: Not on file    Family History  Problem Relation Age of Onset  . Diabetes Father   . Stroke Father        mid 48s  . Lung cancer Mother        non smoker  . Stroke Mother 16  . Testicular cancer Brother   . Allergies Maternal Grandmother   . Cancer Maternal Grandmother        unsure  . Allergies Brother   . Heart attack Neg Hx     Review of Systems  Constitutional: Negative for chills and fever.  Respiratory: Negative for cough, shortness of breath and wheezing.   Cardiovascular: Negative for chest pain, palpitations and leg swelling (mild this past week).  Gastrointestinal: Positive for abdominal pain. Negative for blood in stool, constipation, diarrhea and nausea.  Genitourinary: Negative for dysuria, frequency and hematuria.  Musculoskeletal: Positive for back pain (not new).  Neurological: Negative for light-headedness and headaches.       Objective:   Vitals:   09/26/19 1023  BP: 134/72  Pulse: 67  Temp: 98.3 F (36.8 C)  SpO2: 97%   BP Readings from Last 3 Encounters:  09/26/19 134/72  06/03/19 (!) 148/74  04/03/19 (!) 146/78   Wt Readings from Last 3 Encounters:  09/26/19 183 lb 9.6 oz (83.3 kg)  06/03/19 188 lb (85.3 kg)  04/03/19 185 lb (83.9 kg)   Body mass index is 27.92 kg/m.   Physical Exam    Constitutional: Appears well-developed and well-nourished. No distress.  HENT:  Head: Normocephalic and atraumatic.  Neck:  Neck supple. No tracheal deviation present. No thyromegaly present.  No cervical lymphadenopathy Cardiovascular: Normal rate, regular rhythm and normal heart sounds.   No murmur heard. No carotid bruit .  No edema Pulmonary/Chest: Effort normal and breath sounds normal. No respiratory distress. No has no wheezes. No rales.  Abdomen: soft, minimal tenderness in LLQ w/o rebound or guarding, no other tenderness, ND, no CVA tenderness Skin: Skin is warm and dry. Not diaphoretic.  Psychiatric: Normal mood and affect. Behavior is normal.      Assessment & Plan:    See Problem List for Assessment and Plan of chronic medical problems.    This visit occurred during the SARS-CoV-2 public health emergency.  Safety protocols were in place, including screening questions prior to the visit, additional usage of staff PPE, and extensive cleaning of exam room while observing appropriate contact time as indicated for disinfecting solutions.

## 2019-09-26 ENCOUNTER — Other Ambulatory Visit: Payer: Self-pay

## 2019-09-26 ENCOUNTER — Encounter: Payer: Self-pay | Admitting: Internal Medicine

## 2019-09-26 ENCOUNTER — Ambulatory Visit (INDEPENDENT_AMBULATORY_CARE_PROVIDER_SITE_OTHER): Payer: Medicare Other

## 2019-09-26 ENCOUNTER — Ambulatory Visit (INDEPENDENT_AMBULATORY_CARE_PROVIDER_SITE_OTHER): Payer: Medicare Other | Admitting: Internal Medicine

## 2019-09-26 VITALS — BP 134/72 | HR 67 | Temp 98.3°F | Wt 183.6 lb

## 2019-09-26 DIAGNOSIS — E1151 Type 2 diabetes mellitus with diabetic peripheral angiopathy without gangrene: Secondary | ICD-10-CM | POA: Diagnosis not present

## 2019-09-26 DIAGNOSIS — R1032 Left lower quadrant pain: Secondary | ICD-10-CM

## 2019-09-26 DIAGNOSIS — I4891 Unspecified atrial fibrillation: Secondary | ICD-10-CM

## 2019-09-26 DIAGNOSIS — E7849 Other hyperlipidemia: Secondary | ICD-10-CM

## 2019-09-26 DIAGNOSIS — R109 Unspecified abdominal pain: Secondary | ICD-10-CM

## 2019-09-26 DIAGNOSIS — I10 Essential (primary) hypertension: Secondary | ICD-10-CM

## 2019-09-26 DIAGNOSIS — N200 Calculus of kidney: Secondary | ICD-10-CM | POA: Diagnosis not present

## 2019-09-26 DIAGNOSIS — E039 Hypothyroidism, unspecified: Secondary | ICD-10-CM | POA: Diagnosis not present

## 2019-09-26 MED ORDER — EMPAGLIFLOZIN 10 MG PO TABS: 10.0000 mg | ORAL_TABLET | Freq: Every day | ORAL | 1 refills | Status: DC

## 2019-09-26 MED ORDER — GLIMEPIRIDE 4 MG PO TABS: 4.0000 mg | ORAL_TABLET | Freq: Every day | ORAL | 1 refills | Status: DC

## 2019-09-26 MED ORDER — SITAGLIPTIN PHOSPHATE 100 MG PO TABS
100.0000 mg | ORAL_TABLET | Freq: Every day | ORAL | 1 refills | Status: DC
Start: 2019-09-26 — End: 2019-10-28

## 2019-09-26 MED ORDER — METFORMIN HCL ER 500 MG PO TB24: ORAL_TABLET | ORAL | 1 refills | Status: DC

## 2019-09-26 MED ORDER — HYDROCODONE-ACETAMINOPHEN 5-325 MG PO TABS: 1.0000 | ORAL_TABLET | ORAL | 0 refills | Status: DC | PRN

## 2019-09-26 NOTE — Assessment & Plan Note (Signed)
Chronic  Clinically euthyroid Check tsh  Titrate med dose if needed  

## 2019-09-26 NOTE — Assessment & Plan Note (Signed)
Chronic Check a1c Low sugar / carb diet Stressed regular exercise  Can consider starting rybelsus and stopping [place] and glimepiride once on higher dose

## 2019-09-26 NOTE — Assessment & Plan Note (Signed)
Chronic Check lipid panel  Continue daily statin Regular exercise and healthy diet encouraged  

## 2019-09-26 NOTE — Assessment & Plan Note (Signed)
Chronic No symptoms Sees cardio On xarelto, metoprolol Cbc, tsh

## 2019-09-26 NOTE — Assessment & Plan Note (Signed)
Acute Started yesterday Tenderness in LLQ ? Cause No h/o diverticulosis so diverticulitis less likely H/o renal stone so that is a possibility but no concerning urinary symptoms KUB UA, cmp, cbc

## 2019-09-26 NOTE — Assessment & Plan Note (Signed)
Chronic BP well controlled Current regimen effective and well tolerated Continue current medications at current doses cmp  

## 2019-09-27 ENCOUNTER — Telehealth: Payer: Medicare Other | Admitting: Internal Medicine

## 2019-09-27 LAB — COMPLETE METABOLIC PANEL WITH GFR
AG Ratio: 1.8 (calc) (ref 1.0–2.5)
ALT: 17 U/L (ref 9–46)
AST: 15 U/L (ref 10–35)
Albumin: 4.6 g/dL (ref 3.6–5.1)
Alkaline phosphatase (APISO): 49 U/L (ref 35–144)
BUN: 16 mg/dL (ref 7–25)
CO2: 28 mmol/L (ref 20–32)
Calcium: 10.1 mg/dL (ref 8.6–10.3)
Chloride: 101 mmol/L (ref 98–110)
Creat: 1.1 mg/dL (ref 0.70–1.18)
GFR, Est African American: 77 mL/min/{1.73_m2} (ref >=60)
GFR, Est Non African American: 67 mL/min/{1.73_m2} (ref >=60)
Globulin: 2.6 g/dL (calc) (ref 1.9–3.7)
Glucose, Bld: 194 mg/dL — ABNORMAL HIGH (ref 65–99)
Potassium: 5.8 mmol/L — ABNORMAL HIGH (ref 3.5–5.3)
Sodium: 140 mmol/L (ref 135–146)
Total Bilirubin: 0.8 mg/dL (ref 0.2–1.2)
Total Protein: 7.2 g/dL (ref 6.1–8.1)

## 2019-09-27 LAB — URINALYSIS, ROUTINE W REFLEX MICROSCOPIC
Bacteria, UA: NONE SEEN /HPF
Bilirubin Urine: NEGATIVE
Hyaline Cast: NONE SEEN /LPF
Ketones, ur: NEGATIVE
Leukocytes,Ua: NEGATIVE
Nitrite: NEGATIVE
Specific Gravity, Urine: 1.035 (ref 1.001–1.03)
Squamous Epithelial / HPF: NONE SEEN /HPF (ref <=5)
WBC, UA: NONE SEEN /HPF (ref 0–5)
pH: 6 (ref 5.0–8.0)

## 2019-09-27 LAB — CBC WITH DIFFERENTIAL/PLATELET
Absolute Monocytes: 721 cells/uL (ref 200–950)
Basophils Absolute: 98 cells/uL (ref 0–200)
Basophils Relative: 1.4 %
Eosinophils Absolute: 448 cells/uL (ref 15–500)
Eosinophils Relative: 6.4 %
HCT: 50.5 % — ABNORMAL HIGH (ref 38.5–50.0)
Hemoglobin: 16.7 g/dL (ref 13.2–17.1)
Lymphs Abs: 1113 cells/uL (ref 850–3900)
MCH: 29.1 pg (ref 27.0–33.0)
MCHC: 33.1 g/dL (ref 32.0–36.0)
MCV: 88.1 fL (ref 80.0–100.0)
MPV: 11.5 fL (ref 7.5–12.5)
Monocytes Relative: 10.3 %
Neutro Abs: 4620 cells/uL (ref 1500–7800)
Neutrophils Relative %: 66 %
Platelets: 199 10*3/uL (ref 140–400)
RBC: 5.73 10*6/uL (ref 4.20–5.80)
RDW: 13.9 % (ref 11.0–15.0)
Total Lymphocyte: 15.9 %
WBC: 7 10*3/uL (ref 3.8–10.8)

## 2019-09-27 LAB — LIPID PANEL
Cholesterol: 117 mg/dL (ref <200)
HDL: 57 mg/dL (ref >=40)
LDL Cholesterol (Calc): 41 mg/dL (calc)
Non-HDL Cholesterol (Calc): 60 mg/dL (calc) (ref <130)
Total CHOL/HDL Ratio: 2.1 (calc) (ref <5.0)
Triglycerides: 102 mg/dL (ref <150)

## 2019-09-27 LAB — TSH: TSH: 2.53 mIU/L (ref 0.40–4.50)

## 2019-09-27 LAB — HEMOGLOBIN A1C
Hgb A1c MFr Bld: 7.6 % of total Hgb — ABNORMAL HIGH (ref <5.7)
Mean Plasma Glucose: 171 (calc)
eAG (mmol/L): 9.5 (calc)

## 2019-09-27 MED ORDER — TAMSULOSIN HCL 0.4 MG PO CAPS: 0.4000 mg | ORAL_CAPSULE | Freq: Every day | ORAL | 3 refills | Status: DC

## 2019-09-27 NOTE — Addendum Note (Signed)
Addended by: Binnie Rail on: 09/27/2019 11:07 AM   Modules accepted: Orders

## 2019-10-01 ENCOUNTER — Ambulatory Visit: Payer: Medicare Other | Admitting: Internal Medicine

## 2019-10-15 NOTE — Addendum Note (Signed)
Addended by: Binnie Rail on: 10/15/2019 05:57 AM   Modules accepted: Orders

## 2019-10-15 NOTE — Addendum Note (Signed)
Addended by: Binnie Rail on: 10/15/2019 09:24 PM   Modules accepted: Orders

## 2019-10-28 ENCOUNTER — Encounter: Payer: Self-pay | Admitting: Internal Medicine

## 2019-10-28 ENCOUNTER — Other Ambulatory Visit: Payer: Self-pay

## 2019-10-28 ENCOUNTER — Ambulatory Visit (INDEPENDENT_AMBULATORY_CARE_PROVIDER_SITE_OTHER): Payer: Medicare Other | Admitting: Internal Medicine

## 2019-10-28 VITALS — BP 146/86 | HR 73 | Ht 68.0 in | Wt 187.0 lb

## 2019-10-28 DIAGNOSIS — E1169 Type 2 diabetes mellitus with other specified complication: Secondary | ICD-10-CM | POA: Insufficient documentation

## 2019-10-28 DIAGNOSIS — E1165 Type 2 diabetes mellitus with hyperglycemia: Secondary | ICD-10-CM

## 2019-10-28 MED ORDER — GLIPIZIDE 5 MG PO TABS: 5.0000 mg | ORAL_TABLET | Freq: Two times a day (BID) | ORAL | 1 refills | Status: DC

## 2019-10-28 NOTE — Patient Instructions (Addendum)
-   Stop Glimepiride  - Start Glipizide 5 mg, 1 tablet before Breakfast and 1 tablet before  - Metformin 500 mg , 2 tablets twice a day  - Continue Januvia 100 mg 1 tablet with breakfast for now  - Continue jardiance 10 mg, 1 tablet with Breakfast  for now   Check sugar before breakfast and before supper  Let me know if its consistent over 180 mg/dL or if your sugars fall below 70    HOW TO TREAT LOW BLOOD SUGARS (Blood sugar LESS THAN 70 MG/DL)  Please follow the RULE OF 15 for the treatment of hypoglycemia treatment (when your (blood sugars are less than 70 mg/dL)    STEP 1: Take 15 grams of carbohydrates when your blood sugar is low, which includes:   3-4 GLUCOSE TABS  OR  3-4 OZ OF JUICE OR REGULAR SODA OR  ONE TUBE OF GLUCOSE GEL     STEP 2: RECHECK blood sugar in 15 MINUTES STEP 3: If your blood sugar is still low at the 15 minute recheck --> then, go back to STEP 1 and treat AGAIN with another 15 grams of carbohydrates.

## 2019-10-28 NOTE — Progress Notes (Addendum)
Name: [name]  MRN/ DOB: [id number], 17-Jun-1945   Age/ Sex: 74 y.o., male    PCP: Binnie Rail, MD   Reason for Endocrinology Evaluation: Type 2 Diabetes Mellitus     Date of Initial Endocrinology Visit: 10/28/2019     PATIENT IDENTIFIER: [name] is a 74 y.o. male with a past medical history of T2Dm and Dyslipidemia. The patient presented for initial endocrinology clinic visit on 10/28/2019 for consultative assistance with his diabetes management.    HPI: [name] was    Diagnosed with DM > 15 yrs  Prior Medications tried/Intolerance: Farxiga- dry mouth and frequency Currently checking blood sugars1 x / day Hypoglycemia episodes : no             Hemoglobin A1c has ranged from 6.5% in 2015, peaking at 8.5% in 2018. Patient required assistance for hypoglycemia: no Patient has required hospitalization within the last 1 year from hyper or hypoglycemia: no   In terms of diet, the patient eats 3 meals a day, snacks 2 x day . Has juice rarely.    Is c/o itchy groin, no rash    He is concerned about the cost for [place], and Xarelto  HOME DIABETES REGIMEN: Glimepiride 2 mg daily  Glimepiride 4 mg daily  Metformin 500 mg XR 2 tabs BID  Januvia 100 mg daily  Jardiance 10 mg daily    Statin: yes ACE-I/ARB: no    METER DOWNLOAD SUMMARY: Did not bring      DIABETIC COMPLICATIONS: Microvascular complications:    Denies: CKD , retinopathy , neuropathy  Last eye exam: Completed 2021  Macrovascular complications:    Denies: CAD, PVD, CVA   PAST HISTORY: Past Medical History:  Past Medical History:  Diagnosis Date  . AF (atrial fibrillation) (HCC)    persistent afib, s/p afib ablation x 2 with subsequent convergent procedure at [facility]  . Allergic rhinitis    seasonal  . Atrial flutter ([place])   . CTS (carpal tunnel syndrome)    PMH of  . Diabetes mellitus    type 2  . External hemorrhoids   . Glaucoma   .  Hyperlipidemia   . Hypertension   . Hypothyroidism   . Renal calculus 2005   [facility]   Past Surgical History:  Past Surgical History:  Procedure Laterality Date  . ABLATION  06/2013   atypical atrial flutter ablation by Dr Lehman Prom at [facility] (mitral annular flutter ablated)  . afib 09/22/2008--11/03/2009     afib ablation x 2 by Dr Rayann Heman with subsequent convergent procedure at [facility]  . CARDIAC CATHETERIZATION  2005   non obstructive CAD  . CARDIOVERSION    . CARDIOVERSION  12/09/2010   Procedure: CARDIOVERSION;  Surgeon: Loralie Champagne, MD;  Location: [place];  Service: Cardiovascular;  Laterality: N/A;  . CARDIOVERSION  09/15/2011   Procedure: CARDIOVERSION;  Surgeon: Josue Hector, MD;  Location: [place];  Service: Cardiovascular;  Laterality: N/A;  office drawing labs wed 8/28  . CARDIOVERSION N/A 07/18/2012   Procedure: CARDIOVERSION;  Surgeon: Thayer Headings, MD;  Location: [place];  Service: Cardiovascular;  Laterality: N/A;  . CARDIOVERSION  08/2014   [place]  . COLONOSCOPY  2009   Dr Sharlett Iles  . convergent procedure  2013   at [facility] for a fib  . JOINT REPLACEMENT Right 2008, 2012  . LOOP RECORDER IMPLANT Left 12-2010  . TONSILLECTOMY    . TOTAL HIP ARTHROPLASTY  06/2010  Depuy prosthesis replacement, Dr Alvan Dame  . TOTAL HIP ARTHROPLASTY Right 2008    Depuy;Dr Alvan Dame      Social History:  reports that he quit smoking about 45 years ago. His smoking use included cigarettes. He has a 7.20 pack-year smoking history. He has never used smokeless tobacco. He reports current alcohol use. He reports that he does not use drugs. Family History:  Family History  Problem Relation Age of Onset  . Diabetes Father   . Stroke Father        mid 73s  . Lung cancer Mother        non smoker  . Stroke Mother 10  . Testicular cancer Brother   . Allergies Maternal Grandmother   . Cancer Maternal Grandmother        unsure  . Allergies Brother   . Heart attack Neg Hx       HOME MEDICATIONS: Allergies as of 10/28/2019      Reactions   Farxiga [dapagliflozin] Other (See Comments)   Dry mouth, increased urination, increased appetite      Medication List       Accurate as of October 28, 2019  1:46 PM. If you have any questions, ask your nurse or doctor.        STOP taking these medications   HYDROcodone-acetaminophen 5-325 MG tablet Commonly known as: Norco Stopped by: Dorita Sciara, MD     TAKE these medications   amLODipine 10 MG tablet Commonly known as: NORVASC TAKE 1 TABLET DAILY   atorvastatin 10 MG tablet Commonly known as: LIPITOR TAKE 1 TABLET DAILY   brimonidine 0.2 % ophthalmic solution Commonly known as: ALPHAGAN 1 drop 2 (two) times daily.   empagliflozin 10 MG Tabs tablet Commonly known as: Jardiance Take 1 tablet (10 mg total) by mouth daily.   glimepiride 2 MG tablet Commonly known as: AMARYL Take 1 tablet (2 mg total) by mouth daily before breakfast. Take in addition to 4mg  pill for total of 6 mg daily.   glimepiride 4 MG tablet Commonly known as: AMARYL Take 1 tablet (4 mg total) by mouth daily before breakfast.   levothyroxine 75 MCG tablet Commonly known as: SYNTHROID TAKE 1 TABLET DAILY BEFORE BREAKFAST   Magnesium Oxide 500 MG Caps Take 1 capsule by mouth 2 (two) times daily.   metFORMIN 500 MG 24 hr tablet Commonly known as: GLUCOPHAGE-XR Take 2 tablets (1000 mg) twice daily.   metoprolol succinate 100 MG 24 hr tablet Commonly known as: TOPROL-XL TAKE 1 TABLET TWICE A DAY AND AN EXTRA ONE-HALF (1/2) TABLET DAILY AS NEEDED FOR ATRIAL FIBRILLATION   rivaroxaban 20 MG Tabs tablet Commonly known as: Xarelto Take 1 tablet (20 mg total) by mouth daily with supper.   sitaGLIPtin 100 MG tablet Commonly known as: Januvia Take 1 tablet (100 mg total) by mouth daily.   tamsulosin 0.4 MG Caps capsule Commonly known as: FLOMAX Take 1 capsule (0.4 mg total) by mouth daily.   timolol 0.25 %  ophthalmic solution Commonly known as: BETIMOL 1-2 drops 2 (two) times daily.   Vyzulta 0.024 % Soln Generic drug: Latanoprostene Bunod Apply to eye.        ALLERGIES: Allergies  Allergen Reactions  . [place] [Dapagliflozin] Other (See Comments)    Dry mouth, increased urination, increased appetite     REVIEW OF SYSTEMS: A comprehensive ROS was conducted with the patient and is negative except as per HPI and below:  ROS    OBJECTIVE:  VITAL SIGNS: BP (!) 146/86   Pulse 73   Ht 5\' 8"  (1.727 m)   Wt 187 lb (84.8 kg)   SpO2 99%   BMI 28.43 kg/m    PHYSICAL EXAM:  General: Pt appears well and is in NAD  Neck: General: Supple without adenopathy or carotid bruits. Thyroid: Thyroid size normal.  No goiter or nodules appreciated. No thyroid bruit.  Lungs: Clear with good BS bilat with no rales, rhonchi, or wheezes  Heart: RRR with normal S1 and S2 and no gallops; no murmurs; no rub  Abdomen: Normoactive bowel sounds, soft, nontender, without masses or organomegaly palpable  Extremities:  Lower extremities - No pretibial edema. No lesions.  Skin: Normal texture and temperature to palpation. No rash noted.   Neuro: MS is good with appropriate affect, pt is alert and Ox3    DATA REVIEWED:  Lab Results  Component Value Date   HGBA1C 7.6 (H) 09/26/2019   HGBA1C 7.4 (H) 04/03/2019   HGBA1C 7.2 (H) 12/05/2018   Lab Results  Component Value Date   MICROALBUR 22.8 (H) 12/05/2018   LDLCALC 41 09/26/2019   CREATININE 1.10 09/26/2019   Lab Results  Component Value Date   MICRALBCREAT 41.8 (H) 12/05/2018    Lab Results  Component Value Date   CHOL 117 09/26/2019   HDL 57 09/26/2019   LDLCALC 41 09/26/2019   LDLDIRECT 91.0 08/23/2016   TRIG 102 09/26/2019   CHOLHDL 2.1 09/26/2019        ASSESSMENT / PLAN / RECOMMENDATIONS:   1) Type 2 Diabetes Mellitus, Sub-Optimally controlled, Without complications - Most recent A1c of 7.6 %. Goal A1c < 7.0 %.     Plan: GENERAL: I have discussed with the patient the pathophysiology of diabetes. We went over the natural progression of the disease. We talked about both insulin resistance and insulin deficiency. We stressed the importance of lifestyle changes including diet and exercise. I explained the complications associated with diabetes including retinopathy, nephropathy, neuropathy as well as increased risk of cardiovascular disease. We went over the benefit seen with glycemic control.   I explained to the patient that diabetic patients are at higher than normal risk for amputations.  The patient is very concerned about the high cost of his medications to include Jardiance and Januvia.  He gets into the donut hole within the first few months of the year.  Pain for this medication has caused hardship on the patient.  I have discussed with him patient assistance programs, patient does not believe he would qualify.  At this time he has Januvia and Jardiance at home.  We have discussed switching glimepiride to glipizide, he will continue with metformin, Jardiance and Januvia at this point.  Once he is out of [place] he was advised to contact me if his BG's are consistently over 180 MGs/DL so I can adjust his glipizide.  We discussed the risk of hypoglycemia with glipizide, and discussed the importance of taking it 15 to 20 minutes prior to a meal.  He was instructed to check his sugars twice a day, patient does not know the name of his strips and he will contact me for a refill.  We will consider stopping the Januvia in the future, but consider continuing with Jardiance  We have discussed the risks and the benefits of SGLT2 inhibitors, GLP-1 agonist, DPP 4 inhibitors, and sulfonylureas.  MEDICATIONS: - Stop Glimepiride  - Start Glipizide 5 mg, 1 tablet before Breakfast and 1 tablet  before  - Metformin 500 mg , 2 tablets twice a day    EDUCATION / INSTRUCTIONS:  BG monitoring  instructions: Patient is instructed to check his blood sugars 2 times a day, before breakfast and supper.Due to increased risk of hypoglycemia with Glipizide.   Call [place] [facility] if: BG persistently < 70 . I reviewed the Rule of 15 for the treatment of hypoglycemia in detail with the patient. Literature supplied.   2) Diabetic complications:   Eye: Does not have known diabetic retinopathy.   Neuro/ Feet: Does not have known diabetic peripheral neuropathy.  Renal: Patient does not have known baseline CKD. He is  Not on an ACEI/ARB at present.  3) Dyslipidemia: Patient is on atorvastatin 10 mg . LDL at 41 mg/dL.      I spent 45 minutes preparing to see the patient by review of recent labs, imaging and procedures, obtaining and reviewing separately obtained history, communicating with the patient, ordering medications, tests or procedures, and documenting clinical information in the EHR including the differential Dx, treatment, and any further evaluation and other management    Follow-up in 3 months  Signed electronically by: Mack Guise, MD  [facility]  [facility] [address], [place] [address] Phone: [phone] FAX: [phone]   CC: Binnie Rail, MD [place] [address] Phone: [phone]  Fax: [phone]    Return to Endocrinology clinic as below: Future Appointments  Date Time Provider [place]  10/29/2019  9:40 AM GI-315 CT 1 GI-315CT [facility] [facility]  11/03/2019  9:45 AM [facility] [place] ADVISOR [facility] None  01/07/2020  8:45 AM Hayden Pedro, MD [facility] None  03/25/2020  9:45 AM Quay Burow, Claudina Lick, MD [facility] None

## 2019-10-29 ENCOUNTER — Encounter: Payer: Self-pay | Admitting: Internal Medicine

## 2019-10-29 ENCOUNTER — Ambulatory Visit
Admission: RE | Admit: 2019-10-29 | Discharge: 2019-10-29 | Disposition: A | Discharge status: Home / Self Care | Payer: Medicare Other | Source: Ambulatory Visit | Attending: Internal Medicine | Admitting: Internal Medicine

## 2019-10-29 DIAGNOSIS — K4020 Bilateral inguinal hernia, without obstruction or gangrene, not specified as recurrent: Secondary | ICD-10-CM | POA: Diagnosis not present

## 2019-10-29 DIAGNOSIS — R109 Unspecified abdominal pain: Secondary | ICD-10-CM

## 2019-10-29 DIAGNOSIS — I700 Atherosclerosis of aorta: Secondary | ICD-10-CM | POA: Diagnosis not present

## 2019-10-29 DIAGNOSIS — N200 Calculus of kidney: Secondary | ICD-10-CM | POA: Diagnosis not present

## 2019-10-29 MED ORDER — GLIPIZIDE 5 MG PO TABS
5.0000 mg | ORAL_TABLET | Freq: Two times a day (BID) | ORAL | 1 refills | Status: DC
Start: 2019-10-29 — End: 2019-12-10

## 2019-10-30 ENCOUNTER — Encounter: Payer: Self-pay | Admitting: Internal Medicine

## 2019-10-30 DIAGNOSIS — I700 Atherosclerosis of aorta: Secondary | ICD-10-CM | POA: Insufficient documentation

## 2019-10-30 DIAGNOSIS — N200 Calculus of kidney: Secondary | ICD-10-CM | POA: Insufficient documentation

## 2019-10-31 ENCOUNTER — Encounter: Payer: Self-pay | Admitting: Internal Medicine

## 2019-11-03 ENCOUNTER — Ambulatory Visit (INDEPENDENT_AMBULATORY_CARE_PROVIDER_SITE_OTHER): Payer: Medicare Other

## 2019-11-03 DIAGNOSIS — Z0000 Encounter for general adult medical examination without abnormal findings: Secondary | ICD-10-CM

## 2019-11-03 NOTE — Progress Notes (Signed)
I connected with Luman Holway today by telephone and verified that I am speaking with the correct person using two identifiers. Location patient: home Location provider: work Persons participating in the virtual visit: Arnol Mcgibbon and [employer], LPN.   I discussed the limitations, risks, security and privacy concerns of performing an evaluation and management service by telephone and the availability of in person appointments. I also discussed with the patient that there may be a patient responsible charge related to this service. The patient expressed understanding and verbally consented to this telephonic visit.    Interactive audio and video telecommunications were attempted between this provider and patient, however failed, due to patient having technical difficulties OR patient did not have access to video capability.  We continued and completed visit with audio only.  Some vital signs may be absent or patient reported.   Time Spent with patient on telephone encounter: 20 minutes  Subjective:   [name] is a 74 y.o. male who presents for Medicare Annual/Subsequent preventive examination.  Review of Systems    No ROS. Medicare Wellness Visit. Cardiac Risk Factors include: advanced age (>58men, >28 women);dyslipidemia;family history of premature cardiovascular disease;hypertension;male gender     Objective:    There were no vitals filed for this visit. There is no height or weight on file to calculate BMI.  Advanced Directives 11/03/2019 09/04/2018 06/26/2017 02/04/2016 07/18/2012 09/15/2011 12/09/2010  Does Patient Have a Medical Advance Directive? Yes Yes Yes No Patient does not have advance directive Patient does not have advance directive -  Type of Advance Directive Living will;Healthcare Power of [place];Living will [place];Living will - - - -  Does patient want to make changes to medical advance  directive? No - Patient declined - - - - - -  Copy of [place] in Chart? No - copy requested No - copy requested No - copy requested - - - -  Pre-existing out of facility DNR order (yellow form or pink MOST form) - - - - No No No    Current Medications (verified) Outpatient Encounter Medications as of 11/03/2019  Medication Sig  . amLODipine (NORVASC) 10 MG tablet TAKE 1 TABLET DAILY  . atorvastatin (LIPITOR) 10 MG tablet TAKE 1 TABLET DAILY  . brimonidine (ALPHAGAN) 0.2 % ophthalmic solution 1 drop 2 (two) times daily.  . empagliflozin (JARDIANCE) 10 MG TABS tablet Take 1 tablet (10 mg total) by mouth daily.  Marland Kitchen glipiZIDE (GLUCOTROL) 5 MG tablet Take 1 tablet (5 mg total) by mouth 2 (two) times daily before a meal.  . Latanoprostene Bunod (VYZULTA) 0.024 % SOLN Apply to eye.  . levothyroxine (SYNTHROID) 75 MCG tablet TAKE 1 TABLET DAILY BEFORE BREAKFAST  . Magnesium Oxide 500 MG CAPS Take 1 capsule by mouth 2 (two) times daily.  . metFORMIN (GLUCOPHAGE-XR) 500 MG 24 hr tablet Take 2 tablets (1000 mg) twice daily.  . metoprolol succinate (TOPROL-XL) 100 MG 24 hr tablet TAKE 1 TABLET TWICE A DAY AND AN EXTRA ONE-HALF (1/2) TABLET DAILY AS NEEDED FOR ATRIAL FIBRILLATION  . rivaroxaban (XARELTO) 20 MG TABS tablet Take 1 tablet (20 mg total) by mouth daily with supper.  . tamsulosin (FLOMAX) 0.4 MG CAPS capsule Take 1 capsule (0.4 mg total) by mouth daily.  . timolol (BETIMOL) 0.25 % ophthalmic solution 1-2 drops 2 (two) times daily.   No facility-administered encounter medications on file as of 11/03/2019.    Allergies (verified) Farxiga [dapagliflozin]  History: Past Medical History:  Diagnosis Date  . AF (atrial fibrillation) (HCC)    persistent afib, s/p afib ablation x 2 with subsequent convergent procedure at [facility]  . Allergic rhinitis    seasonal  . Atrial flutter ([place])   . CTS (carpal tunnel syndrome)    PMH of  . Diabetes mellitus    type 2  . External  hemorrhoids   . Glaucoma   . Hyperlipidemia   . Hypertension   . Hypothyroidism   . Renal calculus 2005   [facility]   Past Surgical History:  Procedure Laterality Date  . ABLATION  06/2013   atypical atrial flutter ablation by Dr Lehman Prom at [facility] (mitral annular flutter ablated)  . afib 09/22/2008--11/03/2009     afib ablation x 2 by Dr Rayann Heman with subsequent convergent procedure at [facility]  . CARDIAC CATHETERIZATION  2005   non obstructive CAD  . CARDIOVERSION    . CARDIOVERSION  12/09/2010   Procedure: CARDIOVERSION;  Surgeon: Loralie Champagne, MD;  Location: [place];  Service: Cardiovascular;  Laterality: N/A;  . CARDIOVERSION  09/15/2011   Procedure: CARDIOVERSION;  Surgeon: Josue Hector, MD;  Location: [place];  Service: Cardiovascular;  Laterality: N/A;  office drawing labs wed 8/28  . CARDIOVERSION N/A 07/18/2012   Procedure: CARDIOVERSION;  Surgeon: Thayer Headings, MD;  Location: [place];  Service: Cardiovascular;  Laterality: N/A;  . CARDIOVERSION  08/2014   [place]  . COLONOSCOPY  2009   Dr Sharlett Iles  . convergent procedure  2013   at [facility] for a fib  . JOINT REPLACEMENT Right 2008, 2012  . LOOP RECORDER IMPLANT Left 12-2010  . TONSILLECTOMY    . TOTAL HIP ARTHROPLASTY  06/2010   Depuy prosthesis replacement, Dr Alvan Dame  . TOTAL HIP ARTHROPLASTY Right 2008    Depuy;Dr Alvan Dame   Family History  Problem Relation Age of Onset  . Diabetes Father   . Stroke Father        mid 54s  . Lung cancer Mother        non smoker  . Stroke Mother 31  . Testicular cancer Brother   . Allergies Maternal Grandmother   . Cancer Maternal Grandmother        unsure  . Allergies Brother   . Heart attack Neg Hx    Social History   Socioeconomic History  . Marital status: Married    Spouse name: [name]  . Number of children: 2  . Years of education: Not on file  . Highest education level: Not on file  Occupational History  . 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  Tobacco Use  . Smoking status: Former Smoker    Packs/day: 0.90    Years: 8.00    Pack years: 7.20    Types: Cigarettes    Quit date: 01/16/1974    Years since quitting: 45.8  . Smokeless tobacco: Never Used  . Tobacco comment: smoked  1966- 1976,up to 1 ppd  Vaping Use  . Vaping Use: Never used  Substance and Sexual Activity  . Alcohol use: Yes    Alcohol/week: 0.0 standard drinks    Comment: Socially; "sips socially"( essentially none)  . Drug use: No  . Sexual activity: Yes  Other Topics Concern  . Not on file  Social History Narrative  . Not on file   Social Determinants of Health   Financial Resource Strain: Low Risk   . Difficulty of Paying  Living Expenses: Not hard at all  Food Insecurity: No Food Insecurity  . Worried About Charity fundraiser in the Last Year: Never true  . Ran Out of Food in the Last Year: Never true  Transportation Needs: No Transportation Needs  . Lack of Transportation (Medical): No  . Lack of Transportation (Non-Medical): No  Physical Activity: Sufficiently Active  . Days of Exercise per Week: 3 days  . Minutes of Exercise per Session: 60 min  Stress: No Stress Concern Present  . Feeling of Stress : Not at all  Social Connections: Moderately Isolated  . Frequency of Communication with Friends and Family: More than three times a week  . Frequency of Social Gatherings with Friends and Family: Never  . Attends Religious Services: Never  . Active Member of Clubs or Organizations: No  . Attends Archivist Meetings: Never  . Marital Status: Married    Tobacco Counseling Counseling given: Not Answered Comment: smoked  1966- 1976,up to 1 ppd   Clinical Intake:  Pre-visit preparation completed: Yes  Pain : No/denies pain     Nutritional Risks: None Diabetes: Yes CBG done?: No Did pt. bring in CBG monitor from home?: No  How often do you need to have someone help you when you read instructions, pamphlets, or  other written materials from your doctor or pharmacy?: 1 - Never What is the last grade level you completed in school?: Associate's Degree  Diabetic? yes  Interpreter Needed?: No  Information entered by :: Duchess Armendarez N. Elli Groesbeck, LPN   Activities of Daily Living In your present state of health, do you have any difficulty performing the following activities: 11/03/2019  Hearing? N  Vision? N  Difficulty concentrating or making decisions? N  Walking or climbing stairs? N  Dressing or bathing? N  Doing errands, shopping? N  Preparing Food and eating ? N  Using the Toilet? N  In the past six months, have you accidently leaked urine? N  Do you have problems with loss of bowel control? N  Managing your Medications? N  Managing your Finances? N  Housekeeping or managing your Housekeeping? N  Some recent data might be hidden    Patient Care Team: Binnie Rail, MD as PCP - General (Internal Medicine) Thompson Grayer, MD as PCP - Cardiology (Cardiology)  Indicate any recent Medical Services you may have received from other than [facility] providers in the past year (date may be approximate).     Assessment:   This is a routine wellness examination for [name].  Hearing/Vision screen No exam data present  Dietary issues and exercise activities discussed: Current Exercise Habits: Home exercise routine, Type of exercise: walking, Time (Minutes): 60, Frequency (Times/Week): 3, Weekly Exercise (Minutes/Week): 180, Intensity: Moderate, Exercise limited by: None identified  Goals    . Patient Stated     Lose weight by monitoring my diet and eating more veg. Increase my activity by starting to walk again.     . Patient Stated     I want to increase my physical activity by walking more routinely.      Depression Screen PHQ 2/9 Scores 11/03/2019 09/04/2018 09/04/2018 06/26/2017 11/09/2016 09/28/2015 05/12/2014  PHQ - 2 Score 0 1 0 0 0 0 0  PHQ- 9 Score - - - 1 - - -    Fall Risk Fall Risk   11/03/2019 04/03/2019 09/04/2018 06/26/2017 11/09/2016  Falls in the past year? 0 0 0 No No  Comment - - - - -  Number falls in past yr: 0 - 0 - -  Injury with Fall? 0 0 0 - -  Risk for fall due to : No Fall Risks - - - -  Follow up Falls evaluation completed - - - -    Any stairs in or around the home? Yes  If so, are there any without handrails? No  Home free of loose throw rugs in walkways, pet beds, electrical cords, etc? Yes  Adequate lighting in your home to reduce risk of falls? Yes   ASSISTIVE DEVICES UTILIZED TO PREVENT FALLS:  Life alert? No  Use of a cane, walker or w/c? No  Grab bars in the bathroom? No  Shower chair or bench in shower? No  Elevated toilet seat or a handicapped toilet? No   TIMED UP AND GO:  Was the test performed? No .  Length of time to ambulate 10 feet: 0 sec.   Gait steady and fast without use of assistive device  Cognitive Function: not indicated; patient is cogitatively intact.        Immunizations Immunization History  Administered Date(s) Administered  . Fluad Quad(high Dose 65+) 09/02/2018  . Hepatitis A 03/27/2000, 10/16/2000  . Influenza Whole 10/17/2011  . Influenza, High Dose Seasonal PF 09/28/2015, 10/13/2016, 09/28/2017  . Influenza,inj,Quad PF,6+ Mos 10/16/2012, 09/29/2014, 10/13/2016  . Influenza-Unspecified 10/30/2013, 09/02/2018  . PFIZER SARS-COV-2 Vaccination 02/10/2019, 03/03/2019  . Pneumococcal Conjugate-13 02/03/2015  . Pneumococcal Polysaccharide-23 04/11/2013  . Tdap 09/21/2006  . Typhoid Inactivated 03/27/2000  . Typhoid Live 10/19/2003  . Zoster 01/05/2010    TDAP status: Due, Education has been provided regarding the importance of this vaccine. Advised may receive this vaccine at local pharmacy or Health Dept. Aware to provide a copy of the vaccination record if obtained from local pharmacy or Health Dept. Verbalized acceptance and understanding. Flu Vaccine status: Up to date Pneumococcal vaccine status: Up  to date Covid-19 vaccine status: Completed vaccines  Qualifies for Shingles Vaccine? Yes   Zostavax completed Yes   Shingrix Completed?: No.    Education has been provided regarding the importance of this vaccine. Patient has been advised to call insurance company to determine out of pocket expense if they have not yet received this vaccine. Advised may also receive vaccine at local pharmacy or Health Dept. Verbalized acceptance and understanding.  Screening Tests Health Maintenance  Topic Date Due  . TETANUS/TDAP  09/20/2016  . INFLUENZA VACCINE  08/17/2019  . FOOT EXAM  09/04/2019  . URINE MICROALBUMIN  12/05/2019  . OPHTHALMOLOGY EXAM  01/06/2020  . HEMOGLOBIN A1C  03/25/2020  . COLONOSCOPY  09/04/2022  . COVID-19 Vaccine  Completed  . Hepatitis C Screening  Completed  . PNA vac Low Risk Adult  Completed    Health Maintenance  Health Maintenance Due  Topic Date Due  . TETANUS/TDAP  09/20/2016  . INFLUENZA VACCINE  08/17/2019  . FOOT EXAM  09/04/2019  . URINE MICROALBUMIN  12/05/2019    Colorectal cancer screening: Completed 09/03/2017. Repeat every 5 years  Lung Cancer Screening: (Low Dose CT Chest recommended if Age 21-80 years, 30 pack-year currently smoking OR have quit w/in 15years.) does not qualify.   Lung Cancer Screening Referral: no  Additional Screening:  Hepatitis C Screening: does qualify; Completed yes  Vision Screening: Recommended annual ophthalmology exams for early detection of glaucoma and other disorders of the eye. Is the patient up to date with their annual eye exam?  Yes  Who is the provider or what  is the name of the office in which the patient attends annual eye exams? [facility] and Alois Cliche, OD (Glaucoma Specialist) If pt is not established with a provider, would they like to be referred to a provider to establish care? No .   Dental Screening: Recommended annual dental exams for proper oral hygiene  Community  Resource Referral / Chronic Care Management: CRR required this visit?  No   CCM required this visit?  No      Plan:     I have personally reviewed and noted the following in the patient's chart:   . Medical and social history . Use of alcohol, tobacco or illicit drugs  . Current medications and supplements . Functional ability and status . Nutritional status . Physical activity . Advanced directives . List of other physicians . Hospitalizations, surgeries, and ER visits in previous 12 months . Vitals . Screenings to include cognitive, depression, and falls . Referrals and appointments  In addition, I have reviewed and discussed with patient certain preventive protocols, quality metrics, and best practice recommendations. A written personalized care plan for preventive services as well as general preventive health recommendations were provided to patient.     Sheral Flow, LPN   [id number]   Nurse Notes:  Patient is cogitatively intact. There were no vitals filed for this visit. There is no height or weight on file to calculate BMI. Patient stated that he has no issues with gait or balance; does not use any assistive devices.

## 2019-11-03 NOTE — Patient Instructions (Addendum)
Mr. Moes , Thank you for taking time to come for your Medicare Wellness Visit. I appreciate your ongoing commitment to your health goals. Please review the following plan we discussed and let me know if I can assist you in the future.   Screening recommendations/referrals: Colonoscopy: 09/03/2017; due every 5 years  Recommended yearly ophthalmology/optometry visit for glaucoma screening and checkup Recommended yearly dental visit for hygiene and checkup  Vaccinations: Influenza vaccine: 09/25/2019 at [facility] (verified) Pneumococcal vaccine: up to date Tdap vaccine: declined Shingles vaccine: never done   Covid-19: up to date  Advanced directives: Please bring a copy of your health care power of attorney and living will to the office at your convenience.  Conditions/risks identified: Yes; Reviewed health maintenance screenings with patient today and relevant education, vaccines, and/or referrals were provided. Please continue to do your personal lifestyle choices by: daily care of teeth and gums, regular physical activity (goal should be 5 days a week for 30 minutes), eat a healthy diet, avoid tobacco and drug use, limiting any alcohol intake, taking a low-dose aspirin (if not allergic or have been advised by your provider otherwise) and taking vitamins and minerals as recommended by your provider. Continue doing brain stimulating activities (puzzles, reading, adult coloring books, staying active) to keep memory sharp. Continue to eat heart healthy diet (full of fruits, vegetables, whole grains, lean protein, water--limit salt, fat, and sugar intake) and increase physical activity as tolerated.  Next appointment: Please schedule your next Medicare Wellness Visit with your Nurse Health Advisor in 1 year by calling [phone].  Preventive Care 20 Years and Older, Male Preventive care refers to lifestyle choices and visits with your health care provider that can promote health and  wellness. What does preventive care include?  A yearly physical exam. This is also called an annual well check.  Dental exams once or twice a year.  Routine eye exams. Ask your health care provider how often you should have your eyes checked.  Personal lifestyle choices, including:  Daily care of your teeth and gums.  Regular physical activity.  Eating a healthy diet.  Avoiding tobacco and drug use.  Limiting alcohol use.  Practicing safe sex.  Taking low doses of aspirin every day.  Taking vitamin and mineral supplements as recommended by your health care provider. What happens during an annual well check? The services and screenings done by your health care provider during your annual well check will depend on your age, overall health, lifestyle risk factors, and family history of disease. Counseling  Your health care provider may ask you questions about your:  Alcohol use.  Tobacco use.  Drug use.  Emotional well-being.  Home and relationship well-being.  Sexual activity.  Eating habits.  History of falls.  Memory and ability to understand (cognition).  Work and work Statistician. Screening  You may have the following tests or measurements:  Height, weight, and BMI.  Blood pressure.  Lipid and cholesterol levels. These may be checked every 5 years, or more frequently if you are over 71 years old.  Skin check.  Lung cancer screening. You may have this screening every year starting at age 62 if you have a 30-pack-year history of smoking and currently smoke or have quit within the past 15 years.  Fecal occult blood test (FOBT) of the stool. You may have this test every year starting at age 65.  Flexible sigmoidoscopy or colonoscopy. You may have a sigmoidoscopy every 5 years or a colonoscopy every 10  years starting at age 56.  Prostate cancer screening. Recommendations will vary depending on your family history and other risks.  Hepatitis C blood  test.  Hepatitis B blood test.  Sexually transmitted disease (STD) testing.  Diabetes screening. This is done by checking your blood sugar (glucose) after you have not eaten for a while (fasting). You may have this done every 1-3 years.  Abdominal aortic aneurysm (AAA) screening. You may need this if you are a current or former smoker.  Osteoporosis. You may be screened starting at age 15 if you are at high risk. Talk with your health care provider about your test results, treatment options, and if necessary, the need for more tests. Vaccines  Your health care provider may recommend certain vaccines, such as:  Influenza vaccine. This is recommended every year.  Tetanus, diphtheria, and acellular pertussis (Tdap, Td) vaccine. You may need a Td booster every 10 years.  Zoster vaccine. You may need this after age 30.  Pneumococcal 13-valent conjugate (PCV13) vaccine. One dose is recommended after age 80.  Pneumococcal polysaccharide (PPSV23) vaccine. One dose is recommended after age 80. Talk to your health care provider about which screenings and vaccines you need and how often you need them. This information is not intended to replace advice given to you by your health care provider. Make sure you discuss any questions you have with your health care provider. Document Released: 01/29/2015 Document Revised: 09/22/2015 Document Reviewed: 11/03/2014 Elsevier Interactive Patient Education  2017 [employer] Prevention in the Home Falls can cause injuries. They can happen to people of all ages. There are many things you can do to make your home safe and to help prevent falls. What can I do on the outside of my home?  Regularly fix the edges of walkways and driveways and fix any cracks.  Remove anything that might make you trip as you walk through a door, such as a raised step or threshold.  Trim any bushes or trees on the path to your home.  Use bright outdoor  lighting.  Clear any walking paths of anything that might make someone trip, such as rocks or tools.  Regularly check to see if handrails are loose or broken. Make sure that both sides of any steps have handrails.  Any raised decks and porches should have guardrails on the edges.  Have any leaves, snow, or ice cleared regularly.  Use sand or salt on walking paths during winter.  Clean up any spills in your garage right away. This includes oil or grease spills. What can I do in the bathroom?  Use night lights.  Install grab bars by the toilet and in the tub and shower. Do not use towel bars as grab bars.  Use non-skid mats or decals in the tub or shower.  If you need to sit down in the shower, use a plastic, non-slip stool.  Keep the floor dry. Clean up any water that spills on the floor as soon as it happens.  Remove soap buildup in the tub or shower regularly.  Attach bath mats securely with double-sided non-slip rug tape.  Do not have throw rugs and other things on the floor that can make you trip. What can I do in the bedroom?  Use night lights.  Make sure that you have a light by your bed that is easy to reach.  Do not use any sheets or blankets that are too big for your bed. They should not hang  down onto the floor.  Have a firm chair that has side arms. You can use this for support while you get dressed.  Do not have throw rugs and other things on the floor that can make you trip. What can I do in the kitchen?  Clean up any spills right away.  Avoid walking on wet floors.  Keep items that you use a lot in easy-to-reach places.  If you need to reach something above you, use a strong step stool that has a grab bar.  Keep electrical cords out of the way.  Do not use floor polish or wax that makes floors slippery. If you must use wax, use non-skid floor wax.  Do not have throw rugs and other things on the floor that can make you trip. What can I do with my  stairs?  Do not leave any items on the stairs.  Make sure that there are handrails on both sides of the stairs and use them. Fix handrails that are broken or loose. Make sure that handrails are as long as the stairways.  Check any carpeting to make sure that it is firmly attached to the stairs. Fix any carpet that is loose or worn.  Avoid having throw rugs at the top or bottom of the stairs. If you do have throw rugs, attach them to the floor with carpet tape.  Make sure that you have a light switch at the top of the stairs and the bottom of the stairs. If you do not have them, ask someone to add them for you. What else can I do to help prevent falls?  Wear shoes that:  Do not have high heels.  Have rubber bottoms.  Are comfortable and fit you well.  Are closed at the toe. Do not wear sandals.  If you use a stepladder:  Make sure that it is fully opened. Do not climb a closed stepladder.  Make sure that both sides of the stepladder are locked into place.  Ask someone to hold it for you, if possible.  Clearly mark and make sure that you can see:  Any grab bars or handrails.  First and last steps.  Where the edge of each step is.  Use tools that help you move around (mobility aids) if they are needed. These include:  Canes.  Walkers.  Scooters.  Crutches.  Turn on the lights when you go into a dark area. Replace any light bulbs as soon as they burn out.  Set up your furniture so you have a clear path. Avoid moving your furniture around.  If any of your floors are uneven, fix them.  If there are any pets around you, be aware of where they are.  Review your medicines with your doctor. Some medicines can make you feel dizzy. This can increase your chance of falling. Ask your doctor what other things that you can do to help prevent falls. This information is not intended to replace advice given to you by your health care provider. Make sure you discuss any  questions you have with your health care provider. Document Released: 10/29/2008 Document Revised: 06/10/2015 Document Reviewed: 02/06/2014 Elsevier Interactive Patient Education  2017 [employer].

## 2019-11-11 NOTE — Telephone Encounter (Signed)
Placed the fax from [facility] for Dr Kelton Pillar to review.

## 2019-11-12 NOTE — Telephone Encounter (Signed)
Faxed order and progress notes as instructed.

## 2019-11-30 DIAGNOSIS — Z20822 Contact with and (suspected) exposure to covid-19: Secondary | ICD-10-CM | POA: Diagnosis not present

## 2019-12-02 DIAGNOSIS — H401111 Primary open-angle glaucoma, right eye, mild stage: Secondary | ICD-10-CM | POA: Diagnosis not present

## 2019-12-02 DIAGNOSIS — N200 Calculus of kidney: Secondary | ICD-10-CM | POA: Diagnosis not present

## 2019-12-02 DIAGNOSIS — H4052X1 Glaucoma secondary to other eye disorders, left eye, mild stage: Secondary | ICD-10-CM | POA: Diagnosis not present

## 2019-12-04 ENCOUNTER — Encounter: Payer: Self-pay | Admitting: Internal Medicine

## 2019-12-04 MED ORDER — RYBELSUS 3 MG PO TABS: 3.0000 mg | ORAL_TABLET | Freq: Every day | ORAL | 1 refills | Status: DC

## 2019-12-10 ENCOUNTER — Encounter: Payer: Self-pay | Admitting: Internal Medicine

## 2019-12-10 ENCOUNTER — Other Ambulatory Visit: Payer: Self-pay

## 2019-12-10 MED ORDER — AMLODIPINE BESYLATE 10 MG PO TABS
10.0000 mg | ORAL_TABLET | Freq: Every day | ORAL | 1 refills | Status: DC
Start: 2019-12-10 — End: 2020-08-17

## 2019-12-10 MED ORDER — METOPROLOL SUCCINATE ER 100 MG PO TB24: ORAL_TABLET | ORAL | 1 refills | Status: DC

## 2019-12-10 MED ORDER — ATORVASTATIN CALCIUM 10 MG PO TABS: 10.0000 mg | ORAL_TABLET | Freq: Every day | ORAL | 1 refills | Status: DC

## 2019-12-10 MED ORDER — GLIPIZIDE 5 MG PO TABS
5.0000 mg | ORAL_TABLET | Freq: Two times a day (BID) | ORAL | 1 refills | Status: DC
Start: 2019-12-10 — End: 2020-01-05

## 2019-12-10 MED ORDER — LEVOTHYROXINE SODIUM 75 MCG PO TABS: 75.0000 ug | ORAL_TABLET | Freq: Every day | ORAL | 1 refills | Status: DC

## 2019-12-19 ENCOUNTER — Other Ambulatory Visit: Payer: Self-pay | Admitting: Internal Medicine

## 2019-12-29 DIAGNOSIS — Z20822 Contact with and (suspected) exposure to covid-19: Secondary | ICD-10-CM | POA: Diagnosis not present

## 2019-12-30 DIAGNOSIS — N401 Enlarged prostate with lower urinary tract symptoms: Secondary | ICD-10-CM | POA: Diagnosis not present

## 2020-01-04 ENCOUNTER — Encounter: Payer: Self-pay | Admitting: Internal Medicine

## 2020-01-05 MED ORDER — GLIPIZIDE 5 MG PO TABS
5.0000 mg | ORAL_TABLET | Freq: Two times a day (BID) | ORAL | 1 refills | Status: DC
Start: 2020-01-05 — End: 2020-03-30

## 2020-01-06 DIAGNOSIS — N5201 Erectile dysfunction due to arterial insufficiency: Secondary | ICD-10-CM | POA: Diagnosis not present

## 2020-01-06 DIAGNOSIS — R351 Nocturia: Secondary | ICD-10-CM | POA: Diagnosis not present

## 2020-01-06 DIAGNOSIS — N200 Calculus of kidney: Secondary | ICD-10-CM | POA: Diagnosis not present

## 2020-01-07 ENCOUNTER — Other Ambulatory Visit: Payer: Self-pay

## 2020-01-07 ENCOUNTER — Telehealth: Payer: Self-pay | Admitting: Internal Medicine

## 2020-01-07 ENCOUNTER — Encounter (INDEPENDENT_AMBULATORY_CARE_PROVIDER_SITE_OTHER): Payer: Medicare Other | Admitting: Ophthalmology

## 2020-01-07 DIAGNOSIS — H35033 Hypertensive retinopathy, bilateral: Secondary | ICD-10-CM | POA: Diagnosis not present

## 2020-01-07 DIAGNOSIS — E113293 Type 2 diabetes mellitus with mild nonproliferative diabetic retinopathy without macular edema, bilateral: Secondary | ICD-10-CM

## 2020-01-07 DIAGNOSIS — I10 Essential (primary) hypertension: Secondary | ICD-10-CM | POA: Diagnosis not present

## 2020-01-07 DIAGNOSIS — H43813 Vitreous degeneration, bilateral: Secondary | ICD-10-CM

## 2020-01-07 LAB — HM DIABETES EYE EXAM

## 2020-01-07 NOTE — Progress Notes (Signed)
  Chronic Care Management   Note  01/07/2020 Name: MARGARET STAGGS MRN: [id number] DOB: 1945-03-06  STEVIE CHARTER is a 74 y.o. year old male who is a primary care patient of Burns, Claudina Lick, MD. I reached out to Arlyn Leak by phone today in response to a referral sent by Mr. Tamaj Jurgens Azzarello's PCP, Binnie Rail, MD.   Mr. Clarin was given information about Chronic Care Management services today including:  1. CCM service includes personalized support from designated clinical staff supervised by his physician, including individualized plan of care and coordination with other care providers 2. 24/7 contact phone numbers for assistance for urgent and routine care needs. 3. Service will only be billed when office clinical staff spend 20 minutes or more in a month to coordinate care. 4. Only one practitioner may furnish and bill the service in a calendar month. 5. The patient may stop CCM services at any time (effective at the end of the month) by phone call to the office staff.   Patient agreed to services and verbal consent obtained.   Follow up plan:   Carley Perdue [employer] Scheduler

## 2020-01-09 ENCOUNTER — Other Ambulatory Visit: Payer: Self-pay

## 2020-01-09 ENCOUNTER — Ambulatory Visit (HOSPITAL_COMMUNITY)
Admission: EM | Admit: 2020-01-09 | Discharge: 2020-01-09 | Disposition: A | Discharge status: Home / Self Care | Payer: Medicare Other | Attending: Family Medicine | Admitting: Family Medicine

## 2020-01-09 ENCOUNTER — Encounter (HOSPITAL_COMMUNITY): Payer: Self-pay | Admitting: *Deleted

## 2020-01-09 DIAGNOSIS — U071 COVID-19: Secondary | ICD-10-CM

## 2020-01-09 NOTE — ED Provider Notes (Signed)
[facility]    CSN: [id number] Arrival date & time: 01/09/20  1506      History   Chief Complaint Chief Complaint  Patient presents with  . Shortness of Breath    Covid +    HPI [name] is a 74 y.o. male.   HPI  Patient and his wife are both Covid positive.  [name] does have a history of hypertension, hyperlipidemia, and atrial fibrillation.  [name] called to see about a Covid infusion of antibodies but was told [name] was not a candidate.  [name] got a home pulse ox.  Today it registered 58 so [name] came in for evaluation.  [name] is short of breath with exertion.  Cough.  Fatigue.  No sweats chills or fever. [name] is here inquiring about a Z-Pak  Past Medical History:  Diagnosis Date  . AF (atrial fibrillation) (HCC)    persistent afib, s/p afib ablation x 2 with subsequent convergent procedure at [facility]  . Allergic rhinitis    seasonal  . Atrial flutter ([place])   . CTS (carpal tunnel syndrome)    PMH of  . Diabetes mellitus    type 2  . External hemorrhoids   . Glaucoma   . Hyperlipidemia   . Hypertension   . Hypothyroidism   . Renal calculus 2005   [facility]    Patient Active Problem List   Diagnosis Date Noted  . Aortic atherosclerosis ([place]) 10/30/2019  . Nephrolithiasis 10/30/2019  . Type 2 diabetes mellitus with hyperglycemia, without long-term current use of insulin ([place]) 10/28/2019  . LLQ pain 09/26/2019  . Eustachian tube dysfunction, right 06/03/2019  . Diabetes mellitus with peripheral circulatory disorder, controlled ([place]) 11/09/2016  . Lumbar radiculopathy 05/05/2015  . Atypical atrial flutter ([place]) 09/12/2014  . Obesity 06/25/2013  . Hypothyroidism 12/30/2010  . OSA (obstructive sleep apnea) 09/02/2010  . Atrial fibrillation ([place]) 04/05/2009  . ERECTILE DYSFUNCTION 05/01/2008  . Hyperlipidemia 09/06/2007  . Hypertension 09/06/2007  . 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 ABLATION  06/2013   atypical atrial flutter ablation by Dr Lehman Prom at [facility] (mitral annular flutter ablated)  . afib 09/22/2008--11/03/2009     afib ablation x 2 by Dr Rayann Heman with subsequent convergent procedure at [facility]  . CARDIAC CATHETERIZATION  2005   non obstructive CAD  . CARDIOVERSION    . CARDIOVERSION  12/09/2010   Procedure: CARDIOVERSION;  Surgeon: Loralie Champagne, MD;  Location: [facility];  Service: Cardiovascular;  Laterality: N/A;  . CARDIOVERSION  09/15/2011   Procedure: CARDIOVERSION;  Surgeon: Josue Hector, MD;  Location: [facility];  Service: Cardiovascular;  Laterality: N/A;  office drawing labs wed 8/28  . CARDIOVERSION N/A 07/18/2012   Procedure: CARDIOVERSION;  Surgeon: Thayer Headings, MD;  Location: [place] [facility];  Service: Cardiovascular;  Laterality: N/A;  . CARDIOVERSION  08/2014   [place]  . COLONOSCOPY  2009   Dr Sharlett Iles  . convergent procedure  2013   at [facility] for a fib  . JOINT REPLACEMENT Right 2008, 2012  . LOOP RECORDER IMPLANT Left 12-2010  . TONSILLECTOMY    . TOTAL HIP ARTHROPLASTY  06/2010   Depuy prosthesis replacement, Dr Alvan Dame  . TOTAL HIP ARTHROPLASTY Right 2008    Depuy;Dr [facility] Medications    Prior to Admission medications   Medication Sig Start Date End Date Taking? Authorizing Provider  amLODipine (  NORVASC) 10 MG tablet Take 1 tablet (10 mg total) by mouth daily. 12/10/19  Yes Burns, Claudina Lick, MD  atorvastatin (LIPITOR) 10 MG tablet Take 1 tablet (10 mg total) by mouth daily. 12/10/19  Yes Burns, Claudina Lick, MD  brimonidine (ALPHAGAN) 0.2 % ophthalmic solution 1 drop 2 (two) times daily. 04/24/19  Yes [provider]  empagliflozin (JARDIANCE) 10 MG TABS tablet Take 1 tablet (10 mg total) by mouth daily. 09/26/19  Yes Burns, Claudina Lick, MD  glipiZIDE (GLUCOTROL) 5 MG tablet Take 1 tablet (5 mg total) by mouth 2 (two) times daily before a meal. 01/05/20  Yes Shamleffer, Melanie Crazier, MD  Latanoprostene Bunod  (VYZULTA) 0.024 % SOLN Apply to eye.   Yes [provider]  levothyroxine (SYNTHROID) 75 MCG tablet Take 1 tablet (75 mcg total) by mouth daily before breakfast. 12/10/19  Yes Burns, Claudina Lick, MD  Magnesium Oxide 500 MG CAPS Take 1 capsule by mouth 2 (two) times daily.   Yes [provider]  metFORMIN (GLUCOPHAGE-XR) 500 MG 24 hr tablet Take 2 tablets (1000 mg) twice daily. 09/26/19  Yes Burns, Claudina Lick, MD  metoprolol succinate (TOPROL-XL) 100 MG 24 hr tablet TAKE 1 TABLET TWICE A DAY AND AN EXTRA ONE-HALF (1/2) TABLET DAILY AS NEEDED FOR ATRIAL FIBRILLATION 12/10/19  Yes Burns, Claudina Lick, MD  rivaroxaban (XARELTO) 20 MG TABS tablet Take 1 tablet (20 mg total) by mouth daily with supper. 09/01/19  Yes Burns, Claudina Lick, MD  tamsulosin (FLOMAX) 0.4 MG CAPS capsule TAKE 1 CAPSULE BY MOUTH EVERY DAY 12/19/19  Yes Burns, Claudina Lick, MD  timolol (BETIMOL) 0.25 % ophthalmic solution 1-2 drops 2 (two) times daily.   Yes [provider]  Semaglutide (RYBELSUS) 3 MG TABS Take 3 mg by mouth daily before breakfast. 12/04/19   Shamleffer, Melanie Crazier, MD    Family History Family History  Problem Relation Age of Onset  . Diabetes Father   . Stroke Father        mid 41s  . Lung cancer Mother        non smoker  . Stroke Mother 83  . Testicular cancer Brother   . Allergies Maternal Grandmother   . Cancer Maternal Grandmother        unsure  . Allergies Brother   . Heart attack Neg Hx     Social History Social History   Tobacco Use  . Smoking status: Former Smoker    Packs/day: 0.90    Years: 8.00    Pack years: 7.20    Types: Cigarettes    Quit date: 01/16/1974    Years since quitting: 46.0  . Smokeless tobacco: Never Used  . Tobacco comment: smoked  1966- 1976,up to 1 ppd  Vaping Use  . Vaping Use: Never used  Substance Use Topics  . Alcohol use: Yes    Comment: socially  . Drug use: No     Allergies   Farxiga [dapagliflozin]   Review of Systems Review of  Systems See HPI  Physical Exam Triage Vital Signs ED Triage Vitals  Enc Vitals Group     BP 01/09/20 1515 (!) 160/83     Pulse Rate 01/09/20 1515 71     Resp 01/09/20 1515 18     Temp 01/09/20 1515 98.4 F (36.9 C)     Temp Source 01/09/20 1515 Oral     SpO2 01/09/20 1515 97 %     Weight --      Height --  Head Circumference --      Peak Flow --      Pain Score 01/09/20 1516 0     Pain Loc --      Pain Edu? --      Excl. in [place]? --    No data found.  Updated Vital Signs BP (!) 160/83   Pulse 71   Temp 98.4 F (36.9 C) (Oral)   Resp 18   SpO2 97%  :     Physical Exam Constitutional:      General: [name] is not in acute distress.    Appearance: [name] is well-developed and well-nourished.  HENT:     Head: Normocephalic and atraumatic.     Right Ear: Tympanic membrane and ear canal normal.     Left Ear: Tympanic membrane and ear canal normal.     Mouth/Throat:     Mouth: Oropharynx is clear and moist. Mucous membranes are moist.  Eyes:     Conjunctiva/sclera: Conjunctivae normal.     Pupils: Pupils are equal, round, and reactive to light.  Cardiovascular:     Rate and Rhythm: Normal rate and regular rhythm.     Heart sounds: Normal heart sounds.  Pulmonary:     Effort: Pulmonary effort is normal. No respiratory distress.     Comments: Lungs are clear throughout Abdominal:     General: There is no distension.     Palpations: Abdomen is soft.  Musculoskeletal:        General: No edema. Normal range of motion.     Cervical back: Normal range of motion.  Skin:    General: Skin is warm and dry.  Neurological:     Mental Status: [name] is alert.  Psychiatric:        Behavior: Behavior normal.      UC Treatments / Results  Labs (all labs ordered are listed, but only abnormal results are displayed) Labs Reviewed - No data to display  EKG   Radiology No results found.  Procedures Procedures (including critical care time)  Medications Ordered in  UC Medications - No data to display  Initial Impression / Assessment and Plan / UC Course  I have reviewed the triage vital signs and the nursing notes.  Pertinent labs & imaging results that were available during my care of the patient were reviewed by me and considered in my medical decision making (see chart for details).     Referred for Covid monitoring.  Discussed importance of staying home.  Reasons for return Final Clinical Impressions(s) / UC Diagnoses   Final diagnoses:  COVID-19     Discharge Instructions     Go to ER if you develop persistent low oxygen ( under 90) OTC medicines You will be called for COVID advice follow up   ED Prescriptions    None     PDMP not reviewed this encounter.   Raylene Everts, MD 01/09/20 [phone]

## 2020-01-09 NOTE — Discharge Instructions (Signed)
Go to ER if you develop persistent low oxygen ( under 90) OTC medicines You will be called for COVID advice follow up

## 2020-01-09 NOTE — ED Triage Notes (Signed)
Pt reports started with cough 3 days ago; wife had tested positive for Covid, then pt tested positive with home kit yesterday.  C/O runny nose, head congestion, burning cough.  States SaO2 at home this AM was 88%RA; just prior to arrive SaO2 95%RA.

## 2020-01-11 DIAGNOSIS — Z20822 Contact with and (suspected) exposure to covid-19: Secondary | ICD-10-CM | POA: Diagnosis not present

## 2020-02-03 ENCOUNTER — Encounter: Payer: Self-pay | Admitting: Internal Medicine

## 2020-02-03 ENCOUNTER — Ambulatory Visit: Payer: Medicare Other | Admitting: Internal Medicine

## 2020-02-06 ENCOUNTER — Ambulatory Visit: Payer: Medicare Other | Admitting: Internal Medicine

## 2020-02-06 NOTE — Telephone Encounter (Signed)
Called as instructed and [facility] have faxed the form.  Place in the inbox.

## 2020-02-13 ENCOUNTER — Encounter: Payer: Self-pay | Admitting: Internal Medicine

## 2020-02-13 NOTE — Progress Notes (Signed)
Outside notes received. Information abstracted. Notes sent to scan.  

## 2020-03-01 ENCOUNTER — Telehealth: Payer: Self-pay | Admitting: Pharmacist

## 2020-03-01 ENCOUNTER — Other Ambulatory Visit: Payer: Self-pay | Admitting: Internal Medicine

## 2020-03-01 NOTE — Progress Notes (Signed)
Chronic Care Management Pharmacy Assistant   Name: [name]  MRN: [id number] DOB: 12-Jul-1945  Reason for Encounter: Initial Questions   PCP : Binnie Rail, MD  Allergies:   Allergies  Allergen Reactions   Farxiga [Dapagliflozin] Other (See Comments)    Dry mouth, increased urination, increased appetite    Medications: Outpatient Encounter Medications as of 03/01/2020  Medication Sig Note   amLODipine (NORVASC) 10 MG tablet Take 1 tablet (10 mg total) by mouth daily.    atorvastatin (LIPITOR) 10 MG tablet Take 1 tablet (10 mg total) by mouth daily.    brimonidine (ALPHAGAN) 0.2 % ophthalmic solution 1 drop 2 (two) times daily.    empagliflozin (JARDIANCE) 10 MG TABS tablet Take 1 tablet (10 mg total) by mouth daily.    glipiZIDE (GLUCOTROL) 5 MG tablet Take 1 tablet (5 mg total) by mouth 2 (two) times daily before a meal.    Latanoprostene Bunod (VYZULTA) 0.024 % SOLN Apply to eye.    levothyroxine (SYNTHROID) 75 MCG tablet Take 1 tablet (75 mcg total) by mouth daily before breakfast.    Magnesium Oxide 500 MG CAPS Take 1 capsule by mouth 2 (two) times daily.    metFORMIN (GLUCOPHAGE-XR) 500 MG 24 hr tablet Take 2 tablets (1000 mg) twice daily.    metoprolol succinate (TOPROL-XL) 100 MG 24 hr tablet TAKE 1 TABLET TWICE A DAY AND AN EXTRA ONE-HALF (1/2) TABLET DAILY AS NEEDED FOR ATRIAL FIBRILLATION    rivaroxaban (XARELTO) 20 MG TABS tablet Take 1 tablet (20 mg total) by mouth daily with supper.    Semaglutide (RYBELSUS) 3 MG TABS Take 3 mg by mouth daily before breakfast. 01/09/2020: Hasn't started Rx yet   tamsulosin (FLOMAX) 0.4 MG CAPS capsule TAKE 1 CAPSULE BY MOUTH EVERY DAY    timolol (BETIMOL) 0.25 % ophthalmic solution 1-2 drops 2 (two) times daily.    No facility-administered encounter medications on file as of 03/01/2020.    Current Diagnosis: Patient Active Problem List   Diagnosis Date Noted   Aortic atherosclerosis ([place]) 10/30/2019    Nephrolithiasis 10/30/2019   Type 2 diabetes mellitus with hyperglycemia, without long-term current use of insulin ([place]) 10/28/2019   LLQ pain 09/26/2019   Eustachian tube dysfunction, right 06/03/2019   Diabetes mellitus with peripheral circulatory disorder, controlled ([place]) 11/09/2016   Lumbar radiculopathy 05/05/2015   Atypical atrial flutter ([place]) 09/12/2014   Obesity 06/25/2013   Hypothyroidism 12/30/2010   OSA (obstructive sleep apnea) 09/02/2010   Atrial fibrillation ([place]) 04/05/2009   ERECTILE DYSFUNCTION 05/01/2008   Hyperlipidemia 09/06/2007   Hypertension 09/06/2007   BPH with obstruction/lower urinary tract symptoms 04/25/2007    Goals Addressed   None     Follow-Up:  Pharmacist Review   Have you seen any other providers since your last visit? The patient states that he last saw Dr. Kelton Pillar a few months ago.  Any changes in your medications or health? The patient states that [employer] changed him from Glimepiride to Glipizide  Any side effects from any medications? The patient states that he does not have any side effects from medications  Do you have an symptoms or problems not managed by your medications? The patient states that he does not have any symptoms or problems not managed by medications  Any concerns about your health right now? The patient states that he does not have any concerns about his health at this time  Has your provider asked that you check blood pressure, blood sugar,  or follow special diet at home? The patient states that he does not check blood pressure but this morning his blood sugar was 129  Do you get any type of exercise on a regular basis? The patient states that he does not get do any exercise  Can you think of a goal you would like to reach for your health? The patient states that he would like to lose weight  Do you have any problems getting your medications? The patient states that he does not have any  trouble getting medications   Is there anything that you would like to discuss during the appointment? The patient states tha he would like to discuss the cost of some of his medications  Please bring medications and supplements to appointment  [place], [place] [phone]

## 2020-03-02 ENCOUNTER — Ambulatory Visit (INDEPENDENT_AMBULATORY_CARE_PROVIDER_SITE_OTHER): Payer: Medicare Other | Admitting: Pharmacist

## 2020-03-02 ENCOUNTER — Other Ambulatory Visit: Payer: Self-pay

## 2020-03-02 DIAGNOSIS — H409 Unspecified glaucoma: Secondary | ICD-10-CM | POA: Diagnosis not present

## 2020-03-02 DIAGNOSIS — N138 Other obstructive and reflux uropathy: Secondary | ICD-10-CM

## 2020-03-02 DIAGNOSIS — E039 Hypothyroidism, unspecified: Secondary | ICD-10-CM

## 2020-03-02 DIAGNOSIS — N401 Enlarged prostate with lower urinary tract symptoms: Secondary | ICD-10-CM | POA: Diagnosis not present

## 2020-03-02 DIAGNOSIS — I10 Essential (primary) hypertension: Secondary | ICD-10-CM

## 2020-03-02 DIAGNOSIS — E1151 Type 2 diabetes mellitus with diabetic peripheral angiopathy without gangrene: Secondary | ICD-10-CM

## 2020-03-02 DIAGNOSIS — I4891 Unspecified atrial fibrillation: Secondary | ICD-10-CM | POA: Diagnosis not present

## 2020-03-02 DIAGNOSIS — E7849 Other hyperlipidemia: Secondary | ICD-10-CM | POA: Diagnosis not present

## 2020-03-02 NOTE — Patient Instructions (Addendum)
Visit Information  Phone number for Pharmacist: [phone]  Thank you for meeting with me to discuss your medications! I look forward to working with you to achieve your health care goals. Below is a summary of what we talked about during the visit:  Goals Addressed            This Visit's Progress   . Manage My Medicine       Timeframe:  Long-Range Goal Priority:  High Start Date:     03/02/20                        Expected End Date:       03/02/21              Follow Up Date 09/15/20   - call for medicine refill 2 or 3 days before it runs out - call if I am sick and can't take my medicine - keep a list of all the medicines I take; vitamins and herbals too - use a pillbox to sort medicine  - once Januvia is out, replace with Rybelsus 3 mg daily -Take Rybelsus 3 mg on empty stomach with 4 oz of water 30 min before eating/drinking -Call [employer] [phone] (or go to [email or web address]) to get Xarelto for $85/month during the coverage gap ("donut hole")   Why is this important?   . These steps will help you keep on track with your medicines.       Patient Care Plan: CCM Pharmacy Care Plan    Problem Identified: Hypertension, Hyperlipidemia, Diabetes, Atrial Fibrillation, Hypothyroidism, BPH and Glaucoma   Priority: High    Long-Range Goal: Disease Management   Start Date: 03/02/2020  Expected End Date: 03/02/2021  This Visit's Progress: On track  Priority: High  Note:   Current Barriers:  . Unable to independently afford treatment regimen . Unable to independently monitor therapeutic efficacy . Unable to maintain control of diabetes   Pharmacist Clinical Goal(s):  Marland Kitchen Over the next 180 days, patient will verbalize ability to afford treatment regimen . achieve adherence to monitoring guidelines and medication adherence to achieve therapeutic efficacy . maintain control of diabetes as evidenced by improved A1c and blood sugars  through collaboration with PharmD  and provider.   Interventions: . 1:1 collaboration with Binnie Rail, MD regarding development and update of comprehensive plan of care as evidenced by provider attestation and co-signature . Inter-disciplinary care team collaboration (see longitudinal plan of care) . Comprehensive medication review performed; medication list updated in electronic medical record  Hypertension (BP goal <130/80) -controlled -Current treatment: . Amlodipine 10 mg daily . Metoprolol succinate 100 mg BID, extra 1/2 tab PRN -Current home readings: n/a -Denies hypotensive/hypertensive symptoms -Educated on BP goals and benefits of medications for prevention of heart attack, stroke and kidney damage; Daily salt intake goal < 2300 mg; Exercise goal of 150 minutes per week; -Counseled to monitor BP at home as needed, document, and provide log at future appointments -Recommended to continue current medication  Hyperlipidemia / aortic atherosclerosis: (LDL goal < 70) -controlled  -Current treatment: . Atorvastatin 10 mg daily -Current dietary patterns: tries to limit portions -Current exercise habits: limited  -Educated on Cholesterol goals;  Benefits of statin for ASCVD risk reduction; -Recommended to continue current medication  Diabetes (A1c goal <7%) -uncontrolled - pt has Rybelsus but is waiting until Januvia runs out to switch -Current medications: Marland Kitchen Metformin ER 500 mg - 2 tab BID . Glipizide 5  mg BID . Jardiance 10 mg daily . Januvia 100 mg daily . Rybelsus 3 mg daily - not started yet -Medications previously tried: Farxiga, Tradjenta, Celesta Gentile -Current home glucose readings . fasting glucose: 100-120 . post prandial glucose: 200s -Denies hypoglycemic/hyperglycemic symptoms -Educated onA1c and blood sugar goals; post-prandial blood sugars are likely driving Y1E up since fasting BG largely controlled; Rybelsus targets post-prandial blood sugars  -Counseled on differences between Rybelsus,  Januvia and Jardiance. Plan is to replace Januvia with Rybelsus and titrate Rybelsus up to 14 mg - may be able to come off Jardiance eventually -Assessed finances - household income is too high for patient assistance -Recommended to start Rybelsus once Januvia is gone - take on empty stomach with 4 oz of water  Atrial Fibrillation (Goal: prevent stroke and major bleeding) -controlled  -CHADSVASC: 4 -Current treatment: . Rate control: Metoprolol succinate 100 mg BID . Anticoagulation: Xarelto 20 mg daily -Medications previously tried: Eliquis, warfarin -Home BP and HR readings: HR 60s  -Counseled on increased risk of stroke due to Afib and benefits of anticoagulation for stroke prevention; importance of adherence to anticoagulant exactly as prescribed; -Recommended to continue current medication  -Assessed patient finances - he can use Engineer, maintenance to get reduced cost Xarelto ($85/month) when he reaches the coverage gap Music therapist: [phone] ([email or web address]) [email or web address]  Hypothyroidism (Goal: maintain TSH in goal range) -controlled -Current treatment  . Levothyroxine 75 mcg daily -Recommended to continue current medication  BPH (Goal: manage symptoms, prevent progression) -controlled  -CT 10/2019 - bilateral kidney stones -Current treatment   Finasteride 1 mg - 0.25 mg tab daily for hair growth -Medications previously tried: tamsulosin (taken for kidney stones)  -Recommended to continue current medication  Glaucoma (Goal: prevent progression) -controlled - however Vyzulta is very expensive and doctor rarely has samples -Current treatment  . Brimonidine (Alphagan) 0.2% eye drops . Latanoprostene (Vyzulta) 0.024% eye drops . Timolol 0.25% eye drops -Recommended to continue current medication Assessed patient's formulary for more affordable alternatives  -Per formulary Vyzulta is Tier 4. Other eye drops in same class include:  latanoprost 0.005%- Tier  2  Lumigan 0.01% - Tier 3  travoprost 0.004% - Tier 4  Health Maintenance -Vaccine gaps: TD booster, Covid booster -Current therapy:  . Magnesium oxide 500 mg BID . Flonase PRN -Patient is satisfied with current therapy and denies issues -Recommended to continue current medication  Patient Goals/Self-Care Activities . Over the next 180 days, patient will:  - take medications as prescribed focus on medication adherence by pill box check glucose daily, document, and provide at future appointments - call for medicine refill 2 or 3 days before it runs out - call if I am sick and can't take my medicine - keep a list of all the medicines I take; vitamins and herbals too - use a pillbox to sort medicine  - once Januvia is out, replace with Rybelsus 3 mg daily -Take Rybelsus 3 mg on empty stomach with 4 oz of water 30 min before eating/drinking -Call [employer] [phone] (or go to [email or web address]) to get Xarelto for $85/month during the coverage gap ("donut hole")  Follow Up Plan: Telephone follow up appointment with care management team member scheduled for: 6 months      [name] was given information about Chronic Care Management services today including:  1. CCM service includes personalized support from designated clinical staff supervised by his physician, including individualized plan of care and coordination with other care providers 2. 24/7 contact phone numbers  for assistance for urgent and routine care needs. 3. Standard insurance, coinsurance, copays and deductibles apply for chronic care management only during months in which we provide at least 20 minutes of these services. Most insurances cover these services at 100%, however patients may be responsible for any copay, coinsurance and/or deductible if applicable. This service may help you avoid the need for more expensive face-to-face services. 4. Only one practitioner may furnish and bill the service in a  calendar month. 5. The patient may stop CCM services at any time (effective at the end of the month) by phone call to the office staff.  Patient agreed to services and verbal consent obtained.   The patient verbalized understanding of instructions, educational materials, and care plan provided today and agreed to receive a mailed copy of patient instructions, educational materials, and care plan.  Telephone follow up appointment with pharmacy team member scheduled for: 6 months  Charlene Brooke, PharmD, BCACP Clinical Pharmacist [facility] [phone] Semaglutide Oral Tablets What is this medicine? SEMAGLUTIDE (Sem a GLOO tide) controls blood sugar in people with type 2 diabetes. It is used with lifestyle changes like diet and exercise. This medicine may be used for other purposes; ask your health care provider or pharmacist if you have questions. COMMON BRAND NAME(S): Rybelsus What should I tell my health care provider before I take this medicine? They need to know if you have any of these conditions:  endocrine tumors (MEN 2) or if someone in your family had these tumors  eye disease  history of pancreatitis  kidney disease  stomach or intestine problems  thyroid cancer or if someone in your family had thyroid cancer  vision problems  an unusual or allergic reaction to semaglutide, other medicines, foods, dyes, or preservatives  pregnant or trying to get pregnant  breast-feeding How should I use this medicine? Take this medicine by mouth. Take it as directed on the prescription label at the same time every day. Take the dose right after waking up. Do not eat or drink anything before taking it. Do not take it with any other drink except a glass of plain water that is less than 4 ounces (less than 120 mL). Do not cut, crush or chew this medicine. Swallow the tablets whole. After taking it, do not eat breakfast, drink, or take any other medicines or  vitamins for at least 30 minutes. Keep taking it unless your health care provider tells you to stop. A special MedGuide will be given to you by the pharmacist with each prescription and refill. Be sure to read this information carefully each time. Talk to your health care provider about the use of this medicine in children. Special care may be needed. Overdosage: If you think you have taken too much of this medicine contact a poison control center or emergency room at once. NOTE: This medicine is only for you. Do not share this medicine with others. What if I miss a dose? If you miss a dose, skip it. Take your next dose at the normal time. Do not take extra or 2 doses at the same time to make up for the missed dose. What may interact with this medicine? What may interact with this medicine?  aminophylline  carbamazepine  cyclosporine  digoxin  levothyroxine  other medicines for diabetes  phenytoin  tacrolimus  theophylline  warfarin Many medications may cause changes in blood sugar, these include:  alcohol containing beverages  antiviral medicines for HIV  or AIDS  aspirin and aspirin-like drugs  certain medicines for blood pressure, heart disease, irregular heart beat  chromium  diuretics  male hormones, such as estrogens or progestins, birth control pills  fenofibrate  gemfibrozil  isoniazid  lanreotide  male hormones or anabolic steroids  MAOIs like Carbex, Eldepryl, Marplan, Nardil, and Parnate  medicines for weight loss  medicines for allergies, asthma, cold, or cough  medicines for depression, anxiety, or psychotic disturbances  niacin  nicotine  NSAIDs, medicines for pain and inflammation, like ibuprofen or naproxen  octreotide  pasireotide  pentamidine  phenytoin  probenecid  quinolone antibiotics such as ciprofloxacin, levofloxacin, ofloxacin  some herbal dietary supplements  steroid medicines such as prednisone or  cortisone  sulfamethoxazole; trimethoprim  thyroid hormones Some medications can hide the warning symptoms of low blood sugar (hypoglycemia). You may need to monitor your blood sugar more closely if you are taking one of these medications. These include:  beta-blockers, often used for high blood pressure or heart problems (examples include atenolol, metoprolol, propranolol)  clonidine  guanethidine  reserpine This list may not describe all possible interactions. Give your health care provider a list of all the medicines, herbs, non-prescription drugs, or dietary supplements you use. Also tell them if you smoke, drink alcohol, or use illegal drugs. Some items may interact with your medicine. What should I watch for while using this medicine? Visit your health care provider for regular checks on your progress. Check with your health care provider if you have severe diarrhea, nausea, and vomiting, or if you sweat a lot. The loss of too much body fluid may make it dangerous for you to take this medicine. A test called the HbA1C (A1C) will be monitored. This is a simple blood test. It measures your blood sugar control over the last 2 to 3 months. You will receive this test every 3 to 6 months. Learn how to check your blood sugar. Learn the symptoms of low and high blood sugar and how to manage them. Always carry a quick-source of sugar with you in case you have symptoms of low blood sugar. Examples include hard sugar candy or glucose tablets. Make sure others know that you can choke if you eat or drink when you develop serious symptoms of low blood sugar, such as seizures or unconsciousness. Get medical help at once. Tell your health care provider if you have high blood sugar. You might need to change the dose of your medicine. If you are sick or exercising more than usual, you might need to change the dose of your medicine. Do not skip meals. Ask your health care provider if you should avoid  alcohol. Many nonprescription cough and cold products contain sugar or alcohol. These can affect blood sugar. Wear a medical ID bracelet or chain. Carry a card that describes your condition. List the medicines and doses you take on the card. Do not become pregnant while taking this medicine. Women should inform their health care provider if they wish to become pregnant or think they might be pregnant. There is a potential for serious side effects to an unborn child. Talk to your health care provider for more information. Do not breast-feed an infant while taking this medicine. What side effects may I notice from receiving this medicine? Side effects that you should report to your doctor or health care provider as soon as possible:  allergic reactions (skin rash, itching or hives; swelling of the face, lips, or tongue)  changes in vision  diarrhea that continues or is severe  infection (fever, chills, cough, sore throat, pain or trouble passing urine)  kidney injury (trouble passing urine or change in the amount of urine)  low blood sugar (feeling anxious; confusion; dizziness; increased hunger; unusually weak or tired; increased sweating; shakiness; cold, clammy skin; irritable; headache; blurred vision; fast heartbeat; loss of consciousness)  lump or swelling on the neck  painful or difficulty swallowing  severe nausea  severe or unusual stomach pain  trouble breathing  vomiting Side effects that usually do not require medical attention (report these to your doctor or health care provider if they continue or are bothersome):  constipation  diarrhea  nausea  upset stomach This list may not describe all possible side effects. Call your doctor for medical advice about side effects. You may report side effects to FDA at 1-800-FDA-1088. Where should I keep my medicine? Keep out of the reach of children and pets. Store at room temperature between 20 and 25 degrees C (68 and 77  degrees F). Keep this medicine in the original container. Protect from moisture. Keep the container tightly closed. Get rid of any unused medicine after the expiration date. To get rid of medicines that are no longer needed or have expired:  Take the medicine to a medicine take-back program. Check with your pharmacy or law enforcement to find a location.  If you cannot return the medicine, check the label or package insert to see if the medicine should be thrown out in the garbage or flushed down the toilet. If you are not sure, ask your health care provider. If it is safe to put it in the trash, take the medicine out of the container. Mix the medicine with cat litter, dirt, coffee grounds, or other unwanted substance. Seal the mixture in a bag or container. Put it in the trash. NOTE: This sheet is a summary. It may not cover all possible information. If you have questions about this medicine, talk to your doctor, pharmacist, or health care provider.  2021 Elsevier/Gold Standard (2019-12-01 15:08:33)

## 2020-03-02 NOTE — Progress Notes (Signed)
Chronic Care Management Pharmacy Note  03/02/2020 Name:  [name] MRN:  [id number] DOB:  07/30/1945  Subjective: [name] is an 75 y.o. year old male who is a primary patient of Burns, Claudina Lick, MD.  The CCM team was consulted for assistance with disease management and care coordination needs.    Engaged with patient by telephone for initial visit in response to provider referral for pharmacy case management and/or care coordination services.   Consent to Services:  The patient was given the following information about Chronic Care Management services today, agreed to services, and gave verbal consent: 1. CCM service includes personalized support from designated clinical staff supervised by the primary care provider, including individualized plan of care and coordination with other care providers 2. 24/7 contact phone numbers for assistance for urgent and routine care needs. 3. Service will only be billed when office clinical staff spend 20 minutes or more in a month to coordinate care. 4. Only one practitioner may furnish and bill the service in a calendar month. 5.The patient may stop CCM services at any time (effective at the end of the month) by phone call to the office staff. 6. The patient will be responsible for cost sharing (co-pay) of up to 20% of the service fee (after annual deductible is met). Patient agreed to services and consent obtained.  Patient Care Team: Binnie Rail, MD as PCP - General (Internal Medicine) Thompson Grayer, MD as PCP - Cardiology (Cardiology) Juluis Rainier as Consulting Physician (Optometry) [place], Cleaster Corin, [facility] as Pharmacist (Pharmacist)  Social:  Patient lives at home with wife. He has been travelling a lot the last 6 months (vacations, visiting family, etc). He notes his income is too high for patient assistance.  Recent office visits: 09/26/19 Dr Quay Burow OV: chronic f/u, LLQ pain. Ordered KUB. Consider switching Januvia to Rybelsus.  KUB showed small kidney stone, rx'd Hydrocodone and tamsulosin to help pass.  Recent consult visits: 01/09/20 [facility]: covid positive, pt was told he is not candidate for infusion, pulse ox was 88 so came in for eval. spo2 97% in office. Referred for covid monitoring.  01/06/20 Dr Louis Meckel (urology): f/u kidney stones, opted for annual monitoring.  10/28/19 Dr Kelton Pillar (endocrine): switched glimepiride to glipizide. Consider stopping Januvia in the future. Consider Rybelsus.  Hospital visits: None in previous 6 months  Objective:  Lab Results  Component Value Date   CREATININE 1.10 09/26/2019   BUN 16 09/26/2019   GFR 72.52 04/03/2019   GFRNONAA 67 09/26/2019   GFRAA 77 09/26/2019   NA 140 09/26/2019   K 5.8 (H) 09/26/2019   CALCIUM 10.1 09/26/2019   CO2 28 09/26/2019    Lab Results  Component Value Date/Time   HGBA1C 7.6 (H) 09/26/2019 11:02 AM   HGBA1C 7.4 (H) 04/03/2019 10:42 AM   GFR 72.52 04/03/2019 10:42 AM   GFR 80.84 12/05/2018 10:29 AM   MICROALBUR 22.8 (H) 12/05/2018 10:29 AM   MICROALBUR 38.4 (H) 02/01/2018 08:16 AM    Last diabetic Eye exam:  Lab Results  Component Value Date/Time   HMDIABEYEEXA Retinopathy (A) 01/07/2020 12:00 AM    Last diabetic Foot exam: No results found for: HMDIABFOOTEX   Lab Results  Component Value Date   CHOL 117 09/26/2019   HDL 57 09/26/2019   LDLCALC 41 09/26/2019   LDLDIRECT 91.0 08/23/2016   TRIG 102 09/26/2019   CHOLHDL 2.1 09/26/2019    Hepatic Function Latest Ref Rng & Units  09/26/2019 04/03/2019 12/05/2018  Total Protein 6.1 - 8.1 g/dL 7.2 7.5 7.6  Albumin 3.5 - 5.2 g/dL - 4.4 4.6  AST 10 - 35 U/L _0 ALT 9 - 46 U/L _1 Alk Phosphatase 39 - 117 U/L - 47 49  Total Bilirubin 0.2 - 1.2 mg/dL 0.8 0.6 0.7  Bilirubin, Direct 0.0 - 0.3 mg/dL - - -    Lab Results  Component Value Date/Time   TSH 2.53 09/26/2019 11:02 AM   TSH 3.13 04/03/2019 10:42 AM   FREET4 0.9 02/22/2009 02:23 PM   FREET4  1.0 05/08/2008 08:11 AM    CBC Latest Ref Rng & Units 09/26/2019 09/04/2018 02/01/2018  WBC 3.8 - 10.8 Thousand/uL 7.0 7.1 7.9  Hemoglobin 13.2 - 17.1 g/dL 16.7 16.1 15.8  Hematocrit 38.5 - 50.0 % 50.5(H) 47.1 46.9  Platelets 140 - 400 Thousand/uL 199 202.0 206.0    No results found for: VD25OH  Clinical ASCVD: Yes (aortic atherosclerosis) The ASCVD Risk score Mikey Bussing DC Jr., et al., 2013) failed to calculate for the following reasons:   The valid total cholesterol range is 130 to 320 mg/dL    Depression screen [facility] 2/9 11/03/2019 09/04/2018 09/04/2018  Decreased Interest 0 0 0  Down, Depressed, Hopeless 0 1 0  PHQ - 2 Score 0 1 0  Altered sleeping - - -  Tired, decreased energy - - -  Change in appetite - - -  Feeling bad or failure about yourself  - - -  Trouble concentrating - - -  Moving slowly or fidgety/restless - - -  Suicidal thoughts - - -  PHQ-9 Score - - -  Difficult doing work/chores - - -  Some recent data might be hidden    CHA2DS2-VASc Score = 4  The patient's score is based upon: CHF History: No HTN History: Yes Diabetes History: Yes Stroke History: No Vascular Disease History: Yes Age Score: 1 Gender Score: 0     Social History   Tobacco Use  Smoking Status Former Smoker  . Packs/day: 0.90  . Years: 8.00  . Pack years: 7.20  . Types: Cigarettes  . Quit date: 01/16/1974  . Years since quitting: 46.1  Smokeless Tobacco Never Used  Tobacco Comment   smoked  1966- 1976,up to 1 ppd   BP Readings from Last 3 Encounters:  01/09/20 (!) 160/83  10/28/19 (!) 146/86  09/26/19 134/72   Pulse Readings from Last 3 Encounters:  01/09/20 71  10/28/19 73  09/26/19 67   Wt Readings from Last 3 Encounters:  10/28/19 187 lb (84.8 kg)  09/26/19 183 lb 9.6 oz (83.3 kg)  06/03/19 188 lb (85.3 kg)    Assessment/Interventions: Review of patient past medical history, allergies, medications, health status, including review of consultants reports, laboratory and  other test data, was performed as part of comprehensive evaluation and provision of chronic care management services.   SDOH:  (Social Determinants of Health) assessments and interventions performed: Yes   CCM Care Plan  Allergies  Allergen Reactions  . Wilder Glade [Dapagliflozin] Other (See Comments)    Dry mouth, increased urination, increased appetite    Medications Reviewed Today    Reviewed by Charlton Haws, [facility] (Pharmacist) on 03/02/20 at 1148  Med List Status: <None>  Medication Order Taking? Sig Documenting Provider Last Dose Status Informant  amLODipine (NORVASC) 10 MG tablet [id number] Yes Take 1 tablet (10 mg total) by mouth daily. Binnie Rail, MD Taking Active  atorvastatin (LIPITOR) 10 MG tablet [id number] Yes Take 1 tablet (10 mg total) by mouth daily. Binnie Rail, MD Taking Active   brimonidine [facility]) 0.2 % ophthalmic solution [id number] Yes 1 drop 2 (two) times daily. [provider] Taking Active   finasteride (PROPECIA) 1 MG tablet [id number] Yes Take 0.25 mg by mouth daily. [provider] Taking Active   glipiZIDE (GLUCOTROL) 5 MG tablet [id number] Yes Take 1 tablet (5 mg total) by mouth 2 (two) times daily before a meal. Shamleffer, Melanie Crazier, MD Taking Active   JARDIANCE 10 MG TABS tablet [id number] Yes TAKE 1 TABLET DAILY Burns, Claudina Lick, MD Taking Active   Latanoprostene Bunod (VYZULTA) 0.024 % SOLN [id number] Yes Apply to eye. [provider] Taking Active   levothyroxine (SYNTHROID) 75 MCG tablet [id number] Yes Take 1 tablet (75 mcg total) by mouth daily before breakfast. Binnie Rail, MD Taking Active   Magnesium Oxide 500 MG CAPS [id number] Yes Take 1 capsule by mouth 2 (two) times daily. [provider] Taking Active   metFORMIN (GLUCOPHAGE-XR) 500 MG 24 hr tablet [id number] Yes TAKE 2 TABLETS TWICE A DAY Burns, Claudina Lick, MD Taking Active   metoprolol succinate (TOPROL-XL) 100 MG 24 hr tablet [id number] Yes TAKE 1  TABLET TWICE A DAY AND AN EXTRA ONE-HALF (1/2) TABLET DAILY AS NEEDED FOR ATRIAL FIBRILLATION Burns, Claudina Lick, MD Taking Active   rivaroxaban (XARELTO) 20 MG TABS tablet [id number] Yes Take 1 tablet (20 mg total) by mouth daily with supper. Binnie Rail, MD Taking Active   Semaglutide [facility]) 3 MG TABS [id number] No Take 3 mg by mouth daily before breakfast.  Patient not taking: Reported on 03/02/2020   Shamleffer, Melanie Crazier, MD Not Taking Active            Med Note Marilu Favre   Fri Jan 09, 2020  3:18 PM) Hasn't started Rx yet  timolol (BETIMOL) 0.25 % ophthalmic solution [id number] Yes 1-2 drops 2 (two) times daily. [provider] Taking Active           Patient Active Problem List   Diagnosis Date Noted  . Aortic atherosclerosis ([place]) 10/30/2019  . Nephrolithiasis 10/30/2019  . Type 2 diabetes mellitus with hyperglycemia, without long-term current use of insulin ([place]) 10/28/2019  . LLQ pain 09/26/2019  . Eustachian tube dysfunction, right 06/03/2019  . Diabetes mellitus with peripheral circulatory disorder, controlled ([place]) 11/09/2016  . Lumbar radiculopathy 05/05/2015  . Atypical atrial flutter ([place]) 09/12/2014  . Obesity 06/25/2013  . Hypothyroidism 12/30/2010  . OSA (obstructive sleep apnea) 09/02/2010  . Atrial fibrillation ([place]) 04/05/2009  . ERECTILE DYSFUNCTION 05/01/2008  . Hyperlipidemia 09/06/2007  . Hypertension 09/06/2007  . BPH with obstruction/lower urinary tract symptoms 04/25/2007    Immunization History  Administered Date(s) Administered  . Fluad Quad(high Dose 65+) 09/02/2018  . Hepatitis A 03/27/2000, 10/16/2000  . Influenza Whole 10/17/2011  . Influenza, High Dose Seasonal PF 09/28/2015, 10/13/2016, 09/28/2017  . Influenza,inj,Quad PF,6+ Mos 10/16/2012, 09/29/2014, 10/13/2016  . Influenza-Unspecified 10/30/2013, 09/02/2018, 09/25/2019  . PFIZER(Purple Top)SARS-COV-2 Vaccination 02/10/2019, 03/03/2019  . Pneumococcal Conjugate-13  02/03/2015  . Pneumococcal Polysaccharide-23 04/11/2013  . Tdap 09/21/2006  . Typhoid Inactivated 03/27/2000  . Typhoid Live 10/19/2003  . Zoster 01/05/2010    Conditions to be addressed/monitored:  Hypertension, Hyperlipidemia, Diabetes, Atrial Fibrillation, Hypothyroidism, BPH and Glaucoma  Care Plan : [place]  Updates made by Charlton Haws, [place] since 03/02/2020 12:00 AM    Problem: Hypertension,  Hyperlipidemia, Diabetes, Atrial Fibrillation, Hypothyroidism, BPH and Glaucoma   Priority: High    Long-Range Goal: Disease Management   Start Date: 03/02/2020  Expected End Date: 03/02/2021  This Visit's Progress: On track  Priority: High  Note:   Current Barriers:  . Unable to independently afford treatment regimen . Unable to independently monitor therapeutic efficacy . Unable to maintain control of diabetes   Pharmacist Clinical Goal(s):  Marland Kitchen Over the next 180 days, patient will verbalize ability to afford treatment regimen . achieve adherence to monitoring guidelines and medication adherence to achieve therapeutic efficacy . maintain control of diabetes as evidenced by improved A1c and blood sugars  through collaboration with PharmD and provider.   Interventions: . 1:1 collaboration with Binnie Rail, MD regarding development and update of comprehensive plan of care as evidenced by provider attestation and co-signature . Inter-disciplinary care team collaboration (see longitudinal plan of care) . Comprehensive medication review performed; medication list updated in electronic medical record  Hypertension (BP goal <130/80) -controlled -Current treatment: . Amlodipine 10 mg daily . Metoprolol succinate 100 mg BID, extra 1/2 tab PRN -Current home readings: n/a -Denies hypotensive/hypertensive symptoms -Educated on BP goals and benefits of medications for prevention of heart attack, stroke and kidney damage; Daily salt intake goal < 2300 mg; Exercise goal  of 150 minutes per week; -Counseled to monitor BP at home as needed, document, and provide log at future appointments -Recommended to continue current medication  Hyperlipidemia / aortic atherosclerosis: (LDL goal < 70) -controlled  -Current treatment: . Atorvastatin 10 mg daily -Current dietary patterns: tries to limit portions -Current exercise habits: limited  -Educated on Cholesterol goals;  Benefits of statin for ASCVD risk reduction; -Recommended to continue current medication  Diabetes (A1c goal <7%) -uncontrolled - pt has Rybelsus but is waiting until Januvia runs out to switch -Current medications: Marland Kitchen Metformin ER 500 mg - 2 tab BID . Glipizide 5 mg BID . Jardiance 10 mg daily . Januvia 100 mg daily . Rybelsus 3 mg daily - not started yet -Medications previously tried: Farxiga, Tradjenta, Celesta Gentile -Current home glucose readings . fasting glucose: 100-120 . post prandial glucose: 200s -Denies hypoglycemic/hyperglycemic symptoms -Educated onA1c and blood sugar goals; post-prandial blood sugars are likely driving K9X up since fasting BG largely controlled; Rybelsus targets post-prandial blood sugars  -Counseled on differences between Rybelsus, Januvia and Jardiance. Plan is to replace Januvia with Rybelsus and titrate Rybelsus up to 14 mg - may be able to come off Jardiance eventually -Assessed finances - household income is too high for patient assistance -Recommended to start Rybelsus once Januvia is gone - take on empty stomach with 4 oz of water  Atrial Fibrillation (Goal: prevent stroke and major bleeding) -controlled  -CHADSVASC: 4 -Current treatment: . Rate control: Metoprolol succinate 100 mg BID . Anticoagulation: Xarelto 20 mg daily -Medications previously tried: Eliquis, warfarin -Home BP and HR readings: HR 60s  -Counseled on increased risk of stroke due to Afib and benefits of anticoagulation for stroke prevention; importance of adherence to anticoagulant  exactly as prescribed; -Recommended to continue current medication  -Assessed patient finances - he can use Engineer, maintenance to get reduced cost Xarelto ($85/month) when he reaches the coverage gap Music therapist: [phone] ([email or web address]) [email or web address]  Hypothyroidism (Goal: maintain TSH in goal range) -controlled -Current treatment  . Levothyroxine 75 mcg daily -Recommended to continue current medication  BPH (Goal: manage symptoms, prevent progression) -controlled  -CT 10/2019 - bilateral kidney stones -Current treatment   Finasteride 1  mg - 0.25 mg tab daily for hair growth -Medications previously tried: tamsulosin (taken for kidney stones)  -Recommended to continue current medication  Glaucoma (Goal: prevent progression) -controlled - however Vyzulta is very expensive and doctor rarely has samples -Current treatment  . Brimonidine (Al[place]agan) 0.2% eye drops . Latanoprostene (Vyzulta) 0.024% eye drops . Timolol 0.25% eye drops -Recommended to continue current medication Assessed patient's formulary for more affordable alternatives  -Per formulary Vyzulta is Tier 4. Other eye drops in same class include:  latanoprost 0.005%- Tier 2  Lumigan 0.01% - Tier 3  travoprost 0.004% - Tier 4  Health Maintenance -Vaccine gaps: TD booster, Covid booster -Current therapy:  . Magnesium oxide 500 mg BID . Flonase PRN -Patient is satisfied with current therapy and denies issues -Recommended to continue current medication  Patient Goals/Self-Care Activities . Over the next 180 days, patient will:  - take medications as prescribed focus on medication adherence by pill box check glucose daily, document, and provide at future appointments - call for medicine refill 2 or 3 days before it runs out - call if I am sick and can't take my medicine - keep a list of all the medicines I take; vitamins and herbals too - use a pillbox to sort medicine  - once Januvia is out, replace  with Rybelsus 3 mg daily -Take Rybelsus 3 mg on empty stomach with 4 oz of water 30 min before eating/drinking -Call [employer] [phone] (or go to [email or web address]) to get Xarelto for $85/month during the coverage gap ("donut hole")  Follow Up Plan: Telephone follow up appointment with care management team member scheduled for: 6 months      Medication Assistance:  High cost medications: Xarelto, Jardiance, Alva, Rybelsus, Vyzulta -unfortunately household income is too high to qualify for patient assistance -identified more cost-effective eye drops from patient's formulary and sent to patient to share with eye doctor -informed patient of Engineer, maintenance program for Xarelto cost savings during the coverage gap ("donut hole") -Rybelsus will replace Januvia in near future, and he may be able to come off Jardiance eventually, cutting down on overall number of Brand Name medications  Patient's preferred pharmacy is:  [facility], [place] [address] [address] Phone: [phone] Fax: [phone]  Uses pill box? Yes Pt endorses 100% compliance  We discussed: Current pharmacy is preferred with insurance plan and patient is satisfied with pharmacy services Patient decided to: Continue current medication management strategy  Care Plan and Follow Up Patient Decision:  Patient agrees to Care Plan and Follow-up.  Plan: Telephone follow up appointment with care management team member scheduled for:  6 months  Charlene Brooke, PharmD, [facility] Clinical Pharmacist [facility] [phone]

## 2020-03-09 ENCOUNTER — Other Ambulatory Visit: Payer: Self-pay | Admitting: Internal Medicine

## 2020-03-11 ENCOUNTER — Other Ambulatory Visit: Payer: Self-pay

## 2020-03-11 DIAGNOSIS — I10 Essential (primary) hypertension: Secondary | ICD-10-CM

## 2020-03-24 NOTE — Patient Instructions (Addendum)
    Blood work was ordered.      Medications changes include :   none     Please followup in 6 months  

## 2020-03-24 NOTE — Progress Notes (Signed)
Subjective:    Patient ID: [name], male    DOB: [birth year], 75 y.o.   MRN: [id number]  HPI The patient is here for follow up of their chronic medical problems, including DM, htn, hypothyroidism, hyperlipidemia, Afib,    He is taking all of his medications as prescribed.   He has not been exercising.   Sugars at home - 90-135  Medications and allergies reviewed with patient and updated if appropriate.  Patient Active Problem List   Diagnosis Date Noted  . Aortic atherosclerosis ([place]) 10/30/2019  . Nephrolithiasis 10/30/2019  . Type 2 diabetes mellitus with hyperglycemia, without long-term current use of insulin ([place]) 10/28/2019  . LLQ pain 09/26/2019  . Eustachian tube dysfunction, right 06/03/2019  . Diabetes mellitus with peripheral circulatory disorder, controlled ([place]) 11/09/2016  . Lumbar radiculopathy 05/05/2015  . Atypical atrial flutter ([place]) 09/12/2014  . Hypothyroidism 12/30/2010  . OSA (obstructive sleep apnea) 09/02/2010  . Atrial fibrillation ([place]) 04/05/2009  . ERECTILE DYSFUNCTION 05/01/2008  . Hyperlipidemia 09/06/2007  . Hypertension 09/06/2007  . BPH with obstruction/lower urinary tract symptoms 04/25/2007    Current Outpatient Medications on File Prior to Visit  Medication Sig Dispense Refill  . amLODipine (NORVASC) 10 MG tablet Take 1 tablet (10 mg total) by mouth daily. 90 tablet 1  . atorvastatin (LIPITOR) 10 MG tablet Take 1 tablet (10 mg total) by mouth daily. 90 tablet 1  . brimonidine (ALPHAGAN) 0.2 % ophthalmic solution 1 drop 2 (two) times daily.    . finasteride (PROPECIA) 1 MG tablet Take 0.25 mg by mouth daily.    Marland Kitchen glipiZIDE (GLUCOTROL) 5 MG tablet Take 1 tablet (5 mg total) by mouth 2 (two) times daily before a meal. 180 tablet 1  . JARDIANCE 10 MG TABS tablet TAKE 1 TABLET DAILY 90 tablet 3  . Latanoprostene Bunod (VYZULTA) 0.024 % SOLN Apply to eye.    . levothyroxine (SYNTHROID) 75 MCG tablet Take 1 tablet (75 mcg total)  by mouth daily before breakfast. 90 tablet 1  . Magnesium Oxide 500 MG CAPS Take 1 capsule by mouth 2 (two) times daily.    . metFORMIN (GLUCOPHAGE-XR) 500 MG 24 hr tablet TAKE 2 TABLETS TWICE A DAY 360 tablet 3  . metoprolol succinate (TOPROL-XL) 100 MG 24 hr tablet TAKE 1 TABLET TWICE A DAY AND AN EXTRA ONE-HALF (1/2) TABLET DAILY AS NEEDED FOR ATRIAL FIBRILLATION 225 tablet 1  . Semaglutide (RYBELSUS) 3 MG TABS Take 3 mg by mouth daily before breakfast. 90 tablet 1  . timolol (BETIMOL) 0.25 % ophthalmic solution 1-2 drops 2 (two) times daily.    Alveda Reasons 20 MG TABS tablet TAKE 1 TABLET DAILY WITH SUPPER 90 tablet 3   No current facility-administered medications on file prior to visit.    Past Medical History:  Diagnosis Date  . AF (atrial fibrillation) (HCC)    persistent afib, s/p afib ablation x 2 with subsequent convergent procedure at [facility]  . Allergic rhinitis    seasonal  . Atrial flutter ([place])   . CTS (carpal tunnel syndrome)    PMH of  . Diabetes mellitus    type 2  . External hemorrhoids   . Glaucoma   . Hyperlipidemia   . Hypertension   . Hypothyroidism   . Renal calculus 2005   [facility]    Past Surgical History:  Procedure Laterality Date  . ABLATION  06/2013   atypical atrial flutter ablation by Dr Lehman Prom at [facility] (mitral annular  flutter ablated)  . afib 09/22/2008--11/03/2009     afib ablation x 2 by Dr Rayann Heman with subsequent convergent procedure at [facility]  . CARDIAC CATHETERIZATION  2005   non obstructive CAD  . CARDIOVERSION    . CARDIOVERSION  12/09/2010   Procedure: CARDIOVERSION;  Surgeon: Loralie Champagne, MD;  Location: [place];  Service: Cardiovascular;  Laterality: N/A;  . CARDIOVERSION  09/15/2011   Procedure: CARDIOVERSION;  Surgeon: Josue Hector, MD;  Location: [place];  Service: Cardiovascular;  Laterality: N/A;  office drawing labs wed 8/28  . CARDIOVERSION N/A 07/18/2012   Procedure: CARDIOVERSION;  Surgeon: Thayer Headings, MD;  Location: [place];  Service: Cardiovascular;  Laterality: N/A;  . CARDIOVERSION  08/2014   [place]  . COLONOSCOPY  2009   Dr Sharlett Iles  . convergent procedure  2013   at [facility] for a fib  . JOINT REPLACEMENT Right 2008, 2012  . LOOP RECORDER IMPLANT Left 12-2010  . TONSILLECTOMY    . TOTAL HIP ARTHROPLASTY  06/2010   Depuy prosthesis replacement, Dr Alvan Dame  . TOTAL HIP ARTHROPLASTY Right 2008    Depuy;Dr Alvan Dame    Social History   Socioeconomic History  . Marital status: Married    Spouse name: Izora Gala  . Number of children: 2  . Years of education: Not on file  . Highest education level: Not on file  Occupational History  . 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  Tobacco Use  . Smoking status: Former Smoker    Packs/day: 0.90    Years: 8.00    Pack years: 7.20    Types: Cigarettes    Quit date: 01/16/1974    Years since quitting: 46.2  . Smokeless tobacco: Never Used  . Tobacco comment: smoked  1966- 1976,up to 1 ppd  Vaping Use  . Vaping Use: Never used  Substance and Sexual Activity  . Alcohol use: Yes    Comment: socially  . Drug use: No  . Sexual activity: Not on file  Other Topics Concern  . Not on file  Social History Narrative  . Not on file   Social Determinants of Health   Financial Resource Strain: Low Risk   . Difficulty of Paying Living Expenses: Not hard at all  Food Insecurity: No Food Insecurity  . Worried About Charity fundraiser in the Last Year: Never true  . Ran Out of Food in the Last Year: Never true  Transportation Needs: No Transportation Needs  . Lack of Transportation (Medical): No  . Lack of Transportation (Non-Medical): No  Physical Activity: Sufficiently Active  . Days of Exercise per Week: 3 days  . Minutes of Exercise per Session: 60 min  Stress: No Stress Concern Present  . Feeling of Stress : Not at all  Social Connections: Moderately Isolated  . Frequency of Communication with Friends and Family: More than three  times a week  . Frequency of Social Gatherings with Friends and Family: Never  . Attends Religious Services: Never  . Active Member of Clubs or Organizations: No  . Attends Archivist Meetings: Never  . Marital Status: Married    Family History  Problem Relation Age of Onset  . Diabetes Father   . Stroke Father        mid 35s  . Lung cancer Mother        non smoker  . Stroke Mother 110  . Testicular cancer Brother   . Allergies  Maternal Grandmother   . Cancer Maternal Grandmother        unsure  . Allergies Brother   . Heart attack Neg Hx     Review of Systems  Constitutional: Negative for fever.  Respiratory: Negative for cough, shortness of breath and wheezing.   Cardiovascular: Positive for leg swelling (mild, on occasion). Negative for chest pain and palpitations.  Genitourinary: Negative for hematuria.  Neurological: Positive for dizziness (mild with changes on position). Negative for light-headedness and headaches.       Objective:   Vitals:   03/25/20 0940  BP: 136/76  Pulse: 66  Temp: 98.1 F (36.7 C)  SpO2: 99%   BP Readings from Last 3 Encounters:  03/25/20 136/76  01/09/20 (!) 160/83  10/28/19 (!) 146/86   Wt Readings from Last 3 Encounters:  03/25/20 184 lb (83.5 kg)  10/28/19 187 lb (84.8 kg)  09/26/19 183 lb 9.6 oz (83.3 kg)   Body mass index is 27.98 kg/m.   Physical Exam    Constitutional: Appears well-developed and well-nourished. No distress.  HENT:  Head: Normocephalic and atraumatic.  Neck: Neck supple. No tracheal deviation present. No thyromegaly present.  No cervical lymphadenopathy Cardiovascular: Normal rate, regular rhythm and normal heart sounds.   No murmur heard. No carotid bruit .  No edema Pulmonary/Chest: Effort normal and breath sounds normal. No respiratory distress. No has no wheezes. No rales.  Skin: Skin is warm and dry. Not diaphoretic.  Psychiatric: Normal mood and affect. Behavior is normal.       Assessment & Plan:    See Problem List for Assessment and Plan of chronic medical problems.    This visit occurred during the SARS-CoV-2 public health emergency.  Safety protocols were in place, including screening questions prior to the visit, additional usage of staff PPE, and extensive cleaning of exam room while observing appropriate contact time as indicated for disinfecting solutions.

## 2020-03-25 ENCOUNTER — Encounter: Payer: Self-pay | Admitting: Internal Medicine

## 2020-03-25 ENCOUNTER — Other Ambulatory Visit: Payer: Self-pay

## 2020-03-25 ENCOUNTER — Ambulatory Visit (INDEPENDENT_AMBULATORY_CARE_PROVIDER_SITE_OTHER): Payer: Medicare Other | Admitting: Internal Medicine

## 2020-03-25 VITALS — BP 136/76 | HR 66 | Temp 98.1°F | Ht 68.0 in | Wt 184.0 lb

## 2020-03-25 DIAGNOSIS — I10 Essential (primary) hypertension: Secondary | ICD-10-CM | POA: Diagnosis not present

## 2020-03-25 DIAGNOSIS — K4020 Bilateral inguinal hernia, without obstruction or gangrene, not specified as recurrent: Secondary | ICD-10-CM | POA: Insufficient documentation

## 2020-03-25 DIAGNOSIS — E039 Hypothyroidism, unspecified: Secondary | ICD-10-CM

## 2020-03-25 DIAGNOSIS — I4891 Unspecified atrial fibrillation: Secondary | ICD-10-CM

## 2020-03-25 DIAGNOSIS — E1165 Type 2 diabetes mellitus with hyperglycemia: Secondary | ICD-10-CM | POA: Diagnosis not present

## 2020-03-25 DIAGNOSIS — E7849 Other hyperlipidemia: Secondary | ICD-10-CM | POA: Diagnosis not present

## 2020-03-25 LAB — MICROALBUMIN / CREATININE URINE RATIO
Creatinine,U: 53.7 mg/dL
Microalb Creat Ratio: 53.3 mg/g — ABNORMAL HIGH (ref 0.0–30.0)
Microalb, Ur: 28.6 mg/dL — ABNORMAL HIGH (ref 0.0–1.9)

## 2020-03-25 LAB — TSH: TSH: 5.58 u[IU]/mL — ABNORMAL HIGH (ref 0.35–4.50)

## 2020-03-25 LAB — COMPREHENSIVE METABOLIC PANEL
ALT: 16 U/L (ref 0–53)
AST: 16 U/L (ref 0–37)
Albumin: 4.4 g/dL (ref 3.5–5.2)
Alkaline Phosphatase: 43 U/L (ref 39–117)
BUN: 16 mg/dL (ref 6–23)
CO2: 29 mEq/L (ref 19–32)
Calcium: 10 mg/dL (ref 8.4–10.5)
Chloride: 102 mEq/L (ref 96–112)
Creatinine, Ser: 0.92 mg/dL (ref 0.40–1.50)
GFR: 82.56 mL/min (ref >60.00)
Glucose, Bld: 166 mg/dL — ABNORMAL HIGH (ref 70–99)
Potassium: 4.9 mEq/L (ref 3.5–5.1)
Sodium: 140 mEq/L (ref 135–145)
Total Bilirubin: 0.6 mg/dL (ref 0.2–1.2)
Total Protein: 7.8 g/dL (ref 6.0–8.3)

## 2020-03-25 LAB — CBC WITH DIFFERENTIAL/PLATELET
Basophils Absolute: 0.1 10*3/uL (ref 0.0–0.1)
Basophils Relative: 1.1 % (ref 0.0–3.0)
Eosinophils Absolute: 0.6 10*3/uL (ref 0.0–0.7)
Eosinophils Relative: 7.7 % — ABNORMAL HIGH (ref 0.0–5.0)
HCT: 50.1 % (ref 39.0–52.0)
Hemoglobin: 16.9 g/dL (ref 13.0–17.0)
Lymphocytes Relative: 17.8 % (ref 12.0–46.0)
Lymphs Abs: 1.3 10*3/uL (ref 0.7–4.0)
MCHC: 33.7 g/dL (ref 30.0–36.0)
MCV: 87.3 fl (ref 78.0–100.0)
Monocytes Absolute: 0.8 10*3/uL (ref 0.1–1.0)
Monocytes Relative: 11.1 % (ref 3.0–12.0)
Neutro Abs: 4.5 10*3/uL (ref 1.4–7.7)
Neutrophils Relative %: 62.3 % (ref 43.0–77.0)
Platelets: 201 10*3/uL (ref 150.0–400.0)
RBC: 5.74 Mil/uL (ref 4.22–5.81)
RDW: 15.1 % (ref 11.5–15.5)
WBC: 7.2 10*3/uL (ref 4.0–10.5)

## 2020-03-25 LAB — HEMOGLOBIN A1C: Hgb A1c MFr Bld: 7.2 % — ABNORMAL HIGH (ref 4.6–6.5)

## 2020-03-25 LAB — LIPID PANEL
Cholesterol: 105 mg/dL (ref 0–200)
HDL: 49.4 mg/dL (ref >39.00)
LDL Cholesterol: 33 mg/dL (ref 0–99)
NonHDL: 55.79
Total CHOL/HDL Ratio: 2
Triglycerides: 115 mg/dL (ref 0.0–149.0)
VLDL: 23 mg/dL (ref 0.0–40.0)

## 2020-03-25 NOTE — Assessment & Plan Note (Signed)
Chronic BP well controlled Continue amlodipine 10 mg daily, metoprolol XL 100 mg BID cmp

## 2020-03-25 NOTE — Assessment & Plan Note (Signed)
Chronic  Clinically euthyroid Currently taking levothyroxine 75 mcg daily Check tsh  Titrate med dose if needed  

## 2020-03-25 NOTE — Assessment & Plan Note (Addendum)
Chronic Rate controlled No concerning symptoms Continue metoprolol XL 100 mg BID, xartelto 20 mg daily Cmp, cbc

## 2020-03-25 NOTE — Assessment & Plan Note (Addendum)
Chronic A1c, urine microalb Management per endocrine

## 2020-03-25 NOTE — Assessment & Plan Note (Signed)
Chronic Check lipid panel  Continue atorvastatin 10 mg daily Regular exercise and healthy diet encouraged  

## 2020-03-27 ENCOUNTER — Encounter: Payer: Self-pay | Admitting: Internal Medicine

## 2020-03-27 MED ORDER — LEVOTHYROXINE SODIUM 88 MCG PO TABS: 88.0000 ug | ORAL_TABLET | Freq: Every day | ORAL | 3 refills | Status: DC

## 2020-03-27 NOTE — Addendum Note (Signed)
Addended by: Binnie Rail on: 03/27/2020 03:52 PM   Modules accepted: Orders

## 2020-03-28 MED ORDER — LISINOPRIL 2.5 MG PO TABS: 2.5000 mg | ORAL_TABLET | Freq: Every day | ORAL | 1 refills | Status: DC

## 2020-03-30 ENCOUNTER — Ambulatory Visit (INDEPENDENT_AMBULATORY_CARE_PROVIDER_SITE_OTHER): Payer: Medicare Other | Admitting: Internal Medicine

## 2020-03-30 ENCOUNTER — Other Ambulatory Visit: Payer: Self-pay

## 2020-03-30 VITALS — BP 134/76 | HR 70 | Ht 68.0 in | Wt 185.5 lb

## 2020-03-30 DIAGNOSIS — E1129 Type 2 diabetes mellitus with other diabetic kidney complication: Secondary | ICD-10-CM | POA: Insufficient documentation

## 2020-03-30 DIAGNOSIS — R809 Proteinuria, unspecified: Secondary | ICD-10-CM | POA: Diagnosis not present

## 2020-03-30 DIAGNOSIS — E1165 Type 2 diabetes mellitus with hyperglycemia: Secondary | ICD-10-CM

## 2020-03-30 LAB — POCT GLUCOSE (DEVICE FOR HOME USE): POC Glucose: 204 mg/dl — AB (ref 70–99)

## 2020-03-30 MED ORDER — GLIPIZIDE 5 MG PO TABS
5.0000 mg | ORAL_TABLET | Freq: Two times a day (BID) | ORAL | 3 refills | Status: DC
Start: 2020-03-30 — End: 2020-06-28

## 2020-03-30 MED ORDER — EMPAGLIFLOZIN 25 MG PO TABS: 25.0000 mg | ORAL_TABLET | Freq: Every day | ORAL | 3 refills | Status: DC

## 2020-03-30 NOTE — Progress Notes (Signed)
Name: [name]  Age/ Sex: 75 y.o., male   MRN/ DOB: [id number], 11/06/1945     PCP: Binnie Rail, MD   Reason for Endocrinology Evaluation: Type 2 Diabetes Mellitus  Initial Endocrine Consultative Visit: 10/28/2019    PATIENT IDENTIFIER: [name] is a 75 y.o. male with a past medical history of T2Dm and Dyslipidemia.  The patient has followed with Endocrinology clinic since 10/28/2019 for consultative assistance with management of his diabetes.  DIABETIC HISTORY:  [name] was diagnosed with DM many years ago.Farxiga- dry mouth and frequency. His hemoglobin A1c has ranged from 6.5% in 2015, peaking at 8.5% in 2018.    On his initial visit to our clinic his A1c was 7.6% He had concerns about the cost of Jardiance, [place].    He was on Glimepiride and Metformin and we switched Glimepiride to Glipizide   SUBJECTIVE:   During the last visit (10/28/2019): A1c 7.6 % Switched Glimepiride to Glipizide and continued Metformin   Today (03/30/2020): [name] is here for a follow up on diabetes management.  He checks his blood sugars 1 times daily.  The patient has not had hypoglycemic episodes since the last clinic visit.  He did not bring his meter today   He denies nausea and diarrhea    He continues to have Januvia, jardiance and Rybelsus that he would like to use.   HOME DIABETES REGIMEN:  Glipizide 5 mg, 1 tablet BID Metformin 500 mg , 2 tablets twice a day  Januvia 100 mg daily  Jardiance 10 mg daily     Statin: Yes ACE-I/ARB: no    METER DOWNLOAD SUMMARY:  96- 142 mg/dL   DIABETIC COMPLICATIONS: Microvascular complications:    Denies: CKD, retinopathy, neuropathy  Last Eye Exam: Completed 2021  Macrovascular complications:    Denies: CAD, CVA, PVD   HISTORY:  Past Medical History:  Past Medical History:  Diagnosis Date  . AF (atrial fibrillation) (HCC)    persistent afib, s/p afib ablation x 2 with  subsequent convergent procedure at [facility]  . Allergic rhinitis    seasonal  . Atrial flutter ([place])   . CTS (carpal tunnel syndrome)    PMH of  . Diabetes mellitus    type 2  . External hemorrhoids   . Glaucoma   . Hyperlipidemia   . Hypertension   . Hypothyroidism   . Renal calculus 2005   [facility]   Past Surgical History:  Past Surgical History:  Procedure Laterality Date  . ABLATION  06/2013   atypical atrial flutter ablation by Dr Lehman Prom at [facility] (mitral annular flutter ablated)  . afib 09/22/2008--11/03/2009     afib ablation x 2 by Dr Rayann Heman with subsequent convergent procedure at [facility]  . CARDIAC CATHETERIZATION  2005   non obstructive CAD  . CARDIOVERSION    . CARDIOVERSION  12/09/2010   Procedure: CARDIOVERSION;  Surgeon: Loralie Champagne, MD;  Location: [place];  Service: Cardiovascular;  Laterality: N/A;  . CARDIOVERSION  09/15/2011   Procedure: CARDIOVERSION;  Surgeon: Josue Hector, MD;  Location: [place];  Service: Cardiovascular;  Laterality: N/A;  office drawing labs wed 8/28  . CARDIOVERSION N/A 07/18/2012   Procedure: CARDIOVERSION;  Surgeon: Thayer Headings, MD;  Location: [place];  Service: Cardiovascular;  Laterality: N/A;  . CARDIOVERSION  08/2014   [place]  . COLONOSCOPY  2009   Dr Sharlett Iles  . convergent procedure  2013   at [facility] for  a fib  . JOINT REPLACEMENT Right 2008, 2012  . LOOP RECORDER IMPLANT Left 12-2010  . TONSILLECTOMY    . TOTAL HIP ARTHROPLASTY  06/2010   Depuy prosthesis replacement, Dr Alvan Dame  . TOTAL HIP ARTHROPLASTY Right 2008    Depuy;Dr Alvan Dame    Social History:  reports that he quit smoking about 46 years ago. His smoking use included cigarettes. He has a 7.20 pack-year smoking history. He has never used smokeless tobacco. He reports current alcohol use. He reports that he does not use drugs. Family History:  Family History  Problem Relation Age of Onset  . Diabetes Father   . Stroke Father        mid 78s  . Lung  cancer Mother        non smoker  . Stroke Mother 20  . Testicular cancer Brother   . Allergies Maternal Grandmother   . Cancer Maternal Grandmother        unsure  . Allergies Brother   . Heart attack Neg Hx      HOME MEDICATIONS: Allergies as of 03/30/2020      Reactions   Farxiga [dapagliflozin] Other (See Comments)   Dry mouth, increased urination, increased appetite      Medication List       Accurate as of March 30, 2020  1:28 PM. If you have any questions, ask your nurse or doctor.        amLODipine 10 MG tablet Commonly known as: NORVASC Take 1 tablet (10 mg total) by mouth daily.   atorvastatin 10 MG tablet Commonly known as: LIPITOR Take 1 tablet (10 mg total) by mouth daily.   brimonidine 0.2 % ophthalmic solution Commonly known as: ALPHAGAN 1 drop 2 (two) times daily.   empagliflozin 25 MG Tabs tablet Commonly known as: Jardiance Take 1 tablet (25 mg total) by mouth daily before breakfast. What changed:   medication strength  how much to take  when to take this Changed by: Dorita Sciara, MD   finasteride 1 MG tablet Commonly known as: PROPECIA Take 0.25 mg by mouth daily.   glipiZIDE 5 MG tablet Commonly known as: GLUCOTROL Take 1 tablet (5 mg total) by mouth 2 (two) times daily before a meal.   levothyroxine 88 MCG tablet Commonly known as: SYNTHROID Take 1 tablet (88 mcg total) by mouth daily.   lisinopril 2.5 MG tablet Commonly known as: Zestril Take 1 tablet (2.5 mg total) by mouth daily.   Magnesium Oxide 500 MG Caps Take 1 capsule by mouth 2 (two) times daily.   metFORMIN 500 MG 24 hr tablet Commonly known as: GLUCOPHAGE-XR TAKE 2 TABLETS TWICE A DAY   metoprolol succinate 100 MG 24 hr tablet Commonly known as: TOPROL-XL TAKE 1 TABLET TWICE A DAY AND AN EXTRA ONE-HALF (1/2) TABLET DAILY AS NEEDED FOR ATRIAL FIBRILLATION   Rybelsus 3 MG Tabs Generic drug: Semaglutide Take 3 mg by mouth daily before breakfast.    timolol 0.25 % ophthalmic solution Commonly known as: BETIMOL 1-2 drops daily.   Vyzulta 0.024 % Soln Generic drug: Latanoprostene Bunod Place 1 drop into both eyes daily.   Xarelto 20 MG Tabs tablet Generic drug: rivaroxaban TAKE 1 TABLET DAILY WITH SUPPER        OBJECTIVE:   Vital Signs: BP 134/76   Pulse 70   Ht 5\' 8"  (1.727 m)   Wt 185 lb 8 oz (84.1 kg)   SpO2 98%   BMI 28.21 kg/m  Wt Readings from Last 3 Encounters:  03/30/20 185 lb 8 oz (84.1 kg)  03/25/20 184 lb (83.5 kg)  10/28/19 187 lb (84.8 kg)     Exam: General: Pt appears well and is in NAD  Lungs: Clear with good BS bilat   Heart: RRR   Extremities: No pretibial edema.   Neuro: MS is good with appropriate affect, pt is alert and Ox3     DATA REVIEWED:  Lab Results  Component Value Date   HGBA1C 7.2 (H) 03/25/2020   HGBA1C 7.6 (H) 09/26/2019   HGBA1C 7.4 (H) 04/03/2019   Lab Results  Component Value Date   MICROALBUR 28.6 (H) 03/25/2020   LDLCALC 33 03/25/2020   CREATININE 0.92 03/25/2020   Lab Results  Component Value Date   MICRALBCREAT 53.3 (H) 03/25/2020     Lab Results  Component Value Date   CHOL 105 03/25/2020   HDL 49.40 03/25/2020   LDLCALC 33 03/25/2020   LDLDIRECT 91.0 08/23/2016   TRIG 115.0 03/25/2020   CHOLHDL 2 03/25/2020         ASSESSMENT / PLAN / RECOMMENDATIONS:   1) Type 2 Diabetes Mellitus, Optimally controlled, With Microalbuminuria - Most recent A1c of 7.2 %. Goal A1c < 7.0 %.     - A1c down from 7.6 %  -  I have discussed with him patient assistance programs, patient does not believe he would qualify.  At this time he has Januvia and Jardiance at home that he will continue to use.  - Once he is done with the Januvia, will start Rybelsus 3 mg ( has at home) , he understands not to take Januvia and Rybelsus together, he has to wait until [place] is finished first.  - We discussed the renal and cardiovascular benefits of Jardiance, and I have  recommended  continuing on this as long as he is able  .   MEDICATIONS: - Glipizide 5 mg, 1 tablet before Breakfast and 1 tablet before  - Metformin 500 mg , 2 tablets twice a day  - Continue Januvia 100 mg 1 tablet with breakfast for now , when done start Rybelsus  - Increase  jardiance 25 mg, 1 tablet with Breakfast    EDUCATION / INSTRUCTIONS:  BG monitoring instructions: Patient is instructed to check his blood sugars 1 times a day, fasting   Call [place] [facility] if: BG persistently < 70  . I reviewed the Rule of 15 for the treatment of hypoglycemia in detail with the patient. Literature supplied.  2) Diabetic complications:   Eye: Does not have known diabetic retinopathy.   Neuro/ Feet: Does not have known diabetic peripheral neuropathy .   Renal: Patient does not have known baseline CKD. He   is  on an ACEI/ARB at present.      F/U in 4 months    Signed electronically by: Mack Guise, MD  [facility]  [address] [place], [place] [address] Phone: [phone] FAX: [phone]   CC: Binnie Rail, MD [place] [address] Phone: [phone]  Fax: [phone]  Return to Endocrinology clinic as below: Future Appointments  Date Time Provider [place]  04/07/2020 10:45 AM Thompson Grayer, MD [facility]  05/26/2020  9:30 AM Binnie Rail, MD [facility] None  08/03/2020  9:50 AM Jedrek Dinovo, Melanie Crazier, MD [facility] [place]  08/30/2020  1:00 PM [facility]CM PHARMACIST [facility] None  09/23/2020  9:45 AM Billey Gosling  J, MD [facility] None  01/26/2021  8:45 AM Hayden Pedro, MD [facility] None

## 2020-03-30 NOTE — Patient Instructions (Addendum)
-   Glipizide 5 mg, 1 tablet before Breakfast and 1 tablet before  - Metformin 500 mg , 2 tablets twice a day  - Continue Januvia 100 mg 1 tablet with breakfast for now , when done start Rybelsus  - Increase  jardiance 25 mg, 1 tablet with Breakfast     HOW TO TREAT LOW BLOOD SUGARS (Blood sugar LESS THAN 70 MG/DL)  Please follow the RULE OF 15 for the treatment of hypoglycemia treatment (when your (blood sugars are less than 70 mg/dL)    STEP 1: Take 15 grams of carbohydrates when your blood sugar is low, which includes:   3-4 GLUCOSE TABS  OR  3-4 OZ OF JUICE OR REGULAR SODA OR  ONE TUBE OF GLUCOSE GEL     STEP 2: RECHECK blood sugar in 15 MINUTES STEP 3: If your blood sugar is still low at the 15 minute recheck --> then, go back to STEP 1 and treat AGAIN with another 15 grams of carbohydrates.

## 2020-04-01 DIAGNOSIS — H401111 Primary open-angle glaucoma, right eye, mild stage: Secondary | ICD-10-CM | POA: Diagnosis not present

## 2020-04-01 DIAGNOSIS — H4052X1 Glaucoma secondary to other eye disorders, left eye, mild stage: Secondary | ICD-10-CM | POA: Diagnosis not present

## 2020-04-07 ENCOUNTER — Encounter: Payer: Self-pay | Admitting: Internal Medicine

## 2020-04-07 ENCOUNTER — Ambulatory Visit (INDEPENDENT_AMBULATORY_CARE_PROVIDER_SITE_OTHER): Payer: Medicare Other | Admitting: Internal Medicine

## 2020-04-07 ENCOUNTER — Other Ambulatory Visit: Payer: Self-pay

## 2020-04-07 VITALS — BP 132/68 | HR 75 | Ht 68.0 in | Wt 184.8 lb

## 2020-04-07 DIAGNOSIS — I10 Essential (primary) hypertension: Secondary | ICD-10-CM

## 2020-04-07 DIAGNOSIS — I4819 Other persistent atrial fibrillation: Secondary | ICD-10-CM

## 2020-04-07 DIAGNOSIS — I484 Atypical atrial flutter: Secondary | ICD-10-CM | POA: Diagnosis not present

## 2020-04-07 NOTE — Patient Instructions (Signed)
Medication Instructions:  Your physician recommends that you continue on your current medications as directed. Please refer to the Current Medication list given to you today.  Labwork: None ordered.  Testing/Procedures: None ordered.  Follow-Up: Your physician wants you to follow-up in: one year with    Legrand Como "Jonni Sanger" Tillery, PA-C   You will receive a reminder letter in the mail two months in advance. If you don't receive a letter, please call our office to schedule the follow-up appointment.   Any Other Special Instructions Will Be Listed Below (If Applicable).  If you need a refill on your cardiac medications before your next appointment, please call your pharmacy.

## 2020-04-07 NOTE — Progress Notes (Signed)
PCP: Binnie Rail, MD   Primary EP: Dr Vick Frees is a 75 y.o. male who presents today for routine electrophysiology followup.  Since last being seen in our clinic, the patient reports doing very well.  Today, he denies symptoms of palpitations, chest pain, shortness of breath,  lower extremity edema, dizziness, presyncope, or syncope.  The patient is otherwise without complaint today.   Past Medical History:  Diagnosis Date  . AF (atrial fibrillation) (HCC)    persistent afib, s/p afib ablation x 2 with subsequent convergent procedure at [facility]  . Allergic rhinitis    seasonal  . Atrial flutter ([place])   . CTS (carpal tunnel syndrome)    PMH of  . Diabetes mellitus    type 2  . External hemorrhoids   . Glaucoma   . Hyperlipidemia   . Hypertension   . Hypothyroidism   . Renal calculus 2005   [facility]   Past Surgical History:  Procedure Laterality Date  . ABLATION  06/2013   atypical atrial flutter ablation by Dr Lehman Prom at [facility] (mitral annular flutter ablated)  . afib 09/22/2008--11/03/2009     afib ablation x 2 by Dr Rayann Heman with subsequent convergent procedure at [facility]  . CARDIAC CATHETERIZATION  2005   non obstructive CAD  . CARDIOVERSION    . CARDIOVERSION  12/09/2010   Procedure: CARDIOVERSION;  Surgeon: Loralie Champagne, MD;  Location: [place];  Service: Cardiovascular;  Laterality: N/A;  . CARDIOVERSION  09/15/2011   Procedure: CARDIOVERSION;  Surgeon: Josue Hector, MD;  Location: [place];  Service: Cardiovascular;  Laterality: N/A;  office drawing labs wed 8/28  . CARDIOVERSION N/A 07/18/2012   Procedure: CARDIOVERSION;  Surgeon: Thayer Headings, MD;  Location: [place];  Service: Cardiovascular;  Laterality: N/A;  . CARDIOVERSION  08/2014   [place]  . COLONOSCOPY  2009   Dr Sharlett Iles  . convergent procedure  2013   at [facility] for a fib  . JOINT REPLACEMENT Right 2008, 2012  . LOOP RECORDER IMPLANT Left 12-2010  . TONSILLECTOMY    . TOTAL  HIP ARTHROPLASTY  06/2010   Depuy prosthesis replacement, Dr Alvan Dame  . TOTAL HIP ARTHROPLASTY Right 2008    Depuy;Dr Alvan Dame    ROS- all systems are reviewed and negatives except as per HPI above  Current Outpatient Medications  Medication Sig Dispense Refill  . amLODipine (NORVASC) 10 MG tablet Take 1 tablet (10 mg total) by mouth daily. 90 tablet 1  . atorvastatin (LIPITOR) 10 MG tablet Take 1 tablet (10 mg total) by mouth daily. 90 tablet 1  . brimonidine (ALPHAGAN) 0.2 % ophthalmic solution 1 drop 2 (two) times daily.    . empagliflozin (JARDIANCE) 25 MG TABS tablet Take 1 tablet (25 mg total) by mouth daily before breakfast. 90 tablet 3  . finasteride (PROPECIA) 1 MG tablet Take 0.25 mg by mouth daily.    Marland Kitchen glipiZIDE (GLUCOTROL) 5 MG tablet Take 1 tablet (5 mg total) by mouth 2 (two) times daily before a meal. 180 tablet 3  . Latanoprostene Bunod (VYZULTA) 0.024 % SOLN Place 1 drop into both eyes daily.    Marland Kitchen levothyroxine (SYNTHROID) 88 MCG tablet Take 1 tablet (88 mcg total) by mouth daily. 90 tablet 3  . lisinopril (ZESTRIL) 2.5 MG tablet Take 1 tablet (2.5 mg total) by mouth daily. 90 tablet 1  . Magnesium Oxide 500 MG CAPS Take 1 capsule by mouth 2 (two) times daily.    Marland Kitchen  metFORMIN (GLUCOPHAGE-XR) 500 MG 24 hr tablet TAKE 2 TABLETS TWICE A DAY 360 tablet 3  . metoprolol succinate (TOPROL-XL) 100 MG 24 hr tablet TAKE 1 TABLET TWICE A DAY AND AN EXTRA ONE-HALF (1/2) TABLET DAILY AS NEEDED FOR ATRIAL FIBRILLATION 225 tablet 1  . timolol (BETIMOL) 0.25 % ophthalmic solution 1-2 drops daily.    Alveda Reasons 20 MG TABS tablet TAKE 1 TABLET DAILY WITH SUPPER 90 tablet 3   No current facility-administered medications for this visit.    Physical Exam: There were no vitals filed for this visit.  GEN- The patient is well appearing, alert and oriented x 3 today.   Head- normocephalic, atraumatic Eyes-  Sclera clear, conjunctiva pink Ears- hearing intact Oropharynx- clear Lungs- Clear to  ausculation bilaterally, normal work of breathing Heart- Regular rate and rhythm, no murmurs, rubs or gallops, PMI not laterally displaced GI- soft, NT, ND, + BS Extremities- no clubbing, cyanosis, or edema  Wt Readings from Last 3 Encounters:  03/30/20 185 lb 8 oz (84.1 kg)  03/25/20 184 lb (83.5 kg)  10/28/19 187 lb (84.8 kg)    EKG tracing ordered today is personally reviewed and shows sinus rhythm, RBBB  Assessment and Plan:  1. Persistent atrial fibrillation/ atypical atrial flutter Doing very well s/p convergent procedure off AAD therapy chads2vasc score is 3.  He is on xarelto. Could consider removing his old Reveal XT loop recorder, though this may have to get done at [facility].  Eventually, hopefully this could be removed in office  2. HTN Stable No change required today   Risks, benefits and potential toxicities for medications prescribed and/or refilled reviewed with patient today.   Return to see EP APP annually  Thompson Grayer MD, [facility] 04/07/2020 10:54 AM

## 2020-04-13 DIAGNOSIS — L308 Other specified dermatitis: Secondary | ICD-10-CM | POA: Diagnosis not present

## 2020-04-13 DIAGNOSIS — Z1283 Encounter for screening for malignant neoplasm of skin: Secondary | ICD-10-CM | POA: Diagnosis not present

## 2020-04-13 DIAGNOSIS — X32XXXD Exposure to sunlight, subsequent encounter: Secondary | ICD-10-CM | POA: Diagnosis not present

## 2020-04-13 DIAGNOSIS — L648 Other androgenic alopecia: Secondary | ICD-10-CM | POA: Diagnosis not present

## 2020-04-13 DIAGNOSIS — L570 Actinic keratosis: Secondary | ICD-10-CM | POA: Diagnosis not present

## 2020-04-13 DIAGNOSIS — D485 Neoplasm of uncertain behavior of skin: Secondary | ICD-10-CM | POA: Diagnosis not present

## 2020-04-13 DIAGNOSIS — D225 Melanocytic nevi of trunk: Secondary | ICD-10-CM | POA: Diagnosis not present

## 2020-04-13 DIAGNOSIS — D224 Melanocytic nevi of scalp and neck: Secondary | ICD-10-CM | POA: Diagnosis not present

## 2020-05-05 DIAGNOSIS — M7062 Trochanteric bursitis, left hip: Secondary | ICD-10-CM | POA: Diagnosis not present

## 2020-05-05 DIAGNOSIS — M1612 Unilateral primary osteoarthritis, left hip: Secondary | ICD-10-CM | POA: Diagnosis not present

## 2020-05-05 DIAGNOSIS — Z96641 Presence of right artificial hip joint: Secondary | ICD-10-CM | POA: Diagnosis not present

## 2020-05-19 ENCOUNTER — Other Ambulatory Visit (INDEPENDENT_AMBULATORY_CARE_PROVIDER_SITE_OTHER): Payer: Medicare Other

## 2020-05-19 DIAGNOSIS — E039 Hypothyroidism, unspecified: Secondary | ICD-10-CM | POA: Diagnosis not present

## 2020-05-19 DIAGNOSIS — I10 Essential (primary) hypertension: Secondary | ICD-10-CM | POA: Diagnosis not present

## 2020-05-19 LAB — BASIC METABOLIC PANEL
BUN: 19 mg/dL (ref 6–23)
CO2: 27 mEq/L (ref 19–32)
Calcium: 9.9 mg/dL (ref 8.4–10.5)
Chloride: 103 mEq/L (ref 96–112)
Creatinine, Ser: 0.93 mg/dL (ref 0.40–1.50)
GFR: 81.41 mL/min (ref >60.00)
Glucose, Bld: 149 mg/dL — ABNORMAL HIGH (ref 70–99)
Potassium: 4.6 mEq/L (ref 3.5–5.1)
Sodium: 139 mEq/L (ref 135–145)

## 2020-05-19 LAB — TSH: TSH: 1.93 u[IU]/mL (ref 0.35–4.50)

## 2020-05-21 ENCOUNTER — Telehealth: Payer: Self-pay | Admitting: Pharmacist

## 2020-05-21 NOTE — Progress Notes (Signed)
Chronic Care Management Pharmacy Assistant   Name: [name]  MRN: [id number] DOB: [birth year]    Reason for Encounter: Diabetic Disease State Call   Conditions to be addressed/monitored: DMII   Recent office visits:  03/25/20 Dr. Quay Burow, increased Levothyroxine 88 mcg  Recent consult visits:  03/30/20 Dr. Collier Flowers Endocrinology, discontinued semaglutide 04/07/20 Dr. Thompson Grayer Cardiology  [facility] visits:  Medication Reconciliation was completed by comparing discharge summary, patient's EMR and Pharmacy list, and upon discussion with patient.  Admitted to the hospital on 01/09/20 due to SOB. Discharge date was 01/09/20. Discharged from [facility] [place]?Medications Started at [facility] Discharge:?? -started None ID  Medication Changes at Hospital Discharge: -Changed None ID  Medications Discontinued at Hospital Discharge: -Stopped None ID  Medications that remain the same after Hospital Discharge:??  -All other medications will remain the same.    Medications: Outpatient Encounter Medications as of 05/21/2020  Medication Sig  . amLODipine (NORVASC) 10 MG tablet Take 1 tablet (10 mg total) by mouth daily.  Marland Kitchen atorvastatin (LIPITOR) 10 MG tablet Take 1 tablet (10 mg total) by mouth daily.  . brimonidine (ALPHAGAN) 0.2 % ophthalmic solution 1 drop 2 (two) times daily.  . empagliflozin (JARDIANCE) 25 MG TABS tablet Take 1 tablet (25 mg total) by mouth daily before breakfast.  . finasteride (PROPECIA) 1 MG tablet Take 0.25 mg by mouth daily.  Marland Kitchen glipiZIDE (GLUCOTROL) 5 MG tablet Take 1 tablet (5 mg total) by mouth 2 (two) times daily before a meal.  . Latanoprostene Bunod (VYZULTA) 0.024 % SOLN Place 1 drop into both eyes daily.  Marland Kitchen levothyroxine (SYNTHROID) 88 MCG tablet Take 1 tablet (88 mcg total) by mouth daily.  Marland Kitchen lisinopril (ZESTRIL) 2.5 MG tablet Take 1 tablet (2.5 mg total) by mouth daily.  . Magnesium Oxide 500 MG CAPS Take 1  capsule by mouth 2 (two) times daily.  . metFORMIN (GLUCOPHAGE-XR) 500 MG 24 hr tablet TAKE 2 TABLETS TWICE A DAY  . metoprolol succinate (TOPROL-XL) 100 MG 24 hr tablet TAKE 1 TABLET TWICE A DAY AND AN EXTRA ONE-HALF (1/2) TABLET DAILY AS NEEDED FOR ATRIAL FIBRILLATION  . timolol (BETIMOL) 0.25 % ophthalmic solution 1-2 drops daily.  Alveda Reasons 20 MG TABS tablet TAKE 1 TABLET DAILY WITH SUPPER   No facility-administered encounter medications on file as of 05/21/2020.    Recent Relevant Labs: Lab Results  Component Value Date/Time   HGBA1C 7.2 (H) 03/25/2020 10:22 AM   HGBA1C 7.6 (H) 09/26/2019 11:02 AM   MICROALBUR 28.6 (H) 03/25/2020 10:22 AM   MICROALBUR 22.8 (H) 12/05/2018 10:29 AM    Kidney Function Lab Results  Component Value Date/Time   CREATININE 0.93 05/19/2020 09:48 AM   CREATININE 0.92 03/25/2020 10:22 AM   CREATININE 1.10 09/26/2019 11:02 AM   CREATININE 1.14 07/28/2015 04:17 PM   GFR 81.41 05/19/2020 09:48 AM   GFRNONAA 67 09/26/2019 11:02 AM   GFRAA 77 09/26/2019 11:02 AM    . Current antihyperglycemic regimen:  Metformin 500 mg 1 cap twice daily,Jardiance 25 mg 1 tab daily, glipizide 5 mg 1 tab twice daily and Januvia 100 mg daily (patient states he has 1 month left of Januvia and will switch to Rybelsus)  . What recent interventions/DTPs have been made to improve glycemic control: Patient will start Rybelsus  . Have there been any recent hospitalizations or ED visits since last visit with CPP?  Patient was seen at urgent care for sob  in December  . Patient denies any low readings and hypoglycemic symptoms  . Patient denies any high readings and hyperglycemic symptoms  . How often are you checking your blood sugar? Patient states that he checks blood sugar first thing in the morning  . What are your blood sugars ranging? Blood sugars range between 100-135 o Fasting:  o Before meals: This morning 112 o After meals:  o Bedtime:   . During the week, how often  does your blood glucose drop below 70? Patient states no readings below 70  . Are you checking your feet daily/regularly? No issues with feet  Patient does states that he just started having eczema and is not sure if it is from one of his medications. He did say that he wad prescribed diprolene for eczema but it cost $100. He said that he looked at another cream called triamcinolone that is $4, he wants to know if which one is better or are they both the same.   Adherence Review: Is the patient currently on a STATIN medication? Atorvastatin Is the patient currently on ACE/ARB medication? Lisinopril Does the patient have >5 day gap between last estimated fill dates? Yes   Ethelene Hal Clinical Pharmacist Assistant [phone]  Time spent:40

## 2020-05-25 ENCOUNTER — Other Ambulatory Visit: Payer: Self-pay

## 2020-05-25 NOTE — Progress Notes (Addendum)
Made a call to the patient this morning and suggest that he try triamcinolone cream, per Mendel Ryder the clinical pharmacist. Patient stated that he has an appointment with Dr. Quay Burow tomorrow and asked if she could write him a prescription for this medication. I told the patient that I would pass along to [place].  [place] Pharmacist Assistant [phone]  Time spent:6

## 2020-05-25 NOTE — Progress Notes (Signed)
Subjective:    Patient ID: [name], male    DOB: 17-Feb-1945, 75 y.o.   MRN: [id number]  HPI The patient is here for follow up of their chronic medical problems, including htn, DM  We reviewed his most recent blood work.  He had questions regarding his medications and labs.  he is active, but not exercising regularly.  He feels he is eating fairly well.    Medications and allergies reviewed with patient and updated if appropriate.  Patient Active Problem List   Diagnosis Date Noted  . Eczema 05/26/2020  . Type 2 diabetes mellitus with microalbuminuria, without long-term current use of insulin ([place]) 03/30/2020  . Bilateral inguinal hernia 03/25/2020  . Aortic atherosclerosis ([place]) 10/30/2019  . Nephrolithiasis 10/30/2019  . Type 2 diabetes mellitus with hyperglycemia, without long-term current use of insulin ([place]) 10/28/2019  . LLQ pain 09/26/2019  . Eustachian tube dysfunction, right 06/03/2019  . Diabetes mellitus with peripheral circulatory disorder, controlled ([place]) 11/09/2016  . Lumbar radiculopathy 05/05/2015  . Atypical atrial flutter ([place]) 09/12/2014  . Hypothyroidism 12/30/2010  . OSA (obstructive sleep apnea) 09/02/2010  . Atrial fibrillation ([place]) 04/05/2009  . ERECTILE DYSFUNCTION 05/01/2008  . Hyperlipidemia 09/06/2007  . Hypertension 09/06/2007  . BPH with obstruction/lower urinary tract symptoms 04/25/2007    Current Outpatient Medications on File Prior to Visit  Medication Sig Dispense Refill  . amLODipine (NORVASC) 10 MG tablet Take 1 tablet (10 mg total) by mouth daily. 90 tablet 1  . atorvastatin (LIPITOR) 10 MG tablet Take 1 tablet (10 mg total) by mouth daily. 90 tablet 1  . brimonidine (ALPHAGAN) 0.2 % ophthalmic solution 1 drop 2 (two) times daily.    . empagliflozin (JARDIANCE) 25 MG TABS tablet Take 1 tablet (25 mg total) by mouth daily before breakfast. 90 tablet 3  . finasteride (PROPECIA) 1 MG tablet Take 0.25 mg by mouth daily.     Marland Kitchen glipiZIDE (GLUCOTROL) 5 MG tablet Take 1 tablet (5 mg total) by mouth 2 (two) times daily before a meal. 180 tablet 3  . Latanoprostene Bunod (VYZULTA) 0.024 % SOLN Place 1 drop into both eyes daily.    Marland Kitchen levothyroxine (SYNTHROID) 88 MCG tablet Take 1 tablet (88 mcg total) by mouth daily. 90 tablet 3  . lisinopril (ZESTRIL) 2.5 MG tablet Take 1 tablet (2.5 mg total) by mouth daily. 90 tablet 1  . Magnesium Oxide 500 MG CAPS Take 1 capsule by mouth 2 (two) times daily.    . metFORMIN (GLUCOPHAGE-XR) 500 MG 24 hr tablet TAKE 2 TABLETS TWICE A DAY 360 tablet 3  . metoprolol succinate (TOPROL-XL) 100 MG 24 hr tablet TAKE 1 TABLET TWICE A DAY AND AN EXTRA ONE-HALF (1/2) TABLET DAILY AS NEEDED FOR ATRIAL FIBRILLATION 225 tablet 1  . timolol (BETIMOL) 0.25 % ophthalmic solution 1-2 drops daily.    Alveda Reasons 20 MG TABS tablet TAKE 1 TABLET DAILY WITH SUPPER 90 tablet 3   No current facility-administered medications on file prior to visit.    Past Medical History:  Diagnosis Date  . AF (atrial fibrillation) (HCC)    persistent afib, s/p afib ablation x 2 with subsequent convergent procedure at [facility]  . Allergic rhinitis    seasonal  . Atrial flutter ([place])   . CTS (carpal tunnel syndrome)    PMH of  . Diabetes mellitus    type 2  . External hemorrhoids   . Glaucoma   . Hyperlipidemia   . Hypertension   .  Hypothyroidism   . Renal calculus 2005   [facility]    Past Surgical History:  Procedure Laterality Date  . ABLATION  06/2013   atypical atrial flutter ablation by Dr Lehman Prom at [facility] (mitral annular flutter ablated)  . afib 09/22/2008--11/03/2009     afib ablation x 2 by Dr Rayann Heman with subsequent convergent procedure at [facility]  . CARDIAC CATHETERIZATION  2005   non obstructive CAD  . CARDIOVERSION    . CARDIOVERSION  12/09/2010   Procedure: CARDIOVERSION;  Surgeon: Loralie Champagne, MD;  Location: [place];  Service: Cardiovascular;  Laterality: N/A;  . CARDIOVERSION  09/15/2011    Procedure: CARDIOVERSION;  Surgeon: Josue Hector, MD;  Location: [place];  Service: Cardiovascular;  Laterality: N/A;  office drawing labs wed 8/28  . CARDIOVERSION N/A 07/18/2012   Procedure: CARDIOVERSION;  Surgeon: Thayer Headings, MD;  Location: [place];  Service: Cardiovascular;  Laterality: N/A;  . CARDIOVERSION  08/2014   [place]  . COLONOSCOPY  2009   Dr Sharlett Iles  . convergent procedure  2013   at [facility] for a fib  . JOINT REPLACEMENT Right 2008, 2012  . LOOP RECORDER IMPLANT Left 12-2010  . TONSILLECTOMY    . TOTAL HIP ARTHROPLASTY  06/2010   Depuy prosthesis replacement, Dr Alvan Dame  . TOTAL HIP ARTHROPLASTY Right 2008    Depuy;Dr Alvan Dame    Social History   Socioeconomic History  . Marital status: Married    Spouse name: Izora Gala  . Number of children: 2  . Years of education: Not on file  . Highest education level: Not on file  Occupational History  . 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  Tobacco Use  . Smoking status: Former Smoker    Packs/day: 0.90    Years: 8.00    Pack years: 7.20    Types: Cigarettes    Quit date: 01/16/1974    Years since quitting: 46.3  . Smokeless tobacco: Never Used  . Tobacco comment: smoked  1966- 1976,up to 1 ppd  Vaping Use  . Vaping Use: Never used  Substance and Sexual Activity  . Alcohol use: Yes    Comment: socially  . Drug use: No  . Sexual activity: Not on file  Other Topics Concern  . Not on file  Social History Narrative  . Not on file   Social Determinants of Health   Financial Resource Strain: Low Risk   . Difficulty of Paying Living Expenses: Not hard at all  Food Insecurity: No Food Insecurity  . Worried About Charity fundraiser in the Last Year: Never true  . Ran Out of Food in the Last Year: Never true  Transportation Needs: No Transportation Needs  . Lack of Transportation (Medical): No  . Lack of Transportation (Non-Medical): No  Physical Activity: Sufficiently Active  . Days  of Exercise per Week: 3 days  . Minutes of Exercise per Session: 60 min  Stress: No Stress Concern Present  . Feeling of Stress : Not at all  Social Connections: Moderately Isolated  . Frequency of Communication with Friends and Family: More than three times a week  . Frequency of Social Gatherings with Friends and Family: Never  . Attends Religious Services: Never  . Active Member of Clubs or Organizations: No  . Attends Archivist Meetings: Never  . Marital Status: Married    Family History  Problem Relation Age of Onset  . Diabetes Father   .  Stroke Father        mid 58s  . Lung cancer Mother        non smoker  . Stroke Mother 78  . Testicular cancer Brother   . Allergies Maternal Grandmother   . Cancer Maternal Grandmother        unsure  . Allergies Brother   . Heart attack Neg Hx     Review of Systems  Constitutional: Negative for chills and fever.  Respiratory: Negative for cough, shortness of breath and wheezing.   Cardiovascular: Negative for chest pain, palpitations and leg swelling.  Neurological: Negative for light-headedness and headaches.       Objective:   Vitals:   05/26/20 0932  BP: 140/70  Pulse: 70  Temp: 98.1 F (36.7 C)  SpO2: 97%   BP Readings from Last 3 Encounters:  05/26/20 140/70  04/07/20 132/68  03/30/20 134/76   Wt Readings from Last 3 Encounters:  05/26/20 183 lb 9.6 oz (83.3 kg)  04/07/20 184 lb 12.8 oz (83.8 kg)  03/30/20 185 lb 8 oz (84.1 kg)   Body mass index is 27.92 kg/m.   Physical Exam    Constitutional: Appears well-developed and well-nourished. No distress.  HENT:  Head: Normocephalic and atraumatic.  Neck: Neck supple. No tracheal deviation present. No thyromegaly present.  No cervical lymphadenopathy Cardiovascular: Normal rate, regular rhythm and normal heart sounds.   No murmur heard. No carotid bruit .  No edema Pulmonary/Chest: Effort normal and breath sounds normal. No respiratory distress. No  has no wheezes. No rales.  Skin: Skin is warm and dry. Not diaphoretic.  Psychiatric: Normal mood and affect. Behavior is normal.      Assessment & Plan:    See Problem List for Assessment and Plan of chronic medical problems.    This visit occurred during the SARS-CoV-2 public health emergency.  Safety protocols were in place, including screening questions prior to the visit, additional usage of staff PPE, and extensive cleaning of exam room while observing appropriate contact time as indicated for disinfecting solutions.

## 2020-05-26 ENCOUNTER — Ambulatory Visit (INDEPENDENT_AMBULATORY_CARE_PROVIDER_SITE_OTHER): Payer: Medicare Other | Admitting: Internal Medicine

## 2020-05-26 ENCOUNTER — Encounter: Payer: Self-pay | Admitting: Internal Medicine

## 2020-05-26 VITALS — BP 140/70 | HR 70 | Temp 98.1°F | Ht 68.0 in | Wt 183.6 lb

## 2020-05-26 DIAGNOSIS — E1129 Type 2 diabetes mellitus with other diabetic kidney complication: Secondary | ICD-10-CM

## 2020-05-26 DIAGNOSIS — L309 Dermatitis, unspecified: Secondary | ICD-10-CM | POA: Diagnosis not present

## 2020-05-26 DIAGNOSIS — R809 Proteinuria, unspecified: Secondary | ICD-10-CM

## 2020-05-26 DIAGNOSIS — I10 Essential (primary) hypertension: Secondary | ICD-10-CM

## 2020-05-26 DIAGNOSIS — E039 Hypothyroidism, unspecified: Secondary | ICD-10-CM

## 2020-05-26 MED ORDER — TRIAMCINOLONE ACETONIDE 0.5 % EX CREA: 1.0000 "application " | TOPICAL_CREAM | Freq: Two times a day (BID) | CUTANEOUS | 2 refills | Status: DC

## 2020-05-26 NOTE — Patient Instructions (Addendum)
   Medications changes include :  none     Please followup as scheduled

## 2020-05-26 NOTE — Assessment & Plan Note (Signed)
Chronic BP Readings from Last 3 Encounters:  05/26/20 140/70  04/07/20 132/68  03/30/20 134/76   Blood pressure adequately controlled Continue grams daily, lisinopril 2.5 mg daily, metoprolol 100 mg XL daily

## 2020-05-26 NOTE — Assessment & Plan Note (Addendum)
Chronic Has seen derm  Needs steroid cream that is affordable-the one that dermatology prescribed was too expensive Triamcinolone 0.5% cream sent to pharmacy

## 2020-05-26 NOTE — Assessment & Plan Note (Addendum)
Chronic Following with endocrine Taking metformin milligrams XR twice daily, glipizide 5 mg twice daily, Jardiance 25 mg daily Currently also taking Januvia and once he completes what he has at home he will start Rybelsus 3 mg daily which can be adjusted if tolerated encouraged him to be as active as possible and maintain a diabetic diet  Started on lisinopril 2.5 mg daily secondary to microalbuminuria-we will recheck urine microalbumin level at his next visit

## 2020-05-26 NOTE — Assessment & Plan Note (Signed)
Chronic Most recent TSH in normal range after adjusting medication dose Continue levothyroxine 88 mcg daily-we will recheck TSH at his next visit

## 2020-06-22 ENCOUNTER — Other Ambulatory Visit: Payer: Self-pay

## 2020-06-22 ENCOUNTER — Encounter: Payer: Self-pay | Admitting: Internal Medicine

## 2020-06-22 MED ORDER — RIVAROXABAN 20 MG PO TABS: ORAL_TABLET | ORAL | 3 refills | Status: DC

## 2020-06-22 NOTE — Telephone Encounter (Signed)
Refilled medication

## 2020-06-23 ENCOUNTER — Other Ambulatory Visit: Payer: Self-pay | Admitting: Internal Medicine

## 2020-06-27 ENCOUNTER — Other Ambulatory Visit: Payer: Self-pay | Admitting: Endocrinology

## 2020-07-29 ENCOUNTER — Telehealth: Payer: Self-pay | Admitting: Pharmacist

## 2020-07-29 NOTE — Progress Notes (Signed)
Chronic Care Management Pharmacy Assistant   Name: [name]  MRN: [id number] DOB: 01-23-1945   Reason for Encounter: Disease State   Conditions to be addressed/monitored: DMII   Recent office visits:  05/26/20 Dr. Quay Burow Internal Medicine, Medication ordered Triamcinolone 0.5 % cream  Recent consult visits:  None ID  Hospital visits:  None in previous 6 months  Medications: Outpatient Encounter Medications as of 07/29/2020  Medication Sig   amLODipine (NORVASC) 10 MG tablet Take 1 tablet (10 mg total) by mouth daily.   atorvastatin (LIPITOR) 10 MG tablet Take 1 tablet (10 mg total) by mouth daily.   brimonidine (ALPHAGAN) 0.2 % ophthalmic solution 1 drop 2 (two) times daily.   finasteride (PROPECIA) 1 MG tablet Take 0.25 mg by mouth daily.   glipiZIDE (GLUCOTROL) 5 MG tablet TAKE 1 TABLET (5 MG TOTAL) BY MOUTH 2 (TWO) TIMES DAILY BEFORE A MEAL.   JARDIANCE 25 MG TABS tablet TAKE 1 TABLET DAILY BEFORE BREAKFAST.   Latanoprostene Bunod (VYZULTA) 0.024 % SOLN Place 1 drop into both eyes daily.   levothyroxine (SYNTHROID) 88 MCG tablet Take 1 tablet (88 mcg total) by mouth daily.   lisinopril (ZESTRIL) 2.5 MG tablet Take 1 tablet (2.5 mg total) by mouth daily.   Magnesium Oxide 500 MG CAPS Take 1 capsule by mouth 2 (two) times daily.   metFORMIN (GLUCOPHAGE-XR) 500 MG 24 hr tablet TAKE 2 TABLETS TWICE A DAY   metoprolol succinate (TOPROL-XL) 100 MG 24 hr tablet TAKE 1 TABLET TWICE A DAY AND AN EXTRA ONE-HALF (1/2) TABLET DAILY AS NEEDED FOR ATRIAL FIBRILLATION   rivaroxaban (XARELTO) 20 MG TABS tablet TAKE 1 TABLET DAILY WITH SUPPER   timolol (BETIMOL) 0.25 % ophthalmic solution 1-2 drops daily.   triamcinolone cream (KENALOG) 0.5 % Apply 1 application topically 2 (two) times daily.   No facility-administered encounter medications on file as of 07/29/2020.    Pharmacist Review Recent Relevant Labs: Lab Results  Component Value Date/Time   HGBA1C 7.2 (H) 03/25/2020  10:22 AM   HGBA1C 7.6 (H) 09/26/2019 11:02 AM   MICROALBUR 28.6 (H) 03/25/2020 10:22 AM   MICROALBUR 22.8 (H) 12/05/2018 10:29 AM    Kidney Function Lab Results  Component Value Date/Time   CREATININE 0.93 05/19/2020 09:48 AM   CREATININE 0.92 03/25/2020 10:22 AM   CREATININE 1.10 09/26/2019 11:02 AM   CREATININE 1.14 07/28/2015 04:17 PM   GFR 81.41 05/19/2020 09:48 AM   GFRNONAA 67 09/26/2019 11:02 AM   GFRAA 77 09/26/2019 11:02 AM    Current antihyperglycemic regimen:  Glipizide 5 mg 1 tab twice daily Metformin 500 mg 2 tabs twice daily Rybelsus   What recent interventions/DTPs have been made to improve glycemic control:  encouraged him to be as active as possible and maintain a diabetic diet  Have there been any recent hospitalizations or ED visits since last visit with CPP? No  Patient denies hypoglycemic symptoms, including None Patient denies hyperglycemic symptoms, including none  How often are you checking your blood sugar? once daily  What are your blood sugars ranging? Patient states that his blood sugars are up and down even after starting the Rybelsus. He would like a follow up call 08/11/20 after he has his A1c checked next week. Also would like to talk to [place] bout the Rybelsus after labs.  Fasting: 135 this morning Before meals: NA  After meals: NA Bedtime: NA  During the week, how often does your blood glucose drop below 70? Never  Are you checking your feet daily/regularly? Patient is not having any issues with feet  Adherence Review: Is the patient currently on a STATIN medication? Yes Is the patient currently on ACE/ARB medication? Yes Does the patient have >5 day gap between last estimated fill dates? No    Star Rating Drugs: Lisinopril 06/22/20 90 ds  [place] Pharmacist Assistant [phone]   Time spent:21

## 2020-08-03 ENCOUNTER — Encounter: Payer: Self-pay | Admitting: Internal Medicine

## 2020-08-03 ENCOUNTER — Other Ambulatory Visit: Payer: Self-pay

## 2020-08-03 ENCOUNTER — Ambulatory Visit (INDEPENDENT_AMBULATORY_CARE_PROVIDER_SITE_OTHER): Payer: Medicare Other | Admitting: Internal Medicine

## 2020-08-03 VITALS — BP 160/82 | HR 82 | Ht 68.0 in | Wt 180.0 lb

## 2020-08-03 DIAGNOSIS — E1165 Type 2 diabetes mellitus with hyperglycemia: Secondary | ICD-10-CM | POA: Diagnosis not present

## 2020-08-03 DIAGNOSIS — R809 Proteinuria, unspecified: Secondary | ICD-10-CM

## 2020-08-03 DIAGNOSIS — E1129 Type 2 diabetes mellitus with other diabetic kidney complication: Secondary | ICD-10-CM

## 2020-08-03 LAB — POCT GLYCOSYLATED HEMOGLOBIN (HGB A1C): Hemoglobin A1C: 7.5 % — AB (ref 4.0–5.6)

## 2020-08-03 MED ORDER — RYBELSUS 14 MG PO TABS: 14.0000 mg | ORAL_TABLET | Freq: Every day | ORAL | 6 refills | Status: DC

## 2020-08-03 NOTE — Progress Notes (Signed)
Name: [name]  Age/ Sex: 75 y.o., male   MRN/ DOB: [id number], [birth year]     PCP: Binnie Rail, MD   Reason for Endocrinology Evaluation: Type 2 Diabetes Mellitus  Initial Endocrine Consultative Visit: 10/28/2019    PATIENT IDENTIFIER: [name] is a 75 y.o. male with a past medical history of T2Dm and Dyslipidemia.  The patient has followed with Endocrinology clinic since 10/28/2019 for consultative assistance with management of his diabetes.  DIABETIC HISTORY:  [name] was diagnosed with DM many years ago.Farxiga- dry mouth and frequency. His hemoglobin A1c has ranged from 6.5% in 2015, peaking at 8.5% in 2018.    On his initial visit to our clinic his A1c was 7.6% He had concerns about the cost of Jardiance, [place].    He was on Glimepiride and Metformin and we switched Glimepiride to Glipizide   SUBJECTIVE:   During the last visit (03/30/2020): A1c 7.2 % Increased Jardiance, switched Januvia to Rybelsus , continued Metformin and Glipizide       Today (08/03/2020): [name] is here for a follow up on diabetes management.  He checks his blood sugars 1 times daily.  The patient has not had hypoglycemic episodes since the last clinic visit.  He did not bring his meter today   He denies nausea and diarrhea    Rybelsus started around early 06/2020  HOME DIABETES REGIMEN:  Glipizide 5 mg, 1 tablet BID Metformin 500 mg , 2 tablets twice a day  Jardiance 25 mg daily  Rybelsus 3 mg daily - two tablets a day     Statin: Yes ACE-I/ARB: no    METER DOWNLOAD SUMMARY:  104 - 858   DIABETIC COMPLICATIONS: Microvascular complications:   Denies: CKD, retinopathy, neuropathy Last Eye Exam: Completed 2021  Macrovascular complications:   Denies: CAD, CVA, PVD   HISTORY:  Past Medical History:  Past Medical History:  Diagnosis Date   AF (atrial fibrillation) ([place])    persistent afib, s/p afib ablation x 2 with  subsequent convergent procedure at [facility]   Allergic rhinitis    seasonal   Atrial flutter ([place])    CTS (carpal tunnel syndrome)    PMH of   Diabetes mellitus    type 2   External hemorrhoids    Glaucoma    Hyperlipidemia    Hypertension    Hypothyroidism    Renal calculus 2005   [facility]   Past Surgical History:  Past Surgical History:  Procedure Laterality Date   ABLATION  06/2013   atypical atrial flutter ablation by Dr Lehman Prom at [facility] (mitral annular flutter ablated)   afib 09/22/2008--11/03/2009     afib ablation x 2 by Dr Rayann Heman with subsequent convergent procedure at [place]  2005   non obstructive CAD   CARDIOVERSION     CARDIOVERSION  12/09/2010   Procedure: CARDIOVERSION;  Surgeon: Loralie Champagne, MD;  Location: [place];  Service: Cardiovascular;  Laterality: N/A;   CARDIOVERSION  09/15/2011   Procedure: CARDIOVERSION;  Surgeon: Josue Hector, MD;  Location: [facility] ENDOSCOPY;  Service: Cardiovascular;  Laterality: N/A;  office drawing labs wed 8/28   CARDIOVERSION N/A 07/18/2012   Procedure: CARDIOVERSION;  Surgeon: Thayer Headings, MD;  Location: [facility] ENDOSCOPY;  Service: Cardiovascular;  Laterality: N/A;   CARDIOVERSION  08/2014   [place]   COLONOSCOPY  2009   Dr Sharlett Iles   convergent procedure  2013   at [facility] for  a fib   JOINT REPLACEMENT Right 2008, 2012   LOOP RECORDER IMPLANT Left 12-2010   TONSILLECTOMY     TOTAL HIP ARTHROPLASTY  06/2010   Depuy prosthesis replacement, Dr Alvan Dame   TOTAL HIP ARTHROPLASTY Right 2008    Depuy;Dr Alvan Dame   Social History:  reports that he quit smoking about 46 years ago. His smoking use included cigarettes. He has a 7.20 pack-year smoking history. He has never used smokeless tobacco. He reports current alcohol use. He reports that he does not use drugs. Family History:  Family History  Problem Relation Age of Onset   Diabetes Father    Stroke Father        mid 82s   Lung cancer Mother        non smoker    Stroke Mother 48   Testicular cancer Brother    Allergies Maternal Grandmother    Cancer Maternal Grandmother        unsure   Allergies Brother    Heart attack Neg Hx      HOME MEDICATIONS: Allergies as of 08/03/2020       Reactions   Farxiga [dapagliflozin] Other (See Comments)   Dry mouth, increased urination, increased appetite        Medication List        Accurate as of August 03, 2020  9:59 AM. If you have any questions, ask your nurse or doctor.          amLODipine 10 MG tablet Commonly known as: NORVASC Take 1 tablet (10 mg total) by mouth daily.   atorvastatin 10 MG tablet Commonly known as: LIPITOR Take 1 tablet (10 mg total) by mouth daily.   brimonidine 0.2 % ophthalmic solution Commonly known as: ALPHAGAN 1 drop 2 (two) times daily.   finasteride 1 MG tablet Commonly known as: PROPECIA Take 0.25 mg by mouth daily.   glipiZIDE 5 MG tablet Commonly known as: GLUCOTROL TAKE 1 TABLET (5 MG TOTAL) BY MOUTH 2 (TWO) TIMES DAILY BEFORE A MEAL.   Jardiance 25 MG Tabs tablet Generic drug: empagliflozin TAKE 1 TABLET DAILY BEFORE BREAKFAST.   levothyroxine 88 MCG tablet Commonly known as: SYNTHROID Take 1 tablet (88 mcg total) by mouth daily.   lisinopril 2.5 MG tablet Commonly known as: Zestril Take 1 tablet (2.5 mg total) by mouth daily.   Magnesium Oxide 500 MG Caps Take 1 capsule by mouth 2 (two) times daily.   metFORMIN 500 MG 24 hr tablet Commonly known as: GLUCOPHAGE-XR TAKE 2 TABLETS TWICE A DAY   metoprolol succinate 100 MG 24 hr tablet Commonly known as: TOPROL-XL TAKE 1 TABLET TWICE A DAY AND AN EXTRA ONE-HALF (1/2) TABLET DAILY AS NEEDED FOR ATRIAL FIBRILLATION   rivaroxaban 20 MG Tabs tablet Commonly known as: Xarelto TAKE 1 TABLET DAILY WITH SUPPER   timolol 0.25 % ophthalmic solution Commonly known as: BETIMOL 1-2 drops daily.   triamcinolone cream 0.5 % Commonly known as: KENALOG Apply 1 application topically 2 (two)  times daily.   Vyzulta 0.024 % Soln Generic drug: Latanoprostene Bunod Place 1 drop into both eyes daily.         OBJECTIVE:   Vital Signs: BP (!) 160/82   Pulse 82   Ht 5\' 8"  (1.727 m)   Wt 180 lb (81.6 kg)   SpO2 99%   BMI 27.37 kg/m   Wt Readings from Last 3 Encounters:  08/03/20 180 lb (81.6 kg)  05/26/20 183 lb 9.6 oz (83.3 kg)  04/07/20 184 lb 12.8 oz (83.8 kg)     Exam: General: Pt appears well and is in NAD  Lungs: Clear with good BS bilat   Heart: RRR   Extremities: No pretibial edema.   Neuro: MS is good with appropriate affect, pt is alert and Ox3     DATA REVIEWED:  Lab Results  Component Value Date   HGBA1C 7.5 (A) 08/03/2020   HGBA1C 7.2 (H) 03/25/2020   HGBA1C 7.6 (H) 09/26/2019   Lab Results  Component Value Date   MICROALBUR 28.6 (H) 03/25/2020   LDLCALC 33 03/25/2020   CREATININE 0.93 05/19/2020   Lab Results  Component Value Date   MICRALBCREAT 53.3 (H) 03/25/2020     Lab Results  Component Value Date   CHOL 105 03/25/2020   HDL 49.40 03/25/2020   LDLCALC 33 03/25/2020   LDLDIRECT 91.0 08/23/2016   TRIG 115.0 03/25/2020   CHOLHDL 2 03/25/2020         ASSESSMENT / PLAN / RECOMMENDATIONS:   1) Type 2 Diabetes Mellitus, Sub Optimally controlled, With Microalbuminuria - Most recent A1c of 7.5 %. Goal A1c < 7.0 %.      - A1c slightly increased, this is multifactorial , given we stopped 100 mg of Januvia and started with Rybelsus 3 mg and also he stopped exercising as his wife just had a knee sx  - He is motivated to start exercising again - Will increase Rybelsus as below, so far no side effects  - He believes he is in the donut hole , Pt assistance provided   MEDICATIONS: - Continue Glipizide 5 mg, 1 tablet before Breakfast and 1 tablet before  - Continue Metformin 500 mg , 2 tablets twice a day  - Continue   jardiance 25 mg, 1 tablet with Breakfast  - Increase Rybelsus 14 mg daily    EDUCATION / INSTRUCTIONS: BG  monitoring instructions: Patient is instructed to check his blood sugars 1 times a day, fasting  Call [facility] if: BG persistently < 70  I reviewed the Rule of 15 for the treatment of hypoglycemia in detail with the patient. Literature supplied.  2) Diabetic complications:  Eye: Does not have known diabetic retinopathy.  Neuro/ Feet: Does not have known diabetic peripheral neuropathy .  Renal: Patient does not have known baseline CKD. He   is  on an ACEI/ARB at present.      F/U in 3 months    Signed electronically by: Mack Guise, MD  [facility]  [facility] [address] [place],  [address] Phone: [phone] FAX: [phone]   CC: Binnie Rail, MD [place] [address] Phone: [phone]  Fax: [phone]  Return to Endocrinology clinic as below: Future Appointments  Date Time Provider [place]  08/30/2020  1:00 PM [facility] CCM PHARMACIST [facility] None  09/23/2020  9:30 AM Binnie Rail, MD [facility] None  01/26/2021  8:45 AM Hayden Pedro, MD [facility] None

## 2020-08-03 NOTE — Patient Instructions (Signed)
-   Glipizide 5 mg, 1 tablet before Breakfast and 1 tablet before  - Metformin 500 mg , 2 tablets twice a day  - Increase Rybelsus to 14 mg daily  - Continue  Jardiance 25 mg, 1 tablet with Breakfast    HOW TO TREAT LOW BLOOD SUGARS (Blood sugar LESS THAN 70 MG/DL) Please follow the RULE OF 15 for the treatment of hypoglycemia treatment (when your (blood sugars are less than 70 mg/dL)   STEP 1: Take 15 grams of carbohydrates when your blood sugar is low, which includes:  3-4 GLUCOSE TABS  OR 3-4 OZ OF JUICE OR REGULAR SODA OR ONE TUBE OF GLUCOSE GEL    STEP 2: RECHECK blood sugar in 15 MINUTES STEP 3: If your blood sugar is still low at the 15 minute recheck --> then, go back to STEP 1 and treat AGAIN with another 15 grams of carbohydrates.

## 2020-08-05 DIAGNOSIS — H4052X1 Glaucoma secondary to other eye disorders, left eye, mild stage: Secondary | ICD-10-CM | POA: Diagnosis not present

## 2020-08-05 DIAGNOSIS — H401111 Primary open-angle glaucoma, right eye, mild stage: Secondary | ICD-10-CM | POA: Diagnosis not present

## 2020-08-08 ENCOUNTER — Other Ambulatory Visit: Payer: Self-pay | Admitting: Internal Medicine

## 2020-08-12 DIAGNOSIS — H4052X1 Glaucoma secondary to other eye disorders, left eye, mild stage: Secondary | ICD-10-CM | POA: Diagnosis not present

## 2020-08-12 DIAGNOSIS — H401111 Primary open-angle glaucoma, right eye, mild stage: Secondary | ICD-10-CM | POA: Diagnosis not present

## 2020-08-17 ENCOUNTER — Encounter: Payer: Self-pay | Admitting: Internal Medicine

## 2020-08-17 ENCOUNTER — Other Ambulatory Visit: Payer: Self-pay

## 2020-08-17 ENCOUNTER — Ambulatory Visit (INDEPENDENT_AMBULATORY_CARE_PROVIDER_SITE_OTHER): Payer: Medicare Other | Admitting: Pharmacist

## 2020-08-17 DIAGNOSIS — I4891 Unspecified atrial fibrillation: Secondary | ICD-10-CM

## 2020-08-17 DIAGNOSIS — R809 Proteinuria, unspecified: Secondary | ICD-10-CM | POA: Diagnosis not present

## 2020-08-17 DIAGNOSIS — E1129 Type 2 diabetes mellitus with other diabetic kidney complication: Secondary | ICD-10-CM | POA: Diagnosis not present

## 2020-08-17 DIAGNOSIS — I10 Essential (primary) hypertension: Secondary | ICD-10-CM | POA: Diagnosis not present

## 2020-08-17 MED ORDER — AMLODIPINE BESYLATE 10 MG PO TABS: 10.0000 mg | ORAL_TABLET | Freq: Every day | ORAL | 0 refills | Status: DC

## 2020-08-17 MED ORDER — ATORVASTATIN CALCIUM 10 MG PO TABS: 10.0000 mg | ORAL_TABLET | Freq: Every day | ORAL | 0 refills | Status: DC

## 2020-08-17 NOTE — Progress Notes (Signed)
Chronic Care Management Pharmacy Note  08/19/2020 Name:  [name] MRN:  [id number] DOB:  1945/02/16  Summary: -Pt just started Rybelsus 14 mg this week -Pt is in donut hole now and brand name meds are expensive; however he still works so his income is too high to qualify for pt assistance  Recommendations/Changes made from today's visit: -Counseled on potential for low sugars due to glipizide; counseled on prevention and treatment of hypoglycemia; advised to contact prescriber if he experiences any low sugars < 70 -Advised to call Medicare to estimate when he will leave donut hole and enter catastrophic coverage   Subjective: [name] is an 75 y.o. year old male who is a primary patient of Burns, Claudina Lick, MD.  The CCM team was consulted for assistance with disease management and care coordination needs.    Engaged with patient by telephone for follow up visit in response to provider referral for pharmacy case management and/or care coordination services.   Consent to Services:  The patient was given information about Chronic Care Management services, agreed to services, and gave verbal consent prior to initiation of services.  Please see initial visit note for detailed documentation.   Patient Care Team: Binnie Rail, MD as PCP - General (Internal Medicine) Thompson Grayer, MD as PCP - Cardiology (Cardiology) Juluis Rainier as Consulting Physician (Optometry) Cyrus, Cleaster Corin, [facility] as Pharmacist (Pharmacist)  Social:  Patient lives at home with wife. He has been travelling a lot the last 6 months (vacations, visiting family, etc). He notes his income is too high for patient assistance.  Recent office visits: 05/26/20 Dr Quay Burow OV: chronic f/u; no med changes  09/26/19 Dr Quay Burow OV: chronic f/u, LLQ pain. Ordered KUB. Consider switching Januvia to Rybelsus. KUB showed small kidney stone, rx'd Hydrocodone and tamsulosin to help pass.  Recent consult  visits: 08/03/20 Dr Kelton Pillar (endocrine): increased Rybelsus to 14 mg  01/09/20 [facility]: covid positive, pt was told he is not candidate for infusion, pulse ox was 88 so came in for eval. spo2 97% in office. Referred for covid monitoring.  01/06/20 Dr Louis Meckel (urology): f/u kidney stones, opted for annual monitoring.  10/28/19 Dr Kelton Pillar (endocrine): switched glimepiride to glipizide. Consider stopping Januvia in the future. Consider Rybelsus.  Hospital visits: None in previous 6 months  Objective:  Lab Results  Component Value Date   CREATININE 0.93 05/19/2020   BUN 19 05/19/2020   GFR 81.41 05/19/2020   GFRNONAA 67 09/26/2019   GFRAA 77 09/26/2019   NA 139 05/19/2020   K 4.6 05/19/2020   CALCIUM 9.9 05/19/2020   CO2 27 05/19/2020    Lab Results  Component Value Date/Time   HGBA1C 7.5 (A) 08/03/2020 09:53 AM   HGBA1C 7.2 (H) 03/25/2020 10:22 AM   HGBA1C 7.6 (H) 09/26/2019 11:02 AM   GFR 81.41 05/19/2020 09:48 AM   GFR 82.56 03/25/2020 10:22 AM   MICROALBUR 28.6 (H) 03/25/2020 10:22 AM   MICROALBUR 22.8 (H) 12/05/2018 10:29 AM    Last diabetic Eye exam:  Lab Results  Component Value Date/Time   HMDIABEYEEXA Retinopathy (A) 01/07/2020 12:00 AM    Last diabetic Foot exam: No results found for: HMDIABFOOTEX   Lab Results  Component Value Date   CHOL 105 03/25/2020   HDL 49.40 03/25/2020   LDLCALC 33 03/25/2020   LDLDIRECT 91.0 08/23/2016   TRIG 115.0 03/25/2020   CHOLHDL 2 03/25/2020    Hepatic Function Latest Ref Rng & Units  03/25/2020 09/26/2019 04/03/2019  Total Protein 6.0 - 8.3 g/dL 7.8 7.2 7.5  Albumin 3.5 - 5.2 g/dL 4.4 - 4.4  AST 0 - 37 U/L 16 15 17   ALT 0 - 53 U/L 16 17 19   Alk Phosphatase 39 - 117 U/L 43 - 47  Total Bilirubin 0.2 - 1.2 mg/dL 0.6 0.8 0.6  Bilirubin, Direct 0.0 - 0.3 mg/dL - - -    Lab Results  Component Value Date/Time   TSH 1.93 05/19/2020 09:48 AM   TSH 5.58 (H) 03/25/2020 10:22 AM   FREET4 0.9 02/22/2009 02:23 PM    FREET4 1.0 05/08/2008 08:11 AM    CBC Latest Ref Rng & Units 03/25/2020 09/26/2019 09/04/2018  WBC 4.0 - 10.5 K/uL 7.2 7.0 7.1  Hemoglobin 13.0 - 17.0 g/dL 16.9 16.7 16.1  Hematocrit 39.0 - 52.0 % 50.1 50.5(H) 47.1  Platelets 150.0 - 400.0 K/uL 201.0 199 202.0    No results found for: VD25OH  Clinical ASCVD: Yes (aortic atherosclerosis) The ASCVD Risk score Mikey Bussing DC Jr., et al., 2013) failed to calculate for the following reasons:   The valid total cholesterol range is 130 to 320 mg/dL    Depression screen [facility] 2/9 11/03/2019 09/04/2018 09/04/2018  Decreased Interest 0 0 0  Down, Depressed, Hopeless 0 1 0  PHQ - 2 Score 0 1 0  Altered sleeping - - -  Tired, decreased energy - - -  Change in appetite - - -  Feeling bad or failure about yourself  - - -  Trouble concentrating - - -  Moving slowly or fidgety/restless - - -  Suicidal thoughts - - -  PHQ-9 Score - - -  Difficult doing work/chores - - -  Some recent data might be hidden    CHA2DS2-VASc Score = 4  The patient's score is based upon: CHF History: No HTN History: Yes Diabetes History: Yes Stroke History: No Vascular Disease History: Yes Age Score: 1 Gender Score: 0     Social History   Tobacco Use  Smoking Status Former   Packs/day: 0.90   Years: 8.00   Pack years: 7.20   Types: Cigarettes   Quit date: 01/16/1974   Years since quitting: 46.6  Smokeless Tobacco Never  Tobacco Comments   smoked  1966- 1976,up to 1 ppd   BP Readings from Last 3 Encounters:  08/03/20 (!) 160/82  05/26/20 140/70  04/07/20 132/68   Pulse Readings from Last 3 Encounters:  08/03/20 82  05/26/20 70  04/07/20 75   Wt Readings from Last 3 Encounters:  08/03/20 180 lb (81.6 kg)  05/26/20 183 lb 9.6 oz (83.3 kg)  04/07/20 184 lb 12.8 oz (83.8 kg)   BMI Readings from Last 3 Encounters:  08/03/20 27.37 kg/m  05/26/20 27.92 kg/m  04/07/20 28.10 kg/m    Assessment/Interventions: Review of patient past medical history,  allergies, medications, health status, including review of consultants reports, laboratory and other test data, was performed as part of comprehensive evaluation and provision of chronic care management services.   SDOH:  (Social Determinants of Health) assessments and interventions performed: Yes   CCM Care Plan  Allergies  Allergen Reactions   Farxiga [Dapagliflozin] Other (See Comments)    Dry mouth, increased urination, increased appetite    Medications Reviewed Today     Reviewed by Charlton Haws, [facility] (Pharmacist) on 08/17/20 at 1344  Med List Status: <None>   Medication Order Taking? Sig Documenting Provider Last Dose Status Informant  amLODipine (NORVASC)  10 MG tablet [id number] Yes Take 1 tablet (10 mg total) by mouth daily. Binnie Rail, MD Taking Active   atorvastatin (LIPITOR) 10 MG tablet [id number] Yes Take 1 tablet (10 mg total) by mouth daily. Binnie Rail, MD Taking Active   brimonidine [facility]) 0.2 % ophthalmic solution [id number] Yes 1 drop 2 (two) times daily. [provider] Taking Active   finasteride (PROPECIA) 1 MG tablet [id number] Yes Take 0.25 mg by mouth daily. [provider] Taking Active   glipiZIDE (GLUCOTROL) 5 MG tablet [id number] Yes TAKE 1 TABLET (5 MG TOTAL) BY MOUTH 2 (TWO) TIMES DAILY BEFORE A MEAL. Shamleffer, Melanie Crazier, MD Taking Active   JARDIANCE 25 MG TABS tablet [id number] Yes TAKE 1 TABLET DAILY BEFORE BREAKFAST. Shamleffer, Melanie Crazier, MD Taking Active   Latanoprostene Bunod (VYZULTA) 0.024 % SOLN [id number] Yes Place 1 drop into both eyes daily. [provider] Taking Active   levothyroxine (SYNTHROID) 88 MCG tablet [id number] Yes Take 1 tablet (88 mcg total) by mouth daily. Binnie Rail, MD Taking Active   lisinopril (ZESTRIL) 2.5 MG tablet [id number] Yes TAKE 1 TABLET DAILY Binnie Rail, MD Taking Active   Magnesium Oxide 500 MG CAPS [id number] Yes Take 1 capsule by mouth 2 (two) times daily.  [provider] Taking Active   metFORMIN (GLUCOPHAGE-XR) 500 MG 24 hr tablet [id number] Yes TAKE 2 TABLETS TWICE A DAY Burns, Claudina Lick, MD Taking Active   metoprolol succinate (TOPROL-XL) 100 MG 24 hr tablet [id number] Yes TAKE 1 TABLET TWICE A DAY AND AN EXTRA ONE-HALF (1/2) TABLET DAILY AS NEEDED FOR ATRIAL FIBRILLATION Burns, Claudina Lick, MD Taking Active   rivaroxaban (XARELTO) 20 MG TABS tablet [id number] Yes TAKE 1 TABLET DAILY WITH SUPPER Burns, Claudina Lick, MD Taking Active   Semaglutide [facility]) 14 MG TABS [id number] Yes Take 14 mg by mouth daily. Shamleffer, Melanie Crazier, MD Taking Active   timolol (BETIMOL) 0.25 % ophthalmic solution [id number] Yes 1-2 drops daily. [provider] Taking Active   triamcinolone cream (KENALOG) 0.5 % [id number] Yes Apply 1 application topically 2 (two) times daily. Binnie Rail, MD Taking Active             Patient Active Problem List   Diagnosis Date Noted   Eczema 05/26/2020   Type 2 diabetes mellitus with microalbuminuria, without long-term current use of insulin ([place]) 03/30/2020   Bilateral inguinal hernia 03/25/2020   Aortic atherosclerosis ([place]) 10/30/2019   Nephrolithiasis 10/30/2019   Type 2 diabetes mellitus with hyperglycemia, without long-term current use of insulin ([place]) 10/28/2019   LLQ pain 09/26/2019   Eustachian tube dysfunction, right 06/03/2019   Diabetes mellitus with peripheral circulatory disorder, controlled ([place]) 11/09/2016   Lumbar radiculopathy 05/05/2015   Atypical atrial flutter ([place]) 09/12/2014   Hypothyroidism 12/30/2010   OSA (obstructive sleep apnea) 09/02/2010   Atrial fibrillation ([place]) 04/05/2009   ERECTILE DYSFUNCTION 05/01/2008   Hyperlipidemia 09/06/2007   Hypertension 09/06/2007   BPH with obstruction/lower urinary tract symptoms 04/25/2007    Immunization History  Administered Date(s) Administered   Fluad Quad(high Dose 65+) 09/02/2018   Hepatitis A 03/27/2000, 10/16/2000   Influenza  Whole 10/17/2011   Influenza, High Dose Seasonal PF 09/28/2015, 10/13/2016, 09/28/2017   Influenza,inj,Quad PF,6+ Mos 10/16/2012, 09/29/2014, 10/13/2016   Influenza-Unspecified 10/30/2013, 09/02/2018, 09/25/2019   PFIZER(Purple Top)SARS-COV-2 Vaccination 02/10/2019, 03/03/2019   Pneumococcal Conjugate-13 02/03/2015   Pneumococcal Polysaccharide-23 04/11/2013   Tdap 09/21/2006   Typhoid Inactivated 03/27/2000   Typhoid Live 10/19/2003  Zoster, Live 01/05/2010    Conditions to be addressed/monitored:  Hypertension, Hyperlipidemia, Diabetes, Atrial Fibrillation, Hypothyroidism, BPH and Glaucoma  Care Plan : [place]  Updates made by Charlton Haws, [place] since 08/19/2020 12:00 AM     Problem: Hypertension, Hyperlipidemia, Diabetes, Atrial Fibrillation, Hypothyroidism, BPH and Glaucoma   Priority: High     Long-Range Goal: Disease Management   Start Date: 03/02/2020  Expected End Date: 03/02/2021  This Visit's Progress: On track  Recent Progress: On track  Priority: High  Note:   Current Barriers:  Unable to independently monitor therapeutic efficacy Unable to maintain control of diabetes   Pharmacist Clinical Goal(s):  Patient will verbalize ability to afford treatment regimen achieve adherence to monitoring guidelines and medication adherence to achieve therapeutic efficacy maintain control of diabetes as evidenced by improved A1c and blood sugars  through collaboration with PharmD and provider.   Interventions: 1:1 collaboration with Binnie Rail, MD regarding development and update of comprehensive plan of care as evidenced by provider attestation and co-signature Inter-disciplinary care team collaboration (see longitudinal plan of care) Comprehensive medication review performed; medication list updated in electronic medical record  Hypertension (BP goal <130/80) -Controlled - BP has been at goal on current regimen -Current treatment: Amlodipine 10 mg  daily Metoprolol succinate 100 mg BID, extra 1/2 tab PRN -Denies hypotensive/hypertensive symptoms -Educated on BP goals and benefits of medications for prevention of heart attack, stroke and kidney damage; -Counseled to monitor BP at home as needed, document, and provide log at future appointments -Recommended to continue current medication  Diabetes (A1c goal <7%) -Not ideally controlled - A1c is above goal; pt just started taking higher dose of Rybelsus this week; Denies hypoglycemic symptoms -Current medications: Metformin ER 500 mg - 2 tab BID Glipizide 5 mg BID Jardiance 10 mg daily Rybelsus 14 mg daily -Medications previously tried: Farxiga, Tradjenta, Celesta Gentile -Current home glucose readings fasting glucose: 100 post prandial glucose: 200s -Educated on A1c and blood sugar goals; post-prandial blood sugars are likely driving J6B up since fasting BG largely controlled; Rybelsus targets post-prandial blood sugars  -Counseled that higher dose of Rybelsus in combination with glipizide may lead to some low sugars; advised to monitor closely and call endocrine if sugar is <70 - may need to reduce or d/c glipizide -Assessed finances - household income is too high for patient assistance -Recommend to continue current medication  Atrial Fibrillation (Goal: prevent stroke and major bleeding) -Controlled - pt endorses compliance and denies issues; he reports he is in the donut hole and Xarelto is expensive -CHADSVASC: 4 -Current treatment: Rate control: Metoprolol succinate 100 mg BID Anticoagulation: Xarelto 20 mg daily -Medications previously tried: Eliquis, warfarin -Home BP and HR readings: HR 60s  -Counseled on increased risk of stroke due to Afib and benefits of anticoagulation for stroke prevention; importance of adherence to anticoagulant exactly as prescribed; -Assessed patient finances - he can use Engineer, maintenance to get reduced cost Xarelto ($85/month) in coverage gap; however  this means Xarelto cost does not get factored in to drug costs for insurance and may delay reaching catastrophic coverage. Pt decided to call Medicare to see when he will enter catastrophic coverage -Recommended to continue current medication   Patient Goals/Self-Care Activities Patient will:  - take medications as prescribed -focus on medication adherence by pill box -check glucose daily, document, and provide at future appointments -Call Medicare about catastrophic coverage estimate        Medication Assistance:  High cost  medications: Xarelto, Jardiance, Januvia, Rybelsus, Vyzulta -unfortunately household income is too high to qualify for patient assistance -identified more cost-effective eye drops from patient's formulary and sent to patient to share with eye doctor -informed patient of Engineer, maintenance program for Xarelto cost savings during the coverage gap ("donut hole") -Rybelsus will replace Januvia in near future, and he may be able to come off Jardiance eventually, cutting down on overall number of Brand Name medications  Patient's preferred pharmacy is:  [place], [place] to [address] [place] [address] Phone: [phone] Fax: [phone]  [id number] [place] [place]: [phone]: [phone] [facility] [address]: [phone]: [phone] Uses pill box? Yes Pt endorses 100% compliance  We discussed: Current pharmacy is preferred with insurance plan and patient is satisfied with pharmacy services Patient decided to: Continue current medication management strategy  Care Plan and Follow Up Patient Decision:  Patient agrees to Care Plan and Follow-up.  Plan: Telephone follow up appointment  with care management team member scheduled for:  6 months  LCharlene Brooke PharmD, BBaylor Scott & White Hospital - TaylorClinical Pharmacist LRutherfordPrimary Care at [phone]

## 2020-08-19 ENCOUNTER — Encounter: Payer: Self-pay | Admitting: Internal Medicine

## 2020-08-19 MED ORDER — METFORMIN HCL ER 500 MG PO TB24: ORAL_TABLET | ORAL | 3 refills | Status: DC

## 2020-08-19 NOTE — Patient Instructions (Signed)
Visit Information  Phone number for Pharmacist: [phone]   Goals Addressed             This Visit's Progress    Manage My Medicine       Timeframe:  Long-Range Goal Priority:  High Start Date:     03/02/20                        Expected End Date:       03/02/21              Follow Up Date 09/15/20   - call for medicine refill 2 or 3 days before it runs out - call if I am sick and can't take my medicine - keep a list of all the medicines I take; vitamins and herbals too - use a pillbox to sort medicine  -Monitor for low sugars < 70. Contact prescriber if any low sugars. -Contact Medicare regarding donut hole/catastrophic coverage estimates   Why is this important?   These steps will help you keep on track with your medicines.         Care Plan : [place]  Updates made by Charlton Haws, RPH since 08/19/2020 12:00 AM     Problem: Hypertension, Hyperlipidemia, Diabetes, Atrial Fibrillation, Hypothyroidism, BPH and Glaucoma   Priority: High     Long-Range Goal: Disease Management   Start Date: 03/02/2020  Expected End Date: 03/02/2021  This Visit's Progress: On track  Recent Progress: On track  Priority: High  Note:   Current Barriers:  Unable to independently monitor therapeutic efficacy Unable to maintain control of diabetes   Pharmacist Clinical Goal(s):  Patient will verbalize ability to afford treatment regimen achieve adherence to monitoring guidelines and medication adherence to achieve therapeutic efficacy maintain control of diabetes as evidenced by improved A1c and blood sugars  through collaboration with PharmD and provider.   Interventions: 1:1 collaboration with Binnie Rail, MD regarding development and update of comprehensive plan of care as evidenced by provider attestation and co-signature Inter-disciplinary care team collaboration (see longitudinal plan of care) Comprehensive medication review performed; medication list  updated in electronic medical record  Hypertension (BP goal <130/80) -Controlled - BP has been at goal on current regimen -Current treatment: Amlodipine 10 mg daily Metoprolol succinate 100 mg BID, extra 1/2 tab PRN -Denies hypotensive/hypertensive symptoms -Educated on BP goals and benefits of medications for prevention of heart attack, stroke and kidney damage; -Counseled to monitor BP at home as needed, document, and provide log at future appointments -Recommended to continue current medication  Diabetes (A1c goal <7%) -Not ideally controlled - A1c is above goal; pt just started taking higher dose of Rybelsus this week; Denies hypoglycemic symptoms -Current medications: Metformin ER 500 mg - 2 tab BID Glipizide 5 mg BID Jardiance 10 mg daily Rybelsus 14 mg daily -Medications previously tried: Farxiga, Tradjenta, Januvia -Current home glucose readings fasting glucose: 100 post prandial glucose: 200s -Educated on A1c and blood sugar goals; post-prandial blood sugars are likely driving [id number] up since fasting BG largely controlled; Rybelsus targets post-prandial blood sugars  -Counseled that higher dose of Rybelsus in combination with glipizide may lead to some low sugars; advised to monitor closely and call endocrine if sugar is <70 - may need to reduce or d/c glipizide -Assessed finances - household income is too high for patient assistance -Recommend to continue current medication  Atrial Fibrillation (Goal: prevent stroke and major bleeding) -Controlled -  pt endorses compliance and denies issues; he reports he is in the donut hole and Xarelto is expensive -CHADSVASC: 4 -Current treatment: Rate control: Metoprolol succinate 100 mg BID Anticoagulation: Xarelto 20 mg daily -Medications previously tried: Eliquis, warfarin -Home BP and HR readings: HR 60s  -Counseled on increased risk of stroke due to Afib and benefits of anticoagulation for stroke prevention; importance of adherence  to anticoagulant exactly as prescribed; -Assessed patient finances - he can use Engineer, maintenance to get reduced cost Xarelto ($85/month) in coverage gap; however this means Xarelto cost does not get factored in to drug costs for insurance and may delay reaching catastrophic coverage. Pt decided to call Medicare to see when he will enter catastrophic coverage -Recommended to continue current medication   Patient Goals/Self-Care Activities Patient will:  - take medications as prescribed -focus on medication adherence by pill box -check glucose daily, document, and provide at future appointments -Call Medicare about catastrophic coverage estimate       Patient verbalizes understanding of instructions provided today and agrees to view in [place].  Telephone follow up appointment with pharmacy team member scheduled for: 6 months  Charlene Brooke, PharmD, [place], CPP Clinical Pharmacist [place] [facility] [phone]

## 2020-08-30 ENCOUNTER — Telehealth: Payer: Medicare Other

## 2020-09-15 ENCOUNTER — Encounter: Payer: Self-pay | Admitting: Internal Medicine

## 2020-09-15 ENCOUNTER — Other Ambulatory Visit: Payer: Self-pay

## 2020-09-15 DIAGNOSIS — D485 Neoplasm of uncertain behavior of skin: Secondary | ICD-10-CM | POA: Diagnosis not present

## 2020-09-15 DIAGNOSIS — Z20822 Contact with and (suspected) exposure to covid-19: Secondary | ICD-10-CM | POA: Diagnosis not present

## 2020-09-15 DIAGNOSIS — L821 Other seborrheic keratosis: Secondary | ICD-10-CM | POA: Diagnosis not present

## 2020-09-15 DIAGNOSIS — L905 Scar conditions and fibrosis of skin: Secondary | ICD-10-CM | POA: Diagnosis not present

## 2020-09-15 DIAGNOSIS — D225 Melanocytic nevi of trunk: Secondary | ICD-10-CM | POA: Diagnosis not present

## 2020-09-15 MED ORDER — RYBELSUS 14 MG PO TABS: 14.0000 mg | ORAL_TABLET | Freq: Every day | ORAL | 1 refills | Status: DC

## 2020-09-15 MED ORDER — AMOXICILLIN-POT CLAVULANATE 875-125 MG PO TABS: 1.0000 | ORAL_TABLET | Freq: Two times a day (BID) | ORAL | 0 refills | Status: DC

## 2020-09-16 ENCOUNTER — Encounter: Payer: Self-pay | Admitting: Internal Medicine

## 2020-09-16 NOTE — Progress Notes (Signed)
Subjective:    Patient ID: [name], male    DOB: [birth year], 75 y.o.   MRN: [id number]  HPI The patient is here for follow up of their chronic medical problems, including DM, htn.  He also has cold symptoms - runny nose, blocked ear, stuffy head, neg covid test   He is using flonase.  His ears are clogged, he has chills, nasal congestion and his head feels clogged.  He has had some dizziness.  His wife has similar symptoms and is currently taking an antibiotic.  COVID test was negative.  He is taking his other medications as prescribed and doing well with them.   Medications and allergies reviewed with patient and updated if appropriate.  Patient Active Problem List   Diagnosis Date Noted   Eczema 05/26/2020   Type 2 diabetes mellitus with microalbuminuria, without long-term current use of insulin ([place]) 03/30/2020   Bilateral inguinal hernia 03/25/2020   Aortic atherosclerosis ([place]) 10/30/2019   Nephrolithiasis 10/30/2019   Type 2 diabetes mellitus with hyperglycemia, without long-term current use of insulin ([place]) 10/28/2019   LLQ pain 09/26/2019   Eustachian tube dysfunction, right 06/03/2019   Diabetes mellitus with peripheral circulatory disorder, controlled ([place]) 11/09/2016   Lumbar radiculopathy 05/05/2015   Atypical atrial flutter ([place]) 09/12/2014   Hypothyroidism 12/30/2010   OSA (obstructive sleep apnea) 09/02/2010   Atrial fibrillation ([place]) 04/05/2009   ERECTILE DYSFUNCTION 05/01/2008   Hyperlipidemia 09/06/2007   Hypertension 09/06/2007   BPH with obstruction/lower urinary tract symptoms 04/25/2007    Current Outpatient Medications on File Prior to Visit  Medication Sig Dispense Refill   brimonidine (ALPHAGAN) 0.2 % ophthalmic solution 1 drop 2 (two) times daily.     dorzolamide (TRUSOPT) 2 % ophthalmic solution SMARTSIG:In Eye(s)     finasteride (PROPECIA) 1 MG tablet Take 0.25 mg by mouth daily.     glipiZIDE (GLUCOTROL) 5 MG tablet TAKE 1  TABLET (5 MG TOTAL) BY MOUTH 2 (TWO) TIMES DAILY BEFORE A MEAL. 180 tablet 1   JARDIANCE 25 MG TABS tablet TAKE 1 TABLET DAILY BEFORE BREAKFAST. 90 tablet 3   Latanoprostene Bunod (VYZULTA) 0.024 % SOLN Place 1 drop into both eyes daily.     levothyroxine (SYNTHROID) 88 MCG tablet Take 1 tablet (88 mcg total) by mouth daily. 90 tablet 3   lisinopril (ZESTRIL) 2.5 MG tablet TAKE 1 TABLET DAILY 90 tablet 1   Magnesium Oxide 500 MG CAPS Take 1 capsule by mouth 2 (two) times daily.     metFORMIN (GLUCOPHAGE-XR) 500 MG 24 hr tablet TAKE 2 TABLETS TWICE A DAY 360 tablet 3   metoprolol succinate (TOPROL-XL) 100 MG 24 hr tablet TAKE 1 TABLET TWICE A DAY AND AN EXTRA ONE-HALF (1/2) TABLET DAILY AS NEEDED FOR ATRIAL FIBRILLATION 225 tablet 1   rivaroxaban (XARELTO) 20 MG TABS tablet TAKE 1 TABLET DAILY WITH SUPPER 90 tablet 3   Semaglutide (RYBELSUS) 14 MG TABS Take 14 mg by mouth daily. 90 tablet 1   timolol (BETIMOL) 0.25 % ophthalmic solution 1-2 drops daily.     timolol (TIMOPTIC) 0.5 % ophthalmic solution 1 drop every morning.     No current facility-administered medications on file prior to visit.    Past Medical History:  Diagnosis Date   AF (atrial fibrillation) (HCC)    persistent afib, s/p afib ablation x 2 with subsequent convergent procedure at [facility]   Allergic rhinitis    seasonal   Atrial flutter (HCC)    CTS (carpal  tunnel syndrome)    PMH of   Diabetes mellitus    type 2   External hemorrhoids    Glaucoma    Hyperlipidemia    Hypertension    Hypothyroidism    Renal calculus 2005   [facility]    Past Surgical History:  Procedure Laterality Date   ABLATION  06/2013   atypical atrial flutter ablation by Dr Lehman Prom at [facility] (mitral annular flutter ablated)   afib 09/22/2008--11/03/2009     afib ablation x 2 by Dr Rayann Heman with subsequent convergent procedure at [facility]  2005   non obstructive CAD   CARDIOVERSION     CARDIOVERSION  12/09/2010    Procedure: CARDIOVERSION;  Surgeon: Loralie Champagne, MD;  Location: [facility];  Service: Cardiovascular;  Laterality: N/A;   CARDIOVERSION  09/15/2011   Procedure: CARDIOVERSION;  Surgeon: Josue Hector, MD;  Location[facility] ENDOSCOPY;  Service: Cardiovascular;  Laterality: N/A;  office drawing labs wed 8/28   CARDIOVERSION N/A 07/18/2012   Procedure: CARDIOVERSION;  Surgeon: Thayer Headings, MD;  Location[facility] ENDOSCOPY;  Service: Cardiovascular;  Laterality: N/A;   CARDIOVERSION  08/2014   [place]   COLONOSCOPY  2009   Dr Sharlett Iles   convergent procedure  2013   at [facility] Right 2008, 2012   LOOP RECORDER IMPLANT Left 12-2010   TONSILLECTOMY     TOTAL HIP ARTHROPLASTY  06/2010   Depuy prosthesis replacement, Dr Alvan Dame   TOTAL HIP ARTHROPLASTY Right 2008    Depuy;Dr Keith History   Socioeconomic History   Marital status: Married    Spouse name: Izora Gala   Number of children: 2   Years of education: Not on file   Highest education level: Not on file  Occupational History   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  Tobacco Use   Smoking status: Former    Packs/day: 0.90    Years: 8.00    Pack years: 7.20    Types: Cigarettes    Quit date: 01/16/1974    Years since quitting: 46.7   Smokeless tobacco: Never   Tobacco comments:    smoked  1966- 1976,up to 1 ppd  Vaping Use   Vaping Use: Never used  Substance and Sexual Activity   Alcohol use: Yes    Comment: socially   Drug use: No   Sexual activity: Not on file  Other Topics Concern   Not on file  Social History Narrative   Not on file   Social Determinants of Health   Financial Resource Strain: Low Risk    Difficulty of Paying Living Expenses: Not hard at all  Food Insecurity: No Food Insecurity   Worried About Charity fundraiser in the Last Year: Never true   [place] in the Last Year: Never true  Transportation Needs: No Transportation Needs   Lack of  Transportation (Medical): No   Lack of Transportation (Non-Medical): No  Physical Activity: Sufficiently Active   Days of Exercise per Week: 3 days   Minutes of Exercise per Session: 60 min  Stress: No Stress Concern Present   Feeling of Stress : Not at all  Social Connections: Moderately Isolated   Frequency of Communication with Friends and Family: More than three times a week   Frequency of Social Gatherings with Friends and Family: Never   Attends Religious Services: Never   Active Member  of Clubs or Organizations: No   Attends Archivist Meetings: Never   Marital Status: Married    Family History  Problem Relation Age of Onset   Diabetes Father    Stroke Father        mid 85s   Lung cancer Mother        non smoker   Stroke Mother 11   Testicular cancer Brother    Allergies Maternal Grandmother    Cancer Maternal Grandmother        unsure   Allergies Brother    Heart attack Neg Hx     Review of Systems  Constitutional:  Positive for chills. Negative for fever.  HENT:  Positive for congestion and ear pain. Negative for sinus pressure, sinus pain and sore throat.   Respiratory:  Positive for cough (mild). Negative for shortness of breath and wheezing.   Cardiovascular:  Negative for chest pain, palpitations and leg swelling.  Gastrointestinal:  Positive for nausea.  Musculoskeletal:  Negative for myalgias.  Neurological:  Positive for dizziness. Negative for headaches.      Objective:   Vitals:   09/17/20 1015  BP: 138/80  Pulse: 82  Temp: 97.9 F (36.6 C)  SpO2: 97%   BP Readings from Last 3 Encounters:  09/17/20 138/80  08/03/20 (!) 160/82  05/26/20 140/70   Wt Readings from Last 3 Encounters:  09/17/20 174 lb 6.4 oz (79.1 kg)  08/03/20 180 lb (81.6 kg)  05/26/20 183 lb 9.6 oz (83.3 kg)   Body mass index is 26.52 kg/m.   Physical Exam    GENERAL APPEARANCE: Appears stated age, well appearing, NAD EYES: conjunctiva clear, no  icterus HENT: bilateral tympanic membranes and ear canals normal, oropharynx with no erythema or exudates, trachea midline, no cervical or supraclavicular lymphadenopathy LUNGS: Unlabored breathing, good air entry bilaterally, clear to auscultation without wheeze or crackles CARDIOVASCULAR: Normal S1,S2 , no edema SKIN: Warm, dry      Assessment & Plan:    See Problem List for Assessment and Plan of chronic medical problems.    This visit occurred during the SARS-CoV-2 public health emergency.  Safety protocols were in place, including screening questions prior to the visit, additional usage of staff PPE, and extensive cleaning of exam room while observing appropriate contact time as indicated for disinfecting solutions.

## 2020-09-16 NOTE — Patient Instructions (Addendum)
  Blood work was ordered.     Medications changes include :   Augmentin for your infection.  Steroids.   Start mucinex   Your prescription(s) have been submitted to your pharmacy. Please take as directed and contact our office if you believe you are having problem(s) with the medication(s).    Please followup in 6 months

## 2020-09-17 ENCOUNTER — Other Ambulatory Visit: Payer: Self-pay

## 2020-09-17 ENCOUNTER — Ambulatory Visit (INDEPENDENT_AMBULATORY_CARE_PROVIDER_SITE_OTHER): Payer: Medicare Other | Admitting: Internal Medicine

## 2020-09-17 VITALS — BP 138/80 | HR 82 | Temp 97.9°F | Ht 68.0 in | Wt 174.4 lb

## 2020-09-17 DIAGNOSIS — E039 Hypothyroidism, unspecified: Secondary | ICD-10-CM

## 2020-09-17 DIAGNOSIS — I484 Atypical atrial flutter: Secondary | ICD-10-CM

## 2020-09-17 DIAGNOSIS — J0100 Acute maxillary sinusitis, unspecified: Secondary | ICD-10-CM | POA: Diagnosis not present

## 2020-09-17 DIAGNOSIS — I4891 Unspecified atrial fibrillation: Secondary | ICD-10-CM

## 2020-09-17 DIAGNOSIS — E7849 Other hyperlipidemia: Secondary | ICD-10-CM

## 2020-09-17 DIAGNOSIS — E1165 Type 2 diabetes mellitus with hyperglycemia: Secondary | ICD-10-CM | POA: Diagnosis not present

## 2020-09-17 DIAGNOSIS — I700 Atherosclerosis of aorta: Secondary | ICD-10-CM

## 2020-09-17 DIAGNOSIS — J019 Acute sinusitis, unspecified: Secondary | ICD-10-CM | POA: Insufficient documentation

## 2020-09-17 LAB — COMPREHENSIVE METABOLIC PANEL
ALT: 16 U/L (ref 0–53)
AST: 14 U/L (ref 0–37)
Albumin: 4.5 g/dL (ref 3.5–5.2)
Alkaline Phosphatase: 52 U/L (ref 39–117)
BUN: 15 mg/dL (ref 6–23)
CO2: 25 mEq/L (ref 19–32)
Calcium: 10.3 mg/dL (ref 8.4–10.5)
Chloride: 102 mEq/L (ref 96–112)
Creatinine, Ser: 0.87 mg/dL (ref 0.40–1.50)
GFR: 85.35 mL/min (ref >60.00)
Glucose, Bld: 194 mg/dL — ABNORMAL HIGH (ref 70–99)
Potassium: 4.9 mEq/L (ref 3.5–5.1)
Sodium: 139 mEq/L (ref 135–145)
Total Bilirubin: 0.5 mg/dL (ref 0.2–1.2)
Total Protein: 7.7 g/dL (ref 6.0–8.3)

## 2020-09-17 LAB — LIPID PANEL
Cholesterol: 103 mg/dL (ref 0–200)
HDL: 46.4 mg/dL (ref >39.00)
LDL Cholesterol: 32 mg/dL (ref 0–99)
NonHDL: 56.24
Total CHOL/HDL Ratio: 2
Triglycerides: 119 mg/dL (ref 0.0–149.0)
VLDL: 23.8 mg/dL (ref 0.0–40.0)

## 2020-09-17 LAB — HEMOGLOBIN A1C: Hgb A1c MFr Bld: 7.2 % — ABNORMAL HIGH (ref 4.6–6.5)

## 2020-09-17 LAB — TSH: TSH: 2.27 u[IU]/mL (ref 0.35–5.50)

## 2020-09-17 LAB — MICROALBUMIN / CREATININE URINE RATIO
Creatinine,U: 52.1 mg/dL
Microalb Creat Ratio: 25 mg/g (ref 0.0–30.0)
Microalb, Ur: 13 mg/dL — ABNORMAL HIGH (ref 0.0–1.9)

## 2020-09-17 MED ORDER — ATORVASTATIN CALCIUM 10 MG PO TABS: 10.0000 mg | ORAL_TABLET | Freq: Every day | ORAL | 2 refills | Status: DC

## 2020-09-17 MED ORDER — AMOXICILLIN-POT CLAVULANATE 875-125 MG PO TABS: 1.0000 | ORAL_TABLET | Freq: Two times a day (BID) | ORAL | 1 refills | Status: DC

## 2020-09-17 MED ORDER — AMLODIPINE BESYLATE 10 MG PO TABS: 10.0000 mg | ORAL_TABLET | Freq: Every day | ORAL | 3 refills | Status: DC

## 2020-09-17 MED ORDER — AMOXICILLIN-POT CLAVULANATE 875-125 MG PO TABS: 1.0000 | ORAL_TABLET | Freq: Two times a day (BID) | ORAL | 0 refills | Status: DC

## 2020-09-17 MED ORDER — METHYLPREDNISOLONE 4 MG PO TBPK: ORAL_TABLET | ORAL | 0 refills | Status: DC

## 2020-09-17 NOTE — Assessment & Plan Note (Signed)
Chronic  Clinically euthyroid Currently taking levothyroxine 88 mcg daily Check tsh  Titrate med dose if needed  

## 2020-09-17 NOTE — Assessment & Plan Note (Signed)
Chronic Continue atorvastatin 10 mg daily Check lipid panel

## 2020-09-17 NOTE — Assessment & Plan Note (Signed)
Acute Symptoms consistent with probable sinus infection His wife has similar symptoms and concern for bacterial cause Start Augmentin 875-125 mg twice daily x10 days Start Mucinex Continue Flonase Will prescribe Medrol Dosepak to see if that helps with some of his congestion and eustachian tube dysfunction Call if no improvement

## 2020-09-17 NOTE — Assessment & Plan Note (Signed)
Chronic Following with cardiology Continue Xarelto 20 mg daily, metoprolol XL 100 mg daily TSH CMP

## 2020-09-17 NOTE — Assessment & Plan Note (Signed)
Chronic Check lipid panel, CMP Continue atorvastatin 10 mg daily

## 2020-09-17 NOTE — Assessment & Plan Note (Signed)
Chronic Sugars from IPR well controlled at home Management per endocrine Will check A1c and urine microalbumin today

## 2020-09-23 ENCOUNTER — Ambulatory Visit: Payer: Medicare Other | Admitting: Internal Medicine

## 2020-09-24 DIAGNOSIS — D485 Neoplasm of uncertain behavior of skin: Secondary | ICD-10-CM | POA: Diagnosis not present

## 2020-09-24 DIAGNOSIS — L905 Scar conditions and fibrosis of skin: Secondary | ICD-10-CM | POA: Diagnosis not present

## 2020-09-24 DIAGNOSIS — D225 Melanocytic nevi of trunk: Secondary | ICD-10-CM | POA: Diagnosis not present

## 2020-10-11 ENCOUNTER — Encounter: Payer: Self-pay | Admitting: Internal Medicine

## 2020-10-11 MED ORDER — METOPROLOL SUCCINATE ER 100 MG PO TB24: ORAL_TABLET | ORAL | 1 refills | Status: DC

## 2020-10-27 DIAGNOSIS — Z23 Encounter for immunization: Secondary | ICD-10-CM | POA: Diagnosis not present

## 2020-11-02 ENCOUNTER — Ambulatory Visit: Payer: Medicare Other | Admitting: Internal Medicine

## 2020-11-04 ENCOUNTER — Telehealth: Payer: Self-pay

## 2020-11-04 NOTE — Progress Notes (Signed)
Chronic Care Management Pharmacy Assistant   Name: [name]  MRN: [id number] DOB: 1945/12/09   Reason for Encounter: Disease State   Conditions to be addressed/monitored: DMII   Recent office visits:  09/17/20 Binnie Rail, MD-PCP (Sinus Problem) Blood work was ordered.  Medications changes include :   Augmentin for your infection.  Steroids.   Start mucinex  Recent consult visits:  None ID  Hospital visits:  None in previous 6 months  Medications: Outpatient Encounter Medications as of 11/04/2020  Medication Sig   amLODipine (NORVASC) 10 MG tablet Take 1 tablet (10 mg total) by mouth daily. Keep scheduled appt in Sept must see provider for future refills   amoxicillin-clavulanate (AUGMENTIN) 875-125 MG tablet Take 1 tablet by mouth 2 (two) times daily.   atorvastatin (LIPITOR) 10 MG tablet Take 1 tablet (10 mg total) by mouth daily. Keep scheduled appt in Sept must see provider for future refills   brimonidine (ALPHAGAN) 0.2 % ophthalmic solution 1 drop 2 (two) times daily.   dorzolamide (TRUSOPT) 2 % ophthalmic solution SMARTSIG:In Eye(s)   finasteride (PROPECIA) 1 MG tablet Take 0.25 mg by mouth daily.   glipiZIDE (GLUCOTROL) 5 MG tablet TAKE 1 TABLET (5 MG TOTAL) BY MOUTH 2 (TWO) TIMES DAILY BEFORE A MEAL.   JARDIANCE 25 MG TABS tablet TAKE 1 TABLET DAILY BEFORE BREAKFAST.   Latanoprostene Bunod (VYZULTA) 0.024 % SOLN Place 1 drop into both eyes daily.   levothyroxine (SYNTHROID) 88 MCG tablet Take 1 tablet (88 mcg total) by mouth daily.   lisinopril (ZESTRIL) 2.5 MG tablet TAKE 1 TABLET DAILY   Magnesium Oxide 500 MG CAPS Take 1 capsule by mouth 2 (two) times daily.   metFORMIN (GLUCOPHAGE-XR) 500 MG 24 hr tablet TAKE 2 TABLETS TWICE A DAY   methylPREDNISolone (MEDROL DOSEPAK) 4 MG TBPK tablet 24 mg PO on day 1, then decr. by 4 mg/day x5 days   metoprolol succinate (TOPROL-XL) 100 MG 24 hr tablet TAKE 1 TABLET TWICE A DAY AND AN EXTRA ONE-HALF (1/2) TABLET  DAILY AS NEEDED FOR ATRIAL FIBRILLATION   rivaroxaban (XARELTO) 20 MG TABS tablet TAKE 1 TABLET DAILY WITH SUPPER   Semaglutide (RYBELSUS) 14 MG TABS Take 14 mg by mouth daily.   timolol (BETIMOL) 0.25 % ophthalmic solution 1-2 drops daily.   timolol (TIMOPTIC) 0.5 % ophthalmic solution 1 drop every morning.   No facility-administered encounter medications on file as of 11/04/2020.   Recent Relevant Labs: Lab Results  Component Value Date/Time   HGBA1C 7.2 (H) 09/17/2020 11:02 AM   HGBA1C 7.5 (A) 08/03/2020 09:53 AM   HGBA1C 7.2 (H) 03/25/2020 10:22 AM   MICROALBUR 13.0 (H) 09/17/2020 11:02 AM   MICROALBUR 28.6 (H) 03/25/2020 10:22 AM    Kidney Function Lab Results  Component Value Date/Time   CREATININE 0.87 09/17/2020 11:02 AM   CREATININE 0.93 05/19/2020 09:48 AM   CREATININE 1.10 09/26/2019 11:02 AM   CREATININE 1.14 07/28/2015 04:17 PM   GFR 85.35 09/17/2020 11:02 AM   GFRNONAA 67 09/26/2019 11:02 AM   GFRAA 77 09/26/2019 11:02 AM    Current antihyperglycemic regimen:  Rybelsus 14 mg  Metformin 500 mg  Glipizide 5 mg  Jardiance 25 mg  What recent interventions/DTPs have been made to improve glycemic control:  None Have there been any recent hospitalizations or ED visits since last visit with CPP? No Patient denies hypoglycemic symptoms, including None Patient denies hyperglycemic symptoms, including none How often are you checking your blood  sugar? in the morning before eating or drinking What are your blood sugars ranging? Patient state blood sugar ranges between 100-110 Fasting: 106 11/01/20 During the week, how often does your blood glucose drop below 70? Never Are you checking your feet daily/regularly? Patient states he has no issues with his feet  Adherence Review: Is the patient currently on a STATIN medication? Yes Is the patient currently on ACE/ARB medication? Yes Does the patient have >5 day gap between last estimated fill dates? No   Care  Gaps: Colonoscopy-09/03/17 Diabetic Foot Exam-09/04/18 Ophthalmology-01/07/20 Dexa Scan - NA Annual Well Visit - NA Micro albumin-09/17/20 Hemoglobin A1c- 09/17/20  Star Rating Drugs: Atorvastatin 10 mg last fill 10/24/20 90 ds Rybelsus 14 mg last fill 10/03/20 90 ds Metformin 500 mg last fill 10/26/20 90 ds Lisinopril 2.5 mg last fill 08/29/20 90 ds Glipizide 5 mg last fill 08/08/20 90 ds Jardiance 25 mg last fill 08/29/20 90 ds  Ethelene Hal Clinical Pharmacist Assistant [phone]

## 2020-11-09 ENCOUNTER — Ambulatory Visit: Payer: Medicare Other | Admitting: Internal Medicine

## 2020-12-02 DIAGNOSIS — H401111 Primary open-angle glaucoma, right eye, mild stage: Secondary | ICD-10-CM | POA: Diagnosis not present

## 2020-12-02 DIAGNOSIS — H4052X1 Glaucoma secondary to other eye disorders, left eye, mild stage: Secondary | ICD-10-CM | POA: Diagnosis not present

## 2020-12-06 ENCOUNTER — Encounter: Payer: Self-pay | Admitting: Internal Medicine

## 2020-12-07 ENCOUNTER — Ambulatory Visit (INDEPENDENT_AMBULATORY_CARE_PROVIDER_SITE_OTHER): Payer: Medicare Other | Admitting: Internal Medicine

## 2020-12-07 ENCOUNTER — Encounter: Payer: Self-pay | Admitting: Internal Medicine

## 2020-12-07 ENCOUNTER — Other Ambulatory Visit: Payer: Self-pay

## 2020-12-07 VITALS — BP 140/72 | HR 70 | Ht 68.0 in | Wt 172.0 lb

## 2020-12-07 DIAGNOSIS — M79651 Pain in right thigh: Secondary | ICD-10-CM | POA: Diagnosis not present

## 2020-12-07 DIAGNOSIS — E1165 Type 2 diabetes mellitus with hyperglycemia: Secondary | ICD-10-CM | POA: Diagnosis not present

## 2020-12-07 DIAGNOSIS — I10 Essential (primary) hypertension: Secondary | ICD-10-CM

## 2020-12-07 DIAGNOSIS — E039 Hypothyroidism, unspecified: Secondary | ICD-10-CM

## 2020-12-07 LAB — POCT GLYCOSYLATED HEMOGLOBIN (HGB A1C): Hemoglobin A1C: 6.6 % — AB (ref 4.0–5.6)

## 2020-12-07 MED ORDER — RYBELSUS 14 MG PO TABS: 14.0000 mg | ORAL_TABLET | Freq: Every day | ORAL | 1 refills | Status: DC

## 2020-12-07 MED ORDER — LISINOPRIL 2.5 MG PO TABS: 2.5000 mg | ORAL_TABLET | Freq: Every day | ORAL | 1 refills | Status: DC

## 2020-12-07 MED ORDER — LEVOTHYROXINE SODIUM 88 MCG PO TABS
88.0000 ug | ORAL_TABLET | Freq: Every day | ORAL | 3 refills | Status: DC
Start: 2020-12-07 — End: 2021-03-29

## 2020-12-07 NOTE — Progress Notes (Signed)
[place] Phone: [phone] Subjective:   [name], am serving as a scribe for Dr. Hulan Saas.  This visit occurred during the SARS-CoV-2 public health emergency.  Safety protocols were in place, including screening questions prior to the visit, additional usage of staff PPE, and extensive cleaning of exam room while observing appropriate contact time as indicated for disinfecting solutions.   I'm seeing this patient by the request  of:  Jeffrey Rail, MD  CC: Right thigh pain  [id number]  [name] is a 75 y.o. male coming in with complaint of R thigh pain. Intermittent pain for past 6 months. Peters history of knee pain. History of R side THR revision in 2012. Has had bone scan after revision. Patient states that his pain is at R quad tendon. Does have some lumbar spine pain.   Right hip replacement with normal bone scan 11/17 Replaced in 2012     Past Medical History:  Diagnosis Date   AF (atrial fibrillation) (HCC)    persistent afib, s/p afib ablation x 2 with subsequent convergent procedure at [facility]   Allergic rhinitis    seasonal   Atrial flutter ([place])    CTS (carpal tunnel syndrome)    PMH of   Diabetes mellitus    type 2   External hemorrhoids    Glaucoma    Hyperlipidemia    Hypertension    Hypothyroidism    Renal calculus 2005   [facility]   Past Surgical History:  Procedure Laterality Date   ABLATION  06/2013   atypical atrial flutter ablation by Dr Lehman Prom at [facility] (mitral annular flutter ablated)   afib 09/22/2008--11/03/2009     afib ablation x 2 by Dr Rayann Heman with subsequent convergent procedure at [place]  2005   non obstructive CAD   CARDIOVERSION     CARDIOVERSION  12/09/2010   Procedure: CARDIOVERSION;  Surgeon: Loralie Champagne, MD;  Location: [facility];  Service: Cardiovascular;  Laterality: N/A;   CARDIOVERSION  09/15/2011   Procedure:  CARDIOVERSION;  Surgeon: Josue Hector, MD;  Location: [facility] ENDOSCOPY;  Service: Cardiovascular;  Laterality: N/A;  office drawing labs wed 8/28   CARDIOVERSION N/A 07/18/2012   Procedure: CARDIOVERSION;  Surgeon: Thayer Headings, MD;  [facility] ENDOSCOPY;  Service: Cardiovascular;  Laterality: N/A;   CARDIOVERSION  08/2014   [place]   COLONOSCOPY  2009   Dr Sharlett Iles   convergent procedure  2013   at [facility] for a [place] Right 2008, 2012   LOOP RECORDER IMPLANT Left 12-2010   TONSILLECTOMY     TOTAL HIP ARTHROPLASTY  06/2010   Depuy prosthesis replacement, Dr Alvan Dame   TOTAL HIP ARTHROPLASTY Right 2008    Depuy;Dr Ritchey History   Socioeconomic History   Marital status: Married    Spouse name: Izora Gala   Number of children: 2   Years of education: Not on file   Highest education level: Not on file  Occupational History   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  Tobacco Use   Smoking status: Former    Packs/day: 0.90    Years: 8.00    Pack years: 7.20    Types: Cigarettes    Quit date: 01/16/1974    Years since quitting: 46.9   Smokeless tobacco: Never   Tobacco comments:    smoked  1966- 1976,up to 1 ppd  Vaping Use   Vaping Use: Never used  Substance and Sexual Activity   Alcohol use: Yes    Comment: socially   Drug use: Peters   Sexual activity: Not on file  Other Topics Concern   Not on file  Social History Narrative   Not on file   Social Determinants of Health   Financial Resource Strain: Not on file  Food Insecurity: Not on file  Transportation Needs: Not on file  Physical Activity: Not on file  Stress: Not on file  Social Connections: Not on file   Allergies  Allergen Reactions   Farxiga [Dapagliflozin] Other (See Comments)    Dry mouth, increased urination, increased appetite   Family History  Problem Relation Age of Onset   Diabetes Father    Stroke Father        mid 10s   Lung cancer Mother        non smoker    Stroke Mother 1   Testicular cancer Brother    Allergies Maternal Grandmother    Cancer Maternal Grandmother        unsure   Allergies Brother    Heart attack Neg Hx     Current Outpatient Medications (Endocrine & Metabolic):    glipiZIDE (GLUCOTROL) 5 MG tablet, TAKE 1 TABLET (5 MG TOTAL) BY MOUTH 2 (TWO) TIMES DAILY BEFORE A MEAL.   JARDIANCE 25 MG TABS tablet, TAKE 1 TABLET DAILY BEFORE BREAKFAST.   levothyroxine (SYNTHROID) 88 MCG tablet, Take 1 tablet (88 mcg total) by mouth daily.   metFORMIN (GLUCOPHAGE-XR) 500 MG 24 hr tablet, TAKE 2 TABLETS TWICE A DAY   Semaglutide (RYBELSUS) 14 MG TABS, Take 14 mg by mouth daily.  Current Outpatient Medications (Cardiovascular):    amLODipine (NORVASC) 10 MG tablet, Take 1 tablet (10 mg total) by mouth daily. Keep scheduled appt in Sept must see provider for future refills   atorvastatin (LIPITOR) 10 MG tablet, Take 1 tablet (10 mg total) by mouth daily. Keep scheduled appt in Sept must see provider for future refills   lisinopril (ZESTRIL) 2.5 MG tablet, Take 1 tablet (2.5 mg total) by mouth daily.   metoprolol succinate (TOPROL-XL) 100 MG 24 hr tablet, TAKE 1 TABLET TWICE A DAY AND AN EXTRA ONE-HALF (1/2) TABLET DAILY AS NEEDED FOR ATRIAL FIBRILLATION    Current Outpatient Medications (Hematological):    rivaroxaban (XARELTO) 20 MG TABS tablet, TAKE 1 TABLET DAILY WITH SUPPER  Current Outpatient Medications (Other):    brimonidine (ALPHAGAN) 0.2 % ophthalmic solution, 1 drop 2 (two) times daily.   dorzolamide (TRUSOPT) 2 % ophthalmic solution, SMARTSIG:In Eye(s)   finasteride (PROPECIA) 1 MG tablet, Take 0.25 mg by mouth daily.   Latanoprostene Bunod (VYZULTA) 0.024 % SOLN, Place 1 drop into both eyes daily.   Magnesium Oxide 500 MG CAPS, Take 1 capsule by mouth 2 (two) times daily.   timolol (TIMOPTIC) 0.5 % ophthalmic solution, 1 drop every morning.   Reviewed prior external information including notes and imaging from  primary  care provider As well as notes that were available from care everywhere and other healthcare systems.  Past medical history, social, surgical and family history all reviewed in electronic medical record.  Peters pertanent information unless stated regarding to the chief complaint.   Review of Systems:  Peters headache, visual changes, nausea, vomiting, diarrhea, constipation, dizziness, abdominal pain, skin rash, fevers, chills, night sweats, weight loss, swollen lymph nodes, body aches, joint swelling, chest pain,  shortness of breath, mood changes. POSITIVE muscle aches  Objective  Blood pressure 124/82, pulse (!) 58, height 5\' 8"  (1.727 m), weight 173 lb (78.5 kg), SpO2 99 %.   General: Peters apparent distress alert and oriented x3 mood and affect normal, dressed appropriately.  HEENT: Pupils equal, extraocular movements intact  Respiratory: Patient's speak in full sentences and does not appear short of breath  Cardiovascular: Peters lower extremity edema, non tender, Peters erythema  Gait normal with good balance and coordination.  MSK: Patient's right knee does have some mild lateral tracking of the patella noted with mild positive patellar grind test.  Trace effusion noted the patellofemoral joint.  Peters instability with valgus and varus force.  Limited muscular skeletal ultrasound was performed and interpreted by Hulan Saas, M  Limited musculoskeletal ultrasound was performed and interpreted by me showing the patient does have a trace effusion noted of the patellofemoral joint with narrowing noted.  Patient does have a medial meniscal tear but appears to be degenerative and chronic.  Peters abnormality noted of the popliteal area.    Impression and Recommendations:    The above documentation has been reviewed and is accurate and complete Lyndal Pulley, DO

## 2020-12-07 NOTE — Patient Instructions (Addendum)
-   Keep Up the Good Work ! - Glipizide 5 mg, 1 tablet before Breakfast and 1 tablet before  - Metformin 500 mg , 2 tablets twice a day  - Continue  Rybelsus to 14 mg daily  - Continue  Jardiance 25 mg, 1 tablet with Breakfast    HOW TO TREAT LOW BLOOD SUGARS (Blood sugar LESS THAN 70 MG/DL) Please follow the RULE OF 15 for the treatment of hypoglycemia treatment (when your (blood sugars are less than 70 mg/dL)   STEP 1: Take 15 grams of carbohydrates when your blood sugar is low, which includes:  3-4 GLUCOSE TABS  OR 3-4 OZ OF JUICE OR REGULAR SODA OR ONE TUBE OF GLUCOSE GEL    STEP 2: RECHECK blood sugar in 15 MINUTES STEP 3: If your blood sugar is still low at the 15 minute recheck --> then, go back to STEP 1 and treat AGAIN with another 15 grams of carbohydrates.

## 2020-12-07 NOTE — Progress Notes (Signed)
Name: [name]  Age/ Sex: 75 y.o., male   MRN/ DOB: [id number], 05-03-1945     PCP: Binnie Rail, MD   Reason for Endocrinology Evaluation: Type 2 Diabetes Mellitus  Initial Endocrine Consultative Visit: 10/28/2019    PATIENT IDENTIFIER: [name] is a 75 y.o. male with a past medical history of T2Dm and Dyslipidemia.  The patient has followed with Endocrinology clinic since 10/28/2019 for consultative assistance with management of his diabetes.  DIABETIC HISTORY:  Mr. Hayduk was diagnosed with DM many years ago.Farxiga- dry mouth and frequency. His hemoglobin A1c has ranged from 6.5% in 2015, peaking at 8.5% in 2018.    On his initial visit to our clinic his A1c was 7.6% He had concerns about the cost of Jardiance, [place].    He was on Glimepiride and Metformin and we switched Glimepiride to Glipizide   Rybelsus started 06/2020  SUBJECTIVE:   During the last visit (08/03/2020): A1c 7.5% Continue  Jardiance, switched Januvia to Rybelsus , continued Metformin and Glipizide       Today (12/07/2020): Mr. Gertsch is here for a follow up on diabetes management.  He checks his blood sugars 1 times daily.  The patient has not had hypoglycemic episodes since the last clinic visit.   He denies nausea , vomiting and diarrhea   He has noted right thigh pain, sharp, no exacerbating or relieving factors, does not have hip pain, intermittent over the past year   HOME DIABETES REGIMEN:  Glipizide 5 mg, 1 tablet BID Metformin 500 mg , 2 tablets twice a day  Jardiance 25 mg daily  Rybelsus 14 mg daily     Statin: Yes ACE-I/ARB: no    METER DOWNLOAD SUMMARY:  111- 347  DIABETIC COMPLICATIONS: Microvascular complications:   Denies: CKD, retinopathy, neuropathy Last Eye Exam: Completed 11/2020  Macrovascular complications:   Denies: CAD, CVA, PVD   HISTORY:  Past Medical History:  Past Medical History:  Diagnosis Date   AF  (atrial fibrillation) ([place])    persistent afib, s/p afib ablation x 2 with subsequent convergent procedure at [facility]   Allergic rhinitis    seasonal   Atrial flutter ([place])    CTS (carpal tunnel syndrome)    PMH of   Diabetes mellitus    type 2   External hemorrhoids    Glaucoma    Hyperlipidemia    Hypertension    Hypothyroidism    Renal calculus 2005   [facility]   Past Surgical History:  Past Surgical History:  Procedure Laterality Date   ABLATION  06/2013   atypical atrial flutter ablation by Dr Lehman Prom at [facility] (mitral annular flutter ablated)   afib 09/22/2008--11/03/2009     afib ablation x 2 by Dr Rayann Heman with subsequent convergent procedure at [place]  2005   non obstructive CAD   CARDIOVERSION     CARDIOVERSION  12/09/2010   Procedure: CARDIOVERSION;  Surgeon: Loralie Champagne, MD;  Location: [place];  Service: Cardiovascular;  Laterality: N/A;   CARDIOVERSION  09/15/2011   Procedure: CARDIOVERSION;  Surgeon: Josue Hector, MD;  Location: [facility] ENDOSCOPY;  Service: Cardiovascular;  Laterality: N/A;  office drawing labs wed 8/28   CARDIOVERSION N/A 07/18/2012   Procedure: CARDIOVERSION;  Surgeon: Thayer Headings, MD;  Location: [place];  Service: Cardiovascular;  Laterality: N/A;   CARDIOVERSION  08/2014   [place]   COLONOSCOPY  2009   Dr Sharlett Iles   convergent  procedure  2013   at [facility] for a [place] Right 2008, 2012   LOOP RECORDER IMPLANT Left 12-2010   TONSILLECTOMY     TOTAL HIP ARTHROPLASTY  06/2010   Depuy prosthesis replacement, Dr Alvan Dame   TOTAL HIP ARTHROPLASTY Right 2008    Depuy;Dr Alvan Dame   Social History:  reports that he quit smoking about 46 years ago. His smoking use included cigarettes. He has a 7.20 pack-year smoking history. He has never used smokeless tobacco. He reports current alcohol use. He reports that he does not use drugs. Family History:  Family History  Problem Relation Age of Onset   Diabetes Father     Stroke Father        mid 8s   Lung cancer Mother        non smoker   Stroke Mother 73   Testicular cancer Brother    Allergies Maternal Grandmother    Cancer Maternal Grandmother        unsure   Allergies Brother    Heart attack Neg Hx      HOME MEDICATIONS: Allergies as of 12/07/2020       Reactions   Farxiga [dapagliflozin] Other (See Comments)   Dry mouth, increased urination, increased appetite        Medication List        Accurate as of December 07, 2020  9:46 AM. If you have any questions, ask your nurse or doctor.          STOP taking these medications    amoxicillin-clavulanate 875-125 MG tablet Commonly known as: AUGMENTIN Stopped by: Dorita Sciara, MD   methylPREDNISolone 4 MG Tbpk tablet Commonly known as: MEDROL DOSEPAK Stopped by: Dorita Sciara, MD   timolol 0.25 % ophthalmic solution Commonly known as: BETIMOL Stopped by: Dorita Sciara, MD       TAKE these medications    amLODipine 10 MG tablet Commonly known as: NORVASC Take 1 tablet (10 mg total) by mouth daily. Keep scheduled appt in Sept must see provider for future refills   atorvastatin 10 MG tablet Commonly known as: LIPITOR Take 1 tablet (10 mg total) by mouth daily. Keep scheduled appt in Sept must see provider for future refills   brimonidine 0.2 % ophthalmic solution Commonly known as: ALPHAGAN 1 drop 2 (two) times daily.   dorzolamide 2 % ophthalmic solution Commonly known as: TRUSOPT SMARTSIG:In Eye(s)   finasteride 1 MG tablet Commonly known as: PROPECIA Take 0.25 mg by mouth daily.   glipiZIDE 5 MG tablet Commonly known as: GLUCOTROL TAKE 1 TABLET (5 MG TOTAL) BY MOUTH 2 (TWO) TIMES DAILY BEFORE A MEAL.   Jardiance 25 MG Tabs tablet Generic drug: empagliflozin TAKE 1 TABLET DAILY BEFORE BREAKFAST.   levothyroxine 88 MCG tablet Commonly known as: SYNTHROID Take 1 tablet (88 mcg total) by mouth daily.   lisinopril 2.5 MG  tablet Commonly known as: ZESTRIL TAKE 1 TABLET DAILY   Magnesium Oxide 500 MG Caps Take 1 capsule by mouth 2 (two) times daily.   metFORMIN 500 MG 24 hr tablet Commonly known as: GLUCOPHAGE-XR TAKE 2 TABLETS TWICE A DAY   metoprolol succinate 100 MG 24 hr tablet Commonly known as: TOPROL-XL TAKE 1 TABLET TWICE A DAY AND AN EXTRA ONE-HALF (1/2) TABLET DAILY AS NEEDED FOR ATRIAL FIBRILLATION   rivaroxaban 20 MG Tabs tablet Commonly known as: Xarelto TAKE 1 TABLET DAILY WITH SUPPER   Rybelsus 14 MG Tabs Generic drug: Semaglutide  Take 14 mg by mouth daily.   timolol 0.5 % ophthalmic solution Commonly known as: TIMOPTIC 1 drop every morning.   Vyzulta 0.024 % Soln Generic drug: Latanoprostene Bunod Place 1 drop into both eyes daily.         OBJECTIVE:   Vital Signs: BP 140/72 (BP Location: Left Arm, Patient Position: Sitting, Cuff Size: Small)   Pulse 70   Ht 5\' 8"  (1.727 m)   Wt 172 lb (78 kg)   SpO2 98%   BMI 26.15 kg/m   Wt Readings from Last 3 Encounters:  12/07/20 172 lb (78 kg)  09/17/20 174 lb 6.4 oz (79.1 kg)  08/03/20 180 lb (81.6 kg)     Exam: General: Pt appears well and is in NAD  Lungs: Clear with good BS bilat   Heart: RRR   Extremities: No pretibial edema.   Neuro: MS is good with appropriate affect, pt is alert and Ox3     DM Foot Exam 12/07/2020  The skin of the feet is intact without sores or ulcerations. The pedal pulses are 2+ on right and 2+ on left. The sensation is intact to a screening 5.07, 10 gram monofilament bilaterally     DATA REVIEWED:  Lab Results  Component Value Date   HGBA1C 6.6 (A) 12/07/2020   HGBA1C 7.2 (H) 09/17/2020   HGBA1C 7.5 (A) 08/03/2020    Latest Reference Range & Units 09/17/20 11:02  Sodium 135 - 145 mEq/L 139  Potassium 3.5 - 5.1 mEq/L 4.9  Chloride 96 - 112 mEq/L 102  CO2 19 - 32 mEq/L 25  Glucose 70 - 99 mg/dL 194 (H)  BUN 6 - 23 mg/dL 15  Creatinine 0.40 - 1.50 mg/dL 0.87  Calcium  8.4 - 10.5 mg/dL 10.3  Alkaline Phosphatase 39 - 117 U/L 52  Albumin 3.5 - 5.2 g/dL 4.5  AST 0 - 37 U/L 14  ALT 0 - 53 U/L 16  Total Protein 6.0 - 8.3 g/dL 7.7  Total Bilirubin 0.2 - 1.2 mg/dL 0.5  GFR >60.00 mL/min 85.35    Latest Reference Range & Units 09/17/20 11:02  Total CHOL/HDL Ratio  2  Cholesterol 0 - 200 mg/dL 103  HDL Cholesterol >39.00 mg/dL 46.40  LDL (calc) 0 - 99 mg/dL 32  MICROALB/CREAT RATIO 0.0 - 30.0 mg/g 25.0  NonHDL  56.24  Triglycerides 0.0 - 149.0 mg/dL 119.0  VLDL 0.0 - 40.0 mg/dL 23.8     ASSESSMENT / PLAN / RECOMMENDATIONS:   1) Type 2 Diabetes Mellitus,Optimally controlled, With Microalbuminuria - Most recent A1c of 6.6 %. Goal A1c < 7.0 %.    - Praised the pt on improved glycemic control  - Rybelsus is costly, he was provided with pt assistance papers in case needed  - No changes    MEDICATIONS: - Continue Glipizide 5 mg, 1 tablet before Breakfast and 1 tablet before  - Continue Metformin 500 mg , 2 tablets twice a day  - Continue   jardiance 25 mg, 1 tablet with Breakfast  - Continue Rybelsus 14 mg daily    EDUCATION / INSTRUCTIONS: BG monitoring instructions: Patient is instructed to check his blood sugars 1 times a day, fasting  Call [facility] if: BG persistently < 70  I reviewed the Rule of 15 for the treatment of hypoglycemia in detail with the patient. Literature supplied.  2) Diabetic complications:  Eye: Does not have known diabetic retinopathy.  Neuro/ Feet: Does not have known diabetic peripheral neuropathy .  Renal: Patient does not have known baseline CKD. He   is  on an ACEI/ARB at present.      F/U in 3 months    Signed electronically by: Mack Guise, MD  [facility]  [facility] [facility] [facility] [address] [place],  [address] Phone: [phone] FAX: [phone]   CC: Binnie Rail, MD [place] [address] Phone: [phone]   Fax: [phone]  Return to Endocrinology clinic as below: Future Appointments  Date Time Provider [place]  01/26/2021  8:45 AM Hayden Pedro, MD [facility] None  02/17/2021  1:45 PM [facility] CCM PHARMACIST [facility] None  03/17/2021 10:20 AM Burns, Claudina Lick, MD [facility] None

## 2020-12-07 NOTE — Telephone Encounter (Signed)
Script sent  

## 2020-12-13 ENCOUNTER — Ambulatory Visit: Payer: Self-pay

## 2020-12-13 ENCOUNTER — Other Ambulatory Visit: Payer: Self-pay

## 2020-12-13 ENCOUNTER — Ambulatory Visit (INDEPENDENT_AMBULATORY_CARE_PROVIDER_SITE_OTHER): Payer: Medicare Other

## 2020-12-13 ENCOUNTER — Encounter: Payer: Self-pay | Admitting: Family Medicine

## 2020-12-13 ENCOUNTER — Ambulatory Visit (INDEPENDENT_AMBULATORY_CARE_PROVIDER_SITE_OTHER): Payer: Medicare Other | Admitting: Family Medicine

## 2020-12-13 VITALS — BP 124/82 | HR 58 | Ht 68.0 in | Wt 173.0 lb

## 2020-12-13 DIAGNOSIS — M79604 Pain in right leg: Secondary | ICD-10-CM

## 2020-12-13 DIAGNOSIS — M1711 Unilateral primary osteoarthritis, right knee: Secondary | ICD-10-CM | POA: Diagnosis not present

## 2020-12-13 NOTE — Assessment & Plan Note (Signed)
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  I think the patient does have more effusions intermittently of the knee.  Discussed icing regimen and home exercises.  Discussed topical anti-inflammatories.  Patient work with Product/process development scientist to learn home exercises in greater amount.  Worsening pain will consider the possibility of injections.  X-rays are pending at this point can also consider formal physical therapy.

## 2020-12-13 NOTE — Patient Instructions (Addendum)
Voltaren gel Ice for 20 min, 2x a day Xray today See me in 6-8 weeks

## 2020-12-21 ENCOUNTER — Encounter: Payer: Self-pay | Admitting: Internal Medicine

## 2020-12-29 ENCOUNTER — Ambulatory Visit (INDEPENDENT_AMBULATORY_CARE_PROVIDER_SITE_OTHER): Payer: Medicare Other

## 2020-12-29 ENCOUNTER — Other Ambulatory Visit: Payer: Self-pay

## 2020-12-29 DIAGNOSIS — Z0000 Encounter for general adult medical examination without abnormal findings: Secondary | ICD-10-CM

## 2020-12-29 NOTE — Progress Notes (Signed)
I connected with Jeevan Kalla today by telephone and verified that I am speaking with the correct person using two identifiers. Location patient: home Location provider: work Persons participating in the virtual visit: patient, provider.   I discussed the limitations, risks, security and privacy concerns of performing an evaluation and management service by telephone and the availability of in person appointments. I also discussed with the patient that there may be a patient responsible charge related to this service. The patient expressed understanding and verbally consented to this telephonic visit.    Interactive audio and video telecommunications were attempted between this provider and patient, however failed, due to patient having technical difficulties OR patient did not have access to video capability.  We continued and completed visit with audio only.  Some vital signs may be absent or patient reported.   Time Spent with patient on telephone encounter: 40 minutes  Subjective:   [name] is a 75 y.o. male who presents for Medicare Annual/Subsequent preventive examination.  Review of Systems     Cardiac Risk Factors include: advanced age (>40men, >14 women);dyslipidemia;family history of premature cardiovascular disease;hypertension;male gender;diabetes mellitus     Objective:    There were no vitals filed for this visit. There is no height or weight on file to calculate BMI.  Advanced Directives 12/29/2020 11/03/2019 09/04/2018 06/26/2017 02/04/2016 07/18/2012 09/15/2011  Does Patient Have a Medical Advance Directive? Yes Yes Yes Yes No Patient does not have advance directive Patient does not have advance directive  Type of Advance Directive Living will;Healthcare Power of Attorney Living will;Healthcare Power of [place];Living will [place];Living will - - -  Does patient want to make changes to medical advance  directive? No - Patient declined No - Patient declined - - - - -  Copy of [place] in Chart? No - copy requested No - copy requested No - copy requested No - copy requested - - -  Pre-existing out of facility DNR order (yellow form or pink MOST form) - - - - - No No    Current Medications (verified) Outpatient Encounter Medications as of 12/29/2020  Medication Sig   amLODipine (NORVASC) 10 MG tablet Take 1 tablet (10 mg total) by mouth daily. Keep scheduled appt in Sept must see provider for future refills   atorvastatin (LIPITOR) 10 MG tablet Take 1 tablet (10 mg total) by mouth daily. Keep scheduled appt in Sept must see provider for future refills   brimonidine (ALPHAGAN) 0.2 % ophthalmic solution 1 drop 2 (two) times daily.   dorzolamide (TRUSOPT) 2 % ophthalmic solution SMARTSIG:In Eye(s)   finasteride (PROPECIA) 1 MG tablet Take 0.25 mg by mouth daily.   glipiZIDE (GLUCOTROL) 5 MG tablet TAKE 1 TABLET (5 MG TOTAL) BY MOUTH 2 (TWO) TIMES DAILY BEFORE A MEAL.   JARDIANCE 25 MG TABS tablet TAKE 1 TABLET DAILY BEFORE BREAKFAST.   Latanoprostene Bunod (VYZULTA) 0.024 % SOLN Place 1 drop into both eyes daily.   levothyroxine (SYNTHROID) 88 MCG tablet Take 1 tablet (88 mcg total) by mouth daily.   lisinopril (ZESTRIL) 2.5 MG tablet Take 1 tablet (2.5 mg total) by mouth daily.   Magnesium Oxide 500 MG CAPS Take 1 capsule by mouth 2 (two) times daily.   metFORMIN (GLUCOPHAGE-XR) 500 MG 24 hr tablet TAKE 2 TABLETS TWICE A DAY   metoprolol succinate (TOPROL-XL) 100 MG 24 hr tablet TAKE 1 TABLET TWICE A DAY AND AN EXTRA ONE-HALF (1/2) TABLET  DAILY AS NEEDED FOR ATRIAL FIBRILLATION   rivaroxaban (XARELTO) 20 MG TABS tablet TAKE 1 TABLET DAILY WITH SUPPER   Semaglutide (RYBELSUS) 14 MG TABS Take 14 mg by mouth daily.   timolol (TIMOPTIC) 0.5 % ophthalmic solution 1 drop every morning.   No facility-administered encounter medications on file as of 12/29/2020.    Allergies  (verified) Farxiga [dapagliflozin]   History: Past Medical History:  Diagnosis Date   AF (atrial fibrillation) (HCC)    persistent afib, s/p afib ablation x 2 with subsequent convergent procedure at [facility]   Allergic rhinitis    seasonal   Atrial flutter (HCC)    CTS (carpal tunnel syndrome)    PMH of   Diabetes mellitus    type 2   External hemorrhoids    Glaucoma    Hyperlipidemia    Hypertension    Hypothyroidism    Renal calculus 2005   [facility]   Past Surgical History:  Procedure Laterality Date   ABLATION  06/2013   atypical atrial flutter ablation by Dr Lehman Prom at [facility] (mitral annular flutter ablated)   afib 09/22/2008--11/03/2009     afib ablation x 2 by Dr Rayann Heman with subsequent convergent procedure at [place]  2005   non obstructive CAD   CARDIOVERSION     CARDIOVERSION  12/09/2010   Procedure: CARDIOVERSION;  Surgeon: Loralie Champagne, MD;  Location: [place];  Service: Cardiovascular;  Laterality: N/A;   CARDIOVERSION  09/15/2011   Procedure: CARDIOVERSION;  Surgeon: Josue Hector, MD;  Location: [facility] ENDOSCOPY;  Service: Cardiovascular;  Laterality: N/A;  office drawing labs wed 8/28   CARDIOVERSION N/A 07/18/2012   Procedure: CARDIOVERSION;  Surgeon: Thayer Headings, MD;  Location: [facility] ENDOSCOPY;  Service: Cardiovascular;  Laterality: N/A;   CARDIOVERSION  08/2014   [place]   COLONOSCOPY  2009   Dr Sharlett Iles   convergent procedure  2013   at [facility] for a fib   JOINT REPLACEMENT Right 2008, 2012   LOOP RECORDER IMPLANT Left 12-2010   TONSILLECTOMY     TOTAL HIP ARTHROPLASTY  06/2010   Depuy prosthesis replacement, Dr Alvan Dame   TOTAL HIP ARTHROPLASTY Right 2008    Depuy;Dr Alvan Dame   Family History  Problem Relation Age of Onset   Diabetes Father    Stroke Father        mid 14s   Lung cancer Mother        non smoker   Stroke Mother 38   Testicular cancer Brother    Allergies Maternal Grandmother    Cancer Maternal Grandmother         unsure   Allergies Brother    Heart attack Neg Hx    Social History   Socioeconomic History   Marital status: Married    Spouse name: Izora Gala   Number of children: 2   Years of education: Not on file   Highest education level: Not on file  Occupational History   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  Tobacco Use   Smoking status: Former    Packs/day: 0.90    Years: 8.00    Pack years: 7.20    Types: Cigarettes    Quit date: 01/16/1974    Years since quitting: 46.9   Smokeless tobacco: Never   Tobacco comments:    smoked  1966- 1976,up to 1 ppd  Vaping Use   Vaping Use: Never used  Substance and Sexual Activity  Alcohol use: Yes    Comment: socially   Drug use: No   Sexual activity: Not on file  Other Topics Concern   Not on file  Social History Narrative   Not on file   Social Determinants of Health   Financial Resource Strain: Low Risk    Difficulty of Paying Living Expenses: Not hard at all  Food Insecurity: No Food Insecurity   Worried About Running Out of Food in the Last Year: Never true   [place] in the Last Year: Never true  Transportation Needs: No Transportation Needs   Lack of Transportation (Medical): No   Lack of Transportation (Non-Medical): No  Physical Activity: Sufficiently Active   Days of Exercise per Week: 7 days   Minutes of Exercise per Session: 30 min  Stress: No Stress Concern Present   Feeling of Stress : Not at all  Social Connections: Moderately Isolated   Frequency of Communication with Friends and Family: More than three times a week   Frequency of Social Gatherings with Friends and Family: Never   Attends Religious Services: Never   Marine scientist or Organizations: No   Attends Music therapist: Never   Marital Status: Married    Tobacco Counseling Counseling given: Not Answered Tobacco comments: smoked  1966- 1976,up to 1 ppd   Clinical Intake:  Pre-visit preparation  completed: Yes  Pain : No/denies pain     Nutritional Risks: None Diabetes: Yes  How often do you need to have someone help you when you read instructions, pamphlets, or other written materials from your doctor or pharmacy?: 1 - Never What is the last grade level you completed in school?: Associates Degree  Diabetic? yes  Interpreter Needed?: No  Information entered by :: Lisette Abu, LPN   Activities of Daily Living In your present state of health, do you have any difficulty performing the following activities: 12/29/2020  Hearing? N  Vision? N  Difficulty concentrating or making decisions? N  Walking or climbing stairs? N  Dressing or bathing? N  Doing errands, shopping? N  Preparing Food and eating ? N  Using the Toilet? N  In the past six months, have you accidently leaked urine? N  Do you have problems with loss of bowel control? N  Managing your Medications? N  Managing your Finances? N  Housekeeping or managing your Housekeeping? N  Some recent data might be hidden    Patient Care Team: Binnie Rail, MD as PCP - General (Internal Medicine) Thompson Grayer, MD as PCP - Cardiology (Cardiology) Juluis Rainier as Consulting Physician (Optometry) [place], Cleaster Corin, [facility] as Pharmacist (Pharmacist)  Indicate any recent Medical Services you may have received from other than [facility] providers in the past year (date may be approximate).     Assessment:   This is a routine wellness examination for [place].  Hearing/Vision screen Hearing Screening - Comments:: Patient denied any hearing difficulty.   No hearing aids.  Vision Screening - Comments:: Patient wears corrective glasses/contacts.  Eye exam done annually by: Alois Cliche, OD.  Dietary issues and exercise activities discussed: Current Exercise Habits: Home exercise routine, Type of exercise: walking, Time (Minutes): 30, Frequency (Times/Week): 7, Weekly Exercise (Minutes/Week): 210, Intensity: Moderate,  Exercise limited by: None identified   Goals Addressed               This Visit's Progress     Diabetes Patient stated goal (pt-stated)  My goal is to monitor HgA1C by watching my sugar intake, staying hydrated and being physically active.  I recently started Rybelsus and it has decreased my appetite and HgA1C numbers.      Depression Screen PHQ 2/9 Scores 12/29/2020 11/03/2019 09/04/2018 09/04/2018 06/26/2017 11/09/2016 09/28/2015  PHQ - 2 Score 0 0 1 0 0 0 0  PHQ- 9 Score - - - - 1 - -    Fall Risk Fall Risk  12/29/2020 11/03/2019 04/03/2019 09/04/2018 06/26/2017  Falls in the past year? 0 0 0 0 No  Comment - - - - -  Number falls in past yr: 0 0 - 0 -  Injury with Fall? 0 0 0 0 -  Risk for fall due to : No Fall Risks No Fall Risks - - -  Follow up Falls prevention discussed Falls evaluation completed - - -    FALL RISK PREVENTION PERTAINING TO THE HOME:  Any stairs in or around the home? Yes  If so, are there any without handrails? No  Home free of loose throw rugs in walkways, pet beds, electrical cords, etc? Yes  Adequate lighting in your home to reduce risk of falls? Yes   ASSISTIVE DEVICES UTILIZED TO PREVENT FALLS:  Life alert? No  Use of a cane, walker or w/c? No  Grab bars in the bathroom? No  Shower chair or bench in shower? No  Elevated toilet seat or a handicapped toilet? No   TIMED UP AND GO:  Was the test performed? No .  Length of time to ambulate 10 feet: n/a sec.   Gait steady and fast without use of assistive device (per patient)  Cognitive Function: Normal cognitive status assessed by direct observation by this Nurse Health Advisor. No abnormalities found.          Immunizations Immunization History  Administered Date(s) Administered   Fluad Quad(high Dose 65+) 09/02/2018   Hepatitis A 03/27/2000, 10/16/2000   Influenza Whole 10/17/2011   Influenza, High Dose Seasonal PF 09/28/2015, 10/13/2016, 09/28/2017   Influenza,inj,Quad PF,6+  Mos 10/16/2012, 09/29/2014, 10/13/2016   Influenza-Unspecified 10/30/2013, 09/02/2018, 09/25/2019   PFIZER(Purple Top)SARS-COV-2 Vaccination 02/10/2019, 03/03/2019   Pneumococcal Conjugate-13 02/03/2015   Pneumococcal Polysaccharide-23 04/11/2013   Tdap 09/21/2006   Typhoid Inactivated 03/27/2000   Typhoid Live 10/19/2003   Zoster, Live 01/05/2010    TDAP status: Due, Education has been provided regarding the importance of this vaccine. Advised may receive this vaccine at local pharmacy or Health Dept. Aware to provide a copy of the vaccination record if obtained from local pharmacy or Health Dept. Verbalized acceptance and understanding.  Flu Vaccine status: Up to date  Pneumococcal vaccine status: Up to date  Covid-19 vaccine status: Completed vaccines  Qualifies for Shingles Vaccine? Yes   Zostavax completed Yes   Shingrix Completed?: No.    Education has been provided regarding the importance of this vaccine. Patient has been advised to call insurance company to determine out of pocket expense if they have not yet received this vaccine. Advised may also receive vaccine at local pharmacy or Health Dept. Verbalized acceptance and understanding.  Screening Tests Health Maintenance  Topic Date Due   TETANUS/TDAP  09/20/2016   Zoster Vaccines- Shingrix (1 of 2) 04/14/2021 (Originally 10/16/1996)   INFLUENZA VACCINE  04/15/2021 (Originally 08/16/2020)   OPHTHALMOLOGY EXAM  01/06/2021   HEMOGLOBIN A1C  06/06/2021   FOOT EXAM  12/07/2021   COLONOSCOPY (Pts 45-72yrs Insurance coverage will need to be confirmed)  09/04/2022  Pneumonia Vaccine 31+ Years old  Completed   Hepatitis C Screening  Completed   HPV VACCINES  Aged Out   COVID-19 Vaccine  Discontinued    Health Maintenance  Health Maintenance Due  Topic Date Due   TETANUS/TDAP  09/20/2016    Colorectal cancer screening: Type of screening: Colonoscopy. Completed 09/03/2017. Repeat every 5 years  Lung Cancer Screening:  (Low Dose CT Chest recommended if Age 37-80 years, 30 pack-year currently smoking OR have quit w/in 15years.) does not qualify.   Lung Cancer Screening Referral: no  Additional Screening:  Hepatitis C Screening: does qualify; Completed yes  Vision Screening: Recommended annual ophthalmology exams for early detection of glaucoma and other disorders of the eye. Is the patient up to date with their annual eye exam?  Yes  Who is the provider or what is the name of the office in which the patient attends annual eye exams? Alois Cliche, OD. If pt is not established with a provider, would they like to be referred to a provider to establish care? No .   Dental Screening: Recommended annual dental exams for proper oral hygiene  Community Resource Referral / Chronic Care Management: CRR required this visit?  No   CCM required this visit?  No      Plan:     I have personally reviewed and noted the following in the patients chart:   Medical and social history Use of alcohol, tobacco or illicit drugs  Current medications and supplements including opioid prescriptions. Patient is not currently taking opioid prescriptions. Functional ability and status Nutritional status Physical activity Advanced directives List of other physicians Hospitalizations, surgeries, and ER visits in previous 12 months Vitals Screenings to include cognitive, depression, and falls Referrals and appointments  In addition, I have reviewed and discussed with patient certain preventive protocols, quality metrics, and best practice recommendations. A written personalized care plan for preventive services as well as general preventive health recommendations were provided to patient.     Sheral Flow, LPN   [id number]/1443   Nurse Notes:  Patient is cogitatively intact. There were no vitals filed for this visit. There is no height or weight on file to calculate BMI. Hearing Screening - Comments:: Patient denied  any hearing difficulty.   No hearing aids.  Vision Screening - Comments:: Patient wears corrective glasses/contacts.  Eye exam done annually by: Alois Cliche, OD.

## 2021-01-26 ENCOUNTER — Other Ambulatory Visit: Payer: Self-pay

## 2021-01-26 ENCOUNTER — Encounter (INDEPENDENT_AMBULATORY_CARE_PROVIDER_SITE_OTHER): Payer: Medicare Other | Admitting: Ophthalmology

## 2021-01-26 DIAGNOSIS — E113293 Type 2 diabetes mellitus with mild nonproliferative diabetic retinopathy without macular edema, bilateral: Secondary | ICD-10-CM | POA: Diagnosis not present

## 2021-01-26 DIAGNOSIS — I10 Essential (primary) hypertension: Secondary | ICD-10-CM | POA: Diagnosis not present

## 2021-01-26 DIAGNOSIS — H43813 Vitreous degeneration, bilateral: Secondary | ICD-10-CM

## 2021-01-26 DIAGNOSIS — H35033 Hypertensive retinopathy, bilateral: Secondary | ICD-10-CM

## 2021-01-31 NOTE — Progress Notes (Signed)
[facility] [place] Phone: [phone] Subjective:   [name], am serving as a scribe for Dr. Hulan Saas. This visit occurred during the SARS-CoV-2 public health emergency.  Safety protocols were in place, including screening questions prior to the visit, additional usage of staff PPE, and extensive cleaning of exam room while observing appropriate contact time as indicated for disinfecting solutions.  I'm seeing this patient by the request  of:  Binnie Rail, MD  CC: right knee pain   [id number]  12/13/2020  believe the patient's thigh pain seems to be more secondary to the patellofemoral arthritis.  I think the patient does have more effusions intermittently of the knee.  Discussed icing regimen and home exercises.  Discussed topical anti-inflammatories.  Patient work with Product/process development scientist to learn home exercises in greater amount.  Worsening pain will consider the possibility of injections.  X-rays are pending at this point can also consider formal physical therapy.  Update 02/01/2021 [name] is a 76 y.o. male coming in with complaint of R knee pain. Patient states that he has been doing well since last visit.  Patient states 60 to 70% better.  Has not been though very active.  Patient is wanting to start back to the gym and is wondering what is going to happen when he does get active again       Past Medical History:  Diagnosis Date   AF (atrial fibrillation) ([place])    persistent afib, s/p afib ablation x 2 with subsequent convergent procedure at [facility]   Allergic rhinitis    seasonal   Atrial flutter ([place])    CTS (carpal tunnel syndrome)    PMH of   Diabetes mellitus    type 2   External hemorrhoids    Glaucoma    Hyperlipidemia    Hypertension    Hypothyroidism    Renal calculus 2005   [facility]   Past Surgical History:  Procedure Laterality Date   ABLATION  06/2013   atypical  atrial flutter ablation by Dr Lehman Prom at [facility] (mitral annular flutter ablated)   afib 09/22/2008--11/03/2009     afib ablation x 2 by Dr Rayann Heman with subsequent convergent procedure at [place]  2005   non obstructive CAD   CARDIOVERSION     CARDIOVERSION  12/09/2010   Procedure: CARDIOVERSION;  Surgeon: Loralie Champagne, MD;  Location: [place];  Service: Cardiovascular;  Laterality: N/A;   CARDIOVERSION  09/15/2011   Procedure: CARDIOVERSION;  Surgeon: Josue Hector, MD;  Location: [facility] ENDOSCOPY;  Service: Cardiovascular;  Laterality: N/A;  office drawing labs wed 8/28   CARDIOVERSION N/A 07/18/2012   Procedure: CARDIOVERSION;  Surgeon: Thayer Headings, MD;  Location: [facility] ENDOSCOPY;  Service: Cardiovascular;  Laterality: N/A;   CARDIOVERSION  08/2014   [place]   COLONOSCOPY  2009   Dr Sharlett Iles   convergent procedure  2013   at [facility] for a [place] Right 2008, 2012   LOOP RECORDER IMPLANT Left 12-2010   TONSILLECTOMY     TOTAL HIP ARTHROPLASTY  06/2010   Depuy prosthesis replacement, Dr Alvan Dame   TOTAL HIP ARTHROPLASTY Right 2008    Depuy;Dr [place] History   Socioeconomic History   Marital status: Married    Spouse name: Izora Gala   Number of children: 2   Years of education: Not on file   Highest education level: Not  on file  Occupational History   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  Tobacco Use   Smoking status: Former    Packs/day: 0.90    Years: 8.00    Pack years: 7.20    Types: Cigarettes    Quit date: 01/16/1974    Years since quitting: 47.0   Smokeless tobacco: Never   Tobacco comments:    smoked  1966- 1976,up to 1 ppd  Vaping Use   Vaping Use: Never used  Substance and Sexual Activity   Alcohol use: Yes    Comment: socially   Drug use: Peters   Sexual activity: Not on file  Other Topics Concern   Not on file  Social History Narrative   Not on file   Social Determinants of Health   Financial Resource  Strain: Low Risk    Difficulty of Paying Living Expenses: Not hard at all  Food Insecurity: Peters Food Insecurity   Worried About Charity fundraiser in the Last Year: Never true   [place] in the Last Year: Never true  Transportation Needs: Peters Transportation Needs   Lack of Transportation (Medical): Peters   Lack of Transportation (Non-Medical): Peters  Physical Activity: Sufficiently Active   Days of Exercise per Week: 7 days   Minutes of Exercise per Session: 30 min  Stress: Peters Stress Concern Present   Feeling of Stress : Not at all  Social Connections: Moderately Isolated   Frequency of Communication with Friends and Family: More than three times a week   Frequency of Social Gatherings with Friends and Family: Never   Attends Religious Services: Never   Marine scientist or Organizations: Peters   Attends Music therapist: Never   Marital Status: Married   Allergies  Allergen Reactions   Farxiga [Dapagliflozin] Other (See Comments)    Dry mouth, increased urination, increased appetite   Family History  Problem Relation Age of Onset   Diabetes Father    Stroke Father        mid 10s   Lung cancer Mother        non smoker   Stroke Mother 1   Testicular cancer Brother    Allergies Maternal Grandmother    Cancer Maternal Grandmother        unsure   Allergies Brother    Heart attack Neg Hx     Current Outpatient Medications (Endocrine & Metabolic):    glipiZIDE (GLUCOTROL) 5 MG tablet, TAKE 1 TABLET (5 MG TOTAL) BY MOUTH 2 (TWO) TIMES DAILY BEFORE A MEAL.   JARDIANCE 25 MG TABS tablet, TAKE 1 TABLET DAILY BEFORE BREAKFAST.   levothyroxine (SYNTHROID) 88 MCG tablet, Take 1 tablet (88 mcg total) by mouth daily.   metFORMIN (GLUCOPHAGE-XR) 500 MG 24 hr tablet, TAKE 2 TABLETS TWICE A DAY   Semaglutide (RYBELSUS) 14 MG TABS, Take 14 mg by mouth daily.  Current Outpatient Medications (Cardiovascular):    amLODipine (NORVASC) 10 MG tablet, Take 1 tablet (10 mg  total) by mouth daily. Keep scheduled appt in Sept must see provider for future refills   atorvastatin (LIPITOR) 10 MG tablet, Take 1 tablet (10 mg total) by mouth daily. Keep scheduled appt in Sept must see provider for future refills   lisinopril (ZESTRIL) 2.5 MG tablet, Take 1 tablet (2.5 mg total) by mouth daily.   metoprolol succinate (TOPROL-XL) 100 MG 24 hr tablet, TAKE 1 TABLET TWICE A DAY AND AN EXTRA ONE-HALF (  1/2) TABLET DAILY AS NEEDED FOR ATRIAL FIBRILLATION    Current Outpatient Medications (Hematological):    rivaroxaban (XARELTO) 20 MG TABS tablet, TAKE 1 TABLET DAILY WITH SUPPER  Current Outpatient Medications (Other):    brimonidine (ALPHAGAN) 0.2 % ophthalmic solution, 1 drop 2 (two) times daily.   dorzolamide (TRUSOPT) 2 % ophthalmic solution, SMARTSIG:In Eye(s)   finasteride (PROPECIA) 1 MG tablet, Take 0.25 mg by mouth daily.   Latanoprostene Bunod (VYZULTA) 0.024 % SOLN, Place 1 drop into both eyes daily.   Magnesium Oxide 500 MG CAPS, Take 1 capsule by mouth 2 (two) times daily.   timolol (TIMOPTIC) 0.5 % ophthalmic solution, 1 drop every morning.   Reviewed prior external information including notes and imaging from  primary care provider As well as notes that were available from care everywhere and other healthcare systems.  Past medical history, social, surgical and family history all reviewed in electronic medical record.  Peters pertanent information unless stated regarding to the chief complaint.   Review of Systems:  Peters headache, visual changes, nausea, vomiting, diarrhea, constipation, dizziness, abdominal pain, skin rash, fevers, chills, night sweats, weight loss, swollen lymph nodes, body aches, joint swelling, chest pain, shortness of breath, mood changes. POSITIVE muscle aches  Objective  Blood pressure 130/70, pulse 85, height 5\' 8"  (1.727 m), weight 174 lb (78.9 kg), SpO2 99 %.   General: Peters apparent distress alert and oriented x3 mood and affect  normal, dressed appropriately.  HEENT: Pupils equal, extraocular movements intact  Respiratory: Patient's speak in full sentences and does not appear short of breath  Cardiovascular: Peters lower extremity edema, non tender, Peters erythema  Gait normal with good balance and coordination.  MSK: Right knee exam still has some mild atrophy of the musculature around it.  Patient now has significant decrease in the effusion from previous exam.  Patient does have crepitus noted with some lateral tracking of the patella.  Tender to palpation laterally over the medial joint line    Impression and Recommendations:     The above documentation has been reviewed and is accurate and complete Lyndal Pulley, DO

## 2021-02-01 ENCOUNTER — Ambulatory Visit (INDEPENDENT_AMBULATORY_CARE_PROVIDER_SITE_OTHER): Payer: Medicare Other | Admitting: Family Medicine

## 2021-02-01 ENCOUNTER — Other Ambulatory Visit: Payer: Self-pay

## 2021-02-01 ENCOUNTER — Encounter: Payer: Self-pay | Admitting: Family Medicine

## 2021-02-01 DIAGNOSIS — M1711 Unilateral primary osteoarthritis, right knee: Secondary | ICD-10-CM

## 2021-02-01 NOTE — Patient Instructions (Addendum)
Knee looking good Start with machine weights with trainer Ice after activity Dr. Zadie Rhine for TMJ See me again in 2 months

## 2021-02-01 NOTE — Assessment & Plan Note (Signed)
Only known arthritic changes but responding well to conservative therapy.  Patient encouraged to try to start increasing activity and see how it responds.  Worsening pain I do think the patient could be a possibility of a candidate for different injections.  He may need even bracing in the long run.  Follow-up with me again in 4 to 6 weeks

## 2021-02-02 ENCOUNTER — Encounter: Payer: Self-pay | Admitting: Internal Medicine

## 2021-02-02 ENCOUNTER — Ambulatory Visit (INDEPENDENT_AMBULATORY_CARE_PROVIDER_SITE_OTHER): Payer: Medicare Other | Admitting: Internal Medicine

## 2021-02-02 DIAGNOSIS — H9202 Otalgia, left ear: Secondary | ICD-10-CM | POA: Diagnosis not present

## 2021-02-02 MED ORDER — AZITHROMYCIN 250 MG PO TABS: ORAL_TABLET | ORAL | 0 refills | Status: DC

## 2021-02-02 NOTE — Patient Instructions (Signed)
Afrin nasal spray as needed Tylenol Ibuprofen 400 mg once w/food for neuralgia pain

## 2021-02-02 NOTE — Progress Notes (Signed)
Subjective:  Patient ID: [name], male    DOB: 07/15/1945  Age: 76 y.o. MRN: [id number]  CC: Office Visit (Left ear pain since 1/16)   HPI [name] presents for L earache x 2 d Going on a cruise   Outpatient Medications Prior to Visit  Medication Sig Dispense Refill   amLODipine (NORVASC) 10 MG tablet Take 1 tablet (10 mg total) by mouth daily. Keep scheduled appt in Sept must see provider for future refills 90 tablet 3   atorvastatin (LIPITOR) 10 MG tablet Take 1 tablet (10 mg total) by mouth daily. Keep scheduled appt in Sept must see provider for future refills 90 tablet 2   brimonidine (ALPHAGAN) 0.2 % ophthalmic solution 1 drop 2 (two) times daily.     dorzolamide (TRUSOPT) 2 % ophthalmic solution SMARTSIG:In Eye(s)     finasteride (PROPECIA) 1 MG tablet Take 0.25 mg by mouth daily.     glipiZIDE (GLUCOTROL) 5 MG tablet TAKE 1 TABLET (5 MG TOTAL) BY MOUTH 2 (TWO) TIMES DAILY BEFORE A MEAL. 180 tablet 1   JARDIANCE 25 MG TABS tablet TAKE 1 TABLET DAILY BEFORE BREAKFAST. 90 tablet 3   Latanoprostene Bunod (VYZULTA) 0.024 % SOLN Place 1 drop into both eyes daily.     levothyroxine (SYNTHROID) 88 MCG tablet Take 1 tablet (88 mcg total) by mouth daily. 90 tablet 3   lisinopril (ZESTRIL) 2.5 MG tablet Take 1 tablet (2.5 mg total) by mouth daily. 90 tablet 1   Magnesium Oxide 500 MG CAPS Take 1 capsule by mouth 2 (two) times daily.     metFORMIN (GLUCOPHAGE-XR) 500 MG 24 hr tablet TAKE 2 TABLETS TWICE A DAY 360 tablet 3   metoprolol succinate (TOPROL-XL) 100 MG 24 hr tablet TAKE 1 TABLET TWICE A DAY AND AN EXTRA ONE-HALF (1/2) TABLET DAILY AS NEEDED FOR ATRIAL FIBRILLATION 225 tablet 1   rivaroxaban (XARELTO) 20 MG TABS tablet TAKE 1 TABLET DAILY WITH SUPPER 90 tablet 3   Semaglutide (RYBELSUS) 14 MG TABS Take 14 mg by mouth daily. 90 tablet 1   timolol (TIMOPTIC) 0.5 % ophthalmic solution 1 drop every morning.     No facility-administered medications prior to visit.     ROS: Review of Systems  Constitutional:  Negative for fatigue and fever.  HENT:  Positive for congestion and ear pain. Negative for dental problem, ear discharge, facial swelling, hearing loss, mouth sores, nosebleeds, postnasal drip, rhinorrhea, sinus pressure, sinus pain and sore throat.   Respiratory:  Negative for cough and choking.    Objective:  BP 140/76 (BP Location: Right Arm, Patient Position: Sitting, Cuff Size: Normal)    Pulse 77    Temp 98.2 F (36.8 C) (Oral)    Ht 5\' 8"  (1.727 m)    Wt 170 lb (77.1 kg)    SpO2 99%    BMI 25.85 kg/m   BP Readings from Last 3 Encounters:  02/02/21 140/76  02/01/21 130/70  12/13/20 124/82    Wt Readings from Last 3 Encounters:  02/02/21 170 lb (77.1 kg)  02/01/21 174 lb (78.9 kg)  12/13/20 173 lb (78.5 kg)    Physical Exam Constitutional:      Appearance: Normal appearance. He is normal weight.  HENT:     Head: Normocephalic and atraumatic.     Right Ear: Tympanic membrane, ear canal and external ear normal. There is no impacted cerumen.     Left Ear: Tympanic membrane, ear canal and external ear normal. There  is no impacted cerumen.     Nose: No congestion or rhinorrhea.     Mouth/Throat:     Mouth: Mucous membranes are moist.     Pharynx: Oropharynx is clear. No oropharyngeal exudate or posterior oropharyngeal erythema.  Musculoskeletal:     Cervical back: No rigidity.  Head NT  Lab Results  Component Value Date   WBC 7.2 03/25/2020   HGB 16.9 03/25/2020   HCT 50.1 03/25/2020   PLT 201.0 03/25/2020   GLUCOSE 194 (H) 09/17/2020   CHOL 103 09/17/2020   TRIG 119.0 09/17/2020   HDL 46.40 09/17/2020   LDLDIRECT 91.0 08/23/2016   LDLCALC 32 09/17/2020   ALT 16 09/17/2020   AST 14 09/17/2020   NA 139 09/17/2020   K 4.9 09/17/2020   CL 102 09/17/2020   CREATININE 0.87 09/17/2020   BUN 15 09/17/2020   CO2 25 09/17/2020   TSH 2.27 09/17/2020   PSA 0.76 04/25/2007   INR 2.6 09/26/2010   HGBA1C 6.6 (A) 12/07/2020    MICROALBUR 13.0 (H) 09/17/2020    No results found  Assessment & Plan:   Problem List Items Addressed This Visit     Earache on left     trigeminal neuralgia type pain  Afrin nasal spray as needed Tylenol Ibuprofen 400 mg once w/food for trigeminal neuralgia type pain Zpack if worse         Meds ordered this encounter  Medications   azithromycin (ZITHROMAX Z-PAK) 250 MG tablet    Sig: As directed    Dispense:  6 tablet    Refill:  0      Follow-up: No follow-ups on file.  Walker Kehr, MD

## 2021-02-02 NOTE — Assessment & Plan Note (Addendum)
trigeminal neuralgia type pain  Afrin nasal spray as needed Tylenol Ibuprofen 400 mg once w/food for trigeminal neuralgia type pain Zpack if worse

## 2021-02-17 ENCOUNTER — Telehealth: Payer: Medicare Other

## 2021-03-09 DIAGNOSIS — R351 Nocturia: Secondary | ICD-10-CM | POA: Diagnosis not present

## 2021-03-14 DIAGNOSIS — N5201 Erectile dysfunction due to arterial insufficiency: Secondary | ICD-10-CM | POA: Diagnosis not present

## 2021-03-14 DIAGNOSIS — R351 Nocturia: Secondary | ICD-10-CM | POA: Diagnosis not present

## 2021-03-14 DIAGNOSIS — N401 Enlarged prostate with lower urinary tract symptoms: Secondary | ICD-10-CM | POA: Diagnosis not present

## 2021-03-14 DIAGNOSIS — N200 Calculus of kidney: Secondary | ICD-10-CM | POA: Diagnosis not present

## 2021-03-16 ENCOUNTER — Encounter: Payer: Self-pay | Admitting: Internal Medicine

## 2021-03-16 DIAGNOSIS — Z1283 Encounter for screening for malignant neoplasm of skin: Secondary | ICD-10-CM | POA: Diagnosis not present

## 2021-03-16 DIAGNOSIS — D225 Melanocytic nevi of trunk: Secondary | ICD-10-CM | POA: Diagnosis not present

## 2021-03-16 DIAGNOSIS — D2262 Melanocytic nevi of left upper limb, including shoulder: Secondary | ICD-10-CM | POA: Diagnosis not present

## 2021-03-16 DIAGNOSIS — D485 Neoplasm of uncertain behavior of skin: Secondary | ICD-10-CM | POA: Diagnosis not present

## 2021-03-16 NOTE — Patient Instructions (Addendum)
? ? ? ?  Blood work was ordered.   ? ? ? ?Medications changes include :   none ? ? ? ? ?Return in about 6 months (around 09/17/2021) for follow up. ? ?

## 2021-03-16 NOTE — Progress Notes (Signed)
? ? ? ? ?Subjective:  ? ? Patient ID: [name], male    DOB: [birth year], 76 y.o.   MRN: [id number] ? ?This visit occurred during the SARS-CoV-2 public health emergency.  Safety protocols were in place, including screening questions prior to the visit, additional usage of staff PPE, and extensive cleaning of exam room while observing appropriate contact time as indicated for disinfecting solutions.   ? ? ?HPI ?[name] is here for follow up of his chronic medical problems, including DM, htn, hld, hypothyroidism, Afib ? ?He is taking all of his medications as prescribed.  He is walking some.   ? ?Medications and allergies reviewed with patient and updated if appropriate. ? ?Current Outpatient Medications on File Prior to Visit  ?Medication Sig Dispense Refill  ? amLODipine (NORVASC) 10 MG tablet Take 1 tablet (10 mg total) by mouth daily. Keep scheduled appt in Sept must see provider for future refills 90 tablet 3  ? atorvastatin (LIPITOR) 10 MG tablet Take 1 tablet (10 mg total) by mouth daily. Keep scheduled appt in Sept must see provider for future refills 90 tablet 2  ? brimonidine (ALPHAGAN) 0.2 % ophthalmic solution 1 drop 2 (two) times daily.    ? dorzolamide (TRUSOPT) 2 % ophthalmic solution SMARTSIG:In Eye(s)    ? finasteride (PROPECIA) 1 MG tablet Take 0.25 mg by mouth daily.    ? glipiZIDE (GLUCOTROL) 5 MG tablet TAKE 1 TABLET (5 MG TOTAL) BY MOUTH 2 (TWO) TIMES DAILY BEFORE A MEAL. 180 tablet 1  ? JARDIANCE 25 MG TABS tablet TAKE 1 TABLET DAILY BEFORE BREAKFAST. 90 tablet 3  ? Latanoprostene Bunod (VYZULTA) 0.024 % SOLN Place 1 drop into both eyes daily.    ? levothyroxine (SYNTHROID) 88 MCG tablet Take 1 tablet (88 mcg total) by mouth daily. 90 tablet 3  ? lisinopril (ZESTRIL) 2.5 MG tablet Take 1 tablet (2.5 mg total) by mouth daily. 90 tablet 1  ? Magnesium Oxide 500 MG CAPS Take 1 capsule by mouth 2 (two) times daily.    ? metFORMIN (GLUCOPHAGE-XR) 500 MG 24 hr tablet TAKE 2 TABLETS TWICE A DAY  360 tablet 3  ? metoprolol succinate (TOPROL-XL) 100 MG 24 hr tablet TAKE 1 TABLET TWICE A DAY AND AN EXTRA ONE-HALF (1/2) TABLET DAILY AS NEEDED FOR ATRIAL FIBRILLATION 225 tablet 1  ? Semaglutide (RYBELSUS) 14 MG TABS Take 14 mg by mouth daily. 90 tablet 1  ? timolol (TIMOPTIC) 0.5 % ophthalmic solution 1 drop every morning.    ? ?No current facility-administered medications on file prior to visit.  ? ? ? ?Review of Systems  ?Constitutional:  Negative for fever.  ?Respiratory:  Negative for cough, shortness of breath and wheezing.   ?Cardiovascular:  Negative for chest pain, palpitations and leg swelling.  ?Neurological:  Negative for light-headedness and headaches.  ? ?   ?Objective:  ? ?Vitals:  ? 03/17/21 1020  ?BP: 132/72  ?Pulse: 83  ?Temp: 98.1 ?F (36.7 ?C)  ?SpO2: 97%  ? ?BP Readings from Last 3 Encounters:  ?03/17/21 132/72  ?03/17/21 132/90  ?02/02/21 140/76  ? ?Wt Readings from Last 3 Encounters:  ?03/17/21 174 lb (78.9 kg)  ?03/17/21 174 lb (78.9 kg)  ?02/02/21 170 lb (77.1 kg)  ? ?Body mass index is 26.46 kg/m?. ? ?  ?Physical Exam ?Constitutional:   ?   General: He is not in acute distress. ?   Appearance: Normal appearance. He is not ill-appearing.  ?HENT:  ?   Head: Normocephalic  and atraumatic.  ?Eyes:  ?   Conjunctiva/sclera: Conjunctivae normal.  ?Cardiovascular:  ?   Rate and Rhythm: Normal rate and regular rhythm.  ?   Heart sounds: Normal heart sounds. No murmur heard. ?Pulmonary:  ?   Effort: Pulmonary effort is normal. No respiratory distress.  ?   Breath sounds: Normal breath sounds. No wheezing or rales.  ?Musculoskeletal:  ?   Right lower leg: No edema.  ?   Left lower leg: No edema.  ?Skin: ?   General: Skin is warm and dry.  ?   Findings: No rash.  ?Neurological:  ?   Mental Status: He is alert. Mental status is at baseline.  ?Psychiatric:     ?   Mood and Affect: Mood normal.  ? ?   ? ?Lab Results  ?Component Value Date  ? WBC 7.2 03/25/2020  ? HGB 16.9 03/25/2020  ? HCT 50.1  03/25/2020  ? PLT 201.0 03/25/2020  ? GLUCOSE 194 (H) 09/17/2020  ? CHOL 103 09/17/2020  ? TRIG 119.0 09/17/2020  ? HDL 46.40 09/17/2020  ? LDLDIRECT 91.0 08/23/2016  ? [place] 32 09/17/2020  ? ALT 16 09/17/2020  ? AST 14 09/17/2020  ? NA 139 09/17/2020  ? K 4.9 09/17/2020  ? CL 102 09/17/2020  ? CREATININE 0.87 09/17/2020  ? BUN 15 09/17/2020  ? CO2 25 09/17/2020  ? TSH 2.27 09/17/2020  ? PSA 0.76 04/25/2007  ? INR 2.6 09/26/2010  ? HGBA1C 6.6 (A) 12/07/2020  ? MICROALBUR 13.0 (H) 09/17/2020  ? ? ? ?Assessment & Plan:  ? ? ?See Problem List for Assessment and Plan of chronic medical problems.  ? ? ?

## 2021-03-16 NOTE — Progress Notes (Signed)
?Charlann Boxer D.O. ?[facility] ?[place] ?Phone: [phone] ?Subjective:   ?I, [name], am serving as a Education administrator for Dr. Hulan Saas. ?This visit occurred during the SARS-CoV-2 public health emergency.  Safety protocols were in place, including screening questions prior to the visit, additional usage of staff PPE, and extensive cleaning of exam room while observing appropriate contact time as indicated for disinfecting solutions.  ? ?I'm seeing this patient by the request  of:  Binnie Rail, MD ? ?CC: Right knee, left knee ? ?GYK:[id number]  ?02/01/2021 ?Only known arthritic changes but responding well to conservative therapy.  Patient encouraged to try to start increasing activity and see how it responds.  Worsening pain I do think the patient could be a possibility of a candidate for different injections.  He may need even bracing in the long run.  Follow-up with me again in 4 to 6 weeks ? ?Update 03/17/2021 ?[name] is a 76 y.o. male coming in with complaint of R knee pain. Patient states pain is not constant. When in pain more uncomfortable than anything. Left knee is also starting to hurt. Doesn't feel like the knee is giving out also just uncomfortable as well. Hasn't been taking anything. ? ? ?  ? ?Past Medical History:  ?Diagnosis Date  ? AF (atrial fibrillation) ([place])   ? persistent afib, s/p afib ablation x 2 with subsequent convergent procedure at [facility]  ? Allergic rhinitis   ? seasonal  ? Atrial flutter ([place])   ? CTS (carpal tunnel syndrome)   ? PMH of  ? Diabetes mellitus   ? type 2  ? External hemorrhoids   ? Glaucoma   ? Hyperlipidemia   ? Hypertension   ? Hypothyroidism   ? Renal calculus 2005  ? [facility]  ? ?Past Surgical History:  ?Procedure Laterality Date  ? ABLATION  06/2013  ? atypical atrial flutter ablation by Dr Lehman Prom at [facility] (mitral annular flutter ablated)  ? afib 09/22/2008--11/03/2009    ? afib ablation x 2 by Dr Rayann Heman with  subsequent convergent procedure at [facility]  ? CARDIAC CATHETERIZATION  2005  ? non obstructive CAD  ? CARDIOVERSION    ? CARDIOVERSION  12/09/2010  ? Procedure: CARDIOVERSION;  Surgeon: Loralie Champagne, MD;  Location: [place];  Service: Cardiovascular;  Laterality: N/A;  ? CARDIOVERSION  09/15/2011  ? Procedure: CARDIOVERSION;  Surgeon: Josue Hector, MD;  Location: [place];  Service: Cardiovascular;  Laterality: N/A;  office drawing labs wed 8/28  ? CARDIOVERSION N/A 07/18/2012  ? Procedure: CARDIOVERSION;  Surgeon: Thayer Headings, MD;  Location: [place];  Service: Cardiovascular;  Laterality: N/A;  ? CARDIOVERSION  08/2014  ? [place], [place]  ? COLONOSCOPY  2009  ? Dr Sharlett Iles  ? convergent procedure  2013  ? at [facility] for a fib  ? JOINT REPLACEMENT Right 2008, 2012  ? LOOP RECORDER IMPLANT Left 12-2010  ? TONSILLECTOMY    ? TOTAL HIP ARTHROPLASTY  06/2010  ? Depuy prosthesis replacement, Dr Alvan Dame  ? TOTAL HIP ARTHROPLASTY Right 2008  ?  Depuy;Dr Alvan Dame  ? ?Social History  ? ?Socioeconomic History  ? Marital status: Married  ?  Spouse name: [name]  ? Number of children: 2  ? Years of education: Not on file  ? Highest education level: Not on file  ?Occupational History  ? Occupation: BUYER  ?  Employer: [employer]  ?Tobacco Use  ? Smoking  status: Former  ?  Packs/day: 0.90  ?  Years: 8.00  ?  Pack years: 7.20  ?  Types: Cigarettes  ?  Quit date: 01/16/1974  ?  Years since quitting: 47.1  ? Smokeless tobacco: Never  ? Tobacco comments:  ?  smoked  1966- 1976,up to 1 ppd  ?Vaping Use  ? Vaping Use: Never used  ?Substance and Sexual Activity  ? Alcohol use: Yes  ?  Comment: socially  ? Drug use: No  ? Sexual activity: Not on file  ?Other Topics Concern  ? Not on file  ?Social History Narrative  ? Not on file  ? ?Social Determinants of Health  ? ?Financial Resource Strain: Low Risk   ? Difficulty of Paying Living Expenses: Not hard at all  ?Food Insecurity: No Food Insecurity  ? Worried About Charity fundraiser in  the Last Year: Never true  ? Ran Out of Food in the Last Year: Never true  ?Transportation Needs: No Transportation Needs  ? Lack of Transportation (Medical): No  ? Lack of Transportation (Non-Medical): No  ?Physical Activity: Sufficiently Active  ? Days of Exercise per Week: 7 days  ? Minutes of Exercise per Session: 30 min  ?Stress: No Stress Concern Present  ? Feeling of Stress : Not at all  ?Social Connections: Moderately Isolated  ? Frequency of Communication with Friends and Family: More than three times a week  ? Frequency of Social Gatherings with Friends and Family: Never  ? Attends Religious Services: Never  ? Active Member of Clubs or Organizations: No  ? Attends Archivist Meetings: Never  ? Marital Status: Married  ? ?Allergies  ?Allergen Reactions  ? Wilder Glade [Dapagliflozin] Other (See Comments)  ?  Dry mouth, increased urination, increased appetite  ? ?Family History  ?Problem Relation Age of Onset  ? Diabetes Father   ? Stroke Father   ?     mid 65s  ? Lung cancer Mother   ?     non smoker  ? Stroke Mother 49  ? Testicular cancer Brother   ? Allergies Maternal Grandmother   ? Cancer Maternal Grandmother   ?     unsure  ? Allergies Brother   ? Heart attack Neg Hx   ? ? ?Current Outpatient Medications (Endocrine & Metabolic):  ?  glipiZIDE (GLUCOTROL) 5 MG tablet, TAKE 1 TABLET (5 MG TOTAL) BY MOUTH 2 (TWO) TIMES DAILY BEFORE A MEAL. ?  JARDIANCE 25 MG TABS tablet, TAKE 1 TABLET DAILY BEFORE BREAKFAST. ?  levothyroxine (SYNTHROID) 88 MCG tablet, Take 1 tablet (88 mcg total) by mouth daily. ?  metFORMIN (GLUCOPHAGE-XR) 500 MG 24 hr tablet, TAKE 2 TABLETS TWICE A DAY ?  Semaglutide (RYBELSUS) 14 MG TABS, Take 14 mg by mouth daily. ? ?Current Outpatient Medications (Cardiovascular):  ?  amLODipine (NORVASC) 10 MG tablet, Take 1 tablet (10 mg total) by mouth daily. Keep scheduled appt in Sept must see provider for future refills ?  atorvastatin (LIPITOR) 10 MG tablet, Take 1 tablet (10 mg total)  by mouth daily. Keep scheduled appt in Sept must see provider for future refills ?  lisinopril (ZESTRIL) 2.5 MG tablet, Take 1 tablet (2.5 mg total) by mouth daily. ?  metoprolol succinate (TOPROL-XL) 100 MG 24 hr tablet, TAKE 1 TABLET TWICE A DAY AND AN EXTRA ONE-HALF (1/2) TABLET DAILY AS NEEDED FOR ATRIAL FIBRILLATION ? ? ? ?Current Outpatient Medications (Hematological):  ?  rivaroxaban (XARELTO) 20 MG TABS tablet,  TAKE 1 TABLET DAILY WITH SUPPER ? ?Current Outpatient Medications (Other):  ?  brimonidine (ALPHAGAN) 0.2 % ophthalmic solution, 1 drop 2 (two) times daily. ?  dorzolamide (TRUSOPT) 2 % ophthalmic solution, SMARTSIG:In Eye(s) ?  finasteride (PROPECIA) 1 MG tablet, Take 0.25 mg by mouth daily. ?  Latanoprostene Bunod (VYZULTA) 0.024 % SOLN, Place 1 drop into both eyes daily. ?  Magnesium Oxide 500 MG CAPS, Take 1 capsule by mouth 2 (two) times daily. ?  timolol (TIMOPTIC) 0.5 % ophthalmic solution, 1 drop every morning. ? ? ? ? ?Objective  ?Blood pressure 132/90, pulse 76, height 5\' 8"  (1.727 m), weight 174 lb (78.9 kg), SpO2 98 %. ?  ?General: No apparent distress alert and oriented x3 mood and affect normal, dressed appropriately.  ?HEENT: Pupils equal, extraocular movements intact  ?Respiratory: Patient's speak in full sentences and does not appear short of breath  ?Cardiovascular: No lower extremity edema, non tender, no erythema  ?Gait normal with good balance and coordination.  ?MSK:  N bilateral knee exam show the patient does have mild lateral tracking of the patella.  Patient does have a small effusion noted of the left knee compared to the right knee.  This is different than previous exam. ? ?  ?Impression and Recommendations:  ?  ? ?The above documentation has been reviewed and is accurate and complete Lyndal Pulley, DO ? ? ? ?

## 2021-03-17 ENCOUNTER — Other Ambulatory Visit: Payer: Self-pay

## 2021-03-17 ENCOUNTER — Ambulatory Visit (INDEPENDENT_AMBULATORY_CARE_PROVIDER_SITE_OTHER): Payer: Medicare Other | Admitting: Family Medicine

## 2021-03-17 ENCOUNTER — Ambulatory Visit (INDEPENDENT_AMBULATORY_CARE_PROVIDER_SITE_OTHER): Payer: Medicare Other | Admitting: Internal Medicine

## 2021-03-17 VITALS — BP 132/72 | HR 83 | Temp 98.1°F | Wt 174.0 lb

## 2021-03-17 DIAGNOSIS — E7849 Other hyperlipidemia: Secondary | ICD-10-CM

## 2021-03-17 DIAGNOSIS — E039 Hypothyroidism, unspecified: Secondary | ICD-10-CM

## 2021-03-17 DIAGNOSIS — R809 Proteinuria, unspecified: Secondary | ICD-10-CM

## 2021-03-17 DIAGNOSIS — I4891 Unspecified atrial fibrillation: Secondary | ICD-10-CM | POA: Diagnosis not present

## 2021-03-17 DIAGNOSIS — I10 Essential (primary) hypertension: Secondary | ICD-10-CM

## 2021-03-17 DIAGNOSIS — E1129 Type 2 diabetes mellitus with other diabetic kidney complication: Secondary | ICD-10-CM

## 2021-03-17 DIAGNOSIS — M1711 Unilateral primary osteoarthritis, right knee: Secondary | ICD-10-CM | POA: Diagnosis not present

## 2021-03-17 LAB — LIPID PANEL
Cholesterol: 118 mg/dL (ref 0–200)
HDL: 51 mg/dL (ref >39.00)
LDL Cholesterol: 47 mg/dL (ref 0–99)
NonHDL: 66.79
Total CHOL/HDL Ratio: 2
Triglycerides: 101 mg/dL (ref 0.0–149.0)
VLDL: 20.2 mg/dL (ref 0.0–40.0)

## 2021-03-17 LAB — CBC WITH DIFFERENTIAL/PLATELET
Basophils Absolute: 0.1 10*3/uL (ref 0.0–0.1)
Basophils Relative: 2.2 % (ref 0.0–3.0)
Eosinophils Absolute: 0.5 10*3/uL (ref 0.0–0.7)
Eosinophils Relative: 7.3 % — ABNORMAL HIGH (ref 0.0–5.0)
HCT: 50.4 % (ref 39.0–52.0)
Hemoglobin: 16.7 g/dL (ref 13.0–17.0)
Lymphocytes Relative: 25.9 % (ref 12.0–46.0)
Lymphs Abs: 1.7 10*3/uL (ref 0.7–4.0)
MCHC: 33.1 g/dL (ref 30.0–36.0)
MCV: 89.3 fl (ref 78.0–100.0)
Monocytes Absolute: 0.7 10*3/uL (ref 0.1–1.0)
Monocytes Relative: 10 % (ref 3.0–12.0)
Neutro Abs: 3.6 10*3/uL (ref 1.4–7.7)
Neutrophils Relative %: 54.6 % (ref 43.0–77.0)
Platelets: 209 10*3/uL (ref 150.0–400.0)
RBC: 5.65 Mil/uL (ref 4.22–5.81)
RDW: 14.7 % (ref 11.5–15.5)
WBC: 6.7 10*3/uL (ref 4.0–10.5)

## 2021-03-17 LAB — COMPREHENSIVE METABOLIC PANEL
ALT: 13 U/L (ref 0–53)
AST: 17 U/L (ref 0–37)
Albumin: 4.7 g/dL (ref 3.5–5.2)
Alkaline Phosphatase: 49 U/L (ref 39–117)
BUN: 24 mg/dL — ABNORMAL HIGH (ref 6–23)
CO2: 26 mEq/L (ref 19–32)
Calcium: 10.3 mg/dL (ref 8.4–10.5)
Chloride: 100 mEq/L (ref 96–112)
Creatinine, Ser: 0.92 mg/dL (ref 0.40–1.50)
GFR: 82 mL/min (ref >60.00)
Glucose, Bld: 112 mg/dL — ABNORMAL HIGH (ref 70–99)
Potassium: 4.3 mEq/L (ref 3.5–5.1)
Sodium: 137 mEq/L (ref 135–145)
Total Bilirubin: 0.7 mg/dL (ref 0.2–1.2)
Total Protein: 7.8 g/dL (ref 6.0–8.3)

## 2021-03-17 LAB — TSH: TSH: 1.37 u[IU]/mL (ref 0.35–5.50)

## 2021-03-17 MED ORDER — RIVAROXABAN 20 MG PO TABS: ORAL_TABLET | ORAL | 3 refills | Status: DC

## 2021-03-17 NOTE — Assessment & Plan Note (Signed)
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  Discussed icing regimen and home exercises, discussed which activities to do and which ones to avoid, increase activity slowly.  Follow-up with me again in 6 to 8 weeks worsening pain can consider the possibility of injections. ?

## 2021-03-17 NOTE — Patient Instructions (Signed)
Good to see you! ?Glad overall you're doing okay ?Try to do exercises on left knee ?Ice after activity ?See you again in 7-8 weeks just in case ?

## 2021-03-17 NOTE — Assessment & Plan Note (Signed)
Chronic ?Blood pressure well controlled ?CMP ?Continue amlodipine 10 mg daily lisinopril 2.5 mg daily, metoprolol XL 100 mg daily ?

## 2021-03-17 NOTE — Assessment & Plan Note (Signed)
Chronic ?Following with cardiology ?Continue Xarelto 20 mg daily, metoprolol XL 100 mg daily ?CBC, CMP, TSH ?

## 2021-03-17 NOTE — Assessment & Plan Note (Signed)
Chronic ?Sugars well controlled ?Management per endocrine ?

## 2021-03-17 NOTE — Assessment & Plan Note (Signed)
Chronic  Clinically euthyroid Currently taking levothyroxine 88 mcg daily Check tsh  Titrate med dose if needed  

## 2021-03-17 NOTE — Assessment & Plan Note (Signed)
Chronic Regular exercise and healthy diet encouraged Check lipid panel  Continue atorvastatin 10 mg daily 

## 2021-03-18 DIAGNOSIS — U071 COVID-19: Secondary | ICD-10-CM | POA: Diagnosis not present

## 2021-03-25 DIAGNOSIS — L905 Scar conditions and fibrosis of skin: Secondary | ICD-10-CM | POA: Diagnosis not present

## 2021-03-25 DIAGNOSIS — D485 Neoplasm of uncertain behavior of skin: Secondary | ICD-10-CM | POA: Diagnosis not present

## 2021-03-27 ENCOUNTER — Other Ambulatory Visit: Payer: Self-pay | Admitting: Internal Medicine

## 2021-03-28 ENCOUNTER — Encounter: Payer: Self-pay | Admitting: Internal Medicine

## 2021-03-28 ENCOUNTER — Other Ambulatory Visit: Payer: Self-pay

## 2021-03-28 MED ORDER — EMPAGLIFLOZIN 25 MG PO TABS: 25.0000 mg | ORAL_TABLET | Freq: Every day | ORAL | 1 refills | Status: DC

## 2021-03-28 MED ORDER — GLIPIZIDE 5 MG PO TABS: 5.0000 mg | ORAL_TABLET | Freq: Two times a day (BID) | ORAL | 1 refills | Status: DC

## 2021-03-29 ENCOUNTER — Other Ambulatory Visit: Payer: Self-pay

## 2021-03-29 DIAGNOSIS — I10 Essential (primary) hypertension: Secondary | ICD-10-CM

## 2021-03-29 DIAGNOSIS — E039 Hypothyroidism, unspecified: Secondary | ICD-10-CM

## 2021-03-29 MED ORDER — LISINOPRIL 2.5 MG PO TABS: 2.5000 mg | ORAL_TABLET | Freq: Every day | ORAL | 1 refills | Status: DC

## 2021-03-29 MED ORDER — METFORMIN HCL ER 500 MG PO TB24: ORAL_TABLET | ORAL | 3 refills | Status: DC

## 2021-03-29 MED ORDER — LEVOTHYROXINE SODIUM 88 MCG PO TABS: 88.0000 ug | ORAL_TABLET | Freq: Every day | ORAL | 3 refills | Status: DC

## 2021-04-11 DIAGNOSIS — H401111 Primary open-angle glaucoma, right eye, mild stage: Secondary | ICD-10-CM | POA: Diagnosis not present

## 2021-04-13 ENCOUNTER — Telehealth: Payer: Self-pay | Admitting: Internal Medicine

## 2021-04-13 NOTE — Telephone Encounter (Signed)
Clinical pharmacist to review Xarelto 

## 2021-04-13 NOTE — Telephone Encounter (Signed)
? ?  Pre-operative Risk Assessment  ?  ?Patient Name: JARRICK FJELD  ?DOB: 1945-10-07 ?MRN: [id number]  ? ?  ? ?Request for Surgical Clearance   ? ?Procedure:  Ureteroscopy ? ?Date of Surgery:  Clearance 05/06/21                              ?   ?Surgeon:  Dr. Louis Meckel ?Surgeon's Group or Practice Name:  [facility] ?Phone number:  [phone]. [phone] ?Fax number:  [phone] ?  ?Type of Clearance Requested:   ?- Medical  ?- Pharmacy:  Hold Rivaroxaban (Xarelto) 3 Days Prior ?  ?Type of Anesthesia:  General  ?  ?Additional requests/questions:  Please fax a copy of medical clearance to the surgeon's office. ? ?Signed, ?Durel Salts   ?04/13/2021, 10:06 AM   ?

## 2021-04-13 NOTE — Telephone Encounter (Signed)
Patient with diagnosis of afib on Xarelto for anticoagulation.   ? ?Procedure: ureteroscopy ?Date of procedure: 05/06/21 ? ?CHA2DS2-VASc Score = 4  ?This indicates a 4.8% annual risk of stroke. ?The patient's score is based upon: ?CHF History: 0 ?HTN History: 1 ?Diabetes History: 1 ?Stroke History: 0 ?Vascular Disease History: 1 ?Age Score: 1 ?Gender Score: 0 ?  ?CrCl 39m/min ?Platelet count 209K ? ?Per office protocol, patient can hold Xarelto for 3 days prior to procedure as requested assuming no afib related procedures are scheduled at upcoming MD visit on 3/31. ?

## 2021-04-13 NOTE — Telephone Encounter (Signed)
Patient has upcoming visit with Dr. Caryl Comes on 04/15/2021, cardiac clearance can be addressed by MD at that time. ?

## 2021-04-15 ENCOUNTER — Encounter: Payer: Self-pay | Admitting: Internal Medicine

## 2021-04-15 ENCOUNTER — Ambulatory Visit (INDEPENDENT_AMBULATORY_CARE_PROVIDER_SITE_OTHER): Payer: Medicare Other | Admitting: Internal Medicine

## 2021-04-15 VITALS — BP 160/80 | HR 82 | Ht 68.0 in | Wt 172.0 lb

## 2021-04-15 DIAGNOSIS — I484 Atypical atrial flutter: Secondary | ICD-10-CM

## 2021-04-15 DIAGNOSIS — I4819 Other persistent atrial fibrillation: Secondary | ICD-10-CM

## 2021-04-15 NOTE — Patient Instructions (Signed)
Medication Instructions:  ?Your physician recommends that you continue on your current medications as directed. Please refer to the Current Medication list given to you today. ? ?*If you need a refill on your cardiac medications before your next appointment, please call your pharmacy* ? ? ?Lab Work: ?None ordered ? ? ?Testing/Procedures: ?None ordered ? ? ?Follow-Up: ?At [facility], you and your health needs are our priority.  As part of our continuing mission to provide you with exceptional heart care, we have created designated Provider Care Teams.  These Care Teams include your primary Cardiologist (physician) and Advanced Practice Providers (APPs -  Physician Assistants and Nurse Practitioners) who all work together to provide you with the care you need, when you need it. ? ?Your next appointment:   ?1 year(s) ? ?The format for your next appointment:   ?In Person ? ?Provider:   ?Virl Axe, MD ? ? ? ?Thank you for choosing [facility]!! ? ? ?[phone] ? ?

## 2021-04-15 NOTE — Progress Notes (Signed)
? ? ? ? ?Patient Care Team: ?Binnie Rail, MD as PCP - General (Internal Medicine) ?Thompson Grayer, MD as PCP - Cardiology (Cardiology) ?Juluis Rainier as Consulting Physician (Optometry) ?Foltanski, Cleaster Corin, [facility] as Pharmacist (Pharmacist) ? ? ?HPI ? ?[name] is a 76 y.o. male s/p Ablation x 2   and convergent at [facility]; anticoagulation with Rivaroxaban, Hx of previous reveal [employer] DOI 2012, no interval significant  atrial fibrillation which  he is aware; no bleeding ? ?The patient denies chest pain, shortness of breath, nocturnal dyspnea, orthopnea or peripheral edema.  There have been no palpitations, lightheadedness or syncope.  He is not very active. ? ?Alcohol infrequent; no snoring ? ? ? ?Date Cr K Hgb  ?3/23 0.92 4.3 16.7  ?      ? ?DATE TEST EF   ?2/12 Echo   60-65 %   ?     ?     ? ?Thromboembolic risk factors ( age -50, HTN-1,   DM-1, Vasc disease -1) for a CHADSVASc Score of 4 ? ?Records and Results Reviewed  ? ?Past Medical History:  ?Diagnosis Date  ? AF (atrial fibrillation) ([place])   ? persistent afib, s/p afib ablation x 2 with subsequent convergent procedure at [facility]  ? Allergic rhinitis   ? seasonal  ? Atrial flutter ([place])   ? CTS (carpal tunnel syndrome)   ? PMH of  ? Diabetes mellitus   ? type 2  ? External hemorrhoids   ? Glaucoma   ? Hyperlipidemia   ? Hypertension   ? Hypothyroidism   ? Renal calculus 2005  ? [facility]  ? ? ?Past Surgical History:  ?Procedure Laterality Date  ? ABLATION  06/2013  ? atypical atrial flutter ablation by Dr Lehman Prom at [facility] (mitral annular flutter ablated)  ? afib 09/22/2008--11/03/2009    ? afib ablation x 2 by Dr Rayann Heman with subsequent convergent procedure at [facility]  ? CARDIAC CATHETERIZATION  2005  ? non obstructive CAD  ? CARDIOVERSION    ? CARDIOVERSION  12/09/2010  ? Procedure: CARDIOVERSION;  Surgeon: Loralie Champagne, MD;  Location: [place];  Service: Cardiovascular;  Laterality: N/A;  ? CARDIOVERSION  09/15/2011  ? Procedure: CARDIOVERSION;   Surgeon: Josue Hector, MD;  Location: [place];  Service: Cardiovascular;  Laterality: N/A;  office drawing labs wed 8/28  ? CARDIOVERSION N/A 07/18/2012  ? Procedure: CARDIOVERSION;  Surgeon: Thayer Headings, MD;  Location: [place];  Service: Cardiovascular;  Laterality: N/A;  ? CARDIOVERSION  08/2014  ? [place], [place]  ? COLONOSCOPY  2009  ? Dr Sharlett Iles  ? convergent procedure  2013  ? at [facility] for a fib  ? JOINT REPLACEMENT Right 2008, 2012  ? LOOP RECORDER IMPLANT Left 12-2010  ? TONSILLECTOMY    ? TOTAL HIP ARTHROPLASTY  06/2010  ? Depuy prosthesis replacement, Dr Alvan Dame  ? TOTAL HIP ARTHROPLASTY Right 2008  ?  Depuy;Dr Alvan Dame  ? ? ?Current Meds  ?Medication Sig  ? amLODipine (NORVASC) 10 MG tablet Take 1 tablet (10 mg total) by mouth daily. Keep scheduled appt in Sept must see provider for future refills  ? atorvastatin (LIPITOR) 10 MG tablet Take 1 tablet (10 mg total) by mouth daily. Keep scheduled appt in Sept must see provider for future refills  ? brimonidine (ALPHAGAN) 0.2 % ophthalmic solution 1 drop 2 (two) times daily.  ? dorzolamide (TRUSOPT) 2 % ophthalmic solution SMARTSIG:In Eye(s)  ? empagliflozin (JARDIANCE) 25 MG TABS tablet  Take 1 tablet (25 mg total) by mouth daily before breakfast.  ? finasteride (PROPECIA) 1 MG tablet Take 0.25 mg by mouth daily.  ? glipiZIDE (GLUCOTROL) 5 MG tablet Take 1 tablet (5 mg total) by mouth 2 (two) times daily before a meal.  ? Latanoprostene Bunod (VYZULTA) 0.024 % SOLN Place 1 drop into both eyes daily.  ? levothyroxine (SYNTHROID) 88 MCG tablet Take 1 tablet (88 mcg total) by mouth daily.  ? lisinopril (ZESTRIL) 2.5 MG tablet Take 1 tablet (2.5 mg total) by mouth daily.  ? Magnesium Oxide 500 MG CAPS Take 1 capsule by mouth 2 (two) times daily.  ? metFORMIN (GLUCOPHAGE-XR) 500 MG 24 hr tablet TAKE 2 TABLETS TWICE A DAY  ? metoprolol succinate (TOPROL-XL) 100 MG 24 hr tablet TAKE 1 TABLET TWICE A DAY  AND AN EXTRA 1/2 TABLET    DAILY AS NEEDED FOR ATRIAL  FIBRILLATION  ? rivaroxaban (XARELTO) 20 MG TABS tablet TAKE 1 TABLET DAILY WITH SUPPER  ? Semaglutide (RYBELSUS) 14 MG TABS Take 14 mg by mouth daily.  ? timolol (TIMOPTIC) 0.5 % ophthalmic solution 1 drop every morning.  ? ? ?Allergies  ?Allergen Reactions  ? Wilder Glade [Dapagliflozin] Other (See Comments)  ?  Dry mouth, increased urination, increased appetite  ? ? ? ? ?Review of Systems negative except from HPI and PMH ? ?Physical Exam ?BP (!) 160/80   Pulse 82   Ht '5\' 8"'$  (1.727 m)   Wt 172 lb (78 kg)   SpO2 97%   BMI 26.15 kg/m?  ?Well developed and well nourished in no acute distress ?HENT normal ?E scleral and icterus clear ?Neck Supple ?JVP flat; carotids brisk and full ?Clear to ausculation ?Regular rate and rhythm, no murmurs gallops or rub ?Soft with active bowel sounds ?No clubbing cyanosis  Edema ?Alert and oriented, grossly normal motor and sensory function ?Skin Warm and Dry ? ?ECG sinus at 82 ?Intervals 20/12/38 ?Right bundle branch block-  ?Noted as long ago as 2017 ? ?CrCl cannot be calculated (Patient's most recent lab result is older than the maximum 21 days allowed.). ? ? ?Assessment and  Plan ? ?Atrial Fibrillation s/p PVI x 2 and Convergent ? ?HTN ? ?Reveal DOI 2012 abandoned ? ? ?No significant atrial fibrillation of which he is aware; he remains on Xarelto anticoagulation without clinical bleeding.  We will continue at 20 mg daily. ? ?Blood pressure is elevated here.  At home however it is in the 120s.  We will continue him on amlodipine 10 lisinopril 2.5 also for renal protection and metoprolol 100 twice daily. ? ?Encouraged exercise ? ? ? ? ? ?Current medicines are reviewed at length with the patient today .  The patient does not  have concerns regarding medicines. ? ?

## 2021-04-21 DIAGNOSIS — U071 COVID-19: Secondary | ICD-10-CM | POA: Diagnosis not present

## 2021-04-22 ENCOUNTER — Encounter: Payer: Self-pay | Admitting: Internal Medicine

## 2021-04-24 ENCOUNTER — Other Ambulatory Visit: Payer: Self-pay

## 2021-04-24 MED ORDER — RYBELSUS 14 MG PO TABS: 14.0000 mg | ORAL_TABLET | Freq: Every day | ORAL | 1 refills | Status: DC

## 2021-04-25 ENCOUNTER — Other Ambulatory Visit: Payer: Self-pay

## 2021-04-25 MED ORDER — RYBELSUS 14 MG PO TABS: 14.0000 mg | ORAL_TABLET | Freq: Every day | ORAL | 1 refills | Status: DC

## 2021-04-25 NOTE — Telephone Encounter (Signed)
? ?  Primary Cardiologist: Thompson Grayer, MD ? ?Chart reviewed as part of pre-operative protocol coverage. Given past medical history and time since last visit, based on ACC/AHA guidelines, OSHAE SIMMERING would be at acceptable risk for the planned procedure without further cardiovascular testing.  ? ?Guidelines for holding Xarelto prior to procedure are as follows:  ? ?Per office protocol, patient can hold Xarelto for 3 days prior to procedure as requested assuming no afib related procedures are scheduled at upcoming MD visit on 3/31 (patient reported no problems).  ? ?I will route this recommendation to the requesting party via Epic fax function and remove from pre-op pool. ? ?Please call with questions. ? ?Emmaline Life, NP-C ? ?  ?04/25/2021, 1:48 PM ?[place] ?[address], [address] ?Office [phone] Fax [phone] ? ? ? ?

## 2021-05-06 DIAGNOSIS — N200 Calculus of kidney: Secondary | ICD-10-CM | POA: Diagnosis not present

## 2021-05-06 DIAGNOSIS — N130 Hydronephrosis with ureteropelvic junction obstruction: Secondary | ICD-10-CM | POA: Diagnosis not present

## 2021-05-12 ENCOUNTER — Encounter: Payer: Self-pay | Admitting: Internal Medicine

## 2021-05-12 ENCOUNTER — Ambulatory Visit (INDEPENDENT_AMBULATORY_CARE_PROVIDER_SITE_OTHER): Payer: Medicare Other | Admitting: Internal Medicine

## 2021-05-12 VITALS — BP 140/90 | HR 80 | Ht 68.0 in | Wt 169.8 lb

## 2021-05-12 DIAGNOSIS — R739 Hyperglycemia, unspecified: Secondary | ICD-10-CM

## 2021-05-12 DIAGNOSIS — E1129 Type 2 diabetes mellitus with other diabetic kidney complication: Secondary | ICD-10-CM

## 2021-05-12 LAB — POCT GLYCOSYLATED HEMOGLOBIN (HGB A1C): Hemoglobin A1C: 6.8 % — AB (ref 4.0–5.6)

## 2021-05-12 NOTE — Patient Instructions (Signed)
?-   Continue Glipizide 5 mg, 1 tablet before Breakfast and 1 tablet before  ?- Continue Metformin 500 mg , 2 tablets twice a day  ?- Continue  Rybelsus to 14 mg daily  ?- Continue  Jardiance 25 mg, 1 tablet with Breakfast  ? ? ?HOW TO TREAT LOW BLOOD SUGARS (Blood sugar LESS THAN 70 MG/DL) ?Please follow the RULE OF 15 for the treatment of hypoglycemia treatment (when your (blood sugars are less than 70 mg/dL)  ? ?STEP 1: Take 15 grams of carbohydrates when your blood sugar is low, which includes:  ?3-4 GLUCOSE TABS  OR ?3-4 OZ OF JUICE OR REGULAR SODA OR ?ONE TUBE OF GLUCOSE GEL   ? ?STEP 2: RECHECK blood sugar in 15 MINUTES ?STEP 3: If your blood sugar is still low at the 15 minute recheck --> then, go back to STEP 1 and treat AGAIN with another 15 grams of carbohydrates. ? ?

## 2021-05-12 NOTE — Progress Notes (Signed)
?Name: [name]  ?Age/ Sex: 76 y.o., male   ?MRN/ DOB: [id number], [birth year]    ? ?PCP: Binnie Rail, MD   ?Reason for Endocrinology Evaluation: Type 2 Diabetes Mellitus  ?Initial Endocrine Consultative Visit: 10/28/2019  ? ? ?PATIENT IDENTIFIER: [name] is a 76 y.o. male with a past medical history of T2Dm, A.Fib  and Dyslipidemia.  The patient has followed with Endocrinology clinic since 10/28/2019 for consultative assistance with management of his diabetes. ? ?DIABETIC HISTORY:  ?[name] was diagnosed with DM many years ago.Farxiga- dry mouth and frequency. His hemoglobin A1c has ranged from 6.5% in 2015, peaking at 8.5% in 2018. ? ? ? ?On his initial visit to our clinic his A1c was 7.6% He had concerns about the cost of Jardiance, [place].  ? ? ?He was on Glimepiride and Metformin and we switched Glimepiride to Glipizide  ? ?Rybelsus started 06/2020 ? ?SUBJECTIVE:  ? ?During the last visit (12/07/2020): A1c 6.6% Continued  Jardiance, Rybelsus , Metformin and Glipizide  ? ? ? ? ? ?Today (05/12/2021): [name] is here for a follow up on diabetes management.  He checks his blood sugars 1 times daily.  The patient has not had hypoglycemic episodes since the last clinic visit.  ? ? ?He had a follow-up with her care for A-fib on 04/15/2021 ? ?For the last 2 weeks had an episode of nausea, vomiting or diarrhea right before renal laparoscopy for renal stones which was unsuccessful  ?Currently with renal pain and hematuria  ?Nausea, vomiting or diarrhea have been improving  ? ? ? ?HOME DIABETES REGIMEN:  ?Glipizide 5 mg, 1 tablet BID ?Metformin 500 mg , 2 tablets twice a day  ?Jardiance 25 mg daily  ?Rybelsus 14 mg daily  ? ? ? ?Statin: Yes ?ACE-I/ARB: no ? ? ? ?METER DOWNLOAD SUMMARY:  ?[phone] ? ?DIABETIC COMPLICATIONS: ?Microvascular complications:  ? ?Denies: CKD, retinopathy, neuropathy ?Last Eye Exam: Completed 11/2020 ? ?Macrovascular complications:  ? ?Denies: CAD,  CVA, PVD ? ? ?HISTORY:  ?Past Medical History:  ?Past Medical History:  ?Diagnosis Date  ? AF (atrial fibrillation) ([place])   ? persistent afib, s/p afib ablation x 2 with subsequent convergent procedure at [facility]  ? Allergic rhinitis   ? seasonal  ? Atrial flutter ([place])   ? CTS (carpal tunnel syndrome)   ? PMH of  ? Diabetes mellitus   ? type 2  ? External hemorrhoids   ? Glaucoma   ? Hyperlipidemia   ? Hypertension   ? Hypothyroidism   ? Renal calculus 2005  ? [facility]  ? ?Past Surgical History:  ?Past Surgical History:  ?Procedure Laterality Date  ? ABLATION  06/2013  ? atypical atrial flutter ablation by Dr Lehman Prom at [facility] (mitral annular flutter ablated)  ? afib 09/22/2008--11/03/2009    ? afib ablation x 2 by Dr Rayann Heman with subsequent convergent procedure at [facility]  ? CARDIAC CATHETERIZATION  2005  ? non obstructive CAD  ? CARDIOVERSION    ? CARDIOVERSION  12/09/2010  ? Procedure: CARDIOVERSION;  Surgeon: Loralie Champagne, MD;  Location: [place];  Service: Cardiovascular;  Laterality: N/A;  ? CARDIOVERSION  09/15/2011  ? Procedure: CARDIOVERSION;  Surgeon: Josue Hector, MD;  Location: [place];  Service: Cardiovascular;  Laterality: N/A;  office drawing labs wed 8/28  ? CARDIOVERSION N/A 07/18/2012  ? Procedure: CARDIOVERSION;  Surgeon: Thayer Headings, MD;  Location: [place];  Service: Cardiovascular;  Laterality: N/A;  ?  CARDIOVERSION  08/2014  ? [place], [place]  ? COLONOSCOPY  2009  ? Dr Sharlett Iles  ? convergent procedure  2013  ? at [facility] for a fib  ? JOINT REPLACEMENT Right 2008, 2012  ? LOOP RECORDER IMPLANT Left 12-2010  ? TONSILLECTOMY    ? TOTAL HIP ARTHROPLASTY  06/2010  ? Depuy prosthesis replacement, Dr Alvan Dame  ? TOTAL HIP ARTHROPLASTY Right 2008  ?  Depuy;Dr Alvan Dame  ? ?Social History:  reports that he quit smoking about 47 years ago. His smoking use included cigarettes. He has a 7.20 pack-year smoking history. He has never used smokeless tobacco. He reports current alcohol use. He reports that he does not use  drugs. ?Family History:  ?Family History  ?Problem Relation Age of Onset  ? Diabetes Father   ? Stroke Father   ?     mid 48s  ? Lung cancer Mother   ?     non smoker  ? Stroke Mother 22  ? Testicular cancer Brother   ? Allergies Maternal Grandmother   ? Cancer Maternal Grandmother   ?     unsure  ? Allergies Brother   ? Heart attack Neg Hx   ? ? ? ?HOME MEDICATIONS: ?Allergies as of 05/12/2021   ? ?   Reactions  ? Wilder Glade [dapagliflozin] Other (See Comments)  ? Dry mouth, increased urination, increased appetite  ? ?  ? ?  ?Medication List  ?  ? ?  ? Accurate as of May 12, 2021  9:27 AM. If you have any questions, ask your nurse or doctor.  ?  ?  ? ?  ? ?amLODipine 10 MG tablet ?Commonly known as: NORVASC ?Take 1 tablet (10 mg total) by mouth daily. Keep scheduled appt in Sept must see provider for future refills ?  ?atorvastatin 10 MG tablet ?Commonly known as: LIPITOR ?Take 1 tablet (10 mg total) by mouth daily. Keep scheduled appt in Sept must see provider for future refills ?  ?brimonidine 0.2 % ophthalmic solution ?Commonly known as: ALPHAGAN ?1 drop 2 (two) times daily. ?  ?dorzolamide 2 % ophthalmic solution ?Commonly known as: TRUSOPT ?SMARTSIG:In Eye(s) ?  ?empagliflozin 25 MG Tabs tablet ?Commonly known as: Jardiance ?Take 1 tablet (25 mg total) by mouth daily before breakfast. ?  ?finasteride 1 MG tablet ?Commonly known as: PROPECIA ?Take 0.25 mg by mouth daily. ?  ?glipiZIDE 5 MG tablet ?Commonly known as: GLUCOTROL ?Take 1 tablet (5 mg total) by mouth 2 (two) times daily before a meal. ?  ?levothyroxine 88 MCG tablet ?Commonly known as: SYNTHROID ?Take 1 tablet (88 mcg total) by mouth daily. ?  ?lisinopril 2.5 MG tablet ?Commonly known as: ZESTRIL ?Take 1 tablet (2.5 mg total) by mouth daily. ?  ?Magnesium Oxide 500 MG Caps ?Take 1 capsule by mouth 2 (two) times daily. ?  ?metFORMIN 500 MG 24 hr tablet ?Commonly known as: GLUCOPHAGE-XR ?TAKE 2 TABLETS TWICE A DAY ?  ?metoprolol succinate 100 MG 24 hr  tablet ?Commonly known as: TOPROL-XL ?TAKE 1 TABLET TWICE A DAY  AND AN EXTRA 1/2 TABLET    DAILY AS NEEDED FOR ATRIAL FIBRILLATION ?  ?rivaroxaban 20 MG Tabs tablet ?Commonly known as: Xarelto ?TAKE 1 TABLET DAILY WITH SUPPER ?  ?Rybelsus 14 MG Tabs ?Generic drug: Semaglutide ?Take 1 tablet (14 mg total) by mouth daily. ?  ?tamsulosin 0.4 MG Caps capsule ?Commonly known as: FLOMAX ?Take 0.4 mg by mouth daily. ?  ?timolol 0.5 % ophthalmic solution ?Commonly known  as: TIMOPTIC ?1 drop every morning. ?  ?traMADol 50 MG tablet ?Commonly known as: ULTRAM ?Take by mouth. ?  ?Vyzulta 0.024 % Soln ?Generic drug: Latanoprostene Bunod ?Place 1 drop into both eyes daily. ?  ? ?  ? ? ? ?OBJECTIVE:  ? ?Vital Signs: BP 140/90 (BP Location: Left Arm, Patient Position: Sitting, Cuff Size: Large)   Pulse 80   Ht '5\' 8"'$  (1.727 m)   Wt 169 lb 12.8 oz (77 kg)   SpO2 99%   BMI 25.82 kg/m?   ?Wt Readings from Last 3 Encounters:  ?05/12/21 169 lb 12.8 oz (77 kg)  ?04/15/21 172 lb (78 kg)  ?03/17/21 174 lb (78.9 kg)  ? ? ? ?Exam: ?General: Pt appears well and is in NAD  ?Lungs: Clear with good BS bilat   ?Heart: RRR   ?Extremities: No pretibial edema.   ?Neuro: MS is good with appropriate affect, pt is alert and Ox3  ? ? ? ?DM Foot Exam 12/07/2020 ? ?The skin of the feet is intact without sores or ulcerations. ?The pedal pulses are 2+ on right and 2+ on left. ?The sensation is intact to a screening 5.07, 10 gram monofilament bilaterally ? ? ? ? ?DATA REVIEWED: ? ?Lab Results  ?Component Value Date  ? HGBA1C 6.8 (A) 05/12/2021  ? HGBA1C 6.6 (A) 12/07/2020  ? HGBA1C 7.2 (H) 09/17/2020  ? ? Latest Reference Range & Units 09/17/20 11:02  ?Sodium 135 - 145 mEq/L 139  ?Potassium 3.5 - 5.1 mEq/L 4.9  ?Chloride 96 - 112 mEq/L 102  ?CO2 19 - 32 mEq/L 25  ?Glucose 70 - 99 mg/dL 194 (H)  ?BUN 6 - 23 mg/dL 15  ?Creatinine 0.40 - 1.50 mg/dL 0.87  ?Calcium 8.4 - 10.5 mg/dL 10.3  ?Alkaline Phosphatase 39 - 117 U/L 52  ?Albumin 3.5 - 5.2 g/dL 4.5   ?AST 0 - 37 U/L 14  ?ALT 0 - 53 U/L 16  ?Total Protein 6.0 - 8.3 g/dL 7.7  ?Total Bilirubin 0.2 - 1.2 mg/dL 0.5  ?GFR >60.00 mL/min 85.35  ? ? Latest Reference Range & Units 09/17/20 11:02  ?Total CHOL/HDL Ra

## 2021-05-13 DIAGNOSIS — N200 Calculus of kidney: Secondary | ICD-10-CM | POA: Diagnosis not present

## 2021-05-14 ENCOUNTER — Encounter: Payer: Self-pay | Admitting: Internal Medicine

## 2021-05-14 DIAGNOSIS — E039 Hypothyroidism, unspecified: Secondary | ICD-10-CM

## 2021-05-14 DIAGNOSIS — I10 Essential (primary) hypertension: Secondary | ICD-10-CM

## 2021-05-14 MED ORDER — LEVOTHYROXINE SODIUM 88 MCG PO TABS: 88.0000 ug | ORAL_TABLET | Freq: Every day | ORAL | 3 refills | Status: DC

## 2021-05-14 MED ORDER — ATORVASTATIN CALCIUM 10 MG PO TABS: 10.0000 mg | ORAL_TABLET | Freq: Every day | ORAL | 1 refills | Status: DC

## 2021-05-14 MED ORDER — LISINOPRIL 2.5 MG PO TABS: 2.5000 mg | ORAL_TABLET | Freq: Every day | ORAL | 1 refills | Status: DC

## 2021-06-01 ENCOUNTER — Telehealth (INDEPENDENT_AMBULATORY_CARE_PROVIDER_SITE_OTHER): Payer: Medicare Other | Admitting: Internal Medicine

## 2021-06-01 ENCOUNTER — Encounter: Payer: Self-pay | Admitting: Internal Medicine

## 2021-06-01 DIAGNOSIS — U071 COVID-19: Secondary | ICD-10-CM | POA: Diagnosis not present

## 2021-06-01 MED ORDER — MOLNUPIRAVIR 200 MG PO CAPS
4.0000 | ORAL_CAPSULE | Freq: Two times a day (BID) | ORAL | 0 refills | Status: AC
Start: 2021-06-01 — End: 2021-06-06

## 2021-06-01 NOTE — Progress Notes (Signed)
Virtual Visit via Video Note ? ?I connected with [name] on 06/01/21 at  3:40 PM EDT by a video enabled telemedicine application and verified that I am speaking with the correct person using two identifiers. ?  ?I discussed the limitations of evaluation and management by telemedicine and the availability of in person appointments. The patient expressed understanding and agreed to proceed. ? ?Present for the visit:  Myself, Dr Billey Gosling, Wynetta Emery.  The patient is currently at home and I am in the office.   ? ?No referring provider.  ? ? ?History of Present Illness: ?This is an acute visit for COVID. ? ?Symptoms started yesterday and he did test positive at home.  He just returned from traveling.  He states fatigue, right ear pain and popping, nasal congestion, loss of taste, shortness of breath, wheezing and headaches.  He was wondering about an antiviral. ? ? ?Review of Systems  ?Constitutional:  Positive for malaise/fatigue. Negative for fever.  ?HENT:  Positive for congestion and ear pain (right ear pain and popping). Negative for sinus pain and sore throat.   ?     Loss of taste  ?Respiratory:  Positive for shortness of breath and wheezing. Negative for cough.   ?Neurological:  Positive for headaches.   ? ? ?Social History  ? ?Socioeconomic History  ? Marital status: Married  ?  Spouse name: Izora Gala  ? Number of children: 2  ? Years of education: Not on file  ? Highest education level: Not on file  ?Occupational History  ? Occupation: BUYER  ?  Employer: [employer]  ?Tobacco Use  ? Smoking status: Former  ?  Packs/day: 0.90  ?  Years: 8.00  ?  Pack years: 7.20  ?  Types: Cigarettes  ?  Quit date: 01/16/1974  ?  Years since quitting: 47.4  ? Smokeless tobacco: Never  ? Tobacco comments:  ?  smoked  1966- 1976,up to 1 ppd  ?Vaping Use  ? Vaping Use: Never used  ?Substance and Sexual Activity  ? Alcohol use: Yes  ?  Comment: socially  ? Drug use: No  ? Sexual activity: Not on file   ?Other Topics Concern  ? Not on file  ?Social History Narrative  ? Not on file  ? ?Social Determinants of Health  ? ?Financial Resource Strain: Low Risk   ? Difficulty of Paying Living Expenses: Not hard at all  ?Food Insecurity: No Food Insecurity  ? Worried About Charity fundraiser in the Last Year: Never true  ? Ran Out of Food in the Last Year: Never true  ?Transportation Needs: No Transportation Needs  ? Lack of Transportation (Medical): No  ? Lack of Transportation (Non-Medical): No  ?Physical Activity: Sufficiently Active  ? Days of Exercise per Week: 7 days  ? Minutes of Exercise per Session: 30 min  ?Stress: No Stress Concern Present  ? Feeling of Stress : Not at all  ?Social Connections: Moderately Isolated  ? Frequency of Communication with Friends and Family: More than three times a week  ? Frequency of Social Gatherings with Friends and Family: Never  ? Attends Religious Services: Never  ? Active Member of Clubs or Organizations: No  ? Attends Archivist Meetings: Never  ? Marital Status: Married  ? ?  ?Observations/Objective: ?Appears well in NAD ?Breathing normally ? ?Assessment and Plan: ? ?See Problem List for Assessment and Plan of chronic medical problems. ? ? ?Follow Up Instructions: ? ?  ?  I discussed the assessment and treatment plan with the patient. The patient was provided an opportunity to ask questions and all were answered. The patient agreed with the plan and demonstrated an understanding of the instructions. ?  ?The patient was advised to call back or seek an in-person evaluation if the symptoms worsen or if the condition fails to improve as anticipated. ? ? ? ?Binnie Rail, MD ? ?

## 2021-06-01 NOTE — Assessment & Plan Note (Signed)
Acute ?Symptoms started yesterday ?He is high risk, symptoms mild-moderate ?I think he is a very good candidate for an antiviral-start molnupiravir 4 caps twice daily for 5 days.  Discussed possible side effects ?Discussed over-the-counter medications he can take-recommended continuing Flonase daily for the ear and can even take Coricidin cold products to see if that this helps ?Rest, fluids ?He will call with any questions or concerns ?

## 2021-06-07 ENCOUNTER — Ambulatory Visit: Payer: Medicare Other | Admitting: Internal Medicine

## 2021-06-07 ENCOUNTER — Encounter: Payer: Self-pay | Admitting: Internal Medicine

## 2021-06-08 ENCOUNTER — Ambulatory Visit: Payer: Medicare Other | Admitting: Family Medicine

## 2021-06-09 ENCOUNTER — Encounter: Payer: Self-pay | Admitting: Internal Medicine

## 2021-06-09 NOTE — Progress Notes (Signed)
Subjective:    Patient ID: [name], male    DOB: 05-Feb-1945, 76 y.o.   MRN: [id number]      HPI Erland is here for  Chief Complaint  Patient presents with   Dizziness    Dizziness    Ear Fullness    Left ear clogged, pressure, ringing - he has tinnitus, dizziness, left ear clogged and pressure, decreased hearing.    Tried mucinex, which helped with some of the congestion, but he does not feel like it helped with the ear much.  He is still very fatigued from [place].   Medications and allergies reviewed with patient and updated if appropriate.  Current Outpatient Medications on File Prior to Visit  Medication Sig Dispense Refill   amLODipine (NORVASC) 10 MG tablet Take 1 tablet (10 mg total) by mouth daily. Keep scheduled appt in Sept must see provider for future refills 90 tablet 3   atorvastatin (LIPITOR) 10 MG tablet Take 1 tablet (10 mg total) by mouth daily. 90 tablet 1   brimonidine (ALPHAGAN) 0.2 % ophthalmic solution 1 drop 2 (two) times daily.     dorzolamide (TRUSOPT) 2 % ophthalmic solution SMARTSIG:In Eye(s)     empagliflozin (JARDIANCE) 25 MG TABS tablet Take 1 tablet (25 mg total) by mouth daily before breakfast. 90 tablet 1   finasteride (PROPECIA) 1 MG tablet Take 0.25 mg by mouth daily.     glipiZIDE (GLUCOTROL) 5 MG tablet Take 1 tablet (5 mg total) by mouth 2 (two) times daily before a meal. 180 tablet 1   Latanoprostene Bunod (VYZULTA) 0.024 % SOLN Place 1 drop into both eyes daily.     levothyroxine (SYNTHROID) 88 MCG tablet Take 1 tablet (88 mcg total) by mouth daily. 90 tablet 3   lisinopril (ZESTRIL) 2.5 MG tablet Take 1 tablet (2.5 mg total) by mouth daily. 90 tablet 1   Magnesium Oxide 500 MG CAPS Take 1 capsule by mouth 2 (two) times daily.     metFORMIN (GLUCOPHAGE-XR) 500 MG 24 hr tablet TAKE 2 TABLETS TWICE A DAY 360 tablet 3   metoprolol succinate (TOPROL-XL) 100 MG 24 hr tablet TAKE 1 TABLET TWICE A DAY  AND AN EXTRA 1/2 TABLET     DAILY AS NEEDED FOR ATRIAL FIBRILLATION 225 tablet 1   rivaroxaban (XARELTO) 20 MG TABS tablet TAKE 1 TABLET DAILY WITH SUPPER 90 tablet 3   Semaglutide (RYBELSUS) 14 MG TABS Take 1 tablet (14 mg total) by mouth daily. 90 tablet 1   tamsulosin (FLOMAX) 0.4 MG CAPS capsule Take 0.4 mg by mouth daily.     timolol (TIMOPTIC) 0.5 % ophthalmic solution 1 drop every morning.     traMADol (ULTRAM) 50 MG tablet Take by mouth.     No current facility-administered medications on file prior to visit.    Review of Systems  Constitutional:  Negative for fever.  HENT:  Positive for ear pain, sore throat and tinnitus. Negative for congestion, sinus pressure and sinus pain.   Eyes:  Positive for visual disturbance (blurry distnace vision).  Neurological:  Positive for dizziness. Negative for headaches.      Objective:   Vitals:   06/10/21 0802  BP: 130/72  Pulse: 80  Temp: 97.9 F (36.6 C)  SpO2: 99%   BP Readings from Last 3 Encounters:  06/10/21 130/72  05/12/21 140/90  04/15/21 (!) 160/80   Wt Readings from Last 3 Encounters:  06/10/21 168 lb (76.2 kg)  05/12/21 169 lb 12.8  oz (77 kg)  04/15/21 172 lb (78 kg)   Body mass index is 25.54 kg/m.    Physical Exam Constitutional:      General: He is not in acute distress.    Appearance: Normal appearance. He is not ill-appearing.  HENT:     Head: Normocephalic.     Right Ear: Tympanic membrane, ear canal and external ear normal. There is no impacted cerumen.     Left Ear: Tympanic membrane, ear canal and external ear normal. There is no impacted cerumen.     Mouth/Throat:     Mouth: Mucous membranes are moist.     Pharynx: No oropharyngeal exudate or posterior oropharyngeal erythema.  Eyes:     Conjunctiva/sclera: Conjunctivae normal.  Musculoskeletal:     Cervical back: Neck supple. No tenderness.  Lymphadenopathy:     Cervical: No cervical adenopathy.  Skin:    General: Skin is warm and dry.     Findings: No rash.   Neurological:     General: No focal deficit present.     Mental Status: He is alert. Mental status is at baseline.           Assessment & Plan:    See Problem List for Assessment and Plan of chronic medical problems.

## 2021-06-10 ENCOUNTER — Ambulatory Visit (INDEPENDENT_AMBULATORY_CARE_PROVIDER_SITE_OTHER): Payer: Medicare Other | Admitting: Internal Medicine

## 2021-06-10 VITALS — BP 130/72 | HR 80 | Temp 97.9°F | Ht 68.0 in | Wt 168.0 lb

## 2021-06-10 DIAGNOSIS — E1165 Type 2 diabetes mellitus with hyperglycemia: Secondary | ICD-10-CM | POA: Diagnosis not present

## 2021-06-10 DIAGNOSIS — H6982 Other specified disorders of Eustachian tube, left ear: Secondary | ICD-10-CM

## 2021-06-10 DIAGNOSIS — H6992 Unspecified Eustachian tube disorder, left ear: Secondary | ICD-10-CM | POA: Insufficient documentation

## 2021-06-10 DIAGNOSIS — R42 Dizziness and giddiness: Secondary | ICD-10-CM | POA: Diagnosis not present

## 2021-06-10 MED ORDER — MECLIZINE HCL 25 MG PO TABS: 25.0000 mg | ORAL_TABLET | Freq: Three times a day (TID) | ORAL | 0 refills | Status: DC | PRN

## 2021-06-10 MED ORDER — PREDNISONE 50 MG PO TABS: 50.0000 mg | ORAL_TABLET | Freq: Every day | ORAL | 0 refills | Status: DC

## 2021-06-10 NOTE — Assessment & Plan Note (Signed)
Acute Likely BPPV related to COVID and eustachian tube dysfunction Treating eustachian tube dysfunction with prednisone 50 mg daily x5 days Start meclizine 25 mg 3 times daily as needed-discussed this can cause some drowsiness Can consider vestibular PT, but hopefully it will improve with the steroids and meclizine

## 2021-06-10 NOTE — Patient Instructions (Addendum)
Medications changes include :  prednisone 50 mg daily x 5 days, meclizine 25 mg three times a day as needed    Your prescription(s) have been sent to your pharmacy.    A referral was ordered for ENT.     Someone from that office will call you to schedule an appointment.    Return if symptoms worsen or fail to improve.   Eustachian Tube Dysfunction  Eustachian tube dysfunction refers to a condition in which a blockage develops in the narrow passage that connects the middle ear to the back of the nose (eustachian tube). The eustachian tube regulates air pressure in the middle ear by letting air move between the ear and nose. It also helps to drain fluid from the middle ear space. Eustachian tube dysfunction can affect one or both ears. When the eustachian tube does not function properly, air pressure, fluid, or both can build up in the middle ear. What are the causes? This condition occurs when the eustachian tube becomes blocked or cannot open normally. Common causes of this condition include: Ear infections. Colds and other infections that affect the nose, mouth, and throat (upper respiratory tract). Allergies. Irritation from cigarette smoke. Irritation from stomach acid coming up into the esophagus (gastroesophageal reflux). The esophagus is the part of the body that moves food from the mouth to the stomach. Sudden changes in air pressure, such as from descending in an airplane or scuba diving. Abnormal growths in the nose or throat, such as: Growths that line the nose (nasal polyps). Abnormal growth of cells (tumors). Enlarged tissue at the back of the throat (adenoids). What increases the risk? You are more likely to develop this condition if: You smoke. You are overweight. You are a child who has: Certain birth defects of the mouth, such as cleft palate. Large tonsils or adenoids. What are the signs or symptoms? Common symptoms of this condition include: A feeling  of fullness in the ear. Ear pain. Clicking or popping noises in the ear. Ringing in the ear (tinnitus). Hearing loss. Loss of balance. Dizziness. Symptoms may get worse when the air pressure around you changes, such as when you travel to an area of high elevation, fly on an airplane, or go scuba diving. How is this diagnosed? This condition may be diagnosed based on: Your symptoms. A physical exam of your ears, nose, and throat. Tests, such as those that measure: The movement of your eardrum. Your hearing (audiometry). How is this treated? Treatment depends on the cause and severity of your condition. In mild cases, you may relieve your symptoms by moving air into your ears. This is called "popping the ears." In more severe cases, or if you have symptoms of fluid in your ears, treatment may include: Medicines to relieve congestion (decongestants). Medicines that treat allergies (antihistamines). Nasal sprays or ear drops that contain medicines that reduce swelling (steroids). A procedure to drain the fluid in your eardrum. In this procedure, a small tube may be placed in the eardrum to: Drain the fluid. Restore the air in the middle ear space. A procedure to insert a balloon device through the nose to inflate the opening of the eustachian tube (balloon dilation). Follow these instructions at home: Lifestyle Do not do any of the following until your health care provider approves: Travel to high altitudes. Fly in airplanes. Work in a Pension scheme manager or room. Scuba dive. Do not use any products that contain nicotine or tobacco. These products include  cigarettes, chewing tobacco, and vaping devices, such as e-cigarettes. If you need help quitting, ask your health care provider. Keep your ears dry. Wear fitted earplugs during showering and bathing. Dry your ears completely after. General instructions Take over-the-counter and prescription medicines only as told by your health care  provider. Use techniques to help pop your ears as recommended by your health care provider. These may include: Chewing gum. Yawning. Frequent, forceful swallowing. Closing your mouth, holding your nose closed, and gently blowing as if you are trying to blow air out of your nose. Keep all follow-up visits. This is important. Contact a health care provider if: Your symptoms do not go away after treatment. Your symptoms come back after treatment. You are unable to pop your ears. You have: A fever. Pain in your ear. Pain in your head or neck. Fluid draining from your ear. Your hearing suddenly changes. You become very dizzy. You lose your balance. Get help right away if: You have a sudden, severe increase in any of your symptoms. Summary Eustachian tube dysfunction refers to a condition in which a blockage develops in the eustachian tube. It can be caused by ear infections, allergies, inhaled irritants, or abnormal growths in the nose or throat. Symptoms may include ear pain or fullness, hearing loss, or ringing in the ears. Mild cases are treated with techniques to unblock the ears, such as yawning or chewing gum. More severe cases are treated with medicines or procedures. This information is not intended to replace advice given to you by your health care provider. Make sure you discuss any questions you have with your health care provider. Document Revised: 03/15/2020 Document Reviewed: 03/15/2020 Elsevier Patient Education  [place].

## 2021-06-10 NOTE — Assessment & Plan Note (Signed)
Acute Symptoms consistent with eustachian tube dysfunction on left No evidence of infection Prednisone 50 mg daily with breakfast x5 days Can continue Mucinex Can try Flonase Will refer to ENT given he is having dizziness and decreased hearing

## 2021-06-10 NOTE — Assessment & Plan Note (Signed)
Chronic Sugars have been well controlled at home-low 100s Management per endocrine I am putting him on prednisone for 5 days-discussed this may elevate his sugars, but this will be transient Advised him to be compliant with a diabetic diet and call with any concerns.

## 2021-07-05 DIAGNOSIS — N200 Calculus of kidney: Secondary | ICD-10-CM | POA: Diagnosis not present

## 2021-07-11 ENCOUNTER — Telehealth: Payer: Self-pay | Admitting: Internal Medicine

## 2021-07-14 NOTE — Telephone Encounter (Signed)
1st attempt to reach pt regarding surgical clearance and the need for a tele visit.  Left a message for pt to call back and ask for the preop team. 

## 2021-07-15 ENCOUNTER — Telehealth: Payer: Self-pay | Admitting: *Deleted

## 2021-07-15 NOTE — Telephone Encounter (Signed)
I have s/w the pt and he is agreeable to plan of care for tele pre op appt 07/27/21 @ 9 am. Med rec and consent are done.Marland Kitchen

## 2021-07-15 NOTE — Telephone Encounter (Signed)
I have s/w the pt and he is agreeable to plan of care for tele pre op appt 07/27/21 @ 9 am. Med rec and consent are done..    Patient Consent for Virtual Visit        [name] has provided verbal consent on 07/15/2021 for a virtual visit (video or telephone).   CONSENT FOR VIRTUAL VISIT FOR:  [name]  By participating in this virtual visit I agree to the following:  I hereby voluntarily request, consent and authorize [facility] and its employed or contracted physicians, Engineer, materials, nurse practitioners or other licensed health care professionals (the Practitioner), to provide me with telemedicine health care services (the "Services") as deemed necessary by the treating Practitioner. I acknowledge and consent to receive the Services by the Practitioner via telemedicine. I understand that the telemedicine visit will involve communicating with the Practitioner through live audiovisual communication technology and the disclosure of certain medical information by electronic transmission. I acknowledge that I have been given the opportunity to request an in-person assessment or other available alternative prior to the telemedicine visit and am voluntarily participating in the telemedicine visit.  I understand that I have the right to withhold or withdraw my consent to the use of telemedicine in the course of my care at any time, without affecting my right to future care or treatment, and that the Practitioner or I may terminate the telemedicine visit at any time. I understand that I have the right to inspect all information obtained and/or recorded in the course of the telemedicine visit and may receive copies of available information for a reasonable fee.  I understand that some of the potential risks of receiving the Services via telemedicine include:  Delay or interruption in medical evaluation due to technological equipment failure or disruption; Information  transmitted may not be sufficient (e.g. poor resolution of images) to allow for appropriate medical decision making by the Practitioner; and/or  In rare instances, security protocols could fail, causing a breach of personal health information.  Furthermore, I acknowledge that it is my responsibility to provide information about my medical history, conditions and care that is complete and accurate to the best of my ability. I acknowledge that Practitioner's advice, recommendations, and/or decision may be based on factors not within their control, such as incomplete or inaccurate data provided by me or distortions of diagnostic images or specimens that may result from electronic transmissions. I understand that the practice of medicine is not an exact science and that Practitioner makes no warranties or guarantees regarding treatment outcomes. I acknowledge that a copy of this consent can be made available to me via my patient portal ([facility]), or I can request a printed copy by calling the office of [facility].    I understand that my insurance will be billed for this visit.   I have read or had this consent read to me. I understand the contents of this consent, which adequately explains the benefits and risks of the Services being provided via telemedicine.  I have been provided ample opportunity to ask questions regarding this consent and the Services and have had my questions answered to my satisfaction. I give my informed consent for the services to be provided through the use of telemedicine in my medical care

## 2021-07-25 DIAGNOSIS — H9042 Sensorineural hearing loss, unilateral, left ear, with unrestricted hearing on the contralateral side: Secondary | ICD-10-CM | POA: Diagnosis not present

## 2021-07-25 DIAGNOSIS — H8302 Labyrinthitis, left ear: Secondary | ICD-10-CM | POA: Diagnosis not present

## 2021-07-25 DIAGNOSIS — H838X2 Other specified diseases of left inner ear: Secondary | ICD-10-CM | POA: Diagnosis not present

## 2021-07-25 DIAGNOSIS — R42 Dizziness and giddiness: Secondary | ICD-10-CM | POA: Diagnosis not present

## 2021-07-27 ENCOUNTER — Encounter: Payer: Self-pay | Admitting: Nurse Practitioner

## 2021-07-27 ENCOUNTER — Ambulatory Visit (INDEPENDENT_AMBULATORY_CARE_PROVIDER_SITE_OTHER): Payer: Medicare Other | Admitting: Nurse Practitioner

## 2021-07-27 DIAGNOSIS — Z01810 Encounter for preprocedural cardiovascular examination: Secondary | ICD-10-CM | POA: Diagnosis not present

## 2021-07-27 NOTE — Progress Notes (Signed)
Virtual Visit via Telephone Note   Because of [name]'s co-morbid illnesses, he is at least at moderate risk for complications without adequate follow up.  This format is felt to be most appropriate for this patient at this time.  The patient did not have access to video technology/had technical difficulties with video requiring transitioning to audio format only (telephone).  All issues noted in this document were discussed and addressed.  No physical exam could be performed with this format.  Please refer to the patient's chart for his consent to telehealth for [name].  Evaluation Performed:  Preoperative cardiovascular risk assessment _____________   Date:  07/27/2021   Patient ID:  [name], DOB [birth year], MRN [id number] Patient Location:  Home Provider location:   Office  Primary Care Provider:  Binnie Rail, MD Primary Cardiologist:  Thompson Grayer, MD  Chief Complaint / Patient Profile   76 y.o. y/o male with a h/o persistent atrial fibrillation s/p ablation 2015 on chronic anticoagulation, hypertension, hyperlipidemia, RBBB who is pending left ureteral balloon dilation left ureteroscopy laser lithotripsy stone extraction and left ureteral stent placement and presents today for telephonic preoperative cardiovascular risk assessment.  Past Medical History    Past Medical History:  Diagnosis Date   AF (atrial fibrillation) (HCC)    persistent afib, s/p afib ablation x 2 with subsequent convergent procedure at [facility]   Allergic rhinitis    seasonal   Atrial flutter ([place])    CTS (carpal tunnel syndrome)    PMH of   Diabetes mellitus    type 2   External hemorrhoids    Glaucoma    Hyperlipidemia    Hypertension    Hypothyroidism    Renal calculus 2005   [facility]   Past Surgical History:  Procedure Laterality Date   ABLATION  06/2013   atypical atrial flutter ablation by Dr Lehman Prom at [facility] (mitral annular flutter ablated)   afib  09/22/2008--11/03/2009     afib ablation x 2 by Dr Rayann Heman with subsequent convergent procedure at [facility]  2005   non obstructive CAD   CARDIOVERSION     CARDIOVERSION  12/09/2010   Procedure: CARDIOVERSION;  Surgeon: Loralie Champagne, MD;  Location: [facility];  Service: Cardiovascular;  Laterality: N/A;   CARDIOVERSION  09/15/2011   Procedure: CARDIOVERSION;  Surgeon: Josue Hector, MD;  Location: [facility] ENDOSCOPY;  Service: Cardiovascular;  Laterality: N/A;  office drawing labs wed 8/28   CARDIOVERSION N/A 07/18/2012   Procedure: CARDIOVERSION;  Surgeon: Thayer Headings, MD;  Location: [facility] ENDOSCOPY;  Service: Cardiovascular;  Laterality: N/A;   CARDIOVERSION  08/2014   [place]   COLONOSCOPY  2009   Dr Sharlett Iles   convergent procedure  2013   at [facility] for a Myton Right 2008, 2012   LOOP RECORDER IMPLANT Left 12-2010   TONSILLECTOMY     TOTAL HIP ARTHROPLASTY  06/2010   Depuy prosthesis replacement, Dr Alvan Dame   TOTAL HIP ARTHROPLASTY Right 2008    Depuy;Dr Alvan Dame    Allergies  Allergies  Allergen Reactions   Wilder Glade [Dapagliflozin] Other (See Comments)    Dry mouth, increased urination, increased appetite    History of Present Illness    [name] is a 76 y.o. male who presents via audio/video conferencing for a telehealth visit today.  Pt was last seen in cardiology clinic on 04/15/21 by Dr. Caryl Comes.  At that time [name] was  doing well.  The patient is now pending procedure as outlined above. Since his last visit, he denies chest pain, shortness of breath, lower extremity edema, fatigue, palpitations, melena, hematuria, hemoptysis, diaphoresis, weakness, presyncope, syncope, orthopnea, and PND.  Home Medications    Prior to Admission medications   Medication Sig Start Date End Date Taking? Authorizing Provider  amLODipine (NORVASC) 10 MG tablet Take 1 tablet (10 mg total) by mouth daily. Keep scheduled appt in Sept must see provider  for future refills 09/17/20   Binnie Rail, MD  atorvastatin (LIPITOR) 10 MG tablet Take 1 tablet (10 mg total) by mouth daily. 05/14/21   Binnie Rail, MD  brimonidine (ALPHAGAN) 0.2 % ophthalmic solution 1 drop 2 (two) times daily. 04/24/19   [provider]  dorzolamide (TRUSOPT) 2 % ophthalmic solution SMARTSIG:In Eye(s) 08/05/20   [provider]  empagliflozin (JARDIANCE) 25 MG TABS tablet Take 1 tablet (25 mg total) by mouth daily before breakfast. 03/28/21   Shamleffer, Melanie Crazier, MD  finasteride (PROPECIA) 1 MG tablet Take 0.25 mg by mouth daily.    [provider]  glipiZIDE (GLUCOTROL) 5 MG tablet Take 1 tablet (5 mg total) by mouth 2 (two) times daily before a meal. 03/28/21   Shamleffer, Melanie Crazier, MD  Latanoprostene Bunod (VYZULTA) 0.024 % SOLN Place 1 drop into both eyes daily.    [provider]  levothyroxine (SYNTHROID) 88 MCG tablet Take 1 tablet (88 mcg total) by mouth daily. 05/14/21   Binnie Rail, MD  lisinopril (ZESTRIL) 2.5 MG tablet Take 1 tablet (2.5 mg total) by mouth daily. 05/14/21   Binnie Rail, MD  Magnesium Oxide 500 MG CAPS Take 1 capsule by mouth 2 (two) times daily.    [provider]  meclizine (ANTIVERT) 25 MG tablet Take 1 tablet (25 mg total) by mouth 3 (three) times daily as needed for dizziness. 06/10/21   Binnie Rail, MD  metFORMIN (GLUCOPHAGE-XR) 500 MG 24 hr tablet TAKE 2 TABLETS TWICE A DAY 03/29/21   Burns, Claudina Lick, MD  metoprolol succinate (TOPROL-XL) 100 MG 24 hr tablet TAKE 1 TABLET TWICE A DAY  AND AN EXTRA 1/2 TABLET    DAILY AS NEEDED FOR ATRIAL FIBRILLATION 03/28/21   Binnie Rail, MD  predniSONE (DELTASONE) 50 MG tablet Take 1 tablet (50 mg total) by mouth daily with breakfast. 06/10/21   Burns, Claudina Lick, MD  rivaroxaban (XARELTO) 20 MG TABS tablet TAKE 1 TABLET DAILY WITH SUPPER 03/17/21   Burns, Claudina Lick, MD  Semaglutide (RYBELSUS) 14 MG TABS Take 1 tablet (14 mg total) by mouth daily.  04/25/21   Shamleffer, Melanie Crazier, MD  tamsulosin (FLOMAX) 0.4 MG CAPS capsule Take 0.4 mg by mouth daily. 05/06/21   [provider]  timolol (TIMOPTIC) 0.5 % ophthalmic solution 1 drop every morning. 08/12/20   [provider]  traMADol Veatrice Bourbon) 50 MG tablet Take by mouth. 05/09/21   [provider]    Physical Exam    Vital Signs:  Arlyn Leak does not have vital signs available for review today.  Given telephonic nature of communication, physical exam is limited. AAOx3. NAD. Normal affect.  Speech and respirations are unlabored.  Accessory Clinical Findings    None  Assessment & Plan    1.  Preoperative Cardiovascular Risk Assessment: The patient is doing well from a cardiac perspective. Therefore, based on ACC/AHA guidelines, the patient would be at acceptable risk for the planned procedure without  further cardiovascular testing. The patient was advised that if he develops new symptoms prior to surgery to contact our office to arrange for a follow-up visit, and he verbalized understanding. According to the Revised Cardiac Risk Index (RCRI), his Perioperative Risk of Major Cardiac Event is (%): 0.4. His Functional Capacity in METs is: 7.59 according to the [facility] Activity Status Index (DASI). Per office protocol, patient can hold Xarelto for 3 days prior to procedure as requested.      A copy of this note will be routed to requesting surgeon.  Time:   Today, I have spent 10 minutes with the patient with telehealth technology discussing medical history, symptoms, and management plan.     Emmaline Life, NP-C    07/27/2021, 9:00 AM [facility] [address], [address] Office [phone] Fax [phone]

## 2021-08-05 ENCOUNTER — Other Ambulatory Visit: Payer: Self-pay | Admitting: Internal Medicine

## 2021-08-05 DIAGNOSIS — I10 Essential (primary) hypertension: Secondary | ICD-10-CM

## 2021-08-11 DIAGNOSIS — H401111 Primary open-angle glaucoma, right eye, mild stage: Secondary | ICD-10-CM | POA: Diagnosis not present

## 2021-08-11 DIAGNOSIS — H4052X1 Glaucoma secondary to other eye disorders, left eye, mild stage: Secondary | ICD-10-CM | POA: Diagnosis not present

## 2021-08-16 ENCOUNTER — Other Ambulatory Visit: Payer: Self-pay

## 2021-08-16 ENCOUNTER — Encounter: Payer: Self-pay | Admitting: Internal Medicine

## 2021-08-16 DIAGNOSIS — N200 Calculus of kidney: Secondary | ICD-10-CM | POA: Diagnosis not present

## 2021-08-16 DIAGNOSIS — R739 Hyperglycemia, unspecified: Secondary | ICD-10-CM

## 2021-08-16 MED ORDER — GLIPIZIDE 5 MG PO TABS: 5.0000 mg | ORAL_TABLET | Freq: Two times a day (BID) | ORAL | 1 refills | Status: DC

## 2021-08-16 MED ORDER — EMPAGLIFLOZIN 25 MG PO TABS: 25.0000 mg | ORAL_TABLET | Freq: Every day | ORAL | 1 refills | Status: DC

## 2021-08-17 DIAGNOSIS — N200 Calculus of kidney: Secondary | ICD-10-CM | POA: Diagnosis not present

## 2021-08-24 ENCOUNTER — Other Ambulatory Visit: Payer: Self-pay | Admitting: Urology

## 2021-08-24 DIAGNOSIS — N200 Calculus of kidney: Secondary | ICD-10-CM | POA: Diagnosis not present

## 2021-08-24 DIAGNOSIS — N135 Crossing vessel and stricture of ureter without hydronephrosis: Secondary | ICD-10-CM | POA: Diagnosis not present

## 2021-09-18 ENCOUNTER — Encounter: Payer: Self-pay | Admitting: Internal Medicine

## 2021-09-20 ENCOUNTER — Other Ambulatory Visit: Payer: Self-pay

## 2021-09-20 DIAGNOSIS — R739 Hyperglycemia, unspecified: Secondary | ICD-10-CM

## 2021-09-20 MED ORDER — EMPAGLIFLOZIN 25 MG PO TABS: 25.0000 mg | ORAL_TABLET | Freq: Every day | ORAL | 1 refills | Status: DC

## 2021-09-20 MED ORDER — GLIPIZIDE 5 MG PO TABS: 5.0000 mg | ORAL_TABLET | Freq: Two times a day (BID) | ORAL | 1 refills | Status: DC

## 2021-09-22 ENCOUNTER — Other Ambulatory Visit: Payer: Self-pay

## 2021-09-22 ENCOUNTER — Encounter: Payer: Self-pay | Admitting: Internal Medicine

## 2021-09-22 DIAGNOSIS — Z23 Encounter for immunization: Secondary | ICD-10-CM | POA: Diagnosis not present

## 2021-09-22 MED ORDER — RYBELSUS 14 MG PO TABS: 14.0000 mg | ORAL_TABLET | Freq: Every day | ORAL | 0 refills | Status: DC

## 2021-09-23 ENCOUNTER — Other Ambulatory Visit: Payer: Self-pay | Admitting: Internal Medicine

## 2021-09-23 DIAGNOSIS — D485 Neoplasm of uncertain behavior of skin: Secondary | ICD-10-CM | POA: Diagnosis not present

## 2021-09-23 DIAGNOSIS — D225 Melanocytic nevi of trunk: Secondary | ICD-10-CM | POA: Diagnosis not present

## 2021-09-23 DIAGNOSIS — Z1283 Encounter for screening for malignant neoplasm of skin: Secondary | ICD-10-CM | POA: Diagnosis not present

## 2021-09-27 ENCOUNTER — Encounter: Payer: Self-pay | Admitting: Internal Medicine

## 2021-09-27 NOTE — Patient Instructions (Addendum)
     Blood work was ordered.     Medications changes include :   trazodone 50 mg nightly for sleep   Your prescription(s) have been sent to your pharmacy.     Return in about 6 months (around 03/29/2022) for follow up.

## 2021-09-27 NOTE — Progress Notes (Unsigned)
Subjective:    Patient ID: [name], male    DOB: December 25, 1945, 76 y.o.   MRN: [id number]     HPI Medard is here for follow up of his chronic medical problems, including dm, htn, hld, hypothyroid  Having a problem sleeping. This started a while ago. Every night he wakes up after 2 hrs and then watches tv for 4 hours and then can get back to sleep.  He feels his body is changing a little.  His mind feels restless.  He denies stress, depression.  No change in meds that may influence his sleep.  1-2 cups of coffee in am only.   Not exercising.  Melatonin does not work.    Medications and allergies reviewed with patient and updated if appropriate.  Current Outpatient Medications on File Prior to Visit  Medication Sig Dispense Refill   amLODipine (NORVASC) 10 MG tablet Take 1 tablet (10 mg total) by mouth daily. Keep scheduled appt in Sept must see provider for future refills 90 tablet 3   atorvastatin (LIPITOR) 10 MG tablet Take 1 tablet (10 mg total) by mouth daily. 90 tablet 1   augmented betamethasone dipropionate (DIPROLENE-AF) 0.05 % cream Apply topically.     brimonidine (ALPHAGAN) 0.2 % ophthalmic solution 1 drop 2 (two) times daily.     dorzolamide (TRUSOPT) 2 % ophthalmic solution SMARTSIG:In Eye(s)     empagliflozin (JARDIANCE) 25 MG TABS tablet Take 1 tablet (25 mg total) by mouth daily before breakfast. 90 tablet 1   finasteride (PROPECIA) 1 MG tablet Take 0.25 mg by mouth daily.     glipiZIDE (GLUCOTROL) 5 MG tablet Take 1 tablet (5 mg total) by mouth 2 (two) times daily before a meal. 180 tablet 1   Latanoprostene Bunod (VYZULTA) 0.024 % SOLN Place 1 drop into both eyes daily.     levothyroxine (SYNTHROID) 88 MCG tablet Take 1 tablet (88 mcg total) by mouth daily. 90 tablet 3   lisinopril (ZESTRIL) 2.5 MG tablet TAKE 1 TABLET DAILY 90 tablet 1   Magnesium Oxide 500 MG CAPS Take 1 capsule by mouth 2 (two) times daily.     meclizine (ANTIVERT) 25 MG tablet Take  1 tablet (25 mg total) by mouth 3 (three) times daily as needed for dizziness. 30 tablet 0   metFORMIN (GLUCOPHAGE-XR) 500 MG 24 hr tablet TAKE 2 TABLETS TWICE A DAY 360 tablet 3   metoprolol succinate (TOPROL-XL) 100 MG 24 hr tablet TAKE 1 TABLET TWICE A DAY  AND AN EXTRA 1/2 TABLET    DAILY AS NEEDED FOR ATRIAL FIBRILLATION 225 tablet 1   rivaroxaban (XARELTO) 20 MG TABS tablet TAKE 1 TABLET DAILY WITH SUPPER 90 tablet 3   Semaglutide (RYBELSUS) 14 MG TABS Take 1 tablet (14 mg total) by mouth daily. 90 tablet 0   tamsulosin (FLOMAX) 0.4 MG CAPS capsule Take 0.4 mg by mouth daily.     timolol (TIMOPTIC) 0.5 % ophthalmic solution 1 drop every morning.     No current facility-administered medications on file prior to visit.     Review of Systems  Constitutional:  Negative for fever.  Respiratory:  Negative for cough, shortness of breath and wheezing.   Cardiovascular:  Negative for chest pain, palpitations and leg swelling.  Neurological:  Negative for light-headedness and headaches.       Objective:   Vitals:   09/28/21 1015  BP: 124/76  Pulse: 78  Temp: 97.6 F (36.4 C)  SpO2: 98%  BP Readings from Last 3 Encounters:  09/28/21 124/76  06/10/21 130/72  05/12/21 140/90   Wt Readings from Last 3 Encounters:  09/28/21 168 lb (76.2 kg)  06/10/21 168 lb (76.2 kg)  05/12/21 169 lb 12.8 oz (77 kg)   Body mass index is 25.54 kg/m.    Physical Exam Constitutional:      General: He is not in acute distress.    Appearance: Normal appearance. He is not ill-appearing.  HENT:     Head: Normocephalic and atraumatic.  Eyes:     Conjunctiva/sclera: Conjunctivae normal.  Cardiovascular:     Rate and Rhythm: Normal rate and regular rhythm.     Heart sounds: Normal heart sounds. No murmur heard. Pulmonary:     Effort: Pulmonary effort is normal. No respiratory distress.     Breath sounds: Normal breath sounds. No wheezing or rales.  Musculoskeletal:     Right lower leg: No  edema.     Left lower leg: No edema.  Skin:    General: Skin is warm and dry.     Findings: No rash.  Neurological:     Mental Status: He is alert. Mental status is at baseline.  Psychiatric:        Mood and Affect: Mood normal.        Lab Results  Component Value Date   WBC 6.7 03/17/2021   HGB 16.7 03/17/2021   HCT 50.4 03/17/2021   PLT 209.0 03/17/2021   GLUCOSE 112 (H) 03/17/2021   CHOL 118 03/17/2021   TRIG 101.0 03/17/2021   HDL 51.00 03/17/2021   LDLDIRECT 91.0 08/23/2016   LDLCALC 47 03/17/2021   ALT 13 03/17/2021   AST 17 03/17/2021   NA 137 03/17/2021   K 4.3 03/17/2021   CL 100 03/17/2021   CREATININE 0.92 03/17/2021   BUN 24 (H) 03/17/2021   CO2 26 03/17/2021   TSH 1.37 03/17/2021   PSA 0.76 04/25/2007   INR 2.6 09/26/2010   HGBA1C 6.8 (A) 05/12/2021   MICROALBUR 13.0 (H) 09/17/2020     Assessment & Plan:    See Problem List for Assessment and Plan of chronic medical problems.

## 2021-09-28 ENCOUNTER — Ambulatory Visit (INDEPENDENT_AMBULATORY_CARE_PROVIDER_SITE_OTHER): Payer: Medicare Other | Admitting: Internal Medicine

## 2021-09-28 VITALS — BP 124/76 | HR 78 | Temp 97.6°F | Ht 68.0 in | Wt 168.0 lb

## 2021-09-28 DIAGNOSIS — E7849 Other hyperlipidemia: Secondary | ICD-10-CM | POA: Diagnosis not present

## 2021-09-28 DIAGNOSIS — I10 Essential (primary) hypertension: Secondary | ICD-10-CM | POA: Diagnosis not present

## 2021-09-28 DIAGNOSIS — I4819 Other persistent atrial fibrillation: Secondary | ICD-10-CM | POA: Diagnosis not present

## 2021-09-28 DIAGNOSIS — E1165 Type 2 diabetes mellitus with hyperglycemia: Secondary | ICD-10-CM

## 2021-09-28 DIAGNOSIS — E039 Hypothyroidism, unspecified: Secondary | ICD-10-CM

## 2021-09-28 DIAGNOSIS — G479 Sleep disorder, unspecified: Secondary | ICD-10-CM | POA: Diagnosis not present

## 2021-09-28 LAB — CBC WITH DIFFERENTIAL/PLATELET
Basophils Absolute: 0.1 10*3/uL (ref 0.0–0.1)
Basophils Relative: 1.2 % (ref 0.0–3.0)
Eosinophils Absolute: 0.6 10*3/uL (ref 0.0–0.7)
Eosinophils Relative: 8.1 % — ABNORMAL HIGH (ref 0.0–5.0)
HCT: 46.7 % (ref 39.0–52.0)
Hemoglobin: 15.5 g/dL (ref 13.0–17.0)
Lymphocytes Relative: 20.8 % (ref 12.0–46.0)
Lymphs Abs: 1.6 10*3/uL (ref 0.7–4.0)
MCHC: 33.2 g/dL (ref 30.0–36.0)
MCV: 88.6 fl (ref 78.0–100.0)
Monocytes Absolute: 0.8 10*3/uL (ref 0.1–1.0)
Monocytes Relative: 10.7 % (ref 3.0–12.0)
Neutro Abs: 4.5 10*3/uL (ref 1.4–7.7)
Neutrophils Relative %: 59.2 % (ref 43.0–77.0)
Platelets: 174 10*3/uL (ref 150.0–400.0)
RBC: 5.27 Mil/uL (ref 4.22–5.81)
RDW: 14.6 % (ref 11.5–15.5)
WBC: 7.6 10*3/uL (ref 4.0–10.5)

## 2021-09-28 LAB — COMPREHENSIVE METABOLIC PANEL
ALT: 12 U/L (ref 0–53)
AST: 14 U/L (ref 0–37)
Albumin: 4.1 g/dL (ref 3.5–5.2)
Alkaline Phosphatase: 51 U/L (ref 39–117)
BUN: 16 mg/dL (ref 6–23)
CO2: 26 mEq/L (ref 19–32)
Calcium: 9.9 mg/dL (ref 8.4–10.5)
Chloride: 104 mEq/L (ref 96–112)
Creatinine, Ser: 0.93 mg/dL (ref 0.40–1.50)
GFR: 80.64 mL/min (ref >60.00)
Glucose, Bld: 173 mg/dL — ABNORMAL HIGH (ref 70–99)
Potassium: 4.5 mEq/L (ref 3.5–5.1)
Sodium: 139 mEq/L (ref 135–145)
Total Bilirubin: 0.5 mg/dL (ref 0.2–1.2)
Total Protein: 7.3 g/dL (ref 6.0–8.3)

## 2021-09-28 LAB — MICROALBUMIN / CREATININE URINE RATIO
Creatinine,U: 48.3 mg/dL
Microalb Creat Ratio: 8.3 mg/g (ref 0.0–30.0)
Microalb, Ur: 4 mg/dL — ABNORMAL HIGH (ref 0.0–1.9)

## 2021-09-28 LAB — TSH: TSH: 1.38 u[IU]/mL (ref 0.35–5.50)

## 2021-09-28 LAB — LIPID PANEL
Cholesterol: 110 mg/dL (ref 0–200)
HDL: 45 mg/dL (ref >39.00)
LDL Cholesterol: 40 mg/dL (ref 0–99)
NonHDL: 64.79
Total CHOL/HDL Ratio: 2
Triglycerides: 123 mg/dL (ref 0.0–149.0)
VLDL: 24.6 mg/dL (ref 0.0–40.0)

## 2021-09-28 MED ORDER — METOPROLOL SUCCINATE ER 100 MG PO TB24: ORAL_TABLET | ORAL | 1 refills | Status: DC

## 2021-09-28 MED ORDER — AMLODIPINE BESYLATE 10 MG PO TABS: 10.0000 mg | ORAL_TABLET | Freq: Every day | ORAL | 3 refills | Status: DC

## 2021-09-28 MED ORDER — TRAZODONE HCL 50 MG PO TABS: 50.0000 mg | ORAL_TABLET | Freq: Every day | ORAL | 5 refills | Status: DC

## 2021-09-28 MED ORDER — LISINOPRIL 2.5 MG PO TABS: 2.5000 mg | ORAL_TABLET | Freq: Every day | ORAL | 3 refills | Status: DC

## 2021-09-28 MED ORDER — ATORVASTATIN CALCIUM 10 MG PO TABS: 10.0000 mg | ORAL_TABLET | Freq: Every day | ORAL | 3 refills | Status: DC

## 2021-09-28 MED ORDER — ATORVASTATIN CALCIUM 10 MG PO TABS: 10.0000 mg | ORAL_TABLET | Freq: Every day | ORAL | 1 refills | Status: DC

## 2021-09-28 MED ORDER — LISINOPRIL 2.5 MG PO TABS: 2.5000 mg | ORAL_TABLET | Freq: Every day | ORAL | 1 refills | Status: DC

## 2021-09-28 MED ORDER — METOPROLOL SUCCINATE ER 100 MG PO TB24: 250.0000 mg | ORAL_TABLET | Freq: Every day | ORAL | 3 refills | Status: DC

## 2021-09-28 NOTE — Assessment & Plan Note (Signed)
Chronic Check lipid panel  Continue atorvastatin 10 mg daily Regular exercise and healthy diet encouraged  

## 2021-09-28 NOTE — Assessment & Plan Note (Signed)
Chronic  Clinically euthyroid Check tsh and will titrate med dose if needed Currently taking levothyroxine 88 mcg daily 

## 2021-09-28 NOTE — Assessment & Plan Note (Signed)
Chronic Following with cardio On xarelto 20 mg daily, metoprolol xl 100 mg daily Cbc, cmp, tsh

## 2021-09-28 NOTE — Assessment & Plan Note (Signed)
Chronic  Lab Results  Component Value Date   HGBA1C 6.8 (A) 05/12/2021   Sugars well controlled Check urine microalbumin today Management per endo Stressed regular exercise, diabetic diet

## 2021-09-28 NOTE — Assessment & Plan Note (Signed)
Chronic BP well controlled Continue amlodipine 10 mg daily, lisinopril 2.5 mg daily, metoprolol xl 100 mg daily cmp

## 2021-09-28 NOTE — Assessment & Plan Note (Signed)
Has been going on for a while Typically able to fall asleep, but after couple hours he will wake up and feels restless in his mind and has to get up-will watch TV and doses intermittently on the couch, but does not feel like he can go back to bed because he feels restless Denies anxiety, depression No change in medications or caffeine intake Sometimes taking a nap during the day when he is tired He knows he is in a bad cycle and feels like he needs help to get out of it Start trazodone 50 mg nightly-can increase if needed Discussed good sleep hygiene He will update me on how effective the medication is

## 2021-10-02 ENCOUNTER — Encounter: Payer: Self-pay | Admitting: Internal Medicine

## 2021-10-02 DIAGNOSIS — G479 Sleep disorder, unspecified: Secondary | ICD-10-CM

## 2021-10-03 DIAGNOSIS — N200 Calculus of kidney: Secondary | ICD-10-CM | POA: Diagnosis not present

## 2021-10-04 ENCOUNTER — Telehealth: Payer: Self-pay | Admitting: Internal Medicine

## 2021-10-04 ENCOUNTER — Other Ambulatory Visit: Payer: Self-pay

## 2021-10-04 MED ORDER — METOPROLOL SUCCINATE ER 100 MG PO TB24: 250.0000 mg | ORAL_TABLET | Freq: Every day | ORAL | 3 refills | Status: DC

## 2021-10-04 NOTE — Telephone Encounter (Signed)
Called and spoke with Verdis Frederickson. Script updated and updated in Epic for the change. We added an extra 25 (from 225) to 250.

## 2021-10-04 NOTE — Telephone Encounter (Signed)
Pharmacy does not understand the directions on the Topol XL - Please call pharmacy at   [phone] or [phone].  They are placing the rx on hold until they hear from you.

## 2021-10-14 ENCOUNTER — Encounter: Payer: Self-pay | Admitting: Internal Medicine

## 2021-10-17 ENCOUNTER — Other Ambulatory Visit: Payer: Self-pay

## 2021-10-17 MED ORDER — RYBELSUS 14 MG PO TABS: 14.0000 mg | ORAL_TABLET | Freq: Every day | ORAL | 1 refills | Status: DC

## 2021-10-20 ENCOUNTER — Other Ambulatory Visit: Payer: Self-pay | Admitting: Internal Medicine

## 2021-10-24 MED ORDER — RAMELTEON 8 MG PO TABS: 8.0000 mg | ORAL_TABLET | Freq: Every day | ORAL | 1 refills | Status: DC

## 2021-10-24 NOTE — Addendum Note (Signed)
Addended by: Binnie Rail on: 10/24/2021 04:47 PM   Modules accepted: Orders

## 2021-10-31 NOTE — Progress Notes (Unsigned)
Subjective:    Patient ID: [name], male    DOB: 1945/04/03, 76 y.o.   MRN: [id number]      HPI [name] is here for No chief complaint on file.    Right ear issues -     Medications and allergies reviewed with patient and updated if appropriate.  Current Outpatient Medications on File Prior to Visit  Medication Sig Dispense Refill   amLODipine (NORVASC) 10 MG tablet Take 1 tablet (10 mg total) by mouth daily. Keep scheduled appt in Sept must see provider for future refills 90 tablet 3   atorvastatin (LIPITOR) 10 MG tablet Take 1 tablet (10 mg total) by mouth daily. 90 tablet 3   augmented betamethasone dipropionate (DIPROLENE-AF) 0.05 % cream Apply topically.     brimonidine (ALPHAGAN) 0.2 % ophthalmic solution 1 drop 2 (two) times daily.     dorzolamide (TRUSOPT) 2 % ophthalmic solution SMARTSIG:In Eye(s)     empagliflozin (JARDIANCE) 25 MG TABS tablet Take 1 tablet (25 mg total) by mouth daily before breakfast. 90 tablet 1   finasteride (PROPECIA) 1 MG tablet Take 0.25 mg by mouth daily.     glipiZIDE (GLUCOTROL) 5 MG tablet Take 1 tablet (5 mg total) by mouth 2 (two) times daily before a meal. 180 tablet 1   Latanoprostene Bunod (VYZULTA) 0.024 % SOLN Place 1 drop into both eyes daily.     levothyroxine (SYNTHROID) 88 MCG tablet Take 1 tablet (88 mcg total) by mouth daily. 90 tablet 3   lisinopril (ZESTRIL) 2.5 MG tablet Take 1 tablet (2.5 mg total) by mouth daily. 90 tablet 3   Magnesium Oxide 500 MG CAPS Take 1 capsule by mouth 2 (two) times daily.     meclizine (ANTIVERT) 25 MG tablet Take 1 tablet (25 mg total) by mouth 3 (three) times daily as needed for dizziness. 30 tablet 0   metFORMIN (GLUCOPHAGE-XR) 500 MG 24 hr tablet TAKE 2 TABLETS TWICE A DAY 360 tablet 3   metoprolol succinate (TOPROL-XL) 100 MG 24 hr tablet Take 2.5 tablets (250 mg total) by mouth daily. Take an extra 100 mg daily prn for palpitations.   Take with or immediately following a meal.  250 tablet 3   ramelteon (ROZEREM) 8 MG tablet Take 1 tablet (8 mg total) by mouth at bedtime. 90 tablet 1   rivaroxaban (XARELTO) 20 MG TABS tablet TAKE 1 TABLET DAILY WITH SUPPER 90 tablet 3   Semaglutide (RYBELSUS) 14 MG TABS Take 1 tablet (14 mg total) by mouth daily. 30 tablet 1   tamsulosin (FLOMAX) 0.4 MG CAPS capsule Take 0.4 mg by mouth daily.     timolol (TIMOPTIC) 0.5 % ophthalmic solution 1 drop every morning.     traZODone (DESYREL) 50 MG tablet Take 1-2 tablets (50-100 mg total) by mouth at bedtime as needed for sleep. 180 tablet 2   No current facility-administered medications on file prior to visit.    Review of Systems     Objective:  There were no vitals filed for this visit. BP Readings from Last 3 Encounters:  09/28/21 124/76  06/10/21 130/72  05/12/21 140/90   Wt Readings from Last 3 Encounters:  09/28/21 168 lb (76.2 kg)  06/10/21 168 lb (76.2 kg)  05/12/21 169 lb 12.8 oz (77 kg)   There is no height or weight on file to calculate BMI.    Physical Exam         Assessment & Plan:    See  Problem List for Assessment and Plan of chronic medical problems.

## 2021-10-31 NOTE — Progress Notes (Unsigned)
Name: [name]  Age/ Sex: 76 y.o., male   MRN/ DOB: [id number], [birth year]     PCP: Binnie Rail, MD   Reason for Endocrinology Evaluation: Type 2 Diabetes Mellitus  Initial Endocrine Consultative Visit: 10/28/2019    PATIENT IDENTIFIER: [name] is a 76 y.o. male with a past medical history of T2Dm, A.Fib  and Dyslipidemia.  The patient has followed with Endocrinology clinic since 10/28/2019 for consultative assistance with management of his diabetes.  DIABETIC HISTORY:  [name] was diagnosed with DM many years ago.Farxiga- dry mouth and frequency. His hemoglobin A1c has ranged from 6.5% in 2015, peaking at 8.5% in 2018.    On his initial visit to our clinic his A1c was 7.6% He had concerns about the cost of Jardiance, [place].    He was on Glimepiride and Metformin and we switched Glimepiride to Glipizide   Rybelsus started 06/2020  SUBJECTIVE:   During the last visit (05/12/2021): A1c 6.8%      Today (10/31/2021): [name] is here for a follow up on diabetes management.  He checks his blood sugars 1 times daily.  The patient has not had hypoglycemic episodes since the last clinic visit.    He had a follow-up with her care for A-fib on 04/15/2021  For the last 2 weeks had an episode of nausea, vomiting or diarrhea right before renal laparoscopy for renal stones which was unsuccessful  Currently with renal pain and hematuria  Nausea, vomiting or diarrhea have been improving     HOME DIABETES REGIMEN:  Glipizide 5 mg, 1 tablet BID Metformin 500 mg , 2 tablets twice a day  Jardiance 25 mg daily  Rybelsus 14 mg daily     Statin: Yes ACE-I/ARB: no    METER DOWNLOAD SUMMARY:  [id number]  DIABETIC COMPLICATIONS: Microvascular complications:   Denies: CKD, retinopathy, neuropathy Last Eye Exam: Completed 11/2020  Macrovascular complications:   Denies: CAD, CVA, PVD   HISTORY:  Past Medical History:  Past  Medical History:  Diagnosis Date   AF (atrial fibrillation) ([place])    persistent afib, s/p afib ablation x 2 with subsequent convergent procedure at [facility]   Allergic rhinitis    seasonal   Atrial flutter ([place])    CTS (carpal tunnel syndrome)    PMH of   Diabetes mellitus    type 2   External hemorrhoids    Glaucoma    Hyperlipidemia    Hypertension    Hypothyroidism    Renal calculus 2005   [facility]   Past Surgical History:  Past Surgical History:  Procedure Laterality Date   ABLATION  06/2013   atypical atrial flutter ablation by Dr Lehman Prom at [facility] (mitral annular flutter ablated)   afib 09/22/2008--11/03/2009     afib ablation x 2 by Dr Rayann Heman with subsequent convergent procedure at [place]  2005   non obstructive CAD   CARDIOVERSION     CARDIOVERSION  12/09/2010   Procedure: CARDIOVERSION;  Surgeon: Loralie Champagne, MD;  Location: [place];  Service: Cardiovascular;  Laterality: N/A;   CARDIOVERSION  09/15/2011   Procedure: CARDIOVERSION;  Surgeon: Josue Hector, MD;  Location: [facility] ENDOSCOPY;  Service: Cardiovascular;  Laterality: N/A;  office drawing labs wed 8/28   CARDIOVERSION N/A 07/18/2012   Procedure: CARDIOVERSION;  Surgeon: Thayer Headings, MD;  Location: [facility] ENDOSCOPY;  Service: Cardiovascular;  Laterality: N/A;   CARDIOVERSION  08/2014   [place]  COLONOSCOPY  2009   Dr Sharlett Iles   convergent procedure  2013   at [facility] for a fib   JOINT REPLACEMENT Right 2008, 2012   LOOP RECORDER IMPLANT Left 12-2010   TONSILLECTOMY     TOTAL HIP ARTHROPLASTY  06/2010   Depuy prosthesis replacement, Dr Alvan Dame   TOTAL HIP ARTHROPLASTY Right 2008    Depuy;Dr Alvan Dame   Social History:  reports that he quit smoking about 47 years ago. His smoking use included cigarettes. He has a 7.20 pack-year smoking history. He has never used smokeless tobacco. He reports current alcohol use. He reports that he does not use drugs. Family History:  Family History  Problem  Relation Age of Onset   Diabetes Father    Stroke Father        mid 26s   Lung cancer Mother        non smoker   Stroke Mother 41   Testicular cancer Brother    Allergies Maternal Grandmother    Cancer Maternal Grandmother        unsure   Allergies Brother    Heart attack Neg Hx      HOME MEDICATIONS: Allergies as of 11/01/2021       Reactions   Farxiga [dapagliflozin] Other (See Comments)   Dry mouth, increased urination, increased appetite        Medication List        Accurate as of October 31, 2021  7:07 PM. If you have any questions, ask your nurse or doctor.          amLODipine 10 MG tablet Commonly known as: NORVASC Take 1 tablet (10 mg total) by mouth daily. Keep scheduled appt in Sept must see provider for future refills   atorvastatin 10 MG tablet Commonly known as: LIPITOR Take 1 tablet (10 mg total) by mouth daily.   augmented betamethasone dipropionate 0.05 % cream Commonly known as: DIPROLENE-AF Apply topically.   brimonidine 0.2 % ophthalmic solution Commonly known as: ALPHAGAN 1 drop 2 (two) times daily.   dorzolamide 2 % ophthalmic solution Commonly known as: TRUSOPT SMARTSIG:In Eye(s)   empagliflozin 25 MG Tabs tablet Commonly known as: Jardiance Take 1 tablet (25 mg total) by mouth daily before breakfast.   finasteride 1 MG tablet Commonly known as: PROPECIA Take 0.25 mg by mouth daily.   glipiZIDE 5 MG tablet Commonly known as: GLUCOTROL Take 1 tablet (5 mg total) by mouth 2 (two) times daily before a meal.   levothyroxine 88 MCG tablet Commonly known as: SYNTHROID Take 1 tablet (88 mcg total) by mouth daily.   lisinopril 2.5 MG tablet Commonly known as: ZESTRIL Take 1 tablet (2.5 mg total) by mouth daily.   Magnesium Oxide -Mg Supplement 500 MG Caps Take 1 capsule by mouth 2 (two) times daily.   meclizine 25 MG tablet Commonly known as: ANTIVERT Take 1 tablet (25 mg total) by mouth 3 (three) times daily as needed  for dizziness.   metFORMIN 500 MG 24 hr tablet Commonly known as: GLUCOPHAGE-XR TAKE 2 TABLETS TWICE A DAY   metoprolol succinate 100 MG 24 hr tablet Commonly known as: TOPROL-XL Take 2.5 tablets (250 mg total) by mouth daily. Take an extra 100 mg daily prn for palpitations.   Take with or immediately following a meal.   ramelteon 8 MG tablet Commonly known as: Rozerem Take 1 tablet (8 mg total) by mouth at bedtime.   rivaroxaban 20 MG Tabs tablet Commonly known as: Xarelto TAKE 1  TABLET DAILY WITH SUPPER   Rybelsus 14 MG Tabs Generic drug: Semaglutide Take 1 tablet (14 mg total) by mouth daily.   tamsulosin 0.4 MG Caps capsule Commonly known as: FLOMAX Take 0.4 mg by mouth daily.   timolol 0.5 % ophthalmic solution Commonly known as: TIMOPTIC 1 drop every morning.   traZODone 50 MG tablet Commonly known as: DESYREL Take 1-2 tablets (50-100 mg total) by mouth at bedtime as needed for sleep.   Vyzulta 0.024 % Soln Generic drug: Latanoprostene Bunod Place 1 drop into both eyes daily.         OBJECTIVE:   Vital Signs: There were no vitals taken for this visit.  Wt Readings from Last 3 Encounters:  09/28/21 168 lb (76.2 kg)  06/10/21 168 lb (76.2 kg)  05/12/21 169 lb 12.8 oz (77 kg)     Exam: General: Pt appears well and is in NAD  Lungs: Clear with good BS bilat   Heart: RRR   Extremities: No pretibial edema.   Neuro: MS is good with appropriate affect, pt is alert and Ox3     DM Foot Exam 12/07/2020  The skin of the feet is intact without sores or ulcerations. The pedal pulses are 2+ on right and 2+ on left. The sensation is intact to a screening 5.07, 10 gram monofilament bilaterally     DATA REVIEWED:  Lab Results  Component Value Date   HGBA1C 6.8 (A) 05/12/2021   HGBA1C 6.6 (A) 12/07/2020   HGBA1C 7.2 (H) 09/17/2020    Latest Reference Range & Units 09/17/20 11:02  Sodium 135 - 145 mEq/L 139  Potassium 3.5 - 5.1 mEq/L 4.9  Chloride  96 - 112 mEq/L 102  CO2 19 - 32 mEq/L 25  Glucose 70 - 99 mg/dL 194 (H)  BUN 6 - 23 mg/dL 15  Creatinine 0.40 - 1.50 mg/dL 0.87  Calcium 8.4 - 10.5 mg/dL 10.3  Alkaline Phosphatase 39 - 117 U/L 52  Albumin 3.5 - 5.2 g/dL 4.5  AST 0 - 37 U/L 14  ALT 0 - 53 U/L 16  Total Protein 6.0 - 8.3 g/dL 7.7  Total Bilirubin 0.2 - 1.2 mg/dL 0.5  GFR >60.00 mL/min 85.35    Latest Reference Range & Units 09/17/20 11:02  Total CHOL/HDL Ratio  2  Cholesterol 0 - 200 mg/dL 103  HDL Cholesterol >39.00 mg/dL 46.40  LDL (calc) 0 - 99 mg/dL 32  MICROALB/CREAT RATIO 0.0 - 30.0 mg/g 25.0  NonHDL  56.24  Triglycerides 0.0 - 149.0 mg/dL 119.0  VLDL 0.0 - 40.0 mg/dL 23.8     ASSESSMENT / PLAN / RECOMMENDATIONS:   1) Type 2 Diabetes Mellitus,Optimally controlled, With Microalbuminuria - Most recent A1c of 6.8 %. Goal A1c < 7.0 %.    -A1c remains at goal - Rybelsus is costly, I have offered discontinuation of this as he would not qualify for patient assistance programs.  But he would let me know when this is necessary - No changes    MEDICATIONS: - Continue Glipizide 5 mg, 1 tablet before Breakfast and 1 tablet before  - Continue Metformin 500 mg , 2 tablets twice a day  - Continue   jardiance 25 mg, 1 tablet with Breakfast  - Continue Rybelsus 14 mg daily    EDUCATION / INSTRUCTIONS: BG monitoring instructions: Patient is instructed to check his blood sugars 1 times a day, fasting  Call [facility] if: BG persistently < 70  I reviewed the Rule of 15  for the treatment of hypoglycemia in detail with the patient. Literature supplied.  2) Diabetic complications:  Eye: Does not have known diabetic retinopathy.  Neuro/ Feet: Does not have known diabetic peripheral neuropathy .  Renal: Patient does not have known baseline CKD. He   is  on an ACEI/ARB at present.      F/U in 6 months    Signed electronically by: Mack Guise, MD  [facility]  [facility] [address] [place], [place] [address] Phone: [phone] FAX: [phone]   CC: Binnie Rail, MD [place] [address] Phone: [phone]  Fax: [phone]  Return to Endocrinology clinic as below: Future Appointments  Date Time Provider Cooper  11/01/2021 10:50 AM Aralyn Nowak, Melanie Crazier, MD [facility] None  11/01/2021  3:00 PM Binnie Rail, MD [facility] None  01/25/2022  7:45 AM Hayden Pedro, MD [facility] None  03/29/2022 10:00 AM Binnie Rail, MD [facility] None

## 2021-11-01 ENCOUNTER — Encounter: Payer: Self-pay | Admitting: Internal Medicine

## 2021-11-01 ENCOUNTER — Ambulatory Visit (INDEPENDENT_AMBULATORY_CARE_PROVIDER_SITE_OTHER): Payer: Medicare Other | Admitting: Internal Medicine

## 2021-11-01 VITALS — BP 130/82 | HR 77 | Temp 98.1°F | Ht 68.0 in | Wt 167.0 lb

## 2021-11-01 VITALS — BP 138/76 | HR 79 | Ht 68.0 in | Wt 167.0 lb

## 2021-11-01 DIAGNOSIS — I10 Essential (primary) hypertension: Secondary | ICD-10-CM | POA: Diagnosis not present

## 2021-11-01 DIAGNOSIS — Z96643 Presence of artificial hip joint, bilateral: Secondary | ICD-10-CM | POA: Insufficient documentation

## 2021-11-01 DIAGNOSIS — H6991 Unspecified Eustachian tube disorder, right ear: Secondary | ICD-10-CM | POA: Diagnosis not present

## 2021-11-01 DIAGNOSIS — E119 Type 2 diabetes mellitus without complications: Secondary | ICD-10-CM | POA: Diagnosis not present

## 2021-11-01 LAB — POCT GLYCOSYLATED HEMOGLOBIN (HGB A1C): Hemoglobin A1C: 6.4 % — AB (ref 4.0–5.6)

## 2021-11-01 MED ORDER — AMOXICILLIN 500 MG PO TABS: ORAL_TABLET | ORAL | 0 refills | Status: DC

## 2021-11-01 MED ORDER — RYBELSUS 14 MG PO TABS: 14.0000 mg | ORAL_TABLET | Freq: Every day | ORAL | 3 refills | Status: DC

## 2021-11-01 NOTE — Assessment & Plan Note (Signed)
Chronic Blood pressure well controlled Continue amlodipine 10 mg daily, lisinopril 2.5 mg daily, metoprolol XL 250 mg daily

## 2021-11-01 NOTE — Assessment & Plan Note (Signed)
Chronic Intermittent Has had some intermittent symptoms over the past couple of years consistent with eustachian tube dysfunction-both right side and left side No evidence of infection Advised continuing to use Flonase daily or as needed Can start an antihistamine if needed No need for any additional treatment at this time Call with questions or concerns

## 2021-11-01 NOTE — Patient Instructions (Signed)
-   Continue Glipizide 5 mg, 1 tablet before Breakfast and 1 tablet before Supper  - Continue Metformin 500 mg , 2 tablets twice a day  - Continue  Rybelsus to 14 mg daily  - Continue  Jardiance 25 mg, 1 tablet with Breakfast    HOW TO TREAT LOW BLOOD SUGARS (Blood sugar LESS THAN 70 MG/DL) Please follow the RULE OF 15 for the treatment of hypoglycemia treatment (when your (blood sugars are less than 70 mg/dL)   STEP 1: Take 15 grams of carbohydrates when your blood sugar is low, which includes:  3-4 GLUCOSE TABS  OR 3-4 OZ OF JUICE OR REGULAR SODA OR ONE TUBE OF GLUCOSE GEL    STEP 2: RECHECK blood sugar in 15 MINUTES STEP 3: If your blood sugar is still low at the 15 minute recheck --> then, go back to STEP 1 and treat AGAIN with another 15 grams of carbohydrates.

## 2021-11-01 NOTE — Patient Instructions (Addendum)
     Medications changes include :   use the flonase as needed.  Consider adding an anti-histamine      Return if symptoms worsen or fail to improve.

## 2021-11-03 ENCOUNTER — Telehealth: Payer: Self-pay

## 2021-11-03 NOTE — Telephone Encounter (Signed)
Brenn Deziel (Key: [id number])  Your information has been sent to [facility].

## 2021-11-04 MED ORDER — ZOLPIDEM TARTRATE 10 MG PO TABS: 5.0000 mg | ORAL_TABLET | Freq: Every evening | ORAL | 0 refills | Status: DC | PRN

## 2021-11-04 NOTE — Addendum Note (Signed)
Addended by: Binnie Rail on: 11/04/2021 01:10 PM   Modules accepted: Orders

## 2021-11-29 DIAGNOSIS — D225 Melanocytic nevi of trunk: Secondary | ICD-10-CM | POA: Diagnosis not present

## 2021-11-29 DIAGNOSIS — D485 Neoplasm of uncertain behavior of skin: Secondary | ICD-10-CM | POA: Diagnosis not present

## 2021-12-20 DIAGNOSIS — H401111 Primary open-angle glaucoma, right eye, mild stage: Secondary | ICD-10-CM | POA: Diagnosis not present

## 2021-12-20 DIAGNOSIS — H4052X1 Glaucoma secondary to other eye disorders, left eye, mild stage: Secondary | ICD-10-CM | POA: Diagnosis not present

## 2021-12-21 ENCOUNTER — Telehealth: Payer: Self-pay | Admitting: Internal Medicine

## 2021-12-21 NOTE — Telephone Encounter (Signed)
N/A unable to leave a message for patient to call back to schedule Medicare Annual Wellness Visit   Last AWV  12/29/20  Please schedule at anytime with [facility] if patient calls the office back.    Any questions, please call me at [phone]

## 2021-12-31 ENCOUNTER — Encounter: Payer: Self-pay | Admitting: Internal Medicine

## 2022-01-01 ENCOUNTER — Encounter: Payer: Self-pay | Admitting: Internal Medicine

## 2022-01-01 MED ORDER — RIVAROXABAN 20 MG PO TABS: ORAL_TABLET | ORAL | 3 refills | Status: DC

## 2022-01-02 ENCOUNTER — Other Ambulatory Visit: Payer: Self-pay

## 2022-01-02 DIAGNOSIS — R739 Hyperglycemia, unspecified: Secondary | ICD-10-CM

## 2022-01-02 MED ORDER — EMPAGLIFLOZIN 25 MG PO TABS: 25.0000 mg | ORAL_TABLET | Freq: Every day | ORAL | 0 refills | Status: DC

## 2022-01-02 MED ORDER — EMPAGLIFLOZIN 25 MG PO TABS: 25.0000 mg | ORAL_TABLET | Freq: Every day | ORAL | 2 refills | Status: DC

## 2022-01-25 ENCOUNTER — Encounter (INDEPENDENT_AMBULATORY_CARE_PROVIDER_SITE_OTHER): Payer: Medicare Other | Admitting: Ophthalmology

## 2022-01-25 DIAGNOSIS — H43813 Vitreous degeneration, bilateral: Secondary | ICD-10-CM

## 2022-01-25 DIAGNOSIS — H35033 Hypertensive retinopathy, bilateral: Secondary | ICD-10-CM

## 2022-01-25 DIAGNOSIS — I10 Essential (primary) hypertension: Secondary | ICD-10-CM

## 2022-01-25 DIAGNOSIS — E113293 Type 2 diabetes mellitus with mild nonproliferative diabetic retinopathy without macular edema, bilateral: Secondary | ICD-10-CM | POA: Diagnosis not present

## 2022-02-07 DIAGNOSIS — J343 Hypertrophy of nasal turbinates: Secondary | ICD-10-CM | POA: Diagnosis not present

## 2022-02-07 DIAGNOSIS — H9042 Sensorineural hearing loss, unilateral, left ear, with unrestricted hearing on the contralateral side: Secondary | ICD-10-CM | POA: Diagnosis not present

## 2022-02-07 DIAGNOSIS — R42 Dizziness and giddiness: Secondary | ICD-10-CM | POA: Diagnosis not present

## 2022-02-07 DIAGNOSIS — H838X2 Other specified diseases of left inner ear: Secondary | ICD-10-CM | POA: Diagnosis not present

## 2022-02-07 DIAGNOSIS — J310 Chronic rhinitis: Secondary | ICD-10-CM | POA: Diagnosis not present

## 2022-02-07 DIAGNOSIS — R430 Anosmia: Secondary | ICD-10-CM | POA: Diagnosis not present

## 2022-03-28 ENCOUNTER — Encounter: Payer: Self-pay | Admitting: Internal Medicine

## 2022-03-28 NOTE — Patient Instructions (Addendum)
    Meds for osteoporosis - prolia (injection twice a year in our office), fosamax pill once a week, reclast infusion once a year ( done at pulmonary office)     Blood work was ordered.   The lab is on the first floor.    Medications changes include :   none     Return in about 6 months (around 09/29/2022) for follow up.

## 2022-03-29 ENCOUNTER — Ambulatory Visit (INDEPENDENT_AMBULATORY_CARE_PROVIDER_SITE_OTHER): Payer: Medicare Other | Admitting: Internal Medicine

## 2022-03-29 VITALS — BP 128/70 | HR 78 | Temp 98.1°F | Ht 68.0 in | Wt 173.0 lb

## 2022-03-29 DIAGNOSIS — I700 Atherosclerosis of aorta: Secondary | ICD-10-CM

## 2022-03-29 DIAGNOSIS — E039 Hypothyroidism, unspecified: Secondary | ICD-10-CM

## 2022-03-29 DIAGNOSIS — I4891 Unspecified atrial fibrillation: Secondary | ICD-10-CM | POA: Diagnosis not present

## 2022-03-29 DIAGNOSIS — E1165 Type 2 diabetes mellitus with hyperglycemia: Secondary | ICD-10-CM | POA: Diagnosis not present

## 2022-03-29 DIAGNOSIS — G479 Sleep disorder, unspecified: Secondary | ICD-10-CM

## 2022-03-29 DIAGNOSIS — I10 Essential (primary) hypertension: Secondary | ICD-10-CM

## 2022-03-29 DIAGNOSIS — E1129 Type 2 diabetes mellitus with other diabetic kidney complication: Secondary | ICD-10-CM | POA: Diagnosis not present

## 2022-03-29 DIAGNOSIS — E7849 Other hyperlipidemia: Secondary | ICD-10-CM

## 2022-03-29 DIAGNOSIS — R809 Proteinuria, unspecified: Secondary | ICD-10-CM

## 2022-03-29 LAB — CBC WITH DIFFERENTIAL/PLATELET
Basophils Absolute: 0.1 10*3/uL (ref 0.0–0.1)
Basophils Relative: 1.9 % (ref 0.0–3.0)
Eosinophils Absolute: 0.6 10*3/uL (ref 0.0–0.7)
Eosinophils Relative: 10.6 % — ABNORMAL HIGH (ref 0.0–5.0)
HCT: 51.3 % (ref 39.0–52.0)
Hemoglobin: 17.3 g/dL — ABNORMAL HIGH (ref 13.0–17.0)
Lymphocytes Relative: 24.9 % (ref 12.0–46.0)
Lymphs Abs: 1.5 10*3/uL (ref 0.7–4.0)
MCHC: 33.8 g/dL (ref 30.0–36.0)
MCV: 88.6 fl (ref 78.0–100.0)
Monocytes Absolute: 0.7 10*3/uL (ref 0.1–1.0)
Monocytes Relative: 12 % (ref 3.0–12.0)
Neutro Abs: 3.1 10*3/uL (ref 1.4–7.7)
Neutrophils Relative %: 50.6 % (ref 43.0–77.0)
Platelets: 213 10*3/uL (ref 150.0–400.0)
RBC: 5.79 Mil/uL (ref 4.22–5.81)
RDW: 15.9 % — ABNORMAL HIGH (ref 11.5–15.5)
WBC: 6.1 10*3/uL (ref 4.0–10.5)

## 2022-03-29 LAB — COMPREHENSIVE METABOLIC PANEL
ALT: 16 U/L (ref 0–53)
AST: 16 U/L (ref 0–37)
Albumin: 4.5 g/dL (ref 3.5–5.2)
Alkaline Phosphatase: 50 U/L (ref 39–117)
BUN: 17 mg/dL (ref 6–23)
CO2: 30 mEq/L (ref 19–32)
Calcium: 10.5 mg/dL (ref 8.4–10.5)
Chloride: 103 mEq/L (ref 96–112)
Creatinine, Ser: 0.97 mg/dL (ref 0.40–1.50)
GFR: 76.4 mL/min (ref >60.00)
Glucose, Bld: 169 mg/dL — ABNORMAL HIGH (ref 70–99)
Potassium: 4.7 mEq/L (ref 3.5–5.1)
Sodium: 141 mEq/L (ref 135–145)
Total Bilirubin: 0.6 mg/dL (ref 0.2–1.2)
Total Protein: 7.5 g/dL (ref 6.0–8.3)

## 2022-03-29 LAB — LIPID PANEL
Cholesterol: 118 mg/dL (ref 0–200)
HDL: 62.2 mg/dL (ref >39.00)
LDL Cholesterol: 35 mg/dL (ref 0–99)
NonHDL: 55.86
Total CHOL/HDL Ratio: 2
Triglycerides: 106 mg/dL (ref 0.0–149.0)
VLDL: 21.2 mg/dL (ref 0.0–40.0)

## 2022-03-29 LAB — TSH: TSH: 1.37 u[IU]/mL (ref 0.35–5.50)

## 2022-03-29 MED ORDER — METOPROLOL SUCCINATE ER 100 MG PO TB24: 250.0000 mg | ORAL_TABLET | Freq: Every day | ORAL | 3 refills | Status: DC

## 2022-03-29 MED ORDER — LISINOPRIL 2.5 MG PO TABS: 2.5000 mg | ORAL_TABLET | Freq: Every day | ORAL | 3 refills | Status: DC

## 2022-03-29 MED ORDER — LEVOTHYROXINE SODIUM 88 MCG PO TABS: 88.0000 ug | ORAL_TABLET | Freq: Every day | ORAL | 3 refills | Status: DC

## 2022-03-29 MED ORDER — ATORVASTATIN CALCIUM 10 MG PO TABS: 10.0000 mg | ORAL_TABLET | Freq: Every day | ORAL | 3 refills | Status: DC

## 2022-03-29 MED ORDER — RIVAROXABAN 20 MG PO TABS: ORAL_TABLET | ORAL | 3 refills | Status: DC

## 2022-03-29 MED ORDER — AMLODIPINE BESYLATE 10 MG PO TABS: 10.0000 mg | ORAL_TABLET | Freq: Every day | ORAL | 3 refills | Status: DC

## 2022-03-29 NOTE — Assessment & Plan Note (Signed)
Chronic  Clinically euthyroid Check tsh and will titrate med dose if needed Currently taking levothyroxine 88 mcg daily 

## 2022-03-29 NOTE — Progress Notes (Signed)
Subjective:    Patient ID: [name], male    DOB: March 01, 1945, 77 y.o.   MRN: [id number]     HPI [name] is here for follow up of his chronic medical problems.  Has been traveling a lot.  May have gained a few pounds.  Overall doing well.  Medications and allergies reviewed with patient and updated if appropriate.  Current Outpatient Medications on File Prior to Visit  Medication Sig Dispense Refill   amLODipine (NORVASC) 10 MG tablet Take 1 tablet (10 mg total) by mouth daily. Keep scheduled appt in Sept must see provider for future refills 90 tablet 3   amoxicillin (AMOXIL) 500 MG tablet Take 4 tabs one hour prior to dental work 4 tablet 0   atorvastatin (LIPITOR) 10 MG tablet Take 1 tablet (10 mg total) by mouth daily. 90 tablet 3   augmented betamethasone dipropionate (DIPROLENE-AF) 0.05 % cream Apply topically.     brimonidine (ALPHAGAN) 0.2 % ophthalmic solution 1 drop 2 (two) times daily.     dorzolamide (TRUSOPT) 2 % ophthalmic solution SMARTSIG:In Eye(s)     empagliflozin (JARDIANCE) 25 MG TABS tablet Take 1 tablet (25 mg total) by mouth daily before breakfast. 90 tablet 2   finasteride (PROPECIA) 1 MG tablet Take 0.25 mg by mouth daily.     glipiZIDE (GLUCOTROL) 5 MG tablet Take 1 tablet (5 mg total) by mouth 2 (two) times daily before a meal. 180 tablet 1   Latanoprostene Bunod (VYZULTA) 0.024 % SOLN Place 1 drop into both eyes daily.     levothyroxine (SYNTHROID) 88 MCG tablet Take 1 tablet (88 mcg total) by mouth daily. 90 tablet 3   lisinopril (ZESTRIL) 2.5 MG tablet Take 1 tablet (2.5 mg total) by mouth daily. 90 tablet 3   Magnesium Oxide 500 MG CAPS Take 1 capsule by mouth 2 (two) times daily.     metFORMIN (GLUCOPHAGE-XR) 500 MG 24 hr tablet TAKE 2 TABLETS TWICE A DAY 360 tablet 3   metoprolol succinate (TOPROL-XL) 100 MG 24 hr tablet Take 2.5 tablets (250 mg total) by mouth daily. Take an extra 100 mg daily prn for palpitations.   Take with or  immediately following a meal. 250 tablet 3   rivaroxaban (XARELTO) 20 MG TABS tablet TAKE 1 TABLET DAILY WITH SUPPER 90 tablet 3   Semaglutide (RYBELSUS) 14 MG TABS Take 1 tablet (14 mg total) by mouth daily. 90 tablet 3   tamsulosin (FLOMAX) 0.4 MG CAPS capsule Take 0.4 mg by mouth daily.     timolol (TIMOPTIC) 0.5 % ophthalmic solution 1 drop every morning.     zolpidem (AMBIEN) 10 MG tablet Take 0.5-1 tablets (5-10 mg total) by mouth at bedtime as needed for sleep. 90 tablet 0   No current facility-administered medications on file prior to visit.     Review of Systems  Constitutional:  Negative for fever.  Respiratory:  Negative for cough, shortness of breath and wheezing.   Cardiovascular:  Negative for chest pain, palpitations and leg swelling.  Neurological:  Negative for dizziness, light-headedness and headaches.       Objective:   Vitals:   03/29/22 0958  BP: 128/70  Pulse: 78  Temp: 98.1 F (36.7 C)  SpO2: 98%   BP Readings from Last 3 Encounters:  03/29/22 128/70  11/01/21 130/82  11/01/21 138/76   Wt Readings from Last 3 Encounters:  03/29/22 173 lb (78.5 kg)  11/01/21 167 lb (75.8 kg)  11/01/21  167 lb (75.8 kg)   Body mass index is 26.3 kg/m.    Physical Exam Constitutional:      General: He is not in acute distress.    Appearance: Normal appearance. He is not ill-appearing.  HENT:     Head: Normocephalic and atraumatic.  Eyes:     Conjunctiva/sclera: Conjunctivae normal.  Cardiovascular:     Rate and Rhythm: Normal rate and regular rhythm.     Heart sounds: Normal heart sounds.  Pulmonary:     Effort: Pulmonary effort is normal. No respiratory distress.     Breath sounds: Normal breath sounds. No wheezing or rales.  Musculoskeletal:     Right lower leg: No edema.     Left lower leg: No edema.  Skin:    General: Skin is warm and dry.     Findings: No rash.  Neurological:     Mental Status: He is alert. Mental status is at baseline.   Psychiatric:        Mood and Affect: Mood normal.        Lab Results  Component Value Date   WBC 7.6 09/28/2021   HGB 15.5 09/28/2021   HCT 46.7 09/28/2021   PLT 174.0 09/28/2021   GLUCOSE 173 (H) 09/28/2021   CHOL 110 09/28/2021   TRIG 123.0 09/28/2021   HDL 45.00 09/28/2021   LDLDIRECT 91.0 08/23/2016   LDLCALC 40 09/28/2021   ALT 12 09/28/2021   AST 14 09/28/2021   NA 139 09/28/2021   K 4.5 09/28/2021   CL 104 09/28/2021   CREATININE 0.93 09/28/2021   BUN 16 09/28/2021   CO2 26 09/28/2021   TSH 1.38 09/28/2021   PSA 0.76 04/25/2007   INR 2.6 09/26/2010   HGBA1C 6.4 (A) 11/01/2021   MICROALBUR 4.0 (H) 09/28/2021     Assessment & Plan:    See Problem List for Assessment and Plan of chronic medical problems.

## 2022-03-29 NOTE — Assessment & Plan Note (Signed)
Chronic   Lab Results  Component Value Date   HGBA1C 6.4 (A) 11/01/2021   Sugars controlled Management per endocrine Continue Jardiance, glipizide, metformin, Rybelsus Stressed regular exercise, diabetic diet

## 2022-03-29 NOTE — Assessment & Plan Note (Signed)
Chronic Continue atorvastatin 10 mg daily Regular exercise encouraged, healthy diet

## 2022-03-29 NOTE — Assessment & Plan Note (Signed)
Chronic Regular exercise and healthy diet encouraged Check lipid panel  Continue atorvastatin 10 mg daily 

## 2022-03-29 NOTE — Assessment & Plan Note (Signed)
Chronic Following with cardiology On Xarelto 20 mg daily, metoprolol XL 250 mg daily CBC, CMP, TSH

## 2022-03-29 NOTE — Assessment & Plan Note (Addendum)
Chronic Controlled, Stable Continue Ambien 5-10 mg bedtime as needed - does not take often

## 2022-03-29 NOTE — Assessment & Plan Note (Signed)
Chronic Blood pressure well controlled CMP Continue amlodipine 10 mg daily, lisinopril 2.5 mg daily, metoprolol XL 250 mg daily

## 2022-03-31 ENCOUNTER — Other Ambulatory Visit: Payer: Self-pay | Admitting: Internal Medicine

## 2022-03-31 DIAGNOSIS — R739 Hyperglycemia, unspecified: Secondary | ICD-10-CM

## 2022-03-31 DIAGNOSIS — D044 Carcinoma in situ of skin of scalp and neck: Secondary | ICD-10-CM | POA: Diagnosis not present

## 2022-03-31 DIAGNOSIS — L308 Other specified dermatitis: Secondary | ICD-10-CM | POA: Diagnosis not present

## 2022-04-10 ENCOUNTER — Telehealth: Payer: Self-pay

## 2022-04-10 NOTE — Telephone Encounter (Signed)
Contacted [name] to schedule their annual wellness visit. Appointment made for 05/02/22.  Norton Blizzard, [place] ([facility])  [facility] [phone]

## 2022-04-11 DIAGNOSIS — D044 Carcinoma in situ of skin of scalp and neck: Secondary | ICD-10-CM | POA: Diagnosis not present

## 2022-04-11 NOTE — Progress Notes (Unsigned)
Name: [name]  Age/ Sex: 77 y.o., male   MRN/ DOB: [id number], [birth year]     PCP: Binnie Rail, MD   Reason for Endocrinology Evaluation: Type 2 Diabetes Mellitus  Initial Endocrine Consultative Visit: 10/28/2019    PATIENT IDENTIFIER: [name] is a 77 y.o. male with a past medical history of T2Dm, A.Fib  and Dyslipidemia.  The patient has followed with Endocrinology clinic since 10/28/2019 for consultative assistance with management of his diabetes.      DIABETIC HISTORY:  [name] was diagnosed with DM many years ago.Farxiga- dry mouth and frequency. His hemoglobin A1c has ranged from 6.5% in 2015, peaking at 8.5% in 2018  On his initial visit to our clinic his A1c was 7.6% He had concerns about the cost of Jardiance, [place] and Januvia.    He was on Glimepiride and Metformin and we switched Glimepiride to Glipizide   Rybelsus started 06/2020  SUBJECTIVE:   During the last visit (11/01/2021): A1c 6.4%      Today (04/12/2022): [name] is here for a follow up on diabetes management.  He checks his blood sugars 1 times daily.  The patient has not had hypoglycemic episodes since the last clinic visit.    He follows with cardiology for A-fib  Denies nausea, vomiting or diarrhea  Denies new urinary center  Has occasional constipation , he just returned from a cruise to the [place]:  Glipizide 5 mg, 1 tablet BID Metformin 500 mg , 2 tablets twice a day  Jardiance 25 mg daily  Rybelsus 14 mg daily    Statin: Yes ACE-I/ARB: no    METER DOWNLOAD SUMMARY:  7 day average 111 mg/dL   86 - 157 mg/dL   DIABETIC COMPLICATIONS: Microvascular complications:  Glaucoma  Denies: CKD, retinopathy, neuropathy Last Eye Exam: Completed 01/2022  Macrovascular complications:   Denies: CAD, CVA, PVD   HISTORY:  Past Medical History:  Past Medical History:  Diagnosis Date   AF (atrial fibrillation) ([place])     persistent afib, s/p afib ablation x 2 with subsequent convergent procedure at [facility]   Allergic rhinitis    seasonal   Atrial flutter ([place])    CTS (carpal tunnel syndrome)    PMH of   Diabetes mellitus    type 2   External hemorrhoids    Glaucoma    Hyperlipidemia    Hypertension    Hypothyroidism    Renal calculus 2005   [facility]   Past Surgical History:  Past Surgical History:  Procedure Laterality Date   ABLATION  06/2013   atypical atrial flutter ablation by Dr Lehman Prom at [facility] (mitral annular flutter ablated)   afib 09/22/2008--11/03/2009     afib ablation x 2 by Dr Rayann Heman with subsequent convergent procedure at [facility]  2005   non obstructive CAD   CARDIOVERSION     CARDIOVERSION  12/09/2010   Procedure: CARDIOVERSION;  Surgeon: Loralie Champagne, MD;  Location: [facility];  Service: Cardiovascular;  Laterality: N/A;   CARDIOVERSION  09/15/2011   Procedure: CARDIOVERSION;  Surgeon: Josue Hector, MD;  Location: [facility] ENDOSCOPY;  Service: Cardiovascular;  Laterality: N/A;  office drawing labs wed 8/28   CARDIOVERSION N/A 07/18/2012   Procedure: CARDIOVERSION;  Surgeon: Thayer Headings, MD;  Location: [place];  Service: Cardiovascular;  Laterality: N/A;   CARDIOVERSION  08/2014   [place]   COLONOSCOPY  2009   Dr  Patterson   convergent procedure  2013   at [facility] for a fib   JOINT REPLACEMENT Right 2008, 2012   LOOP RECORDER IMPLANT Left 12-2010   TONSILLECTOMY     TOTAL HIP ARTHROPLASTY  06/2010   Depuy prosthesis replacement, Dr Alvan Dame   TOTAL HIP ARTHROPLASTY Right 2008    Depuy;Dr Alvan Dame   Social History:  reports that he quit smoking about 48 years ago. His smoking use included cigarettes. He has a 7.20 pack-year smoking history. He has never used smokeless tobacco. He reports current alcohol use. He reports that he does not use drugs. Family History:  Family History  Problem Relation Age of Onset   Diabetes Father    Stroke Father        mid  86s   Lung cancer Mother        non smoker   Stroke Mother 35   Testicular cancer Brother    Allergies Maternal Grandmother    Cancer Maternal Grandmother        unsure   Allergies Brother    Heart attack Neg Hx      HOME MEDICATIONS: Allergies as of 04/12/2022       Reactions   Farxiga [dapagliflozin] Other (See Comments)   Dry mouth, increased urination, increased appetite        Medication List        Accurate as of April 12, 2022  8:01 AM. If you have any questions, ask your nurse or doctor.          STOP taking these medications    amoxicillin 500 MG tablet Commonly known as: AMOXIL Stopped by: Dorita Sciara, MD       TAKE these medications    amLODipine 10 MG tablet Commonly known as: NORVASC Take 1 tablet (10 mg total) by mouth daily.   atorvastatin 10 MG tablet Commonly known as: LIPITOR Take 1 tablet (10 mg total) by mouth daily.   augmented betamethasone dipropionate 0.05 % cream Commonly known as: DIPROLENE-AF Apply topically.   brimonidine 0.2 % ophthalmic solution Commonly known as: ALPHAGAN 1 drop 2 (two) times daily.   dorzolamide 2 % ophthalmic solution Commonly known as: TRUSOPT SMARTSIG:In Eye(s)   finasteride 1 MG tablet Commonly known as: PROPECIA Take 0.25 mg by mouth daily.   glipiZIDE 5 MG tablet Commonly known as: GLUCOTROL Take 1 tablet (5 mg total) by mouth 2 (two) times daily before a meal.   Jardiance 25 MG Tabs tablet Generic drug: empagliflozin TAKE 1 TABLET BY MOUTH DAILY BEFORE BREAKFAST.   levothyroxine 88 MCG tablet Commonly known as: SYNTHROID Take 1 tablet (88 mcg total) by mouth daily.   lisinopril 2.5 MG tablet Commonly known as: ZESTRIL Take 1 tablet (2.5 mg total) by mouth daily.   Magnesium Oxide -Mg Supplement 500 MG Caps Take 1 capsule by mouth 2 (two) times daily.   metFORMIN 500 MG 24 hr tablet Commonly known as: GLUCOPHAGE-XR TAKE 2 TABLETS TWICE A DAY   metoprolol succinate  100 MG 24 hr tablet Commonly known as: TOPROL-XL Take 2.5 tablets (250 mg total) by mouth daily. Take an extra 100 mg daily prn for palpitations.   Take with or immediately following a meal.   rivaroxaban 20 MG Tabs tablet Commonly known as: Xarelto TAKE 1 TABLET DAILY WITH SUPPER   Rybelsus 14 MG Tabs Generic drug: Semaglutide Take 1 tablet (14 mg total) by mouth daily.   tamsulosin 0.4 MG Caps capsule Commonly known as: FLOMAX Take  0.4 mg by mouth daily.   timolol 0.5 % ophthalmic solution Commonly known as: TIMOPTIC 1 drop every morning.   Vyzulta 0.024 % Soln Generic drug: Latanoprostene Bunod Place 1 drop into both eyes daily.   zolpidem 10 MG tablet Commonly known as: AMBIEN Take 0.5-1 tablets (5-10 mg total) by mouth at bedtime as needed for sleep.         OBJECTIVE:   Vital Signs: BP 100/66 (BP Location: Left Arm, Patient Position: Sitting, Cuff Size: Small)   Pulse 80   Ht 5\' 8"  (1.727 m)   Wt 171 lb (77.6 kg)   SpO2 97%   BMI 26.00 kg/m   Wt Readings from Last 3 Encounters:  04/12/22 171 lb (77.6 kg)  03/29/22 173 lb (78.5 kg)  11/01/21 167 lb (75.8 kg)     Exam: General: Pt appears well and is in NAD  Lungs: Clear with good BS bilat   Heart: RRR   Extremities: No pretibial edema.   Neuro: MS is good with appropriate affect, pt is alert and Ox3     DM Foot Exam 11/01/2021  The skin of the feet is intact without sores or ulcerations. The pedal pulses are 2+ on right and 2+ on left. The sensation is intact to a screening 5.07, 10 gram monofilament bilaterally     DATA REVIEWED:  Lab Results  Component Value Date   HGBA1C 6.4 (A) 11/01/2021   HGBA1C 6.8 (A) 05/12/2021   HGBA1C 6.6 (A) 12/07/2020     Latest Reference Range & Units 03/29/22 10:53  Sodium 135 - 145 mEq/L 141  Potassium 3.5 - 5.1 mEq/L 4.7  Chloride 96 - 112 mEq/L 103  CO2 19 - 32 mEq/L 30  Glucose 70 - 99 mg/dL 169 (H)  BUN 6 - 23 mg/dL 17  Creatinine 0.40 - 1.50  mg/dL 0.97  Calcium 8.4 - 10.5 mg/dL 10.5  Alkaline Phosphatase 39 - 117 U/L 50  Albumin 3.5 - 5.2 g/dL 4.5  AST 0 - 37 U/L 16  ALT 0 - 53 U/L 16  Total Protein 6.0 - 8.3 g/dL 7.5  Total Bilirubin 0.2 - 1.2 mg/dL 0.6  GFR >60.00 mL/min 76.40  Total CHOL/HDL Ratio  2  Cholesterol 0 - 200 mg/dL 118  HDL Cholesterol >39.00 mg/dL 62.20  LDL (calc) 0 - 99 mg/dL 35  NonHDL  55.86  Triglycerides 0.0 - 149.0 mg/dL 106.0  VLDL 0.0 - 40.0 mg/dL 21.2    ASSESSMENT / PLAN / RECOMMENDATIONS:   1) Type 2 Diabetes Mellitus,Optimally controlled, Without complications - Most recent A1c of 6.5 %. Goal A1c < 7.0 %.    -A1c remains at goal - No changes at this time    MEDICATIONS: - Continue Glipizide 5 mg, 1 tablet before Breakfast and 1 tablet before  - Continue Metformin 500 mg , 2 tablets twice a day  - Continue  Jardiance 25 mg, 1 tablet with Breakfast  - Continue Rybelsus 14 mg daily    EDUCATION / INSTRUCTIONS: BG monitoring instructions: Patient is instructed to check his blood sugars 1 times every other day. Call [facility] if: BG persistently < 70  I reviewed the Rule of 15 for the treatment of hypoglycemia in detail with the patient. Literature supplied.  2) Diabetic complications:  Eye: Does not have known diabetic retinopathy.  Neuro/ Feet: Does not have known diabetic peripheral neuropathy .  Renal: Patient does not have known baseline CKD. He   is  on an  ACEI/ARB at present.      F/U in 6 months    Signed electronically by: Mack Guise, MD  [facility]  [facility] [address] [place], [place] [address] Phone: [phone] FAX: [phone]   CC: Binnie Rail, MD [place] [address] Phone: [phone]  Fax: [phone]  Return to Endocrinology clinic as below: Future Appointments  Date Time Provider [place]  04/12/2022  8:10 AM Hristopher Missildine, Melanie Crazier,  MD [facility] None  05/02/2022 10:45 AM [facility]-ANNUAL WELLNESS VISIT [facility] None  10/12/2022 10:00 AM Binnie Rail, MD [facility] None  01/24/2023  7:50 AM Hayden Pedro, MD [facility] None

## 2022-04-12 ENCOUNTER — Encounter: Payer: Self-pay | Admitting: Internal Medicine

## 2022-04-12 ENCOUNTER — Ambulatory Visit (INDEPENDENT_AMBULATORY_CARE_PROVIDER_SITE_OTHER): Payer: Medicare Other | Admitting: Internal Medicine

## 2022-04-12 VITALS — BP 100/66 | HR 80 | Ht 68.0 in | Wt 171.0 lb

## 2022-04-12 DIAGNOSIS — E119 Type 2 diabetes mellitus without complications: Secondary | ICD-10-CM

## 2022-04-12 LAB — POCT GLYCOSYLATED HEMOGLOBIN (HGB A1C): Hemoglobin A1C: 6.5 % — AB (ref 4.0–5.6)

## 2022-04-12 MED ORDER — METFORMIN HCL ER 500 MG PO TB24: ORAL_TABLET | ORAL | 3 refills | Status: DC

## 2022-04-12 MED ORDER — EMPAGLIFLOZIN 25 MG PO TABS: 25.0000 mg | ORAL_TABLET | Freq: Every day | ORAL | 3 refills | Status: DC

## 2022-04-12 NOTE — Patient Instructions (Signed)
-   Continue Glipizide 5 mg, 1 tablet before Breakfast and 1 tablet before Supper  - Continue Metformin 500 mg , 2 tablets twice a day  - Continue  Rybelsus to 14 mg daily  - Continue  Jardiance 25 mg, 1 tablet with Breakfast    HOW TO TREAT LOW BLOOD SUGARS (Blood sugar LESS THAN 70 MG/DL) Please follow the RULE OF 15 for the treatment of hypoglycemia treatment (when your (blood sugars are less than 70 mg/dL)   STEP 1: Take 15 grams of carbohydrates when your blood sugar is low, which includes:  3-4 GLUCOSE TABS  OR 3-4 OZ OF JUICE OR REGULAR SODA OR ONE TUBE OF GLUCOSE GEL    STEP 2: RECHECK blood sugar in 15 MINUTES STEP 3: If your blood sugar is still low at the 15 minute recheck --> then, go back to STEP 1 and treat AGAIN with another 15 grams of carbohydrates. 

## 2022-04-19 ENCOUNTER — Ambulatory Visit: Payer: Medicare Other | Admitting: Internal Medicine

## 2022-05-01 DIAGNOSIS — H401111 Primary open-angle glaucoma, right eye, mild stage: Secondary | ICD-10-CM | POA: Diagnosis not present

## 2022-05-01 DIAGNOSIS — H4052X1 Glaucoma secondary to other eye disorders, left eye, mild stage: Secondary | ICD-10-CM | POA: Diagnosis not present

## 2022-05-02 ENCOUNTER — Ambulatory Visit (INDEPENDENT_AMBULATORY_CARE_PROVIDER_SITE_OTHER): Payer: Medicare Other

## 2022-05-02 VITALS — Ht 68.0 in | Wt 167.0 lb

## 2022-05-02 DIAGNOSIS — Z0000 Encounter for general adult medical examination without abnormal findings: Secondary | ICD-10-CM

## 2022-05-02 NOTE — Patient Instructions (Signed)
[name] , Thank you for taking time to come for your Medicare Wellness Visit. I appreciate your ongoing commitment to your health goals. Please review the following plan we discussed and let me know if I can assist you in the future.   These are the goals we discussed:  Goals       Diabetes Patient stated goal (pt-stated)      My goal is to monitor HgA1C by watching my sugar intake, staying hydrated and being physically active.  I recently started Rybelsus and it has decreased my appetite and HgA1C numbers.      Manage My Medicine      Timeframe:  Long-Range Goal Priority:  High Start Date:     03/02/20                        Expected End Date:       03/02/21              Follow Up Date 09/15/20   - call for medicine refill 2 or 3 days before it runs out - call if I am sick and can't take my medicine - keep a list of all the medicines I take; vitamins and herbals too - use a pillbox to sort medicine  -Monitor for low sugars < 70. Contact prescriber if any low sugars. -Contact Medicare regarding donut hole/catastrophic coverage estimates   Why is this important?   These steps will help you keep on track with your medicines.       Patient Stated      Lose weight by monitoring my diet and eating more veg. Increase my activity by starting to walk again.       Patient Stated      I want to increase my physical activity by walking more routinely.        This is a list of the screening recommended for you and due dates:  Health Maintenance  Topic Date Due   Zoster (Shingles) Vaccine (1 of 2) Never done   Flu Shot  08/17/2022   Colon Cancer Screening  09/04/2022   Yearly kidney health urinalysis for diabetes  09/29/2022   Hemoglobin A1C  10/13/2022   Complete foot exam   11/02/2022   Eye exam for diabetics  01/17/2023   Yearly kidney function blood test for diabetes  03/29/2023   Medicare Annual Wellness Visit  05/02/2023   DTaP/Tdap/Td vaccine (3 - Td or Tdap) 03/19/2031    Pneumonia Vaccine  Completed   Hepatitis C Screening: USPSTF Recommendation to screen - Ages 60-79 yo.  Completed   HPV Vaccine  Aged Out   COVID-19 Vaccine  Discontinued    Advanced directives:   will bring copy to office   Conditions/risks identified: Keep up the good work  Next appointment: Follow up in one year for your annual wellness visit. 05/03/23  Preventive Care 65 Years and Older, Male  Preventive care refers to lifestyle choices and visits with your health care provider that can promote health and wellness. What does preventive care include? A yearly physical exam. This is also called an annual well check. Dental exams once or twice a year. Routine eye exams. Ask your health care provider how often you should have your eyes checked. Personal lifestyle choices, including: Daily care of your teeth and gums. Regular physical activity. Eating a healthy diet. Avoiding tobacco and drug use. Limiting alcohol use. Practicing safe sex. Taking low doses of  aspirin every day. Taking vitamin and mineral supplements as recommended by your health care provider. What happens during an annual well check? The services and screenings done by your health care provider during your annual well check will depend on your age, overall health, lifestyle risk factors, and family history of disease. Counseling  Your health care provider may ask you questions about your: Alcohol use. Tobacco use. Drug use. Emotional well-being. Home and relationship well-being. Sexual activity. Eating habits. History of falls. Memory and ability to understand (cognition). Work and work Astronomer. Screening  You may have the following tests or measurements: Height, weight, and BMI. Blood pressure. Lipid and cholesterol levels. These may be checked every 5 years, or more frequently if you are over 68 years old. Skin check. Lung cancer screening. You may have this screening every year starting at age  24 if you have a 30-pack-year history of smoking and currently smoke or have quit within the past 15 years. Fecal occult blood test (FOBT) of the stool. You may have this test every year starting at age 60. Flexible sigmoidoscopy or colonoscopy. You may have a sigmoidoscopy every 5 years or a colonoscopy every 10 years starting at age 25. Prostate cancer screening. Recommendations will vary depending on your family history and other risks. Hepatitis C blood test. Hepatitis B blood test. Sexually transmitted disease (STD) testing. Diabetes screening. This is done by checking your blood sugar (glucose) after you have not eaten for a while (fasting). You may have this done every 1-3 years. Abdominal aortic aneurysm (AAA) screening. You may need this if you are a current or former smoker. Osteoporosis. You may be screened starting at age 28 if you are at high risk. Talk with your health care provider about your test results, treatment options, and if necessary, the need for more tests. Vaccines  Your health care provider may recommend certain vaccines, such as: Influenza vaccine. This is recommended every year. Tetanus, diphtheria, and acellular pertussis (Tdap, Td) vaccine. You may need a Td booster every 10 years. Zoster vaccine. You may need this after age 48. Pneumococcal 13-valent conjugate (PCV13) vaccine. One dose is recommended after age 56. Pneumococcal polysaccharide (PPSV23) vaccine. One dose is recommended after age 34. Talk to your health care provider about which screenings and vaccines you need and how often you need them. This information is not intended to replace advice given to you by your health care provider. Make sure you discuss any questions you have with your health care provider. Document Released: 01/29/2015 Document Revised: 09/22/2015 Document Reviewed: 11/03/2014 Elsevier Interactive Patient Education  2017 [employer].  Fall Prevention in the Home Falls can cause  injuries. They can happen to people of all ages. There are many things you can do to make your home safe and to help prevent falls. What can I do on the outside of my home? Regularly fix the edges of walkways and driveways and fix any cracks. Remove anything that might make you trip as you walk through a door, such as a raised step or threshold. Trim any bushes or trees on the path to your home. Use bright outdoor lighting. Clear any walking paths of anything that might make someone trip, such as rocks or tools. Regularly check to see if handrails are loose or broken. Make sure that both sides of any steps have handrails. Any raised decks and porches should have guardrails on the edges. Have any leaves, snow, or ice cleared regularly. Use sand or salt on  walking paths during winter. Clean up any spills in your garage right away. This includes oil or grease spills. What can I do in the bathroom? Use night lights. Install grab bars by the toilet and in the tub and shower. Do not use towel bars as grab bars. Use non-skid mats or decals in the tub or shower. If you need to sit down in the shower, use a plastic, non-slip stool. Keep the floor dry. Clean up any water that spills on the floor as soon as it happens. Remove soap buildup in the tub or shower regularly. Attach bath mats securely with double-sided non-slip rug tape. Do not have throw rugs and other things on the floor that can make you trip. What can I do in the bedroom? Use night lights. Make sure that you have a light by your bed that is easy to reach. Do not use any sheets or blankets that are too big for your bed. They should not hang down onto the floor. Have a firm chair that has side arms. You can use this for support while you get dressed. Do not have throw rugs and other things on the floor that can make you trip. What can I do in the kitchen? Clean up any spills right away. Avoid walking on wet floors. Keep items that you  use a lot in easy-to-reach places. If you need to reach something above you, use a strong step stool that has a grab bar. Keep electrical cords out of the way. Do not use floor polish or wax that makes floors slippery. If you must use wax, use non-skid floor wax. Do not have throw rugs and other things on the floor that can make you trip. What can I do with my stairs? Do not leave any items on the stairs. Make sure that there are handrails on both sides of the stairs and use them. Fix handrails that are broken or loose. Make sure that handrails are as long as the stairways. Check any carpeting to make sure that it is firmly attached to the stairs. Fix any carpet that is loose or worn. Avoid having throw rugs at the top or bottom of the stairs. If you do have throw rugs, attach them to the floor with carpet tape. Make sure that you have a light switch at the top of the stairs and the bottom of the stairs. If you do not have them, ask someone to add them for you. What else can I do to help prevent falls? Wear shoes that: Do not have high heels. Have rubber bottoms. Are comfortable and fit you well. Are closed at the toe. Do not wear sandals. If you use a stepladder: Make sure that it is fully opened. Do not climb a closed stepladder. Make sure that both sides of the stepladder are locked into place. Ask someone to hold it for you, if possible. Clearly mark and make sure that you can see: Any grab bars or handrails. First and last steps. Where the edge of each step is. Use tools that help you move around (mobility aids) if they are needed. These include: Canes. Walkers. Scooters. Crutches. Turn on the lights when you go into a dark area. Replace any light bulbs as soon as they burn out. Set up your furniture so you have a clear path. Avoid moving your furniture around. If any of your floors are uneven, fix them. If there are any pets around you, be aware of where they are.  Review your  medicines with your doctor. Some medicines can make you feel dizzy. This can increase your chance of falling. Ask your doctor what other things that you can do to help prevent falls. This information is not intended to replace advice given to you by your health care provider. Make sure you discuss any questions you have with your health care provider. Document Released: 10/29/2008 Document Revised: 06/10/2015 Document Reviewed: 02/06/2014 Elsevier Interactive Patient Education  2017 [employer].

## 2022-05-02 NOTE — Progress Notes (Addendum)
Subjective:   [name] is a 77 y.o. male who presents for Medicare Annual/Subsequent preventive examination.  I connected with  [name] on 05/02/22 by a audio enabled telemedicine application and verified that I am speaking with the correct person using two identifiers.  Patient Location: Home  Provider Location: Home Office  I discussed the limitations of evaluation and management by telemedicine. The patient expressed understanding and agreed to proceed.     Review of Systems          Objective:    Today's Vitals   05/02/22 1039  Weight: 167 lb (75.8 kg)  Height: 5\' 8"  (1.727 m)   Body mass index is 25.39 kg/m.     05/02/2022   10:48 AM 12/29/2020   11:22 AM 11/03/2019   10:04 AM 09/04/2018   11:07 AM 06/26/2017   10:24 AM 02/04/2016   10:10 AM 07/18/2012   12:32 PM  Advanced Directives  Does Patient Have a Medical Advance Directive? Yes Yes Yes Yes Yes No Patient does not have advance directive  Type of Advance Directive Healthcare Power of [place];Living will Living will;Healthcare Power of Attorney Living will;Healthcare Power of [employer] Power of [place];Living will Healthcare Power of [place];Living will    Does patient want to make changes to medical advance directive?  No - Patient declined No - Patient declined      Copy of Healthcare Power of Attorney in Chart? No - copy requested No - copy requested No - copy requested No - copy requested No - copy requested    Pre-existing out of facility DNR order (yellow form or pink MOST form)       No    Current Medications (verified) Outpatient Encounter Medications as of 05/02/2022  Medication Sig   amLODipine (NORVASC) 10 MG tablet Take 1 tablet (10 mg total) by mouth daily.   atorvastatin (LIPITOR) 10 MG tablet Take 1 tablet (10 mg total) by mouth daily.   augmented betamethasone dipropionate (DIPROLENE-AF) 0.05 % cream Apply topically.   brimonidine (ALPHAGAN) 0.2 % ophthalmic  solution 1 drop 2 (two) times daily.   dorzolamide (TRUSOPT) 2 % ophthalmic solution SMARTSIG:In Eye(s)   empagliflozin (JARDIANCE) 25 MG TABS tablet Take 1 tablet (25 mg total) by mouth daily before breakfast.   finasteride (PROPECIA) 1 MG tablet Take 0.25 mg by mouth daily.   glipiZIDE (GLUCOTROL) 5 MG tablet Take 1 tablet (5 mg total) by mouth 2 (two) times daily before a meal.   Latanoprostene Bunod (VYZULTA) 0.024 % SOLN Place 1 drop into both eyes daily.   levothyroxine (SYNTHROID) 88 MCG tablet Take 1 tablet (88 mcg total) by mouth daily.   lisinopril (ZESTRIL) 2.5 MG tablet Take 1 tablet (2.5 mg total) by mouth daily.   Magnesium Oxide 500 MG CAPS Take 1 capsule by mouth 2 (two) times daily.   metFORMIN (GLUCOPHAGE-XR) 500 MG 24 hr tablet TAKE 2 TABLETS TWICE A DAY   metoprolol succinate (TOPROL-XL) 100 MG 24 hr tablet Take 2.5 tablets (250 mg total) by mouth daily. Take an extra 100 mg daily prn for palpitations.   Take with or immediately following a meal.   rivaroxaban (XARELTO) 20 MG TABS tablet TAKE 1 TABLET DAILY WITH SUPPER   Semaglutide (RYBELSUS) 14 MG TABS Take 1 tablet (14 mg total) by mouth daily.   tamsulosin (FLOMAX) 0.4 MG CAPS capsule Take 0.4 mg by mouth daily.   timolol (TIMOPTIC) 0.5 % ophthalmic solution 1 drop every morning.  zolpidem (AMBIEN) 10 MG tablet Take 0.5-1 tablets (5-10 mg total) by mouth at bedtime as needed for sleep.   No facility-administered encounter medications on file as of 05/02/2022.    Allergies (verified) Farxiga [dapagliflozin]   History: Past Medical History:  Diagnosis Date   AF (atrial fibrillation)    persistent afib, s/p afib ablation x 2 with subsequent convergent procedure at [facility]   Allergic rhinitis    seasonal   Atrial flutter    CTS (carpal tunnel syndrome)    PMH of   Diabetes mellitus    type 2   External hemorrhoids    Glaucoma    Hyperlipidemia    Hypertension    Hypothyroidism    Renal calculus 2005   [facility]   Past Surgical History:  Procedure Laterality Date   ABLATION  06/16/2013   atypical atrial flutter ablation by Dr Hurman Horn at [facility] (mitral annular flutter ablated)   afib 09/22/2008--11/03/2009     afib ablation x 2 by Dr Johney Frame with subsequent convergent procedure at [facility]   CARDIAC CATHETERIZATION  01/17/2003   non obstructive CAD   CARDIOVERSION     CARDIOVERSION  12/09/2010   Procedure: CARDIOVERSION;  Surgeon: Marca Ancona, MD;  Location[facility];  Service: Cardiovascular;  Laterality: N/A;   CARDIOVERSION  09/15/2011   Procedure: CARDIOVERSION;  Surgeon: Wendall Stade, MD;  Location: [facility] ENDOSCOPY;  Service: Cardiovascular;  Laterality: N/A;  office drawing labs wed 8/28   CARDIOVERSION N/A 07/18/2012   Procedure: CARDIOVERSION;  Surgeon: Vesta Mixer, MD;  Location: [facility] ENDOSCOPY;  Service: Cardiovascular;  Laterality: N/A;   CARDIOVERSION  08/17/2014   [place]   COLONOSCOPY  01/17/2007   Dr Jarold Motto   convergent procedure  01/17/2011   at [facility] for a fib   JOINT REPLACEMENT Right 2008, 2012   LOOP RECORDER IMPLANT Left 12/17/2010   SQUAMOUS CELL CARCINOMA EXCISION     TONSILLECTOMY     TOTAL HIP ARTHROPLASTY  06/17/2010   Depuy prosthesis replacement, Dr Charlann Boxer   TOTAL HIP ARTHROPLASTY Right 01/16/2006    Depuy;Dr Charlann Boxer   Family History  Problem Relation Age of Onset   Diabetes Father    Stroke Father        mid 9s   Lung cancer Mother        non smoker   Stroke Mother 66   Testicular cancer Brother    Allergies Maternal Grandmother    Cancer Maternal Grandmother        unsure   Allergies Brother    Heart attack Neg Hx    Social History   Socioeconomic History   Marital status: Married    Spouse name: [name]   Number of children: 2   Years of education: Not on file   Highest education level: Not on file  Occupational History   Occupation: Actor: [employer]  Tobacco Use   Smoking status: Former    Packs/day: 0.90     Years: 8.00    Additional pack years: 0.00    Total pack years: 7.20    Types: Cigarettes    Quit date: 01/16/1974    Years since quitting: 48.3   Smokeless tobacco: Never   Tobacco comments:    smoked  1966- 1976,up to 1 ppd  Vaping Use   Vaping Use: Never used  Substance and Sexual Activity   Alcohol use: Yes    Comment: socially   Drug use:  No   Sexual activity: Not on file  Other Topics Concern   Not on file  Social History Narrative   Livings with wife [name]   Social Determinants of Health   Financial Resource Strain: Low Risk  (05/02/2022)   Overall Financial Resource Strain (CARDIA)    Difficulty of Paying Living Expenses: Not hard at all  Food Insecurity: No Food Insecurity (05/02/2022)   Hunger Vital Sign    Worried About Running Out of Food in the Last Year: Never true    Ran Out of Food in the Last Year: Never true  Transportation Needs: No Transportation Needs (05/02/2022)   PRAPARE - Administrator, Civil Service (Medical): No    Lack of Transportation (Non-Medical): No  Physical Activity: Inactive (05/02/2022)   Exercise Vital Sign    Days of Exercise per Week: 0 days    Minutes of Exercise per Session: 0 min  Stress: No Stress Concern Present (05/02/2022)   [employer] of Occupational Health - Occupational Stress Questionnaire    Feeling of Stress : Only a little  Social Connections: Unknown (05/02/2022)   Social Connection and Isolation Panel [NHANES]    Frequency of Communication with Friends and Family: Twice a week    Frequency of Social Gatherings with Friends and Family: Once a week    Attends Religious Services: Not on Marketing executive or Organizations: No    Attends Banker Meetings: Never    Marital Status: Married    Tobacco Counseling Counseling given: Not Answered Tobacco comments: smoked  1966- 1976,up to 1 ppd   Clinical Intake:  Pre-visit preparation completed: Yes  Pain : No/denies pain      BMI - recorded: 25.39 Nutritional Risks: None Diabetes: No  How often do you need to have someone help you when you read instructions, pamphlets, or other written materials from your doctor or pharmacy?: 1 - Never  Diabetic?  Yes  Interpreter Needed?: No  Information entered by :: Kandis Cocking, CMA   Activities of Daily Living    04/29/2022   11:26 AM  In your present state of health, do you have any difficulty performing the following activities:  Hearing? 0  Vision? 0  Difficulty concentrating or making decisions? 0  Walking or climbing stairs? 0  Dressing or bathing? 0  Doing errands, shopping? 0  Preparing Food and eating ? N  Using the Toilet? N  In the past six months, have you accidently leaked urine? N  Do you have problems with loss of bowel control? N  Managing your Medications? N  Managing your Finances? N  Housekeeping or managing your Housekeeping? N    Patient Care Team: Pincus Sanes, MD as PCP - General (Internal Medicine) Hillis Range, MD (Inactive) as PCP - Cardiology (Cardiology) Davina Poke as Consulting Physician (Optometry) Pollock, Garry Heater, [facility] as Pharmacist (Pharmacist)  Indicate any recent Medical Services you may have received from other than [facility] providers in the past year (date may be approximate).     Assessment:   This is a routine wellness examination for Marquelle.  Hearing/Vision screen Hearing Screening - Comments:: Denies hearing difficulties   Vision Screening - Comments:: Wears rx glasses  for reading    up to date with routine eye exams with Dr. Shea Evans for glaucoma in [place] on 05/01/22   Dietary issues and exercise activities discussed:     Goals Addressed  This Visit's Progress    Patient Stated   On track    Lose weight by monitoring my diet and eating more veg. Increase my activity by starting to walk again.        Depression Screen    05/02/2022   12:54 PM 09/28/2021    2:25 PM  12/29/2020   11:35 AM 11/03/2019   10:02 AM 09/04/2018   11:07 AM 09/04/2018    9:28 AM 06/26/2017   10:25 AM  PHQ 2/9 Scores  PHQ - 2 Score 0 0 0 0 1 0 0  PHQ- 9 Score       1    Fall Risk    05/02/2022   10:41 AM 04/29/2022   11:26 AM 09/28/2021    2:25 PM 12/29/2020   11:24 AM 11/03/2019   10:04 AM  Fall Risk   Falls in the past year? 0 0 0 0 0  Number falls in past yr: 0  0 0 0  Injury with Fall? 0 0 0 0 0  Risk for fall due to :   No Fall Risks No Fall Risks No Fall Risks  Follow up   Falls evaluation completed Falls prevention discussed Falls evaluation completed    FALL RISK PREVENTION PERTAINING TO THE HOME:  Any stairs in or around the home? Yes  If so, are there any without handrails? No  Home free of loose throw rugs in walkways, pet beds, electrical cords, etc? Yes  Adequate lighting in your home to reduce risk of falls? Yes   ASSISTIVE DEVICES UTILIZED TO PREVENT FALLS:  Life alert? No  Use of a cane, walker or w/c? No  Grab bars in the bathroom? No  Shower chair or bench in shower? No  Elevated toilet seat or a handicapped toilet? No   TIMED UP AND GO:  Was the test performed? No .   televisit         05/02/2022   10:50 AM  6CIT Screen  What Year? 0 points  What month? 0 points  What time? 0 points  Count back from 20 0 points  Months in reverse 0 points  Repeat phrase 0 points  Total Score 0 points    Immunizations Immunization History  Administered Date(s) Administered   Fluad Quad(high Dose 65+) 09/02/2018, 09/22/2021   Hepatitis A 03/27/2000, 10/16/2000   Influenza Whole 10/17/2011   Influenza, High Dose Seasonal PF 09/28/2015, 10/13/2016, 09/28/2017   Influenza,inj,Quad PF,6+ Mos 10/16/2012, 09/29/2014, 10/13/2016   Influenza-Unspecified 10/30/2013, 09/02/2018, 09/25/2019   PFIZER(Purple Top)SARS-COV-2 Vaccination 02/10/2019, 03/03/2019   Pneumococcal Conjugate-13 02/03/2015   Pneumococcal Polysaccharide-23 04/11/2013   Tdap  09/21/2006, 03/18/2021   Typhoid Inactivated 03/27/2000   Typhoid Live 10/19/2003   Zoster, Live 01/05/2010    TDAP status: Up to date  Flu Vaccine status: Up to date  Pneumococcal vaccine status: Up to date  Covid-19 vaccine status: Completed vaccines  Qualifies for Shingles Vaccine? Yes   Zostavax completed Yes   Shingrix Completed?: No.    Education has been provided regarding the importance of this vaccine. Patient has been advised to call insurance company to determine out of pocket expense if they have not yet received this vaccine. Advised may also receive vaccine at local pharmacy or Health Dept. Verbalized acceptance and understanding.  Screening Tests Health Maintenance  Topic Date Due   Zoster Vaccines- Shingrix (1 of 2) Never done   INFLUENZA VACCINE  08/17/2022   COLONOSCOPY (Pts 45-52yrs Insurance coverage will need  to be confirmed)  09/04/2022   Diabetic kidney evaluation - Urine ACR  09/29/2022   HEMOGLOBIN A1C  10/13/2022   FOOT EXAM  11/02/2022   OPHTHALMOLOGY EXAM  01/17/2023   Diabetic kidney evaluation - eGFR measurement  03/29/2023   Medicare Annual Wellness (AWV)  05/02/2023   DTaP/Tdap/Td (3 - Td or Tdap) 03/19/2031   Pneumonia Vaccine 11+ Years old  Completed   Hepatitis C Screening  Completed   HPV VACCINES  Aged Out   COVID-19 Vaccine  Discontinued    Health Maintenance  Health Maintenance Due  Topic Date Due   Zoster Vaccines- Shingrix (1 of 2) Never done    Colorectal cancer screening: Type of screening: Colonoscopy. Completed 09/03/2017. Repeat every 5 years  Lung Cancer Screening: (Low Dose CT Chest recommended if Age 54-80 years, 30 pack-year currently smoking OR have quit w/in 15years.) does not qualify.   Lung Cancer Screening Referral: N/A  Additional Screening:  Hepatitis C Screening: does qualify; Completed 09/28/2015  Vision Screening: Recommended annual ophthalmology exams for early detection of glaucoma and other disorders  of the eye. Is the patient up to date with their annual eye exam?  Yes  Who is the provider or what is the name of the office in which the patient attends annual eye exams?  Dr. Shea Evans If pt is not established with a provider, would they like to be referred to a provider to establish care? No .   Dental Screening: Recommended annual dental exams for proper oral hygiene  Community Resource Referral / Chronic Care Management: CRR required this visit?  No   CCM required this visit?  No      Plan:     I have personally reviewed and noted the following in the patient's chart:   Medical and social history Use of alcohol, tobacco or illicit drugs  Current medications and supplements including opioid prescriptions. Patient is not currently taking opioid prescriptions. Functional ability and status Nutritional status Physical activity Advanced directives List of other physicians Hospitalizations, surgeries, and ER visits in previous 12 months Vitals Screenings to include cognitive, depression, and falls Referrals and appointments  In addition, I have reviewed and discussed with patient certain preventive protocols, quality metrics, and best practice recommendations. A written personalized care plan for preventive services as well as general preventive health recommendations were provided to patient.     Milus Mallick, CMA   05/02/2022   Nurse Notes:  Discussed importance of obtain vaccines

## 2022-06-03 ENCOUNTER — Encounter: Payer: Self-pay | Admitting: Internal Medicine

## 2022-06-04 ENCOUNTER — Other Ambulatory Visit: Payer: Self-pay

## 2022-06-04 DIAGNOSIS — R739 Hyperglycemia, unspecified: Secondary | ICD-10-CM

## 2022-06-04 MED ORDER — GLIPIZIDE 5 MG PO TABS: 5.0000 mg | ORAL_TABLET | Freq: Two times a day (BID) | ORAL | 1 refills | Status: DC

## 2022-06-06 DIAGNOSIS — Z85828 Personal history of other malignant neoplasm of skin: Secondary | ICD-10-CM | POA: Diagnosis not present

## 2022-06-06 DIAGNOSIS — D225 Melanocytic nevi of trunk: Secondary | ICD-10-CM | POA: Diagnosis not present

## 2022-06-06 DIAGNOSIS — Z1283 Encounter for screening for malignant neoplasm of skin: Secondary | ICD-10-CM | POA: Diagnosis not present

## 2022-06-06 DIAGNOSIS — Z08 Encounter for follow-up examination after completed treatment for malignant neoplasm: Secondary | ICD-10-CM | POA: Diagnosis not present

## 2022-06-06 DIAGNOSIS — D2262 Melanocytic nevi of left upper limb, including shoulder: Secondary | ICD-10-CM | POA: Diagnosis not present

## 2022-06-06 DIAGNOSIS — D485 Neoplasm of uncertain behavior of skin: Secondary | ICD-10-CM | POA: Diagnosis not present

## 2022-06-06 DIAGNOSIS — L814 Other melanin hyperpigmentation: Secondary | ICD-10-CM | POA: Diagnosis not present

## 2022-06-16 ENCOUNTER — Encounter: Payer: Self-pay | Admitting: Internal Medicine

## 2022-06-19 ENCOUNTER — Encounter: Payer: Self-pay | Admitting: Internal Medicine

## 2022-06-19 ENCOUNTER — Other Ambulatory Visit: Payer: Self-pay

## 2022-06-19 DIAGNOSIS — E119 Type 2 diabetes mellitus without complications: Secondary | ICD-10-CM

## 2022-06-19 MED ORDER — EMPAGLIFLOZIN 25 MG PO TABS
25.0000 mg | ORAL_TABLET | Freq: Every day | ORAL | 3 refills | Status: DC
Start: 2022-06-19 — End: 2023-01-23

## 2022-06-20 NOTE — Telephone Encounter (Signed)
[name] pt response.Marland KitchenRaechel Chute

## 2022-06-22 ENCOUNTER — Other Ambulatory Visit: Payer: Self-pay

## 2022-06-22 ENCOUNTER — Telehealth: Payer: Self-pay | Admitting: Internal Medicine

## 2022-06-22 MED ORDER — RIVAROXABAN 20 MG PO TABS: ORAL_TABLET | ORAL | 3 refills | Status: DC

## 2022-06-22 NOTE — Telephone Encounter (Signed)
Prescription Request  06/22/2022  LOV: 03/29/2022  What is the name of the medication or equipment? zarelto  Have you contacted your pharmacy to request a refill? Yes   Which pharmacy would you like this sent to? [facility]  Fax:  [phone]    Patient notified that their request is being sent to the clinical staff for review and that they should receive a response within 2 business days.   Please advise at Mobile [phone] (mobile)

## 2022-06-22 NOTE — Telephone Encounter (Signed)
Script faxed in today as requested.

## 2022-08-23 DIAGNOSIS — D485 Neoplasm of uncertain behavior of skin: Secondary | ICD-10-CM | POA: Diagnosis not present

## 2022-08-23 DIAGNOSIS — Z1283 Encounter for screening for malignant neoplasm of skin: Secondary | ICD-10-CM | POA: Diagnosis not present

## 2022-08-23 DIAGNOSIS — D225 Melanocytic nevi of trunk: Secondary | ICD-10-CM | POA: Diagnosis not present

## 2022-08-31 ENCOUNTER — Encounter (INDEPENDENT_AMBULATORY_CARE_PROVIDER_SITE_OTHER): Payer: Self-pay

## 2022-09-05 DIAGNOSIS — Z7984 Long term (current) use of oral hypoglycemic drugs: Secondary | ICD-10-CM | POA: Diagnosis not present

## 2022-09-05 DIAGNOSIS — Z79899 Other long term (current) drug therapy: Secondary | ICD-10-CM | POA: Diagnosis not present

## 2022-09-05 DIAGNOSIS — L509 Urticaria, unspecified: Secondary | ICD-10-CM | POA: Diagnosis not present

## 2022-09-05 DIAGNOSIS — Z79890 Hormone replacement therapy (postmenopausal): Secondary | ICD-10-CM | POA: Diagnosis not present

## 2022-09-13 DIAGNOSIS — Z23 Encounter for immunization: Secondary | ICD-10-CM | POA: Diagnosis not present

## 2022-09-20 ENCOUNTER — Ambulatory Visit: Payer: Medicare Other | Admitting: Internal Medicine

## 2022-09-21 DIAGNOSIS — H401111 Primary open-angle glaucoma, right eye, mild stage: Secondary | ICD-10-CM | POA: Diagnosis not present

## 2022-10-09 ENCOUNTER — Other Ambulatory Visit: Payer: Self-pay | Admitting: Internal Medicine

## 2022-10-09 DIAGNOSIS — I10 Essential (primary) hypertension: Secondary | ICD-10-CM

## 2022-10-11 ENCOUNTER — Encounter: Payer: Self-pay | Admitting: Internal Medicine

## 2022-10-11 NOTE — Progress Notes (Unsigned)
Subjective:    Patient ID: [name], male    DOB: 1945/09/04, 77 y.o.   MRN: [id number]     HPI [name] is here for follow up of his chronic medical problems.  Hardly eats.  Sometimes when he eats he gets grumbling lower in his his abdomen and occasionally a stomach ache.  One day at the beach he had diarrhea and had hives on the right side of his chest and abdomen. Went to urgent care and was prescribed steroids.  Since then not having a lot of stomach aches.    When traveling he gets constipated.    Medications and allergies reviewed with patient and updated if appropriate.  Current Outpatient Medications on File Prior to Visit  Medication Sig Dispense Refill   amLODipine (NORVASC) 10 MG tablet Take 1 tablet (10 mg total) by mouth daily. 90 tablet 3   atorvastatin (LIPITOR) 10 MG tablet Take 1 tablet (10 mg total) by mouth daily. 90 tablet 3   augmented betamethasone dipropionate (DIPROLENE-AF) 0.05 % cream Apply topically.     brimonidine (ALPHAGAN) 0.2 % ophthalmic solution 1 drop 2 (two) times daily.     dorzolamide (TRUSOPT) 2 % ophthalmic solution SMARTSIG:In Eye(s)     empagliflozin (JARDIANCE) 25 MG TABS tablet Take 1 tablet (25 mg total) by mouth daily before breakfast. 90 tablet 3   finasteride (PROPECIA) 1 MG tablet Take 0.25 mg by mouth daily.     glipiZIDE (GLUCOTROL) 5 MG tablet Take 1 tablet (5 mg total) by mouth 2 (two) times daily before a meal. 180 tablet 1   Latanoprostene Bunod (VYZULTA) 0.024 % SOLN Place 1 drop into both eyes daily.     levothyroxine (SYNTHROID) 88 MCG tablet Take 1 tablet (88 mcg total) by mouth daily. 90 tablet 3   lisinopril (ZESTRIL) 2.5 MG tablet TAKE 1 TABLET DAILY 90 tablet 3   Magnesium Oxide 500 MG CAPS Take 1 capsule by mouth 2 (two) times daily.     metFORMIN (GLUCOPHAGE-XR) 500 MG 24 hr tablet TAKE 2 TABLETS TWICE A DAY 360 tablet 3   metoprolol succinate (TOPROL-XL) 100 MG 24 hr tablet Take 2.5 tablets (250 mg  total) by mouth daily. Take an extra 100 mg daily prn for palpitations.   Take with or immediately following a meal. 250 tablet 3   rivaroxaban (XARELTO) 20 MG TABS tablet TAKE 1 TABLET DAILY WITH SUPPER 90 tablet 3   Semaglutide (RYBELSUS) 14 MG TABS Take 1 tablet (14 mg total) by mouth daily. 90 tablet 3   tamsulosin (FLOMAX) 0.4 MG CAPS capsule Take 0.4 mg by mouth daily.     timolol (TIMOPTIC) 0.5 % ophthalmic solution 1 drop every morning.     No current facility-administered medications on file prior to visit.     Review of Systems  Constitutional:  Negative for fever.  Respiratory:  Negative for cough, shortness of breath and wheezing.   Cardiovascular:  Negative for chest pain, palpitations and leg swelling.  Gastrointestinal:  Positive for constipation (mild).  Neurological:  Negative for light-headedness and headaches.       Objective:   Vitals:   10/12/22 1009  BP: 114/78  Pulse: 69  Temp: 98.2 F (36.8 C)  SpO2: 98%   BP Readings from Last 3 Encounters:  10/12/22 114/78  04/12/22 100/66  03/29/22 128/70   Wt Readings from Last 3 Encounters:  10/12/22 171 lb (77.6 kg)  05/02/22 167 lb (75.8 kg)  04/12/22  171 lb (77.6 kg)   Body mass index is 26 kg/m.    Physical Exam Constitutional:      General: He is not in acute distress.    Appearance: Normal appearance. He is not ill-appearing.  HENT:     Head: Normocephalic and atraumatic.  Eyes:     Conjunctiva/sclera: Conjunctivae normal.  Cardiovascular:     Rate and Rhythm: Normal rate and regular rhythm.     Heart sounds: Normal heart sounds.  Pulmonary:     Effort: Pulmonary effort is normal. No respiratory distress.     Breath sounds: Normal breath sounds. No wheezing or rales.  Musculoskeletal:     Right lower leg: No edema.     Left lower leg: No edema.  Skin:    General: Skin is warm and dry.     Findings: No rash.  Neurological:     Mental Status: He is alert. Mental status is at baseline.   Psychiatric:        Mood and Affect: Mood normal.        Lab Results  Component Value Date   WBC 6.1 03/29/2022   HGB 17.3 (H) 03/29/2022   HCT 51.3 03/29/2022   PLT 213.0 03/29/2022   GLUCOSE 169 (H) 03/29/2022   CHOL 118 03/29/2022   TRIG 106.0 03/29/2022   HDL 62.20 03/29/2022   LDLDIRECT 91.0 08/23/2016   LDLCALC 35 03/29/2022   ALT 16 03/29/2022   AST 16 03/29/2022   NA 141 03/29/2022   K 4.7 03/29/2022   CL 103 03/29/2022   CREATININE 0.97 03/29/2022   BUN 17 03/29/2022   CO2 30 03/29/2022   TSH 1.37 03/29/2022   PSA 0.76 04/25/2007   INR 2.6 09/26/2010   HGBA1C 6.5 (A) 04/12/2022   MICROALBUR 4.0 (H) 09/28/2021     Assessment & Plan:    See Problem List for Assessment and Plan of chronic medical problems.

## 2022-10-11 NOTE — Patient Instructions (Addendum)
      Blood work was ordered.   The lab is on the first floor.    Medications changes include :   none      Return in about 6 months (around 04/11/2023) for follow up.

## 2022-10-12 ENCOUNTER — Ambulatory Visit (INDEPENDENT_AMBULATORY_CARE_PROVIDER_SITE_OTHER): Payer: Medicare Other | Admitting: Internal Medicine

## 2022-10-12 VITALS — BP 114/78 | HR 69 | Temp 98.2°F | Ht 68.0 in | Wt 171.0 lb

## 2022-10-12 DIAGNOSIS — I4819 Other persistent atrial fibrillation: Secondary | ICD-10-CM

## 2022-10-12 DIAGNOSIS — I10 Essential (primary) hypertension: Secondary | ICD-10-CM

## 2022-10-12 DIAGNOSIS — E039 Hypothyroidism, unspecified: Secondary | ICD-10-CM

## 2022-10-12 DIAGNOSIS — Z7984 Long term (current) use of oral hypoglycemic drugs: Secondary | ICD-10-CM

## 2022-10-12 DIAGNOSIS — G479 Sleep disorder, unspecified: Secondary | ICD-10-CM | POA: Diagnosis not present

## 2022-10-12 DIAGNOSIS — E7849 Other hyperlipidemia: Secondary | ICD-10-CM

## 2022-10-12 DIAGNOSIS — E1165 Type 2 diabetes mellitus with hyperglycemia: Secondary | ICD-10-CM

## 2022-10-12 DIAGNOSIS — E1151 Type 2 diabetes mellitus with diabetic peripheral angiopathy without gangrene: Secondary | ICD-10-CM | POA: Diagnosis not present

## 2022-10-12 DIAGNOSIS — E119 Type 2 diabetes mellitus without complications: Secondary | ICD-10-CM

## 2022-10-12 LAB — CBC WITH DIFFERENTIAL/PLATELET
Basophils Absolute: 0.1 10*3/uL (ref 0.0–0.1)
Basophils Relative: 1.2 % (ref 0.0–3.0)
Eosinophils Absolute: 0.4 10*3/uL (ref 0.0–0.7)
Eosinophils Relative: 6.4 % — ABNORMAL HIGH (ref 0.0–5.0)
HCT: 49.2 % (ref 39.0–52.0)
Hemoglobin: 16 g/dL (ref 13.0–17.0)
Lymphocytes Relative: 21.9 % (ref 12.0–46.0)
Lymphs Abs: 1.5 10*3/uL (ref 0.7–4.0)
MCHC: 32.5 g/dL (ref 30.0–36.0)
MCV: 90.1 fl (ref 78.0–100.0)
Monocytes Absolute: 0.8 10*3/uL (ref 0.1–1.0)
Monocytes Relative: 11.4 % (ref 3.0–12.0)
Neutro Abs: 4.2 10*3/uL (ref 1.4–7.7)
Neutrophils Relative %: 59.1 % (ref 43.0–77.0)
Platelets: 223 10*3/uL (ref 150.0–400.0)
RBC: 5.46 Mil/uL (ref 4.22–5.81)
RDW: 15.6 % — ABNORMAL HIGH (ref 11.5–15.5)
WBC: 7.1 10*3/uL (ref 4.0–10.5)

## 2022-10-12 LAB — LIPID PANEL
Cholesterol: 121 mg/dL (ref 0–200)
HDL: 58.2 mg/dL (ref >39.00)
LDL Cholesterol: 45 mg/dL (ref 0–99)
NonHDL: 63.03
Total CHOL/HDL Ratio: 2
Triglycerides: 88 mg/dL (ref 0.0–149.0)
VLDL: 17.6 mg/dL (ref 0.0–40.0)

## 2022-10-12 LAB — MICROALBUMIN / CREATININE URINE RATIO
Creatinine,U: 71.8 mg/dL
Microalb Creat Ratio: 5.9 mg/g (ref 0.0–30.0)
Microalb, Ur: 4.2 mg/dL — ABNORMAL HIGH (ref 0.0–1.9)

## 2022-10-12 LAB — COMPREHENSIVE METABOLIC PANEL
ALT: 19 U/L (ref 0–53)
AST: 17 U/L (ref 0–37)
Albumin: 4.4 g/dL (ref 3.5–5.2)
Alkaline Phosphatase: 52 U/L (ref 39–117)
BUN: 15 mg/dL (ref 6–23)
CO2: 27 mEq/L (ref 19–32)
Calcium: 9.9 mg/dL (ref 8.4–10.5)
Chloride: 103 mEq/L (ref 96–112)
Creatinine, Ser: 0.97 mg/dL (ref 0.40–1.50)
GFR: 76.11 mL/min (ref >60.00)
Glucose, Bld: 135 mg/dL — ABNORMAL HIGH (ref 70–99)
Potassium: 4.8 mEq/L (ref 3.5–5.1)
Sodium: 139 mEq/L (ref 135–145)
Total Bilirubin: 0.6 mg/dL (ref 0.2–1.2)
Total Protein: 7.5 g/dL (ref 6.0–8.3)

## 2022-10-12 LAB — TSH: TSH: 3.5 u[IU]/mL (ref 0.35–5.50)

## 2022-10-12 MED ORDER — RIVAROXABAN 20 MG PO TABS: ORAL_TABLET | ORAL | 2 refills | Status: DC

## 2022-10-12 MED ORDER — ATORVASTATIN CALCIUM 10 MG PO TABS: 10.0000 mg | ORAL_TABLET | Freq: Every day | ORAL | 3 refills | Status: DC

## 2022-10-12 MED ORDER — AMLODIPINE BESYLATE 10 MG PO TABS: 10.0000 mg | ORAL_TABLET | Freq: Every day | ORAL | 3 refills | Status: DC

## 2022-10-12 MED ORDER — LEVOTHYROXINE SODIUM 88 MCG PO TABS
88.0000 ug | ORAL_TABLET | Freq: Every day | ORAL | 3 refills | Status: DC
Start: 2022-10-12 — End: 2023-04-11

## 2022-10-12 NOTE — Assessment & Plan Note (Signed)
Chronic Blood pressure well controlled CMP, cbc Continue amlodipine 10 mg daily, lisinopril 2.5 mg daily, metoprolol XL 250 mg daily

## 2022-10-12 NOTE — Assessment & Plan Note (Signed)
Chronic  Clinically euthyroid Check tsh and will titrate med dose if needed Currently taking levothyroxine 88 mcg daily

## 2022-10-12 NOTE — Assessment & Plan Note (Signed)
Chronic Not taking any medication

## 2022-10-12 NOTE — Assessment & Plan Note (Addendum)
Chronic   Lab Results  Component Value Date   HGBA1C 6.5 (A) 04/12/2022   Sugars controlled Management per endocrine Check urine microalbumin Continue Jardiance, glipizide, metformin, Rybelsus Rybelsus is causing some abdominal symptoms when he overeats or eats the wrong foods - discussed this is related to the medication and how it works Stressed regular exercise, diabetic diet

## 2022-10-12 NOTE — Assessment & Plan Note (Signed)
Chronic Regular exercise and healthy diet encouraged Check lipid panel, cmp Continue atorvastatin 10 mg daily

## 2022-10-12 NOTE — Assessment & Plan Note (Signed)
Chronic Following with cardiology On Xarelto 20 mg daily, metoprolol XL 250 mg daily CBC, CMP

## 2022-10-18 LAB — TESTOSTERONE, FREE & TOTAL
Free Testosterone: 56.8 pg/mL (ref 30.0–135.0)
Testosterone, Total, LC-MS-MS: 345 ng/dL (ref 250–1100)

## 2022-10-22 ENCOUNTER — Encounter: Payer: Self-pay | Admitting: Internal Medicine

## 2022-10-22 DIAGNOSIS — E1151 Type 2 diabetes mellitus with diabetic peripheral angiopathy without gangrene: Secondary | ICD-10-CM

## 2022-10-23 DIAGNOSIS — N401 Enlarged prostate with lower urinary tract symptoms: Secondary | ICD-10-CM | POA: Diagnosis not present

## 2022-10-24 LAB — HEMOGLOBIN A1C: Hgb A1c MFr Bld: 6.9 % — ABNORMAL HIGH (ref 4.6–6.5)

## 2022-10-25 ENCOUNTER — Encounter: Payer: Self-pay | Admitting: Internal Medicine

## 2022-10-30 DIAGNOSIS — N200 Calculus of kidney: Secondary | ICD-10-CM | POA: Diagnosis not present

## 2022-10-30 DIAGNOSIS — N401 Enlarged prostate with lower urinary tract symptoms: Secondary | ICD-10-CM | POA: Diagnosis not present

## 2022-10-30 DIAGNOSIS — R351 Nocturia: Secondary | ICD-10-CM | POA: Diagnosis not present

## 2022-11-12 ENCOUNTER — Encounter: Payer: Self-pay | Admitting: Internal Medicine

## 2022-12-04 ENCOUNTER — Encounter: Payer: Self-pay | Admitting: Internal Medicine

## 2022-12-04 ENCOUNTER — Ambulatory Visit: Payer: Medicare Other | Admitting: Podiatry

## 2022-12-06 ENCOUNTER — Encounter: Payer: Self-pay | Admitting: Podiatry

## 2022-12-06 ENCOUNTER — Ambulatory Visit (INDEPENDENT_AMBULATORY_CARE_PROVIDER_SITE_OTHER): Payer: Medicare Other | Admitting: Podiatry

## 2022-12-06 DIAGNOSIS — E1165 Type 2 diabetes mellitus with hyperglycemia: Secondary | ICD-10-CM

## 2022-12-06 NOTE — Progress Notes (Signed)
Subjective:   Patient ID: [name], male   DOB: 77 y.o.   MRN: [id number]   HPI Patient presents concerned about diabetes and the possibility for nail disease bilateral.  Patient does not smoke likes to be active   Review of Systems  All other systems reviewed and are negative.       Objective:  Physical Exam Vitals and nursing note reviewed.  Constitutional:      Appearance: He is well-developed.  Pulmonary:     Effort: Pulmonary effort is normal.  Musculoskeletal:        General: Normal range of motion.  Skin:    General: Skin is warm.  Neurological:     Mental Status: He is alert.     Neurovascular status intact muscle strength adequate range of motion adequate with patient's diabetes under good control and good range of motion with no indications of structural pathology     Assessment:  Status good on long-term diabetes with no current pathology and excellent control of sugar     Plan:  H&P reviewed recommended the continuation of conservative care and debridement technique.  Patient will be seen back as needed diabetic education rendered

## 2022-12-11 ENCOUNTER — Other Ambulatory Visit: Payer: Self-pay | Admitting: Internal Medicine

## 2022-12-11 DIAGNOSIS — R739 Hyperglycemia, unspecified: Secondary | ICD-10-CM

## 2022-12-13 ENCOUNTER — Encounter: Payer: Self-pay | Admitting: Internal Medicine

## 2022-12-13 ENCOUNTER — Other Ambulatory Visit: Payer: Self-pay

## 2022-12-13 ENCOUNTER — Ambulatory Visit: Payer: Medicare Other | Attending: Internal Medicine | Admitting: Internal Medicine

## 2022-12-13 VITALS — BP 118/70 | HR 74 | Ht 68.0 in | Wt 169.4 lb

## 2022-12-13 DIAGNOSIS — R739 Hyperglycemia, unspecified: Secondary | ICD-10-CM

## 2022-12-13 DIAGNOSIS — I4891 Unspecified atrial fibrillation: Secondary | ICD-10-CM | POA: Diagnosis not present

## 2022-12-13 MED ORDER — GLIPIZIDE 5 MG PO TABS: 5.0000 mg | ORAL_TABLET | Freq: Two times a day (BID) | ORAL | 1 refills | Status: DC

## 2022-12-13 MED ORDER — APIXABAN 5 MG PO TABS: 5.0000 mg | ORAL_TABLET | Freq: Two times a day (BID) | ORAL | 1 refills | Status: DC

## 2022-12-13 NOTE — Patient Instructions (Signed)
Medication Instructions:  Your physician has recommended you make the following change in your medication:   ** Stop Xarelto  ** Begin 5mg  - 1 tablet by mouth twice daily  *If you need a refill on your cardiac medications before your next appointment, please call your pharmacy*   Lab Work: None ordered.  If you have labs (blood work) drawn today and your tests are completely normal, you will receive your results only by: MyChart Message (if you have MyChart) OR A paper copy in the mail If you have any lab test that is abnormal or we need to change your treatment, we will call you to review the results.   Testing/Procedures: None ordered.    Follow-Up: At [facility], you and your health needs are our priority.  As part of our continuing mission to provide you with exceptional heart care, we have created designated Provider Care Teams.  These Care Teams include your primary Cardiologist (physician) and Advanced Practice Providers (APPs -  Physician Assistants and Nurse Practitioners) who all work together to provide you with the care you need, when you need it.  We recommend signing up for the patient portal called "MyChart".  Sign up information is provided on this After Visit Summary.  MyChart is used to connect with patients for Virtual Visits (Telemedicine).  Patients are able to view lab/test results, encounter notes, upcoming appointments, etc.  Non-urgent messages can be sent to your provider as well.   To learn more about what you can do with MyChart, go to [email or web address].    Your next appointment:   12 months with Dr [name]

## 2022-12-13 NOTE — Progress Notes (Signed)
Patient Care Team: Pincus Sanes, MD as PCP - General (Internal Medicine) Hillis Range, MD (Inactive) as PCP - Cardiology (Cardiology) Davina Poke as Consulting Physician (Optometry) [place], Garry Heater, [place] (Inactive) as Pharmacist (Pharmacist)   HPI  [name] is a 77 y.o. male s/p Ablation x 2   and convergent at [facility] 2012; anticoagulation with Rivaroxaban, Hx of previous reveal [employer] DOI 2012, no interval significant  atrial fibrillation which  he is aware; no bleeding The patient denies chest pain, shortness of breath, nocturnal dyspnea, orthopnea or peripheral edema.  There have been no palpitations, lightheadedness or syncope.   Has lost weight and treatment for his diabetes.  Remains not very active.  Traveling a lot.  No chest pain shortness of breath or peripheral edema   Date Cr K Hgb  3/23 0.92 4.3 16.7   9/24 0.97 4.8 16.0   DATE TEST EF   2/12 Echo   60-65 %              Thromboembolic risk factors ( age -66, HTN-1,   DM-1, Vasc disease -1) for a CHADSVASc Score of 4  Records and Results Reviewed   Past Medical History:  Diagnosis Date   AF (atrial fibrillation) (HCC)    persistent afib, s/p afib ablation x 2 with subsequent convergent procedure at [facility]   Allergic rhinitis    seasonal   Atrial flutter (HCC)    CTS (carpal tunnel syndrome)    PMH of   Diabetes mellitus    type 2   External hemorrhoids    Glaucoma    Hyperlipidemia    Hypertension    Hypothyroidism    Renal calculus 2005   [facility]    Past Surgical History:  Procedure Laterality Date   ABLATION  06/16/2013   atypical atrial flutter ablation by Dr Hurman Horn at [facility] (mitral annular flutter ablated)   afib 09/22/2008--11/03/2009     afib ablation x 2 by Dr Johney Frame with subsequent convergent procedure at [facility]   CARDIAC CATHETERIZATION  01/17/2003   non obstructive CAD   CARDIOVERSION     CARDIOVERSION  12/09/2010   Procedure: CARDIOVERSION;  Surgeon: Marca Ancona, MD;  Location: [facility] OR;  Service: Cardiovascular;  Laterality: N/A;   CARDIOVERSION  09/15/2011   Procedure: CARDIOVERSION;  Surgeon: Wendall Stade, MD;  Location: [facility] ENDOSCOPY;  Service: Cardiovascular;  Laterality: N/A;  office drawing labs wed 8/28   CARDIOVERSION N/A 07/18/2012   Procedure: CARDIOVERSION;  Surgeon: Vesta Mixer, MD;  Location: [facility] ENDOSCOPY;  Service: Cardiovascular;  Laterality: N/A;   CARDIOVERSION  08/17/2014   [place]   COLONOSCOPY  01/17/2007   Dr Jarold Motto   convergent procedure  01/17/2011   at [facility] for a fib   JOINT REPLACEMENT Right 2008, 2012   LOOP RECORDER IMPLANT Left 12/17/2010   SQUAMOUS CELL CARCINOMA EXCISION     TONSILLECTOMY     TOTAL HIP ARTHROPLASTY  06/17/2010   Depuy prosthesis replacement, Dr Charlann Boxer   TOTAL HIP ARTHROPLASTY Right 01/16/2006    Dep[facility];Dr Charlann Boxer    Current Meds  Medication Sig   amLODipine (NORVASC) 10 MG tablet Take 1 tablet (10 mg total) by mouth daily.   atorvastatin (LIPITOR) 10 MG tablet Take 1 tablet (10 mg total) by mouth daily.   augmented betamethasone dipropionate (DIPROLENE-AF) 0.05 % cream Apply topically.   brimonidine (ALPHAGAN) 0.2 % ophthalmic solution 1 drop 2 (two) times daily.   dorzolamide (  TRUSOPT) 2 % ophthalmic solution SMARTSIG:In Eye(s)   empagliflozin (JARDIANCE) 25 MG TABS tablet Take 1 tablet (25 mg total) by mouth daily before breakfast.   finasteride (PROPECIA) 1 MG tablet Take 0.25 mg by mouth daily.   glipiZIDE (GLUCOTROL) 5 MG tablet Take 1 tablet (5 mg total) by mouth 2 (two) times daily before a meal.   Latanoprostene Bunod (VYZULTA) 0.024 % SOLN Place 1 drop into both eyes daily.   levothyroxine (SYNTHROID) 88 MCG tablet Take 1 tablet (88 mcg total) by mouth daily.   lisinopril (ZESTRIL) 2.5 MG tablet TAKE 1 TABLET DAILY   Magnesium Oxide 500 MG CAPS Take 1 capsule by mouth 2 (two) times daily.   metFORMIN (GLUCOPHAGE-XR) 500 MG 24 hr tablet TAKE 2 TABLETS TWICE A DAY    metoprolol succinate (TOPROL-XL) 100 MG 24 hr tablet Take 2.5 tablets (250 mg total) by mouth daily. Take an extra 100 mg daily prn for palpitations.   Take with or immediately following a meal.   rivaroxaban (XARELTO) 20 MG TABS tablet TAKE 1 TABLET DAILY WITH SUPPERTAKE 1 TABLET DAILY WITH SUPPER   Semaglutide (RYBELSUS) 14 MG TABS Take 1 tablet (14 mg total) by mouth daily.   tamsulosin (FLOMAX) 0.4 MG CAPS capsule Take 0.4 mg by mouth daily.   timolol (TIMOPTIC) 0.5 % ophthalmic solution 1 drop every morning.    Allergies  Allergen Reactions   Farxiga [Dapagliflozin] Other (See Comments)    Dry mouth, increased urination, increased appetite      Review of Systems negative except from HPI and PMH  Physical Exam BP 118/70   Pulse 74   Ht 5\' 8"  (1.727 m)   Wt 169 lb 6.4 oz (76.8 kg)   SpO2 96%   BMI 25.76 kg/m  Well developed and nourished in no acute distress HENT normal Neck supple with JVP-  flat   Clear Regular rate and rhythm, no murmurs or gallops Abd-soft with active BS No Clubbing cyanosis edema Skin-warm and dry A & Oriented  Grossly normal sensory and motor function  ECG sinus at 74 Intervals 20/11/39 Right bundle branch block  CrCl cannot be calculated (Patient's most recent lab result is older than the maximum 21 days allowed.).   Assessment and  Plan  Atrial Fibrillation s/p PVI x 2 and Convergent  HTN  Right bundle branch block  Reveal recorder-abandoned     The patient has had no interval atrial fibrillation.  Raise a question as to whether he could come off of his anticoagulation.  Suggested we can consider explantation of his reveal monitor implantation of a Linq monitor for monitoring.  We will hold off on that as they are traveling now about half time out of the country and so monitoring would be difficult.  We did discuss changing from Xarelto to apixaban for lower risk of intracranial bleeding.  He has lost weight.  Blood pressures are  better.  On amlodipine 10 and lisinopril 2.5 as well as metoprolol 250 mg.  Tolerating.          Current medicines are reviewed at length with the patient today .  The patient does not  have concerns regarding medicines. Is with

## 2022-12-31 ENCOUNTER — Encounter: Payer: Self-pay | Admitting: Internal Medicine

## 2023-01-04 NOTE — Progress Notes (Unsigned)
Subjective:    Patient ID: [name], male    DOB: 1945-07-01, 78 y.o.   MRN: [id number]      HPI Pilot is here for No chief complaint on file.    Intermittent stomach pain - had them a few weeks ago before going out of the country.  He cut his rybelsys in half and the pain subsided.  It happened once before several months ago.  It was lower abdominal cramping - heavy gas pain.  BM were normal.  Lasted one day.  May have taken pepto.    No vomiting.  No nausea or gerd.    Sugars 95-110  Medications and allergies reviewed with patient and updated if appropriate.  Current Outpatient Medications on File Prior to Visit  Medication Sig Dispense Refill   amLODipine (NORVASC) 10 MG tablet Take 1 tablet (10 mg total) by mouth daily. 90 tablet 3   apixaban (ELIQUIS) 5 MG TABS tablet Take 1 tablet (5 mg total) by mouth 2 (two) times daily. 180 tablet 1   atorvastatin (LIPITOR) 10 MG tablet Take 1 tablet (10 mg total) by mouth daily. 90 tablet 3   augmented betamethasone dipropionate (DIPROLENE-AF) 0.05 % cream Apply topically.     brimonidine (ALPHAGAN) 0.2 % ophthalmic solution 1 drop 2 (two) times daily.     dorzolamide (TRUSOPT) 2 % ophthalmic solution SMARTSIG:In Eye(s)     empagliflozin (JARDIANCE) 25 MG TABS tablet Take 1 tablet (25 mg total) by mouth daily before breakfast. 90 tablet 3   finasteride (PROPECIA) 1 MG tablet Take 0.25 mg by mouth daily.     glipiZIDE (GLUCOTROL) 5 MG tablet Take 1 tablet (5 mg total) by mouth 2 (two) times daily before a meal. 180 tablet 1   Latanoprostene Bunod (VYZULTA) 0.024 % SOLN Place 1 drop into both eyes daily.     levothyroxine (SYNTHROID) 88 MCG tablet Take 1 tablet (88 mcg total) by mouth daily. 90 tablet 3   lisinopril (ZESTRIL) 2.5 MG tablet TAKE 1 TABLET DAILY 90 tablet 3   Magnesium Oxide 500 MG CAPS Take 1 capsule by mouth 2 (two) times daily.     metFORMIN (GLUCOPHAGE-XR) 500 MG 24 hr tablet TAKE 2 TABLETS TWICE A DAY 360  tablet 3   metoprolol succinate (TOPROL-XL) 100 MG 24 hr tablet Take 2.5 tablets (250 mg total) by mouth daily. Take an extra 100 mg daily prn for palpitations.   Take with or immediately following a meal. 250 tablet 3   Semaglutide (RYBELSUS) 14 MG TABS Take 1 tablet (14 mg total) by mouth daily. 90 tablet 3   tamsulosin (FLOMAX) 0.4 MG CAPS capsule Take 0.4 mg by mouth daily.     timolol (TIMOPTIC) 0.5 % ophthalmic solution 1 drop every morning.     No current facility-administered medications on file prior to visit.    Review of Systems     Objective:  There were no vitals filed for this visit. BP Readings from Last 3 Encounters:  12/13/22 118/70  10/12/22 114/78  04/12/22 100/66   Wt Readings from Last 3 Encounters:  12/13/22 169 lb 6.4 oz (76.8 kg)  10/12/22 171 lb (77.6 kg)  05/02/22 167 lb (75.8 kg)   There is no height or weight on file to calculate BMI.    Physical Exam         Assessment & Plan:    See Problem List for Assessment and Plan of chronic medical problems.

## 2023-01-05 ENCOUNTER — Ambulatory Visit (INDEPENDENT_AMBULATORY_CARE_PROVIDER_SITE_OTHER): Payer: Medicare Other | Admitting: Internal Medicine

## 2023-01-05 VITALS — BP 120/76 | HR 70 | Temp 98.1°F | Ht 68.0 in | Wt 172.0 lb

## 2023-01-05 DIAGNOSIS — I10 Essential (primary) hypertension: Secondary | ICD-10-CM | POA: Diagnosis not present

## 2023-01-05 DIAGNOSIS — R1030 Lower abdominal pain, unspecified: Secondary | ICD-10-CM

## 2023-01-05 DIAGNOSIS — E119 Type 2 diabetes mellitus without complications: Secondary | ICD-10-CM | POA: Diagnosis not present

## 2023-01-05 DIAGNOSIS — H401111 Primary open-angle glaucoma, right eye, mild stage: Secondary | ICD-10-CM | POA: Diagnosis not present

## 2023-01-05 LAB — HM DIABETES EYE EXAM

## 2023-01-05 MED ORDER — AZITHROMYCIN 250 MG PO TABS: ORAL_TABLET | ORAL | 0 refills | Status: DC

## 2023-01-05 MED ORDER — METHYLPREDNISOLONE 4 MG PO TBPK: ORAL_TABLET | ORAL | 0 refills | Status: DC

## 2023-01-05 NOTE — Patient Instructions (Addendum)
   Medications changes include :   None      Return if symptoms worsen or fail to improve.

## 2023-01-07 DIAGNOSIS — R1031 Right lower quadrant pain: Secondary | ICD-10-CM | POA: Insufficient documentation

## 2023-01-07 DIAGNOSIS — R109 Unspecified abdominal pain: Secondary | ICD-10-CM | POA: Insufficient documentation

## 2023-01-07 NOTE — Assessment & Plan Note (Signed)
Episode of lower abdominal pain a few weeks ago - resolved Cramping, possible gas pain - no change in bowels He took pepto or something and cut his rybelsus  -- symptoms resolved One prior episode No obvious cause but resolved - discussed possible causes - may have been from something he ate, gas pain If occurs again - would treat with pepto if that helped -- transient episode and has only had two episodes so no further work up at this time Due for colonoscopy - will schedule

## 2023-01-07 NOTE — Assessment & Plan Note (Signed)
Chronic Blood pressure well controlled Continue amlodipine 10 mg daily, lisinopril 2.5 mg daily, metoprolol XL 250 mg daily

## 2023-01-08 ENCOUNTER — Ambulatory Visit: Payer: Medicare Other | Admitting: Internal Medicine

## 2023-01-22 ENCOUNTER — Other Ambulatory Visit: Payer: Self-pay | Admitting: Internal Medicine

## 2023-01-22 ENCOUNTER — Encounter: Payer: Self-pay | Admitting: Internal Medicine

## 2023-01-23 ENCOUNTER — Other Ambulatory Visit: Payer: Self-pay

## 2023-01-23 DIAGNOSIS — E119 Type 2 diabetes mellitus without complications: Secondary | ICD-10-CM

## 2023-01-23 MED ORDER — AMLODIPINE BESYLATE 10 MG PO TABS: 10.0000 mg | ORAL_TABLET | Freq: Every day | ORAL | 3 refills | Status: DC

## 2023-01-23 MED ORDER — ATORVASTATIN CALCIUM 10 MG PO TABS: 10.0000 mg | ORAL_TABLET | Freq: Every day | ORAL | 3 refills | Status: DC

## 2023-01-23 MED ORDER — METOPROLOL SUCCINATE ER 100 MG PO TB24: 250.0000 mg | ORAL_TABLET | Freq: Every day | ORAL | 3 refills | Status: DC

## 2023-01-23 MED ORDER — EMPAGLIFLOZIN 25 MG PO TABS: 25.0000 mg | ORAL_TABLET | Freq: Every day | ORAL | 3 refills | Status: DC

## 2023-01-23 MED ORDER — RYBELSUS 14 MG PO TABS: 1.0000 | ORAL_TABLET | Freq: Every day | ORAL | 3 refills | Status: DC

## 2023-01-24 ENCOUNTER — Encounter (INDEPENDENT_AMBULATORY_CARE_PROVIDER_SITE_OTHER): Payer: Medicare Other | Admitting: Ophthalmology

## 2023-01-24 DIAGNOSIS — I10 Essential (primary) hypertension: Secondary | ICD-10-CM

## 2023-01-24 DIAGNOSIS — E113293 Type 2 diabetes mellitus with mild nonproliferative diabetic retinopathy without macular edema, bilateral: Secondary | ICD-10-CM | POA: Diagnosis not present

## 2023-01-24 DIAGNOSIS — H35033 Hypertensive retinopathy, bilateral: Secondary | ICD-10-CM

## 2023-01-24 DIAGNOSIS — H43813 Vitreous degeneration, bilateral: Secondary | ICD-10-CM | POA: Diagnosis not present

## 2023-01-24 DIAGNOSIS — Z7984 Long term (current) use of oral hypoglycemic drugs: Secondary | ICD-10-CM

## 2023-01-29 ENCOUNTER — Other Ambulatory Visit: Payer: Self-pay

## 2023-01-29 MED ORDER — ATORVASTATIN CALCIUM 10 MG PO TABS: 10.0000 mg | ORAL_TABLET | Freq: Every day | ORAL | 3 refills | Status: DC

## 2023-02-13 ENCOUNTER — Ambulatory Visit (INDEPENDENT_AMBULATORY_CARE_PROVIDER_SITE_OTHER): Payer: Medicare Other

## 2023-03-19 ENCOUNTER — Other Ambulatory Visit: Payer: Self-pay | Admitting: Internal Medicine

## 2023-04-02 ENCOUNTER — Encounter: Payer: Self-pay | Admitting: Internal Medicine

## 2023-04-03 DIAGNOSIS — L821 Other seborrheic keratosis: Secondary | ICD-10-CM | POA: Diagnosis not present

## 2023-04-03 DIAGNOSIS — L308 Other specified dermatitis: Secondary | ICD-10-CM | POA: Diagnosis not present

## 2023-04-03 DIAGNOSIS — L820 Inflamed seborrheic keratosis: Secondary | ICD-10-CM | POA: Diagnosis not present

## 2023-04-03 DIAGNOSIS — D485 Neoplasm of uncertain behavior of skin: Secondary | ICD-10-CM | POA: Diagnosis not present

## 2023-04-03 DIAGNOSIS — D225 Melanocytic nevi of trunk: Secondary | ICD-10-CM | POA: Diagnosis not present

## 2023-04-10 NOTE — Patient Instructions (Addendum)
      Blood work was ordered.       Medications changes include :   None    A referral was ordered and someone will call you to schedule an appointment.     Return in about 6 months (around 10/12/2023) for follow up.

## 2023-04-10 NOTE — Progress Notes (Unsigned)
 Subjective:    Patient ID: [name], male    DOB: Feb 26, 1945, 78 y.o.   MRN: [id number]     HPI [name] is here for follow up of his chronic medical problems.  Can wake up and feel a little anxiety - not sure if he just woke up from a dream  - can occur once in a while.  Not sure if it is his heart or just anxiety.  Does not feel afib.  It almost feels like the heart skips a beat.  It something that is very transient.  Has not been active.  Just signed up for another gym.  He is more active in the summer.  He feels he is watched what he has eaten, but is concerned his A1c may be higher.  Medications and allergies reviewed with patient and updated if appropriate.  Current Outpatient Medications on File Prior to Visit  Medication Sig Dispense Refill   amLODipine (NORVASC) 10 MG tablet Take 1 tablet (10 mg total) by mouth daily. 90 tablet 3   apixaban (ELIQUIS) 5 MG TABS tablet Take 1 tablet (5 mg total) by mouth 2 (two) times daily. 180 tablet 1   atorvastatin (LIPITOR) 10 MG tablet Take 1 tablet (10 mg total) by mouth daily. 90 tablet 3   augmented betamethasone dipropionate (DIPROLENE-AF) 0.05 % cream Apply topically.     brimonidine (ALPHAGAN) 0.2 % ophthalmic solution 1 drop 2 (two) times daily.     dorzolamide (TRUSOPT) 2 % ophthalmic solution SMARTSIG:In Eye(s)     empagliflozin (JARDIANCE) 25 MG TABS tablet Take 1 tablet (25 mg total) by mouth daily before breakfast. 90 tablet 3   finasteride (PROPECIA) 1 MG tablet Take 0.25 mg by mouth daily.     glipiZIDE (GLUCOTROL) 5 MG tablet Take 1 tablet (5 mg total) by mouth 2 (two) times daily before a meal. 180 tablet 1   Latanoprostene Bunod (VYZULTA) 0.024 % SOLN Place 1 drop into both eyes daily.     levothyroxine (SYNTHROID) 88 MCG tablet Take 1 tablet (88 mcg total) by mouth daily. 90 tablet 3   lisinopril (ZESTRIL) 2.5 MG tablet TAKE 1 TABLET DAILY 90 tablet 3   Magnesium Oxide 500 MG CAPS Take 1 capsule by mouth  2 (two) times daily.     metFORMIN (GLUCOPHAGE-XR) 500 MG 24 hr tablet TAKE 2 TABLETS TWICE A DAY 120 tablet 0   metoprolol succinate (TOPROL-XL) 100 MG 24 hr tablet Take 2.5 tablets (250 mg total) by mouth daily. Take an extra 100 mg daily prn for palpitations.   Take with or immediately following a meal. 250 tablet 3   Semaglutide (RYBELSUS) 14 MG TABS Take 1 tablet (14 mg total) by mouth daily. TAKE ONE TABLET BY MOUTH ONE TIME DAILY 90 tablet 3   tamsulosin (FLOMAX) 0.4 MG CAPS capsule Take 0.4 mg by mouth daily.     timolol (TIMOPTIC) 0.5 % ophthalmic solution 1 drop every morning.     No current facility-administered medications on file prior to visit.     Review of Systems  Constitutional:  Negative for fever.  Respiratory:  Negative for cough, shortness of breath and wheezing.   Cardiovascular:  Negative for chest pain, palpitations and leg swelling.       Feels a skip in heart occasionally  Neurological:  Negative for light-headedness and headaches.       Objective:   Vitals:   04/11/23 1014  BP: 138/80  Pulse: 76  Temp: 97.8 F (36.6 C)  SpO2: 98%   BP Readings from Last 3 Encounters:  04/11/23 138/80  01/05/23 120/76  12/13/22 118/70   Wt Readings from Last 3 Encounters:  04/11/23 171 lb 6.4 oz (77.7 kg)  01/05/23 172 lb (78 kg)  12/13/22 169 lb 6.4 oz (76.8 kg)   Body mass index is 26.06 kg/m.    Physical Exam Constitutional:      General: He is not in acute distress.    Appearance: Normal appearance. He is not ill-appearing.  HENT:     Head: Normocephalic and atraumatic.  Eyes:     Conjunctiva/sclera: Conjunctivae normal.  Cardiovascular:     Rate and Rhythm: Normal rate and regular rhythm.     Heart sounds: Normal heart sounds.     Comments: Occ premature beat Pulmonary:     Effort: Pulmonary effort is normal. No respiratory distress.     Breath sounds: Normal breath sounds. No wheezing or rales.  Musculoskeletal:     Right lower leg: No  edema.     Left lower leg: No edema.  Skin:    General: Skin is warm and dry.     Findings: No rash.  Neurological:     Mental Status: He is alert. Mental status is at baseline.  Psychiatric:        Mood and Affect: Mood normal.        Lab Results  Component Value Date   WBC 7.1 10/12/2022   HGB 16.0 10/12/2022   HCT 49.2 10/12/2022   PLT 223.0 10/12/2022   GLUCOSE 135 (H) 10/12/2022   CHOL 121 10/12/2022   TRIG 88.0 10/12/2022   HDL 58.20 10/12/2022   LDLDIRECT 91.0 08/23/2016   LDLCALC 45 10/12/2022   ALT 19 10/12/2022   AST 17 10/12/2022   NA 139 10/12/2022   K 4.8 10/12/2022   CL 103 10/12/2022   CREATININE 0.97 10/12/2022   BUN 15 10/12/2022   CO2 27 10/12/2022   TSH 3.50 10/12/2022   PSA 0.76 04/25/2007   INR 2.6 09/26/2010   HGBA1C 6.9 (H) 10/24/2022   MICROALBUR 4.2 (H) 10/12/2022     Assessment & Plan:    See Problem List for Assessment and Plan of chronic medical problems.

## 2023-04-11 ENCOUNTER — Encounter: Payer: Self-pay | Admitting: Internal Medicine

## 2023-04-11 ENCOUNTER — Ambulatory Visit (INDEPENDENT_AMBULATORY_CARE_PROVIDER_SITE_OTHER): Payer: Medicare Other | Admitting: Internal Medicine

## 2023-04-11 VITALS — BP 138/80 | HR 76 | Temp 97.8°F | Wt 171.4 lb

## 2023-04-11 DIAGNOSIS — Z7984 Long term (current) use of oral hypoglycemic drugs: Secondary | ICD-10-CM

## 2023-04-11 DIAGNOSIS — E1165 Type 2 diabetes mellitus with hyperglycemia: Secondary | ICD-10-CM

## 2023-04-11 DIAGNOSIS — E7849 Other hyperlipidemia: Secondary | ICD-10-CM

## 2023-04-11 DIAGNOSIS — I10 Essential (primary) hypertension: Secondary | ICD-10-CM

## 2023-04-11 DIAGNOSIS — E039 Hypothyroidism, unspecified: Secondary | ICD-10-CM | POA: Diagnosis not present

## 2023-04-11 DIAGNOSIS — I4819 Other persistent atrial fibrillation: Secondary | ICD-10-CM

## 2023-04-11 DIAGNOSIS — R739 Hyperglycemia, unspecified: Secondary | ICD-10-CM

## 2023-04-11 DIAGNOSIS — Z23 Encounter for immunization: Secondary | ICD-10-CM

## 2023-04-11 LAB — CBC WITH DIFFERENTIAL/PLATELET
Basophils Absolute: 0.2 10*3/uL — ABNORMAL HIGH (ref 0.0–0.1)
Basophils Relative: 1.8 % (ref 0.0–3.0)
Eosinophils Absolute: 1.4 10*3/uL — ABNORMAL HIGH (ref 0.0–0.7)
Eosinophils Relative: 16.9 % — ABNORMAL HIGH (ref 0.0–5.0)
HCT: 52.8 % — ABNORMAL HIGH (ref 39.0–52.0)
Hemoglobin: 17.9 g/dL — ABNORMAL HIGH (ref 13.0–17.0)
Lymphocytes Relative: 23.7 % (ref 12.0–46.0)
Lymphs Abs: 2 10*3/uL (ref 0.7–4.0)
MCHC: 34 g/dL (ref 30.0–36.0)
MCV: 89.2 fl (ref 78.0–100.0)
Monocytes Absolute: 0.7 10*3/uL (ref 0.1–1.0)
Monocytes Relative: 8.8 % (ref 3.0–12.0)
Neutro Abs: 4.1 10*3/uL (ref 1.4–7.7)
Neutrophils Relative %: 48.8 % (ref 43.0–77.0)
Platelets: 212 10*3/uL (ref 150.0–400.0)
RBC: 5.93 Mil/uL — ABNORMAL HIGH (ref 4.22–5.81)
RDW: 14.8 % (ref 11.5–15.5)
WBC: 8.4 10*3/uL (ref 4.0–10.5)

## 2023-04-11 LAB — LIPID PANEL
Cholesterol: 113 mg/dL (ref 0–200)
HDL: 56.6 mg/dL (ref >39.00)
LDL Cholesterol: 39 mg/dL (ref 0–99)
NonHDL: 56.6
Total CHOL/HDL Ratio: 2
Triglycerides: 88 mg/dL (ref 0.0–149.0)
VLDL: 17.6 mg/dL (ref 0.0–40.0)

## 2023-04-11 LAB — COMPREHENSIVE METABOLIC PANEL WITH GFR
ALT: 14 U/L (ref 0–53)
AST: 14 U/L (ref 0–37)
Albumin: 4.9 g/dL (ref 3.5–5.2)
Alkaline Phosphatase: 62 U/L (ref 39–117)
BUN: 21 mg/dL (ref 6–23)
CO2: 27 meq/L (ref 19–32)
Calcium: 10.3 mg/dL (ref 8.4–10.5)
Chloride: 101 meq/L (ref 96–112)
Creatinine, Ser: 0.92 mg/dL (ref 0.40–1.50)
GFR: 80.82 mL/min (ref >60.00)
Glucose, Bld: 122 mg/dL — ABNORMAL HIGH (ref 70–99)
Potassium: 5 meq/L (ref 3.5–5.1)
Sodium: 137 meq/L (ref 135–145)
Total Bilirubin: 0.6 mg/dL (ref 0.2–1.2)
Total Protein: 8.1 g/dL (ref 6.0–8.3)

## 2023-04-11 LAB — HEMOGLOBIN A1C: Hgb A1c MFr Bld: 7.1 % — ABNORMAL HIGH (ref 4.6–6.5)

## 2023-04-11 MED ORDER — METFORMIN HCL ER 500 MG PO TB24: 1000.0000 mg | ORAL_TABLET | Freq: Two times a day (BID) | ORAL | 1 refills | Status: DC

## 2023-04-11 MED ORDER — TAMSULOSIN HCL 0.4 MG PO CAPS: 0.4000 mg | ORAL_CAPSULE | Freq: Every day | ORAL | 2 refills | Status: DC

## 2023-04-11 MED ORDER — APIXABAN 5 MG PO TABS: 5.0000 mg | ORAL_TABLET | Freq: Two times a day (BID) | ORAL | 1 refills | Status: DC

## 2023-04-11 MED ORDER — LEVOTHYROXINE SODIUM 88 MCG PO TABS: 88.0000 ug | ORAL_TABLET | Freq: Every day | ORAL | 3 refills | Status: AC

## 2023-04-11 MED ORDER — LISINOPRIL 2.5 MG PO TABS: 2.5000 mg | ORAL_TABLET | Freq: Every day | ORAL | 3 refills | Status: DC

## 2023-04-11 MED ORDER — GLIPIZIDE 5 MG PO TABS: 5.0000 mg | ORAL_TABLET | Freq: Two times a day (BID) | ORAL | 1 refills | Status: DC

## 2023-04-11 NOTE — Assessment & Plan Note (Signed)
 Chronic  Lab Results  Component Value Date   HGBA1C 6.9 (H) 10/24/2022   Sugars controlled Check a1c Continue Jardiance 25 mg daily, glipizide 5 mg twice daily AC, metformin XR 1000 mg twice daily AC, Rybelsus 14 mg daily Stressed regular exercise, diabetic diet

## 2023-04-11 NOTE — Assessment & Plan Note (Signed)
 Chronic Regular exercise and healthy diet encouraged Check lipid panel, cmp Continue atorvastatin 10 mg daily

## 2023-04-11 NOTE — Assessment & Plan Note (Addendum)
 Chronic Blood pressure borderline controlled Cmp, cbc Continue amlodipine 10 mg daily, lisinopril 2.5 mg daily, metoprolol XL 250 mg daily

## 2023-04-11 NOTE — Assessment & Plan Note (Signed)
 Chronic  Clinically euthyroid Check tsh and will titrate med dose if needed Currently taking levothyroxine 88 mcg daily

## 2023-04-11 NOTE — Assessment & Plan Note (Signed)
Chronic Following with cardiology On Xarelto 20 mg daily, metoprolol XL 250 mg daily CBC, CMP

## 2023-04-12 LAB — TSH: TSH: 2 u[IU]/mL (ref 0.35–5.50)

## 2023-04-15 ENCOUNTER — Encounter: Payer: Self-pay | Admitting: Internal Medicine

## 2023-05-02 ENCOUNTER — Telehealth: Payer: Self-pay | Admitting: Internal Medicine

## 2023-05-02 NOTE — Telephone Encounter (Signed)
 Spoke with the patient who states that he has been going in and out of afib for a couple of months. He states that he feels fine and is not symptomatic but he can tell when he is out of rhythm. He states that he can feel it mostly when he is lying down. He reports that he is taking Elquis and metoprolol as prescribed. He does have instructions to take as needed metoprolol for palpitations but has not used it yet. Reports his heart rate has been in the 60s. Advised to be cautious if taking extra metoprolol as to not drop his heart rate too low. Asked if he would be interested in wearing a heart monitor for a couple weeks, however he states that he is going to be going out of town to [place] for about a month so would prefer not to wear one while he is gone. Will make Dr. Rodolfo Clan aware.

## 2023-05-02 NOTE — Telephone Encounter (Signed)
  Per MyChart scheduling message:   Initial complaint: Intermittent symptoms of A-Fib   Patient c/o Palpitations: STAT if patient c/o lightheadedness, shortness of breath, or chest pain  How long have you had palpitations/irregular HR/ Afib? Are you having the symptoms now?   Are you currently experiencing lightheadedness, SOB or CP?   Do you have a history of afib (atrial fibrillation) or irregular heart rhythm?   Have you checked your BP or HR? (document readings if available):   Are you experiencing any other symptoms?     1.   Past month or so......Jeffrey AasOn and Off.Jeffrey AasAaron AasAaron AasMost of the time heart rhythm is regular 2.  No 3. Yes 4. 135/76   66BPM 5. No

## 2023-05-03 ENCOUNTER — Ambulatory Visit (INDEPENDENT_AMBULATORY_CARE_PROVIDER_SITE_OTHER): Payer: Medicare Other

## 2023-05-03 VITALS — Ht 68.0 in | Wt 167.0 lb

## 2023-05-03 DIAGNOSIS — Z0000 Encounter for general adult medical examination without abnormal findings: Secondary | ICD-10-CM

## 2023-05-03 NOTE — Patient Instructions (Addendum)
 [name] , Thank you for taking time to come for your Medicare Wellness Visit. I appreciate your ongoing commitment to your health goals. Please review the following plan we discussed and let me know if I can assist you in the future.   Referrals/Orders/Follow-Ups/Clinician Recommendations: It was nice talking with you today.  Each day, aim for 6 glasses of water, plenty of protein in your diet and try to get up and walk/ stretch every hour for 5-10 minutes at a time.  Happy [name]  This is a list of the screening recommended for you and due dates:  Health Maintenance  Topic Date Due   Zoster (Shingles) Vaccine (1 of 2) 10/16/1996   Colon Cancer Screening  09/04/2022   Flu Shot  08/17/2023   Yearly kidney health urinalysis for diabetes  10/12/2023   Hemoglobin A1C  10/12/2023   Complete foot exam   12/06/2023   Eye exam for diabetics  01/05/2024   Yearly kidney function blood test for diabetes  04/10/2024   Medicare Annual Wellness Visit  05/02/2024   DTaP/Tdap/Td vaccine (3 - Td or Tdap) 03/19/2031   Pneumonia Vaccine  Completed   Hepatitis C Screening  Completed   HPV Vaccine  Aged Out   Meningitis B Vaccine  Aged Out   COVID-19 Vaccine  Discontinued    Advanced directives: (Copy Requested) Please bring a copy of your health care power of attorney and living will to the office to be added to your chart at your convenience. You can mail to [facility] [address] [place], [place] [address] or email to [email or web address]  Next Medicare Annual Wellness Visit scheduled for next year: Yes

## 2023-05-03 NOTE — Progress Notes (Signed)
 Subjective:   [name] is a 78 y.o. who presents for a Medicare Wellness preventive visit.  Visit Complete: Virtual I connected with  [name] on 05/03/23 by a audio enabled telemedicine application and verified that I am speaking with the correct person using two identifiers.  Patient Location: Home  Provider Location: Home Office  I discussed the limitations of evaluation and management by telemedicine. The patient expressed understanding and agreed to proceed.  Vital Signs: Because this visit was a virtual/telehealth visit, some criteria may be missing or patient reported. Any vitals not documented were not able to be obtained and vitals that have been documented are patient reported.  VideoDeclined- This patient declined Librarian, academic. Therefore the visit was completed with audio only.  Persons Participating in Visit: Patient.  AWV Questionnaire: Yes: Patient Medicare AWV questionnaire was completed by the patient on 05/02/2023; I have confirmed that all information answered by patient is correct and no changes since this date.        Objective:    Today's Vitals   05/03/23 1035  Weight: 167 lb (75.8 kg)  Height: 5\' 8"  (1.727 m)   Body mass index is 25.39 kg/m.     05/03/2023   10:38 AM 05/02/2022   10:48 AM 12/29/2020   11:22 AM 11/03/2019   10:04 AM 09/04/2018   11:07 AM 06/26/2017   10:24 AM 02/04/2016   10:10 AM  Advanced Directives  Does Patient Have a Medical Advance Directive? Yes Yes Yes Yes Yes Yes No  Type of Estate agent of [place];Living will Healthcare Power of [place];Living will Living will;Healthcare Power of Attorney Living will;Healthcare Power of [employer] Power of [place];Living will Healthcare Power of [place];Living will   Does patient want to make changes to medical advance directive?   No - Patient declined No - Patient declined     Copy of Healthcare Power  of Attorney in Chart? No - copy requested No - copy requested No - copy requested No - copy requested No - copy requested No - copy requested     Current Medications (verified) Outpatient Encounter Medications as of 05/03/2023  Medication Sig   amLODipine (NORVASC) 10 MG tablet Take 1 tablet (10 mg total) by mouth daily.   apixaban (ELIQUIS) 5 MG TABS tablet Take 1 tablet (5 mg total) by mouth 2 (two) times daily.   atorvastatin (LIPITOR) 10 MG tablet Take 1 tablet (10 mg total) by mouth daily.   augmented betamethasone dipropionate (DIPROLENE-AF) 0.05 % cream Apply topically.   brimonidine (ALPHAGAN) 0.2 % ophthalmic solution 1 drop 2 (two) times daily.   dorzolamide (TRUSOPT) 2 % ophthalmic solution SMARTSIG:In Eye(s)   empagliflozin (JARDIANCE) 25 MG TABS tablet Take 1 tablet (25 mg total) by mouth daily before breakfast.   finasteride (PROPECIA) 1 MG tablet Take 0.25 mg by mouth daily.   glipiZIDE (GLUCOTROL) 5 MG tablet Take 1 tablet (5 mg total) by mouth 2 (two) times daily before a meal.   Latanoprostene Bunod (VYZULTA) 0.024 % SOLN Place 1 drop into both eyes daily.   levothyroxine (SYNTHROID) 88 MCG tablet Take 1 tablet (88 mcg total) by mouth daily.   lisinopril (ZESTRIL) 2.5 MG tablet Take 1 tablet (2.5 mg total) by mouth daily.   Magnesium Oxide 500 MG CAPS Take 1 capsule by mouth 2 (two) times daily.   metFORMIN (GLUCOPHAGE-XR) 500 MG 24 hr tablet Take 2 tablets (1,000 mg total) by mouth 2 (two) times  daily.   metoprolol succinate (TOPROL-XL) 100 MG 24 hr tablet Take 2.5 tablets (250 mg total) by mouth daily. Take an extra 100 mg daily prn for palpitations.   Take with or immediately following a meal.   Semaglutide (RYBELSUS) 14 MG TABS Take 1 tablet (14 mg total) by mouth daily. TAKE ONE TABLET BY MOUTH ONE TIME DAILY   tamsulosin (FLOMAX) 0.4 MG CAPS capsule Take 1 capsule (0.4 mg total) by mouth daily.   timolol (TIMOPTIC) 0.5 % ophthalmic solution 1 drop every morning.   No  facility-administered encounter medications on file as of 05/03/2023.    Allergies (verified) Farxiga [dapagliflozin]   History: Past Medical History:  Diagnosis Date   AF (atrial fibrillation) (HCC)    persistent afib, s/p afib ablation x 2 with subsequent convergent procedure at [facility]   Allergic rhinitis    seasonal   Atrial flutter (HCC)    CTS (carpal tunnel syndrome)    PMH of   Diabetes mellitus    type 2   External hemorrhoids    Glaucoma    Hyperlipidemia    Hypertension    Hypothyroidism    Renal calculus 2005   [facility]   Past Surgical History:  Procedure Laterality Date   ABLATION  06/16/2013   atypical atrial flutter ablation by Dr Quince Bryant at [facility] (mitral annular flutter ablated)   afib 09/22/2008--11/03/2009     afib ablation x 2 by Dr Nunzio Belch with subsequent convergent procedure at [facility]   CARDIAC CATHETERIZATION  01/17/2003   non obstructive CAD   CARDIOVERSION     CARDIOVERSION  12/09/2010   Procedure: CARDIOVERSION;  Surgeon: Peder Bourdon, MD;  Location: [facility] OR;  Service: Cardiovascular;  Laterality: N/A;   CARDIOVERSION  09/15/2011   Procedure: CARDIOVERSION;  Surgeon: Loyde Rule, MD;  Location: [facility] ENDOSCOPY;  Service: Cardiovascular;  Laterality: N/A;  office drawing labs wed 8/28   CARDIOVERSION N/A 07/18/2012   Procedure: CARDIOVERSION;  Surgeon: Lake Pilgrim, MD;  Location: [facility] ENDOSCOPY;  Service: Cardiovascular;  Laterality: N/A;   CARDIOVERSION  08/17/2014   [place]   COLONOSCOPY  01/17/2007   Dr Adan Holms   convergent procedure  01/17/2011   at [facility] for a fib   JOINT REPLACEMENT Right 2008, 2012   LOOP RECORDER IMPLANT Left 12/17/2010   SQUAMOUS CELL CARCINOMA EXCISION     TONSILLECTOMY     TOTAL HIP ARTHROPLASTY  06/17/2010   Depuy prosthesis replacement, Dr Bernard Brick   TOTAL HIP ARTHROPLASTY Right 01/16/2006    Depuy;Dr Bernard Brick   Family History  Problem Relation Age of Onset   Diabetes Father    Stroke Father        mid 55s    Lung cancer Mother        non smoker   Stroke Mother 34   Testicular cancer Brother    Allergies Maternal Grandmother    Cancer Maternal Grandmother        unsure   Allergies Brother    Heart attack Neg Hx    Social History   Socioeconomic History   Marital status: Married    Spouse name: Haskell Linker   Number of children: 2   Years of education: Not on file   Highest education level: Associate degree: occupational, Scientist, product/process development, or vocational program  Occupational History   Occupation: RETIRED/BUYER    Employer: [employer]  Tobacco Use   Smoking status: Former    Current packs/day: 0.00    Average packs/day:  0.9 packs/day for 8.0 years (7.2 ttl pk-yrs)    Types: Cigarettes    Start date: 01/16/1966    Quit date: 01/16/1974    Years since quitting: 49.3   Smokeless tobacco: Never   Tobacco comments:    smoked  1966- 1976,up to 1 ppd  Vaping Use   Vaping status: Never Used  Substance and Sexual Activity   Alcohol use: Yes    Comment: socially   Drug use: No   Sexual activity: Not on file  Other Topics Concern   Not on file  Social History Narrative   Livings with wife [name]   Social Drivers of Health   Financial Resource Strain: Low Risk  (05/03/2023)   Overall Financial Resource Strain (CARDIA)    Difficulty of Paying Living Expenses: Not very hard  Food Insecurity: No Food Insecurity (05/03/2023)   Hunger Vital Sign    Worried About Running Out of Food in the Last Year: Never true    Ran Out of Food in the Last Year: Never true  Transportation Needs: No Transportation Needs (05/03/2023)   PRAPARE - Administrator, Civil Service (Medical): No    Lack of Transportation (Non-Medical): No  Physical Activity: Inactive (05/03/2023)   Exercise Vital Sign    Days of Exercise per Week: 0 days    Minutes of Exercise per Session: 0 min  Stress: No Stress Concern Present (05/03/2023)   [employer] of Occupational Health - Occupational Stress  Questionnaire    Feeling of Stress : Not at all  Social Connections: Moderately Integrated (05/03/2023)   Social Connection and Isolation Panel [NHANES]    Frequency of Communication with Friends and Family: Twice a week    Frequency of Social Gatherings with Friends and Family: Never    Attends Religious Services: More than 4 times per year    Active Member of [employer] or Organizations: Yes    Attends Banker Meetings: Never    Marital Status: Married    Tobacco Counseling Counseling given: Not Answered Tobacco comments: smoked  1966- 1976,up to 1 ppd    Clinical Intake:  Pre-visit preparation completed: Yes  Pain : No/denies pain     BMI - recorded: 25.39 Nutritional Status: BMI 25 -29 Overweight Nutritional Risks: None Diabetes: Yes CBG done?: Yes (120 -Per pt) CBG resulted in Enter/ Edit results?: No Did pt. bring in CBG monitor from home?: No  Lab Results  Component Value Date   HGBA1C 7.1 (H) 04/11/2023   HGBA1C 6.9 (H) 10/24/2022   HGBA1C 6.5 (A) 04/12/2022     How often do you need to have someone help you when you read instructions, pamphlets, or other written materials from your doctor or pharmacy?: 1 - Never  Interpreter Needed?: No  Information entered by :: Aleyah Balik, RMA   Activities of Daily Living     05/02/2023    9:15 AM  In your present state of health, do you have any difficulty performing the following activities:  Hearing? 0  Vision? 0  Difficulty concentrating or making decisions? 0  Walking or climbing stairs? 0  Dressing or bathing? 0  Doing errands, shopping? 0  Preparing Food and eating ? N  Using the Toilet? N  In the past six months, have you accidently leaked urine? Y  Do you have problems with loss of bowel control? N  Managing your Medications? N  Managing your Finances? N  Housekeeping or managing your Housekeeping? N  Patient Care Team: Pincus Sanes, MD as PCP - General (Internal Medicine) Hillis Range, MD (Inactive) as PCP - Cardiology (Cardiology) Davina Poke as Consulting Physician (Optometry) [place], Garry Heater, [facility] (Inactive) as Pharmacist (Pharmacist) Sherrie George, MD as Consulting Physician (Ophthalmology)  Indicate any recent Medical Services you may have received from other than [facility] providers in the past year (date may be approximate).     Assessment:   This is a routine wellness examination for [place].  Hearing/Vision screen Hearing Screening - Comments:: Denies hearing difficulties   Vision Screening - Comments:: Denies vision issues.    Goals Addressed             This Visit's Progress    Patient Stated   On track    Lose weight by monitoring my diet and eating more veg. Increase my activity by starting to walk again.        Depression Screen     05/03/2023   10:42 AM 05/02/2022   12:54 PM 09/28/2021    2:25 PM 12/29/2020   11:35 AM 11/03/2019   10:02 AM 09/04/2018   11:07 AM 09/04/2018    9:28 AM  PHQ 2/9 Scores  PHQ - 2 Score 0 0 0 0 0 1 0  PHQ- 9 Score 1          Fall Risk     05/02/2023    9:15 AM 05/02/2022   10:41 AM 04/29/2022   11:26 AM 09/28/2021    2:25 PM 12/29/2020   11:24 AM  Fall Risk   Falls in the past year? 0 0 0 0 0  Number falls in past yr:  0  0 0  Injury with Fall?  0 0 0 0  Risk for fall due to :    No Fall Risks No Fall Risks  Follow up Falls evaluation completed;Falls prevention discussed   Falls evaluation completed Falls prevention discussed    MEDICARE RISK AT HOME:  Medicare Risk at Home Any stairs in or around the home?: (Patient-Rptd) Yes If so, are there any without handrails?: (Patient-Rptd) No Home free of loose throw rugs in walkways, pet beds, electrical cords, etc?: (Patient-Rptd) Yes Adequate lighting in your home to reduce risk of falls?: (Patient-Rptd) Yes Life alert?: (Patient-Rptd) No Use of a cane, walker or w/c?: (Patient-Rptd) No Grab bars in the bathroom?: (Patient-Rptd) No Shower  chair or bench in shower?: (Patient-Rptd) No Elevated toilet seat or a handicapped toilet?: (Patient-Rptd) No  TIMED UP AND GO:  Was the test performed?  No  Cognitive Function: 6CIT completed        05/03/2023   10:39 AM 05/02/2022   10:50 AM  6CIT Screen  What Year? 0 points 0 points  What month? 0 points 0 points  What time? 0 points 0 points  Count back from 20 0 points 0 points  Months in reverse 0 points 0 points  Repeat phrase 0 points 0 points  Total Score 0 points 0 points    Immunizations Immunization History  Administered Date(s) Administered   Fluad Quad(high Dose 65+) 09/02/2018, 09/22/2021, 09/05/2022   Hepatitis A 03/27/2000, 10/16/2000   Influenza Whole 10/17/2011   Influenza, High Dose Seasonal PF 09/28/2015, 10/13/2016, 09/28/2017   Influenza,inj,Quad PF,6+ Mos 10/16/2012, 09/29/2014, 10/13/2016   Influenza-Unspecified 10/30/2013, 09/02/2018, 09/25/2019   PFIZER(Purple Top)SARS-COV-2 Vaccination 02/10/2019, 03/03/2019   Pneumococcal Conjugate-13 02/03/2015   Pneumococcal Polysaccharide-23 04/11/2013   Tdap 09/21/2006, 03/18/2021   Typhoid Inactivated 03/27/2000  Typhoid Live 10/19/2003   Zoster, Live 01/05/2010    Screening Tests Health Maintenance  Topic Date Due   Zoster Vaccines- Shingrix (1 of 2) 10/16/1996   Colonoscopy  09/04/2022   INFLUENZA VACCINE  08/17/2023   Diabetic kidney evaluation - Urine ACR  10/12/2023   HEMOGLOBIN A1C  10/12/2023   FOOT EXAM  12/06/2023   OPHTHALMOLOGY EXAM  01/05/2024   Diabetic kidney evaluation - eGFR measurement  04/10/2024   Medicare Annual Wellness (AWV)  05/02/2024   DTaP/Tdap/Td (3 - Td or Tdap) 03/19/2031   Pneumonia Vaccine 41+ Years old  Completed   Hepatitis C Screening  Completed   HPV VACCINES  Aged Out   Meningococcal B Vaccine  Aged Out   COVID-19 Vaccine  Discontinued    Health Maintenance  Health Maintenance Due  Topic Date Due   Zoster Vaccines- Shingrix (1 of 2) 10/16/1996    Colonoscopy  09/04/2022   Health Maintenance Items Addressed: See Nurse Notes  Additional Screening:  Vision Screening: Recommended annual ophthalmology exams for early detection of glaucoma and other disorders of the eye.  Dental Screening: Recommended annual dental exams for proper oral hygiene  Community Resource Referral / Chronic Care Management: CRR required this visit?  No   CCM required this visit?  No     Plan:     I have personally reviewed and noted the following in the patient's chart:   Medical and social history Use of alcohol, tobacco or illicit drugs  Current medications and supplements including opioid prescriptions. Patient is not currently taking opioid prescriptions. Functional ability and status Nutritional status Physical activity Advanced directives List of other physicians Hospitalizations, surgeries, and ER visits in previous 12 months Vitals Screenings to include cognitive, depression, and falls Referrals and appointments  In addition, I have reviewed and discussed with patient certain preventive protocols, quality metrics, and best practice recommendations. A written personalized care plan for preventive services as well as general preventive health recommendations were provided to patient.     Adyn Serna L Agastya Meister, CMA   05/03/2023   After Visit Summary: (MyChart) Due to this being a telephonic visit, the after visit summary with patients personalized plan was offered to patient via MyChart   Notes: Please refer to Routing Comments.

## 2023-05-07 ENCOUNTER — Encounter: Payer: Self-pay | Admitting: Internal Medicine

## 2023-05-10 NOTE — Telephone Encounter (Signed)
 Spoke with pt who states he would be willing to wear monitor once he returns from the beach and he will plan to contact our office at that time or sooner if needed.

## 2023-06-05 ENCOUNTER — Other Ambulatory Visit: Payer: Self-pay

## 2023-06-05 ENCOUNTER — Encounter: Payer: Self-pay | Admitting: Internal Medicine

## 2023-06-05 DIAGNOSIS — E039 Hypothyroidism, unspecified: Secondary | ICD-10-CM

## 2023-06-06 ENCOUNTER — Other Ambulatory Visit: Payer: Self-pay

## 2023-06-06 MED ORDER — METOPROLOL SUCCINATE ER 100 MG PO TB24: 250.0000 mg | ORAL_TABLET | Freq: Every day | ORAL | 3 refills | Status: AC

## 2023-06-06 MED ORDER — METFORMIN HCL ER 500 MG PO TB24: 1000.0000 mg | ORAL_TABLET | Freq: Two times a day (BID) | ORAL | 1 refills | Status: AC

## 2023-06-20 ENCOUNTER — Encounter: Payer: Self-pay | Admitting: Internal Medicine

## 2023-06-25 ENCOUNTER — Ambulatory Visit: Admitting: Internal Medicine

## 2023-06-27 ENCOUNTER — Encounter: Payer: Self-pay | Admitting: Internal Medicine

## 2023-06-27 ENCOUNTER — Ambulatory Visit: Admitting: Internal Medicine

## 2023-06-27 ENCOUNTER — Telehealth: Payer: Self-pay

## 2023-06-27 VITALS — BP 124/72 | HR 84 | Ht 68.0 in | Wt 172.1 lb

## 2023-06-27 DIAGNOSIS — Z860100 Personal history of colon polyps, unspecified: Secondary | ICD-10-CM

## 2023-06-27 DIAGNOSIS — R1032 Left lower quadrant pain: Secondary | ICD-10-CM

## 2023-06-27 DIAGNOSIS — R1030 Lower abdominal pain, unspecified: Secondary | ICD-10-CM | POA: Diagnosis not present

## 2023-06-27 DIAGNOSIS — Z860101 Personal history of adenomatous and serrated colon polyps: Secondary | ICD-10-CM | POA: Diagnosis not present

## 2023-06-27 DIAGNOSIS — R198 Other specified symptoms and signs involving the digestive system and abdomen: Secondary | ICD-10-CM

## 2023-06-27 DIAGNOSIS — I4891 Unspecified atrial fibrillation: Secondary | ICD-10-CM

## 2023-06-27 DIAGNOSIS — Z7901 Long term (current) use of anticoagulants: Secondary | ICD-10-CM | POA: Diagnosis not present

## 2023-06-27 MED ORDER — NA SULFATE-K SULFATE-MG SULF 17.5-3.13-1.6 GM/177ML PO SOLN: 1.0000 | Freq: Once | ORAL | 0 refills | Status: AC

## 2023-06-27 NOTE — Patient Instructions (Addendum)
 You have been scheduled for a colonoscopy. Please follow written instructions given to you at your visit today.   If you use inhalers (even only as needed), please bring them with you on the day of your procedure.  DO NOT TAKE 7 DAYS PRIOR TO TEST- Trulicity (dulaglutide) Ozempic , Wegovy  (semaglutide ) Mounjaro (tirzepatide) Bydureon Bcise (exanatide extended release)  DO NOT TAKE 1 DAY PRIOR TO YOUR TEST Rybelsus  (semaglutide ) Adlyxin (lixisenatide) Victoza (liraglutide) Byetta (exanatide) ___________________________________________________________________________   [name] will be contacted by our office prior to your procedure for directions on holding your ELIQUIS .  If you do not hear from our office 1 week prior to your scheduled procedure, please call [phone] to discuss.    Thank you for entrusting me with your care and for choosing [facility], Dr. Laurell Pond

## 2023-06-27 NOTE — Telephone Encounter (Signed)
 [facility] Pre-operative Risk Assessment     Request for surgical clearance:     Endoscopy Procedure  What type of surgery is being performed?     COLONOSCOPY  When is this surgery scheduled?     09-03-23  What type of clearance is required ?   Pharmacy  Are there any medications that need to be held prior to surgery and how long? ELIQUIS  2 DAY  Practice name and name of physician performing surgery?      [facility]  DR Bridgett Camps  What is your office phone and fax number?      Phone- [phone]  Fax- [phone]   Anesthesia type (None, local, MAC, general) ?       MAC  PATIENT HAS AN UPCOMING APPOINTMENT ON FRIDAY 6-13   Please route your response to Marti Acebo, CMA

## 2023-06-27 NOTE — Progress Notes (Signed)
 Subjective:    Patient ID: [name], male    DOB: [birth year], 78 y.o.   MRN: [id number]  HPI Mike Lenhoff is a 78 year old male with a history of adenomatous colon polyps, atrial fibrillation status post ablation on Eliquis , diabetes, hypertension, hyperlipidemia and hypothyroidism who is here to reestablish care and discuss surveillance colonoscopy.  He is here alone today.  He has a history of atrial fibrillation and has undergone multiple ablations, including four standard ablations and a convergent procedure. He experiences palpitations described as 'a skip beat' and irregular heartbeats, particularly noticeable during cruises when his diet becomes irregular. He has not yet started using a new monitor to assess these symptoms. He is currently on Eliquis  for atrial fibrillation.  He has a history of a small precancerous polyp removed during a colonoscopy six years ago. Given his age and history of polyps, he is considering another colonoscopy. He experiences occasional abdominal pain and significant bowel sounds, described as 'like a squawk, squeak toy,' but these do not coincide with the pain. His bowel movements are generally once or twice a day, but he experiences constipation when traveling. He is on Rubellis, which affects intestinal motility.  No family history of colorectal cancer  Review of Systems As per HPI, otherwise negative  Current Medications, Allergies, Past Medical History, Past Surgical History, Family History and Social History were reviewed in [employer] record.     Objective:   Physical Exam BP 124/72   Pulse 84   Ht 5' 8 (1.727 m)   Wt 172 lb 2 oz (78.1 kg)   SpO2 97%   BMI 26.17 kg/m  Gen: awake, alert, NAD HEENT: anicteric  Abd: soft, NT/ND, +BS throughout Ext: no c/c/e Neuro: nonfocal     Latest Ref Rng & Units 04/11/2023   11:03 AM 10/12/2022   10:53 AM 03/29/2022   10:53 AM  CBC  WBC 4.0 - 10.5 K/uL 8.4  7.1  6.1    Hemoglobin 13.0 - 17.0 g/dL 52.8  41.3  24.4   Hematocrit 39.0 - 52.0 % 52.8  49.2  51.3   Platelets 150.0 - 400.0 K/uL 212.0  223.0  213.0     CMP     Component Value Date/Time   NA 137 04/11/2023 1103   K 5.0 04/11/2023 1103   CL 101 04/11/2023 1103   CO2 27 04/11/2023 1103   GLUCOSE 122 (H) 04/11/2023 1103   BUN 21 04/11/2023 1103   CREATININE 0.92 04/11/2023 1103   CREATININE 1.10 09/26/2019 1102   CALCIUM  10.3 04/11/2023 1103   PROT 8.1 04/11/2023 1103   ALBUMIN 4.9 04/11/2023 1103   AST 14 04/11/2023 1103   ALT 14 04/11/2023 1103   ALKPHOS 62 04/11/2023 1103   BILITOT 0.6 04/11/2023 1103   GFR 80.82 04/11/2023 1103   GFRNONAA 67 09/26/2019 1102       Assessment & Plan:  78 year old male with a history of adenomatous colon polyps, atrial fibrillation status post ablation on Eliquis , diabetes, hypertension, hyperlipidemia and hypothyroidism who is here to reestablish care and discuss surveillance colonoscopy.   History of adenomatous colon polyp --nonadvanced; no family history of colon cancer -- Surveillance colonoscopy recommended, we discussed the risk, benefits and alternatives and he wishes to proceed -- He asked to proceed in mid August as his wife is recovering from lumpectomy and will have radiation for recently diagnosed breast cancer  2.  Chronic Eliquis  and atrial fibrillation status post ablation -- Will hold Eliquis   2 days prior to endoscopic procedures - will instruct when and how to resume after procedure. Benefits and risks of procedure explained including risks of bleeding, perforation, infection, missed lesions, reactions to medications and possible need for hospitalization and surgery for complications. Additional rare but real risk of stroke or other vascular clotting events off Eliquis  also explained and need to seek urgent help if any signs of these problems occur. Will communicate by phone or EMR with patient's  prescribing provider to confirm that  holding Eliquis  is reasonable in this case.   3.  Occasional lower abdominal cramping and borborygmi --noticed more with Rybelsus .  No alarm symptoms.  Colonoscopy as above.  Reassurance.

## 2023-06-28 NOTE — Telephone Encounter (Signed)
 Primary Cardiologist:James Allred, MD (Inactive)  Chart reviewed as part of pre-operative protocol coverage. Because of [name]'s past medical history and time since last visit, he/she will require a follow-up visit in order to better assess preoperative cardiovascular risk.  Pre-op covering staff: - Patient has an appointment on 06/29/23 with Jeffrey Doffing, NP at which time clearance will be addressed. Appointment notes have been updated. - Please contact requesting surgeon's office via preferred method (i.e, phone, fax) to inform them of need for appointment prior to surgery.  If applicable, this message will also be routed to pharmacy pool and/or primary cardiologist for input on holding anticoagulant/antiplatelet agent as requested below so that this information is available at time of patient's appointment.   Jeffrey Koch, NP-C  06/28/2023, 10:15 AM [name], [address] [place], [place] [address] Office [phone] Fax [phone]

## 2023-06-28 NOTE — Progress Notes (Signed)
 Electrophysiology Office Note:   Date:  06/29/2023  ID:  [name], DOB 13-Dec-1945, MRN [id number]  Primary Cardiologist: Jeffrey Needle, MD (Inactive) Primary Heart Failure: None Electrophysiologist: None      History of Present Illness:   [name] is a 78 y.o. male with h/o AF s/p ablation x2 / convergent at [facility] 2012, atypical AFL, HTN, aortic atherosclerosis, OSA, DM II,  seen today for routine electrophysiology followup.   Since last being seen in our clinic the patient reports he has had an occasional fluttering in his chest but is not sure he has had AF.  He was to complete a monitor in the fall but had several trips and waited.  He also had discussed taking out his current EOS loop and re-implanting a new Loop.      He denies chest pain, palpitations, dyspnea, PND, orthopnea, nausea, vomiting, dizziness, syncope, edema, weight gain, or early satiety.   Review of systems complete and found to be negative unless listed in HPI.   EP Information / Studies Reviewed:    EKG is not ordered today. EKG from 12/13/22 reviewed which showed SR 74 bpm, incomplete RBBB      Studies:  EPS 07/11/11 > successful convergent AF ablation, cumulative RF time 45 minutes, 93 lesions  Arrhythmia / AAD AF  MDT ILR > battery at EOS / inactive    Risk Assessment/Calculations:    CHA2DS2-VASc Score = 5   This indicates a 7.2% annual risk of stroke. The patient's score is based upon: CHF History: 0 HTN History: 1 Diabetes History: 1 Stroke History: 0 Vascular Disease History: 1 Age Score: 2 Gender Score: 0             Physical Exam:   VS:  BP 124/72 (BP Location: Left Arm, Patient Position: Sitting, Cuff Size: Normal)   Pulse 77   Resp 16   Ht 5' 8 (1.727 m)   Wt 171 lb 12.8 oz (77.9 kg)   SpO2 99%   BMI 26.12 kg/m    Wt Readings from Last 3 Encounters:  06/29/23 171 lb 12.8 oz (77.9 kg)  06/27/23 172 lb 2 oz (78.1 kg)  05/03/23 167 lb (75.8 kg)     GEN: Well  nourished, well developed in no acute distress NECK: No JVD; No carotid bruits CARDIAC: Regular rate and rhythm (occ ectopy noted on auscultation), no murmurs, rubs, gallops RESPIRATORY:  Clear to auscultation without rales, wheezing or rhonchi  ABDOMEN: Soft, non-tender, non-distended EXTREMITIES:  No edema; No deformity   ASSESSMENT AND PLAN:    Persistent Atrial Fibrillation  Atypical Atrial Flutter  CHA2DS2-VASc 5, hx of ablation x2 and convergent at [facility] 2013 -OAC for stroke prophylaxis -continue Toprol  250 mg daily  -no AF symptom burden    -plan for short term monitor to assess what he is experiencing  -reviewed AAD (amio/tikosyn ) which he has been on in the past if he is experiencing AF.  We discussed his history and it is unclear he would be a candidate for PF ablation if indeed he is having AF  Secondary Hypercoagulable State  -continue Eliquis  5mg  BID, dose reviewed and appropriate by age / wt   Hypertension  -well controlled on current regimen     Pre-Operative Clearance  Request for surgical clearance:  for colonoscopy  When is this surgery scheduled?  09-03-23 What type of clearance is required ?  Pharmacy Are there any medications that need to be held prior to surgery and  how long? Eliquis  2 day hold  Practice name and name of physician performing surgery? [facility] > Dr. Bridgett Peters  Office phone and fax number? Phone- [phone] Fax- [phone]  Anesthesia type (None, local, MAC, general) ? MAC       [name] perioperative risk of a major cardiac event is 0.9% according to the Revised Cardiac Risk Index (RCRI).  Therefore, he is at low risk for perioperative complications.     Recommendations: According to ACC/AHA guidelines, no further cardiovascular testing needed.  The patient may proceed to surgery at acceptable risk.    Antiplatelet and/or Anticoagulation Recommendations:  Dr. Bridgett Peters has proposed a 2 day hold prior to procedure which  is reasonable & anticipate this to be in line with pharmacy recommendations.  Will await pharmacy review before final Eliquis  recommendations.   Follow up with Dr. Marven Peters in 6 weeks post monitor for review    Signed, Jeffrey Doffing, NP-C, AGACNP-BC [facility] - Electrophysiology  06/29/2023, 12:29 PM

## 2023-06-29 ENCOUNTER — Encounter: Payer: Self-pay | Admitting: Pulmonary Disease

## 2023-06-29 ENCOUNTER — Ambulatory Visit: Attending: Pulmonary Disease | Admitting: Pulmonary Disease

## 2023-06-29 ENCOUNTER — Ambulatory Visit

## 2023-06-29 VITALS — BP 124/72 | HR 77 | Resp 16 | Ht 68.0 in | Wt 171.8 lb

## 2023-06-29 DIAGNOSIS — I4891 Unspecified atrial fibrillation: Secondary | ICD-10-CM

## 2023-06-29 DIAGNOSIS — D6869 Other thrombophilia: Secondary | ICD-10-CM | POA: Insufficient documentation

## 2023-06-29 DIAGNOSIS — I10 Essential (primary) hypertension: Secondary | ICD-10-CM | POA: Diagnosis not present

## 2023-06-29 NOTE — Progress Notes (Unsigned)
 Applied a 14 day Zio XT monitor to patient in the office   Lalla Brothers to read

## 2023-06-29 NOTE — Patient Instructions (Signed)
 Medication Instructions:  Your physician recommends that you continue on your current medications as directed. Please refer to the Current Medication list given to you today. *If you need a refill on your cardiac medications before your next appointment, please call your pharmacy*  Testing/Procedures: Zio Cardiac Monitor - wear for 14 days Your physician has recommended that you wear an event monitor. Event monitors are medical devices that record the heart's electrical activity. Doctors most often us  these monitors to diagnose arrhythmias. Arrhythmias are problems with the speed or rhythm of the heartbeat. The monitor is a small, portable device. You can wear one while you do your normal daily activities. This is usually used to diagnose what is causing palpitations/syncope (passing out).  Follow-Up: At [facility], you and your health needs are our priority.  As part of our continuing mission to provide you with exceptional heart care, our providers are all part of one team.  This team includes your primary Cardiologist (physician) and Advanced Practice Providers or APPs (Physician Assistants and Nurse Practitioners) who all work together to provide you with the care you need, when you need it.  Your next appointment:   6 week(s)  Provider:   Harvie Liner, MD   Other Instructions [name]- Long Term Monitor Instructions  Your physician has requested you wear a ZIO patch monitor for 14 days.  This is a single patch monitor. [employer] supplies one patch monitor per enrollment. Additional stickers are not available. Please do not apply patch if you will be having a Nuclear Stress Test,  Echocardiogram, Cardiac CT, MRI, or Chest Xray during the period you would be wearing the  monitor. The patch cannot be worn during these tests. You cannot remove and re-apply the  ZIO XT patch monitor.  Your ZIO patch monitor will be mailed 3 day USPS to your address on file. It may take 3-5 days   to receive your monitor after you have been enrolled.  Once you have received your monitor, please review the enclosed instructions. Your monitor  has already been registered assigning a specific monitor serial # to you.  Billing and Patient Assistance Program Information  We have supplied [employer] with any of your insurance information on file for billing purposes. [employer] offers a sliding scale Patient Assistance Program for patients that do not have  insurance, or whose insurance does not completely cover the cost of the ZIO monitor.  You must apply for the Patient Assistance Program to qualify for this discounted rate.  To apply, please call [employer] at [phone], select option 4, select option 2, ask to apply for  Patient Assistance Program. [name] will ask your household income, and how many people  are in your household. They will quote your out-of-pocket cost based on that information.  [employer] will also be able to set up a 38-month, interest-free payment plan if needed.  Applying the monitor   Shave hair from upper left chest.  Hold abrader disc by orange tab. Rub abrader in 40 strokes over the upper left chest as  indicated in your monitor instructions.  Clean area with 4 enclosed alcohol pads. Let dry.  Apply patch as indicated in monitor instructions. Patch will be placed under collarbone on left  side of chest with arrow pointing upward.  Rub patch adhesive wings for 2 minutes. Remove white label marked 1. Remove the white  label marked 2. Rub patch adhesive wings for 2 additional minutes.  While looking in a mirror, press and release  button in center of patch. A small green light will  flash 3-4 times. This will be your only indicator that the monitor has been turned on.  Do not shower for the first 24 hours. You may shower after the first 24 hours.  Press the button if you feel a symptom. You will hear a small click. Record Date, Time and  Symptom in the Patient  Logbook.  When you are ready to remove the patch, follow instructions on the last 2 pages of Patient  Logbook. Stick patch monitor onto the last page of Patient Logbook.  Place Patient Logbook in the blue and white box. Use locking tab on box and tape box closed  securely. The blue and white box has prepaid postage on it. Please place it in the mailbox as  soon as possible. Your physician should have your test results approximately 7 days after the  monitor has been mailed back to [facility].  Call [facility] Customer Care at [phone] if you have questions regarding  your ZIO XT patch monitor. Call them immediately if you see an orange light blinking on your  monitor.  If your monitor falls off in less than 4 days, contact our Monitor department at [phone].  If your monitor becomes loose or falls off after 4 days call [employer] at [phone] for  suggestions on securing your monitor

## 2023-07-02 NOTE — Telephone Encounter (Signed)
 Patient with diagnosis of afib on Eliquis  for anticoagulation.    Procedure: Colonoscopy Date of procedure: 09/03/2023   CHA2DS2-VASc Score = 5   This indicates a 7.2% annual risk of stroke. The patient's score is based upon: CHF History: 0 HTN History: 1 Diabetes History: 1 Stroke History: 0 Vascular Disease History: 1 Age Score: 2 Gender Score: 0    CrCl 75 mL/min  Platelet count 212 K   Patient has not had an Afib/aflutter ablation within the last 3 months or DCCV within the last 30 days Per office protocol, patient can hold Eliquis  for 2 days prior to procedure.   Patient will not need bridging with Lovenox  (enoxaparin ) around procedure.  **This guidance is not considered finalized until pre-operative APP has relayed final recommendations.**

## 2023-07-09 DIAGNOSIS — H401111 Primary open-angle glaucoma, right eye, mild stage: Secondary | ICD-10-CM | POA: Diagnosis not present

## 2023-07-09 DIAGNOSIS — H4052X1 Glaucoma secondary to other eye disorders, left eye, mild stage: Secondary | ICD-10-CM | POA: Diagnosis not present

## 2023-07-26 DIAGNOSIS — I4891 Unspecified atrial fibrillation: Secondary | ICD-10-CM | POA: Diagnosis not present

## 2023-07-31 ENCOUNTER — Ambulatory Visit: Payer: Self-pay | Admitting: Pulmonary Disease

## 2023-07-31 DIAGNOSIS — I4891 Unspecified atrial fibrillation: Secondary | ICD-10-CM

## 2023-08-13 NOTE — Telephone Encounter (Signed)
 Called patient and left message regarding hold Eliquis  on 8-16 and 8-17.  Asked him to call back to express understanding and seed if he has any questions.

## 2023-08-13 NOTE — Telephone Encounter (Signed)
 Eliquis  hold and cardiac risk assessment was addressed at 06/29/2023 office visit. Will resend office visit note to requesting office.   Barnie HERO. England Greb, DNP, NP-C  08/13/2023, 9:18 AM [facility] [address] Office [phone] Fax [phone]

## 2023-08-13 NOTE — Telephone Encounter (Signed)
 Please confirm patient has been cleared to hold Eliquis  for 2 days prior to procedure on 8-18 with Dr. Albertus.  Patient had Cardiology OV on 06-29-23. Thank you.

## 2023-08-15 NOTE — Telephone Encounter (Signed)
 Called and spoke to patient. He understands to hold Eliquis  starting on 8-16

## 2023-08-17 ENCOUNTER — Encounter: Payer: Self-pay | Admitting: Internal Medicine

## 2023-08-17 DIAGNOSIS — I4819 Other persistent atrial fibrillation: Secondary | ICD-10-CM

## 2023-08-17 DIAGNOSIS — E1165 Type 2 diabetes mellitus with hyperglycemia: Secondary | ICD-10-CM

## 2023-08-17 DIAGNOSIS — E039 Hypothyroidism, unspecified: Secondary | ICD-10-CM

## 2023-08-17 DIAGNOSIS — E1151 Type 2 diabetes mellitus with diabetic peripheral angiopathy without gangrene: Secondary | ICD-10-CM

## 2023-08-17 DIAGNOSIS — I10 Essential (primary) hypertension: Secondary | ICD-10-CM

## 2023-08-17 DIAGNOSIS — E7849 Other hyperlipidemia: Secondary | ICD-10-CM

## 2023-08-22 NOTE — Progress Notes (Unsigned)
  Electrophysiology Office Follow up Visit Note:    Date:  08/23/2023   ID:  [name], DOB 14-Oct-1945, MRN [id number]  PCP:  Geofm Glade PARAS, MD  [facility] Cardiologist:  Lynwood Rakers, MD (Inactive)  [facility] Electrophysiologist:  OLE ONEIDA HOLTS, MD    Interval History:     [name] is a 78 y.o. male who presents for a follow up visit.   The patient last saw [name] in clinic June 29, 2023.  He has a history of atrial fibrillation with 2 prior catheter ablations and a convergent procedure at [facility] in 2012.  He also has a history of atypical atrial flutter, hypertension, aortic atherosclerosis, sleep apnea, diabetes.  He has a loop recorder that is at end of service.  He is on an anticoagulant for stroke prophylaxis.  At the appointment with [name], a 2-week ZIO monitor was ordered which showed no atrial fibrillation.  He is with his wife today in clinic.  He is doing well.  He takes a lot of cruises.  He has an upcoming transatlantic cruise.      Past medical, surgical, social and family history were reviewed.  ROS:   Please see the history of present illness.    All other systems reviewed and are negative.  EKGs/Labs/Other Studies Reviewed:    The following studies were reviewed today:          Physical Exam:    VS:  BP 120/84 (BP Location: Right Arm, Patient Position: Sitting, Cuff Size: Normal)   Pulse 72   Ht 5' 8 (1.727 m)   Wt 170 lb (77.1 kg)   SpO2 97%   BMI 25.85 kg/m     Wt Readings from Last 3 Encounters:  08/23/23 170 lb (77.1 kg)  06/29/23 171 lb 12.8 oz (77.9 kg)  06/27/23 172 lb 2 oz (78.1 kg)     GEN: no distress CARD: RRR, No MRG RESP: No IWOB. CTAB.      ASSESSMENT:    1. Atrial fibrillation, unspecified type (HCC)   2. Primary hypertension    PLAN:    In order of problems listed above:  #Persistent atrial fibrillation Prior catheter ablation and convergent ablation at [facility].  On Eliquis  for stroke  prophylaxis.  Recent monitor showed no episodes of atrial fibrillation. Continue metoprolol  succinate  #Hypertension At goal today.  Recommend checking blood pressures 1-2 times per week at home and recording the values.  Recommend bringing these recordings to the primary care physician.  Follow-up 1 year with APP   Signed, OLE HOLTS, MD, [facility], [facility] 08/23/2023 10:19 AM    Electrophysiology [facility]

## 2023-08-23 ENCOUNTER — Ambulatory Visit: Attending: Cardiology | Admitting: Cardiology

## 2023-08-23 ENCOUNTER — Encounter: Payer: Self-pay | Admitting: Cardiology

## 2023-08-23 VITALS — BP 120/84 | HR 72 | Ht 68.0 in | Wt 170.0 lb

## 2023-08-23 DIAGNOSIS — I4891 Unspecified atrial fibrillation: Secondary | ICD-10-CM | POA: Insufficient documentation

## 2023-08-23 DIAGNOSIS — I10 Essential (primary) hypertension: Secondary | ICD-10-CM | POA: Diagnosis not present

## 2023-08-23 NOTE — Patient Instructions (Signed)

## 2023-09-03 ENCOUNTER — Ambulatory Visit (AMBULATORY_SURGERY_CENTER): Admitting: Internal Medicine

## 2023-09-03 ENCOUNTER — Encounter: Payer: Self-pay | Admitting: Internal Medicine

## 2023-09-03 VITALS — BP 124/66 | HR 75 | Temp 99.1°F | Resp 13 | Ht 68.0 in | Wt 172.0 lb

## 2023-09-03 DIAGNOSIS — K648 Other hemorrhoids: Secondary | ICD-10-CM

## 2023-09-03 DIAGNOSIS — I10 Essential (primary) hypertension: Secondary | ICD-10-CM | POA: Diagnosis not present

## 2023-09-03 DIAGNOSIS — K5730 Diverticulosis of large intestine without perforation or abscess without bleeding: Secondary | ICD-10-CM

## 2023-09-03 DIAGNOSIS — Z860100 Personal history of colon polyps, unspecified: Secondary | ICD-10-CM

## 2023-09-03 DIAGNOSIS — Z1211 Encounter for screening for malignant neoplasm of colon: Secondary | ICD-10-CM

## 2023-09-03 DIAGNOSIS — E119 Type 2 diabetes mellitus without complications: Secondary | ICD-10-CM | POA: Diagnosis not present

## 2023-09-03 DIAGNOSIS — I4891 Unspecified atrial fibrillation: Secondary | ICD-10-CM | POA: Diagnosis not present

## 2023-09-03 DIAGNOSIS — Z860101 Personal history of adenomatous and serrated colon polyps: Secondary | ICD-10-CM | POA: Diagnosis not present

## 2023-09-03 DIAGNOSIS — E039 Hypothyroidism, unspecified: Secondary | ICD-10-CM | POA: Diagnosis not present

## 2023-09-03 MED ORDER — SODIUM CHLORIDE 0.9 % IV SOLN: 500.0000 mL | Freq: Once | INTRAVENOUS | Status: DC

## 2023-09-03 NOTE — Progress Notes (Signed)
 Pt resting comfortably. VSS. Airway intact. SBAR complete to RN. All questions answered.

## 2023-09-03 NOTE — Patient Instructions (Signed)
 Resume previous diet.  Continue present medications. Resume Eliquis  (apixaban ) at prior dose today. Refer to managing physician for further adjustment of therapy. No recommendation at this time regarding repeat colonoscopy due to age and no polyps on today's exam. Handout provided on diverticulosis and hemorrhoids.   YOU HAD AN ENDOSCOPIC PROCEDURE TODAY AT THE [facility]:   Refer to the procedure report that was given to you for any specific questions about what was found during the examination.  If the procedure report does not answer your questions, please call your gastroenterologist to clarify.  If you requested that your care partner not be given the details of your procedure findings, then the procedure report has been included in a sealed envelope for you to review at your convenience later.  YOU SHOULD EXPECT: Some feelings of bloating in the abdomen. Passage of more gas than usual.  Walking can help get rid of the air that was put into your GI tract during the procedure and reduce the bloating. If you had a lower endoscopy (such as a colonoscopy or flexible sigmoidoscopy) you may notice spotting of blood in your stool or on the toilet paper. If you underwent a bowel prep for your procedure, you may not have a normal bowel movement for a few days.  Please Note:  You might notice some irritation and congestion in your nose or some drainage.  This is from the oxygen used during your procedure.  There is no need for concern and it should clear up in a day or so.  SYMPTOMS TO REPORT IMMEDIATELY:  Following lower endoscopy (colonoscopy or flexible sigmoidoscopy):  Excessive amounts of blood in the stool  Significant tenderness or worsening of abdominal pains  Swelling of the abdomen that is new, acute  Fever of 100F or higher  For urgent or emergent issues, a gastroenterologist can be reached at any hour by calling [phone]. Do not use MyChart messaging for urgent  concerns.    DIET:  We do recommend a small meal at first, but then you may proceed to your regular diet.  Drink plenty of fluids but you should avoid alcoholic beverages for 24 hours.  ACTIVITY:  You should plan to take it easy for the rest of today and you should NOT DRIVE or use heavy machinery until tomorrow (because of the sedation medicines used during the test).    FOLLOW UP: Our staff will call the number listed on your records the next business day following your procedure.  We will call around 7:15- 8:00 am to check on you and address any questions or concerns that you may have regarding the information given to you following your procedure. If we do not reach you, we will leave a message.     If any biopsies were taken you will be contacted by phone or by letter within the next 1-3 weeks.  Please call us  at [phone] if you have not heard about the biopsies in 3 weeks.    SIGNATURES/CONFIDENTIALITY: You and/or your care partner have signed paperwork which will be entered into your electronic medical record.  These signatures attest to the fact that that the information above on your After Visit Summary has been reviewed and is understood.  Full responsibility of the confidentiality of this discharge information lies with you and/or your care-partner.

## 2023-09-03 NOTE — Progress Notes (Signed)
 GASTROENTEROLOGY PROCEDURE H&P NOTE   Primary Care Physician: Geofm Glade PARAS, MD    Reason for Procedure:  History of adenomatous colon polyp  Plan:    Colonoscopy  Patient is appropriate for endoscopic procedure(s) in the ambulatory ([facility]) setting.  The nature of the procedure, as well as the risks, benefits, and alternatives were carefully and thoroughly reviewed with the patient. Ample time for discussion and questions allowed. The patient understood, was satisfied, and agreed to proceed.     HPI: [name] is a 78 y.o. male who presents for surveillance colonoscopy.  Medical history as below.  Tolerated the prep.  No recent chest pain or shortness of breath.  No abdominal pain today.  Eliquis  on hold x 2 days  Past Medical History:  Diagnosis Date   AF (atrial fibrillation) (HCC)    persistent afib, s/p afib ablation x 2 with subsequent convergent procedure at [facility]   Allergic rhinitis    seasonal   Atrial flutter (HCC)    CTS (carpal tunnel syndrome)    PMH of   Diabetes mellitus    type 2   External hemorrhoids    Glaucoma    Hyperlipidemia    Hypertension    Hypothyroidism    Renal calculus 2005   [facility]    Past Surgical History:  Procedure Laterality Date   ABLATION  06/16/2013   atypical atrial flutter ablation by Dr Craig at [facility] (mitral annular flutter ablated)   afib 09/22/2008--11/03/2009     afib ablation x 2 by Dr Kelsie with subsequent convergent procedure at [facility]   CARDIAC CATHETERIZATION  01/17/2003   non obstructive CAD   CARDIOVERSION     CARDIOVERSION  12/09/2010   Procedure: CARDIOVERSION;  Surgeon: Ezra Shuck, MD;  Location: [facility] OR;  Service: Cardiovascular;  Laterality: N/A;   CARDIOVERSION  09/15/2011   Procedure: CARDIOVERSION;  Surgeon: Maude JAYSON Emmer, MD;  Location: [facility] ENDOSCOPY;  Service: Cardiovascular;  Laterality: N/A;  office drawing labs wed 8/28   CARDIOVERSION N/A 07/18/2012   Procedure: CARDIOVERSION;   Surgeon: Aleene PARAS Passe, MD;  Location: [facility] ENDOSCOPY;  Service: Cardiovascular;  Laterality: N/A;   CARDIOVERSION  08/17/2014   [place]   COLONOSCOPY  01/17/2007   Dr Jakie   convergent procedure  01/17/2011   at [facility] for a fib   JOINT REPLACEMENT Right 2008, 2012   LOOP RECORDER IMPLANT Left 12/17/2010   SQUAMOUS CELL CARCINOMA EXCISION     TONSILLECTOMY     TOTAL HIP ARTHROPLASTY  06/17/2010   Depuy prosthesis replacement, Dr Ernie   TOTAL HIP ARTHROPLASTY Right 01/16/2006    Depuy;Dr Ernie    Prior to Admission medications   Medication Sig Start Date End Date Taking? Authorizing Provider  amLODipine  (NORVASC ) 10 MG tablet Take 1 tablet (10 mg total) by mouth daily. 01/23/23  Yes Burns, Glade PARAS, MD  atorvastatin  (LIPITOR) 10 MG tablet Take 1 tablet (10 mg total) by mouth daily. 01/29/23  Yes Burns, Glade PARAS, MD  brimonidine (ALPHAGAN) 0.2 % ophthalmic solution 1 drop 2 (two) times daily. 04/24/19  Yes [provider]  dorzolamide (TRUSOPT) 2 % ophthalmic solution SMARTSIG:In Eye(s) 08/05/20  Yes [provider]  empagliflozin  (JARDIANCE ) 25 MG TABS tablet Take 1 tablet (25 mg total) by mouth daily before breakfast. 01/23/23  Yes Burns, Glade PARAS, MD  finasteride (PROPECIA) 1 MG tablet Take 0.25 mg by mouth daily.   Yes [provider]  glipiZIDE  (GLUCOTROL ) 5 MG tablet  Take 1 tablet (5 mg total) by mouth 2 (two) times daily before a meal. 04/11/23  Yes Burns, Glade PARAS, MD  Latanoprostene Bunod (VYZULTA) 0.024 % SOLN Place 1 drop into both eyes daily.   Yes [provider]  levothyroxine  (SYNTHROID ) 88 MCG tablet Take 1 tablet (88 mcg total) by mouth daily. 04/11/23  Yes Burns, Glade PARAS, MD  lisinopril  (ZESTRIL ) 2.5 MG tablet Take 1 tablet (2.5 mg total) by mouth daily. 04/11/23  Yes Burns, Glade PARAS, MD  Magnesium  Oxide 500 MG CAPS Take 1 capsule by mouth 2 (two) times daily.   Yes [provider]  metFORMIN  (GLUCOPHAGE -XR) 500 MG 24 hr tablet Take 2  tablets (1,000 mg total) by mouth 2 (two) times daily. 06/06/23  Yes Burns, Glade PARAS, MD  metoprolol  succinate (TOPROL -XL) 100 MG 24 hr tablet Take 2.5 tablets (250 mg total) by mouth daily. Take an extra 100 mg daily prn for palpitations.   Take with or immediately following a meal. 06/06/23  Yes Burns, Glade PARAS, MD  timolol (TIMOPTIC) 0.5 % ophthalmic solution 1 drop every morning. 08/12/20  Yes [provider]  apixaban  (ELIQUIS ) 5 MG TABS tablet Take 1 tablet (5 mg total) by mouth 2 (two) times daily. 04/11/23   Geofm Glade PARAS, MD  augmented betamethasone dipropionate (DIPROLENE-AF) 0.05 % cream Apply topically. 09/24/21   [provider]  Semaglutide  (RYBELSUS ) 14 MG TABS Take 1 tablet (14 mg total) by mouth daily. TAKE ONE TABLET BY MOUTH ONE TIME DAILY 01/23/23   Geofm Glade PARAS, MD  tamsulosin  (FLOMAX ) 0.4 MG CAPS capsule Take 1 capsule (0.4 mg total) by mouth daily. 04/11/23   Geofm Glade PARAS, MD    Current Outpatient Medications  Medication Sig Dispense Refill   amLODipine  (NORVASC ) 10 MG tablet Take 1 tablet (10 mg total) by mouth daily. 90 tablet 3   atorvastatin  (LIPITOR) 10 MG tablet Take 1 tablet (10 mg total) by mouth daily. 90 tablet 3   brimonidine (ALPHAGAN) 0.2 % ophthalmic solution 1 drop 2 (two) times daily.     dorzolamide (TRUSOPT) 2 % ophthalmic solution SMARTSIG:In Eye(s)     empagliflozin  (JARDIANCE ) 25 MG TABS tablet Take 1 tablet (25 mg total) by mouth daily before breakfast. 90 tablet 3   finasteride (PROPECIA) 1 MG tablet Take 0.25 mg by mouth daily.     glipiZIDE  (GLUCOTROL ) 5 MG tablet Take 1 tablet (5 mg total) by mouth 2 (two) times daily before a meal. 180 tablet 1   Latanoprostene Bunod (VYZULTA) 0.024 % SOLN Place 1 drop into both eyes daily.     levothyroxine  (SYNTHROID ) 88 MCG tablet Take 1 tablet (88 mcg total) by mouth daily. 90 tablet 3   lisinopril  (ZESTRIL ) 2.5 MG tablet Take 1 tablet (2.5 mg total) by mouth daily. 90 tablet 3   Magnesium  Oxide  500 MG CAPS Take 1 capsule by mouth 2 (two) times daily.     metFORMIN  (GLUCOPHAGE -XR) 500 MG 24 hr tablet Take 2 tablets (1,000 mg total) by mouth 2 (two) times daily. 180 tablet 1   metoprolol  succinate (TOPROL -XL) 100 MG 24 hr tablet Take 2.5 tablets (250 mg total) by mouth daily. Take an extra 100 mg daily prn for palpitations.   Take with or immediately following a meal. 250 tablet 3   timolol (TIMOPTIC) 0.5 % ophthalmic solution 1 drop every morning.     apixaban  (ELIQUIS ) 5 MG TABS tablet Take 1 tablet (5 mg total) by mouth 2 (two) times  daily. 180 tablet 1   augmented betamethasone dipropionate (DIPROLENE-AF) 0.05 % cream Apply topically.     Semaglutide  (RYBELSUS ) 14 MG TABS Take 1 tablet (14 mg total) by mouth daily. TAKE ONE TABLET BY MOUTH ONE TIME DAILY 90 tablet 3   tamsulosin  (FLOMAX ) 0.4 MG CAPS capsule Take 1 capsule (0.4 mg total) by mouth daily. 90 capsule 2   Current Facility-Administered Medications  Medication Dose Route Frequency Provider Last Rate Last Admin   0.9 %  sodium chloride  infusion  500 mL Intravenous Once Naziah Portee, Gordy HERO, MD        Allergies as of 09/03/2023 - Review Complete 09/03/2023  Allergen Reaction Noted   Farxiga  [dapagliflozin ] Other (See Comments) 04/24/2018    Family History  Problem Relation Age of Onset   Diabetes Father    Stroke Father        mid 25s   Lung cancer Mother        non smoker   Stroke Mother 8   Testicular cancer Brother    Allergies Maternal Grandmother    Cancer Maternal Grandmother        unsure   Allergies Brother    Heart attack Neg Hx     Social History   Socioeconomic History   Marital status: Married    Spouse name: Inocente   Number of children: 2   Years of education: Not on file   Highest education level: Associate degree: occupational, Scientist, product/process development, or vocational program  Occupational History   Occupation: RETIRED/BUYER    Employer: [employer]  Tobacco Use   Smoking status: Former     Current packs/day: 0.00    Average packs/day: 0.9 packs/day for 8.0 years (7.2 ttl pk-yrs)    Types: Cigarettes    Start date: 01/16/1966    Quit date: 01/16/1974    Years since quitting: 49.6   Smokeless tobacco: Never   Tobacco comments:    smoked  1966- 1976,up to 1 ppd  Vaping Use   Vaping status: Never Used  Substance and Sexual Activity   Alcohol use: Yes    Comment: socially   Drug use: No   Sexual activity: Not on file  Other Topics Concern   Not on file  Social History Narrative   Livings with wife Inocente   Social Drivers of Health   Financial Resource Strain: Low Risk  (05/03/2023)   Overall Financial Resource Strain (CARDIA)    Difficulty of Paying Living Expenses: Not very hard  Food Insecurity: No Food Insecurity (05/03/2023)   Hunger Vital Sign    Worried About Running Out of Food in the Last Year: Never true    Ran Out of Food in the Last Year: Never true  Transportation Needs: No Transportation Needs (05/03/2023)   PRAPARE - Administrator, Civil Service (Medical): No    Lack of Transportation (Non-Medical): No  Physical Activity: Inactive (05/03/2023)   Exercise Vital Sign    Days of Exercise per Week: 0 days    Minutes of Exercise per Session: 0 min  Stress: No Stress Concern Present (05/03/2023)   [employer] of Occupational Health - Occupational Stress Questionnaire    Feeling of Stress : Not at all  Social Connections: Moderately Integrated (05/03/2023)   Social Connection and Isolation Panel    Frequency of Communication with Friends and Family: Twice a week    Frequency of Social Gatherings with Friends and Family: Never    Attends Religious Services: More  than 4 times per year    Active Member of Clubs or Organizations: Yes    Attends Banker Meetings: Never    Marital Status: Married  Catering manager Violence: Not At Risk (05/03/2023)   Humiliation, Afraid, Rape, and Kick questionnaire    Fear of Current or Ex-Partner: No     Emotionally Abused: No    Physically Abused: No    Sexually Abused: No    Physical Exam: Vital signs in last 24 hours: @BP  (!) 144/74   Pulse 78   Temp 99.1 F (37.3 C) (Temporal)   Ht 5' 8 (1.727 m)   Wt 172 lb (78 kg)   SpO2 98%   BMI 26.15 kg/m  GEN: NAD EYE: Sclerae anicteric ENT: MMM CV: Non-tachycardic Pulm: CTA b/l GI: Soft, NT/ND NEURO:  Alert & Oriented x 3   Gordy Starch, MD [facility]  09/03/2023 2:27 PM

## 2023-09-03 NOTE — Op Note (Signed)
 [facility] Patient Name: [name] Procedure Date: 09/03/2023 2:22 PM MRN: [id number] Endoscopist: Gordy CHRISTELLA Starch , MD, [id number] Age: 78 Referring MD:  Date of Birth: 1945-07-28 Gender: Male Account #: [id number] Procedure:                Colonoscopy Indications:              High risk colon cancer surveillance: Personal                            history of non-advanced adenoma, Last colonoscopy:                            August 2019 Medicines:                Monitored Anesthesia Care Procedure:                Pre-Anesthesia Assessment:                           - Prior to the procedure, a History and Physical                            was performed, and patient medications and                            allergies were reviewed. The patient's tolerance of                            previous anesthesia was also reviewed. The risks                            and benefits of the procedure and the sedation                            options and risks were discussed with the patient.                            All questions were answered, and informed consent                            was obtained. Prior Anticoagulants: The patient has                            taken Eliquis  (apixaban ), last dose was 3 days                            prior to procedure. ASA Grade Assessment: III - A                            patient with severe systemic disease. After                            reviewing the risks and benefits, the patient was  deemed in satisfactory condition to undergo the                            procedure.                           After obtaining informed consent, the colonoscope                            was passed under direct vision. Throughout the                            procedure, the patient's blood pressure, pulse, and                            oxygen saturations were monitored continuously. The                             Colonoscope was introduced through the anus and                            advanced to the cecum, identified by the                            appendiceal orifice. The colonoscopy was performed                            without difficulty. The patient tolerated the                            procedure well. The quality of the bowel                            preparation was good. The ileocecal valve,                            appendiceal orifice, and rectum were photographed. Scope In: 2:44:13 PM Scope Out: 2:59:11 PM Scope Withdrawal Time: 0 hours 10 minutes 26 seconds  Total Procedure Duration: 0 hours 14 minutes 58 seconds  Findings:                 The digital rectal exam was normal.                           A few small-mouthed diverticula were found in the                            hepatic flexure and ascending colon.                           Internal hemorrhoids were found during                            retroflexion. The hemorrhoids were small.  The exam was otherwise without abnormality. Complications:            No immediate complications. Estimated Blood Loss:     Estimated blood loss: none. Impression:               - Mild diverticulosis at the hepatic flexure and in                            the ascending colon.                           - Small internal hemorrhoids.                           - The examination was otherwise normal.                           - No specimens collected. Recommendation:           - Patient has a contact number available for                            emergencies. The signs and symptoms of potential                            delayed complications were discussed with the                            patient. Return to normal activities tomorrow.                            Written discharge instructions were provided to the                            patient.                           - Resume previous diet.                            - Continue present medications.                           - Resume Eliquis  (apixaban ) at prior dose today.                            Refer to managing physician for further adjustment                            of therapy.                           - No recommendation at this time regarding repeat                            colonoscopy due to age and no polyps on today's  exam. Gordy CHRISTELLA Starch, MD 09/03/2023 3:02:24 PM This report has been signed electronically.

## 2023-09-04 ENCOUNTER — Telehealth: Payer: Self-pay

## 2023-09-04 NOTE — Telephone Encounter (Signed)
  Follow up Call-     09/03/2023    1:22 PM  Call back number  Post procedure Call Back phone  # [phone]  Permission to leave phone message Yes     Patient questions:  Do you have a fever, pain , or abdominal swelling? No. Pain Score  0 *  Have you tolerated food without any problems? Yes.    Have you been able to return to your normal activities? Yes.    Do you have any questions about your discharge instructions: Diet   No. Medications  No. Follow up visit  No.  Do you have questions or concerns about your Care? No.  Actions: * If pain score is 4 or above: No action needed, pain <4.

## 2023-09-19 ENCOUNTER — Other Ambulatory Visit (INDEPENDENT_AMBULATORY_CARE_PROVIDER_SITE_OTHER)

## 2023-09-19 DIAGNOSIS — E039 Hypothyroidism, unspecified: Secondary | ICD-10-CM

## 2023-09-19 DIAGNOSIS — I10 Essential (primary) hypertension: Secondary | ICD-10-CM

## 2023-09-19 DIAGNOSIS — E7849 Other hyperlipidemia: Secondary | ICD-10-CM

## 2023-09-19 DIAGNOSIS — Z7984 Long term (current) use of oral hypoglycemic drugs: Secondary | ICD-10-CM

## 2023-09-19 DIAGNOSIS — I4819 Other persistent atrial fibrillation: Secondary | ICD-10-CM | POA: Diagnosis not present

## 2023-09-19 DIAGNOSIS — E1165 Type 2 diabetes mellitus with hyperglycemia: Secondary | ICD-10-CM

## 2023-09-19 DIAGNOSIS — E1151 Type 2 diabetes mellitus with diabetic peripheral angiopathy without gangrene: Secondary | ICD-10-CM

## 2023-09-19 DIAGNOSIS — Z7985 Long-term (current) use of injectable non-insulin antidiabetic drugs: Secondary | ICD-10-CM

## 2023-09-19 LAB — COMPREHENSIVE METABOLIC PANEL WITH GFR
ALT: 15 U/L (ref 0–53)
AST: 17 U/L (ref 0–37)
Albumin: 4.5 g/dL (ref 3.5–5.2)
Alkaline Phosphatase: 47 U/L (ref 39–117)
BUN: 18 mg/dL (ref 6–23)
CO2: 26 meq/L (ref 19–32)
Calcium: 9.8 mg/dL (ref 8.4–10.5)
Chloride: 101 meq/L (ref 96–112)
Creatinine, Ser: 0.95 mg/dL (ref 0.40–1.50)
GFR: 77.52 mL/min (ref >60.00)
Glucose, Bld: 123 mg/dL — ABNORMAL HIGH (ref 70–99)
Potassium: 4.5 meq/L (ref 3.5–5.1)
Sodium: 137 meq/L (ref 135–145)
Total Bilirubin: 0.6 mg/dL (ref 0.2–1.2)
Total Protein: 7.5 g/dL (ref 6.0–8.3)

## 2023-09-19 LAB — CBC WITH DIFFERENTIAL/PLATELET
Basophils Absolute: 0.1 K/uL (ref 0.0–0.1)
Basophils Relative: 1.7 % (ref 0.0–3.0)
Eosinophils Absolute: 0.5 K/uL (ref 0.0–0.7)
Eosinophils Relative: 7.8 % — ABNORMAL HIGH (ref 0.0–5.0)
HCT: 50.9 % (ref 39.0–52.0)
Hemoglobin: 17.2 g/dL — ABNORMAL HIGH (ref 13.0–17.0)
Lymphocytes Relative: 24.4 % (ref 12.0–46.0)
Lymphs Abs: 1.6 K/uL (ref 0.7–4.0)
MCHC: 33.9 g/dL (ref 30.0–36.0)
MCV: 88.3 fl (ref 78.0–100.0)
Monocytes Absolute: 0.7 K/uL (ref 0.1–1.0)
Monocytes Relative: 10.6 % (ref 3.0–12.0)
Neutro Abs: 3.6 K/uL (ref 1.4–7.7)
Neutrophils Relative %: 55.5 % (ref 43.0–77.0)
Platelets: 232 K/uL (ref 150.0–400.0)
RBC: 5.77 Mil/uL (ref 4.22–5.81)
RDW: 14.7 % (ref 11.5–15.5)
WBC: 6.5 K/uL (ref 4.0–10.5)

## 2023-09-19 LAB — LIPID PANEL
Cholesterol: 104 mg/dL (ref 0–200)
HDL: 49.4 mg/dL (ref >39.00)
LDL Cholesterol: 37 mg/dL (ref 0–99)
NonHDL: 54.91
Total CHOL/HDL Ratio: 2
Triglycerides: 91 mg/dL (ref 0.0–149.0)
VLDL: 18.2 mg/dL (ref 0.0–40.0)

## 2023-09-19 LAB — MICROALBUMIN / CREATININE URINE RATIO
Creatinine,U: 60.4 mg/dL
Microalb Creat Ratio: 45.8 mg/g — ABNORMAL HIGH (ref 0.0–30.0)
Microalb, Ur: 2.8 mg/dL — ABNORMAL HIGH (ref 0.0–1.9)

## 2023-09-19 LAB — TSH: TSH: 1.98 u[IU]/mL (ref 0.35–5.50)

## 2023-09-20 ENCOUNTER — Other Ambulatory Visit: Payer: Self-pay

## 2023-09-20 ENCOUNTER — Encounter: Payer: Self-pay | Admitting: Internal Medicine

## 2023-09-20 DIAGNOSIS — E1165 Type 2 diabetes mellitus with hyperglycemia: Secondary | ICD-10-CM

## 2023-09-21 ENCOUNTER — Encounter: Payer: Self-pay | Admitting: Internal Medicine

## 2023-09-21 ENCOUNTER — Other Ambulatory Visit: Payer: Self-pay

## 2023-09-21 ENCOUNTER — Telehealth: Payer: Self-pay | Admitting: Radiology

## 2023-09-21 DIAGNOSIS — E1165 Type 2 diabetes mellitus with hyperglycemia: Secondary | ICD-10-CM

## 2023-09-21 DIAGNOSIS — A4901 Methicillin susceptible Staphylococcus aureus infection, unspecified site: Secondary | ICD-10-CM | POA: Diagnosis not present

## 2023-09-21 DIAGNOSIS — D225 Melanocytic nevi of trunk: Secondary | ICD-10-CM | POA: Diagnosis not present

## 2023-09-21 DIAGNOSIS — Z1283 Encounter for screening for malignant neoplasm of skin: Secondary | ICD-10-CM | POA: Diagnosis not present

## 2023-09-21 LAB — HEMOGLOBIN A1C: Hgb A1c MFr Bld: 7.1 % — ABNORMAL HIGH (ref 4.6–6.5)

## 2023-09-21 NOTE — Telephone Encounter (Signed)
Responded back to patient via mychart

## 2023-09-21 NOTE — Telephone Encounter (Signed)
 Copied from CRM [id number]. Topic: General - Other >> Sep 21, 2023  2:03 PM Paige D wrote: Reason for CRM: pt calling to speak to miss karla in regards to a blood draw he is asking if he needs to come get this afternoon. Please reach out to pt asap.

## 2023-10-14 ENCOUNTER — Encounter: Payer: Self-pay | Admitting: Internal Medicine

## 2023-10-14 NOTE — Progress Notes (Unsigned)
 Subjective:    Patient ID: [name], male    DOB: 1945/03/07, 78 y.o.   MRN: [id number]     HPI [name] is here for follow up of his chronic medical problems.  Saw derm - has infection on back - on doxycycline, bactroban ointment.  He had another spot pop up and was on another 2 weeks of doxy.     A1c 7.1 and proteinuria -lisinopril  if BP is good - low-can decrease amlodipine  and increase lisinopril    Medications and allergies reviewed with patient and updated if appropriate.  Current Outpatient Medications on File Prior to Visit  Medication Sig Dispense Refill   COMBIGAN 0.2-0.5 % ophthalmic solution Apply to eye.     brimonidine (ALPHAGAN) 0.2 % ophthalmic solution 1 drop 2 (two) times daily.     dorzolamide (TRUSOPT) 2 % ophthalmic solution SMARTSIG:In Eye(s)     finasteride (PROPECIA) 1 MG tablet Take 0.25 mg by mouth daily.     Latanoprostene Bunod (VYZULTA) 0.024 % SOLN Place 1 drop into both eyes daily.     levothyroxine  (SYNTHROID ) 88 MCG tablet Take 1 tablet (88 mcg total) by mouth daily. 90 tablet 3   lisinopril  (ZESTRIL ) 2.5 MG tablet Take 1 tablet (2.5 mg total) by mouth daily. 90 tablet 3   Magnesium  Oxide 500 MG CAPS Take 1 capsule by mouth 2 (two) times daily.     metFORMIN  (GLUCOPHAGE -XR) 500 MG 24 hr tablet Take 2 tablets (1,000 mg total) by mouth 2 (two) times daily. 180 tablet 1   metoprolol  succinate (TOPROL -XL) 100 MG 24 hr tablet Take 2.5 tablets (250 mg total) by mouth daily. Take an extra 100 mg daily prn for palpitations.   Take with or immediately following a meal. 250 tablet 3   timolol (TIMOPTIC) 0.5 % ophthalmic solution 1 drop every morning.     No current facility-administered medications on file prior to visit.     Review of Systems  Constitutional:  Negative for fever.  Respiratory:  Negative for cough, shortness of breath and wheezing.   Cardiovascular:  Negative for chest pain, palpitations and leg swelling.  Neurological:   Negative for light-headedness and headaches.       Objective:   Vitals:   10/15/23 1009  BP: 120/72  Pulse: 66  Temp: 98 F (36.7 C)  SpO2: 98%   BP Readings from Last 3 Encounters:  10/15/23 120/72  09/03/23 124/66  08/23/23 120/84   Wt Readings from Last 3 Encounters:  10/15/23 171 lb (77.6 kg)  09/03/23 172 lb (78 kg)  08/23/23 170 lb (77.1 kg)   Body mass index is 26 kg/m.    Physical Exam Constitutional:      General: He is not in acute distress.    Appearance: Normal appearance. He is not ill-appearing.  HENT:     Head: Normocephalic and atraumatic.  Eyes:     Conjunctiva/sclera: Conjunctivae normal.  Cardiovascular:     Rate and Rhythm: Normal rate and regular rhythm.     Heart sounds: Normal heart sounds.  Pulmonary:     Effort: Pulmonary effort is normal. No respiratory distress.     Breath sounds: Normal breath sounds. No wheezing or rales.  Musculoskeletal:     Right lower leg: No edema.     Left lower leg: No edema.  Skin:    General: Skin is warm and dry.     Findings: No rash.  Neurological:     Mental Status:  He is alert. Mental status is at baseline.  Psychiatric:        Mood and Affect: Mood normal.        Lab Results  Component Value Date   WBC 6.5 09/19/2023   HGB 17.2 (H) 09/19/2023   HCT 50.9 09/19/2023   PLT 232.0 09/19/2023   GLUCOSE 123 (H) 09/19/2023   CHOL 104 09/19/2023   TRIG 91.0 09/19/2023   HDL 49.40 09/19/2023   LDLDIRECT 91.0 08/23/2016   LDLCALC 37 09/19/2023   ALT 15 09/19/2023   AST 17 09/19/2023   NA 137 09/19/2023   K 4.5 09/19/2023   CL 101 09/19/2023   CREATININE 0.95 09/19/2023   BUN 18 09/19/2023   CO2 26 09/19/2023   TSH 1.98 09/19/2023   PSA 0.76 04/25/2007   INR 2.6 09/26/2010   HGBA1C 7.1 (H) 09/21/2023   MICROALBUR 2.8 (H) 09/19/2023     Assessment & Plan:    See Problem List for Assessment and Plan of chronic medical problems.

## 2023-10-15 ENCOUNTER — Ambulatory Visit (INDEPENDENT_AMBULATORY_CARE_PROVIDER_SITE_OTHER): Admitting: Internal Medicine

## 2023-10-15 VITALS — BP 120/72 | HR 66 | Temp 98.0°F | Ht 68.0 in | Wt 171.0 lb

## 2023-10-15 DIAGNOSIS — E039 Hypothyroidism, unspecified: Secondary | ICD-10-CM | POA: Diagnosis not present

## 2023-10-15 DIAGNOSIS — E1159 Type 2 diabetes mellitus with other circulatory complications: Secondary | ICD-10-CM

## 2023-10-15 DIAGNOSIS — E119 Type 2 diabetes mellitus without complications: Secondary | ICD-10-CM

## 2023-10-15 DIAGNOSIS — N138 Other obstructive and reflux uropathy: Secondary | ICD-10-CM | POA: Diagnosis not present

## 2023-10-15 DIAGNOSIS — Z7984 Long term (current) use of oral hypoglycemic drugs: Secondary | ICD-10-CM | POA: Diagnosis not present

## 2023-10-15 DIAGNOSIS — I4819 Other persistent atrial fibrillation: Secondary | ICD-10-CM | POA: Diagnosis not present

## 2023-10-15 DIAGNOSIS — E1169 Type 2 diabetes mellitus with other specified complication: Secondary | ICD-10-CM

## 2023-10-15 DIAGNOSIS — E785 Hyperlipidemia, unspecified: Secondary | ICD-10-CM

## 2023-10-15 DIAGNOSIS — I152 Hypertension secondary to endocrine disorders: Secondary | ICD-10-CM

## 2023-10-15 DIAGNOSIS — N401 Enlarged prostate with lower urinary tract symptoms: Secondary | ICD-10-CM

## 2023-10-15 DIAGNOSIS — R739 Hyperglycemia, unspecified: Secondary | ICD-10-CM

## 2023-10-15 MED ORDER — LISINOPRIL 10 MG PO TABS: 10.0000 mg | ORAL_TABLET | Freq: Every day | ORAL | 2 refills | Status: AC

## 2023-10-15 MED ORDER — APIXABAN 5 MG PO TABS: 5.0000 mg | ORAL_TABLET | Freq: Two times a day (BID) | ORAL | 1 refills | Status: AC

## 2023-10-15 MED ORDER — GLIPIZIDE 5 MG PO TABS: ORAL_TABLET | ORAL | 1 refills | Status: AC

## 2023-10-15 MED ORDER — EMPAGLIFLOZIN 25 MG PO TABS: 25.0000 mg | ORAL_TABLET | Freq: Every day | ORAL | 3 refills | Status: DC

## 2023-10-15 MED ORDER — TAMSULOSIN HCL 0.4 MG PO CAPS: 0.4000 mg | ORAL_CAPSULE | Freq: Every day | ORAL | 2 refills | Status: AC

## 2023-10-15 MED ORDER — AMLODIPINE BESYLATE 10 MG PO TABS: 10.0000 mg | ORAL_TABLET | Freq: Every day | ORAL | 3 refills | Status: DC

## 2023-10-15 MED ORDER — RYBELSUS 14 MG PO TABS: 1.0000 | ORAL_TABLET | Freq: Every day | ORAL | 3 refills | Status: AC

## 2023-10-15 MED ORDER — GLIPIZIDE 5 MG PO TABS
5.0000 mg | ORAL_TABLET | Freq: Two times a day (BID) | ORAL | 1 refills | Status: DC
Start: 2023-10-15 — End: 2023-10-15

## 2023-10-15 MED ORDER — AMLODIPINE BESYLATE 5 MG PO TABS: 5.0000 mg | ORAL_TABLET | Freq: Every day | ORAL | 2 refills | Status: AC

## 2023-10-15 MED ORDER — ATORVASTATIN CALCIUM 10 MG PO TABS: 10.0000 mg | ORAL_TABLET | Freq: Every day | ORAL | 3 refills | Status: AC

## 2023-10-15 NOTE — Patient Instructions (Addendum)
      Blood work was ordered for your next visit.       Medications changes include :   increase glipizide  to 2 tabs with breakfast and 1 tab with dinner.  Decrease amlodipine  to 5 mg daily.  Increase lisinopril  to 10 mg daily.       Return in about 6 months (around 04/13/2024) for follow up.

## 2023-10-15 NOTE — Assessment & Plan Note (Addendum)
 Chronic Blood pressure controlled Cmp, cbc reviewed Has mild proteinuria so we will change blood pressure medications for that reason Continue metoprolol  XL 250 mg daily, increase lisinopril  to 10 mg daily, decrease amlodipine  to 5 mg daily Monitor BP at home

## 2023-10-15 NOTE — Assessment & Plan Note (Addendum)
 Chronic Associated with hyperlipidemia Proteinuria present  Lab Results  Component Value Date   HGBA1C 7.1 (H) 09/21/2023   Sugars not ideally controlled Increase glipizide  to 10 mg in the morning and 5 mg with dinner Continue Jardiance  25 mg daily, metformin  XR 1000 mg twice daily AC, Rybelsus  14 mg daily Stressed regular exercise, diabetic diet

## 2023-10-15 NOTE — Assessment & Plan Note (Signed)
 Chronic  Clinically euthyroid Lab Results  Component Value Date   TSH 1.98 09/19/2023   TSH in the normal range Continue levothyroxine  88 mcg daily

## 2023-10-15 NOTE — Assessment & Plan Note (Signed)
 Chronic Following with cardiology On Eliquis  5 mg twice daily, metoprolol  XL 250 mg daily CBC, CMP reviewed

## 2023-10-15 NOTE — Assessment & Plan Note (Signed)
 Chronic Regular exercise and healthy diet encouraged Lipid panel, CMP reviewed Lab Results  Component Value Date   LDLCALC 37 09/19/2023  LDL well-controlled Continue atorvastatin  10 mg daily

## 2023-10-15 NOTE — Assessment & Plan Note (Addendum)
 Chronic Controlled On tamsulosin  0.4 mg daily

## 2023-11-22 ENCOUNTER — Encounter: Payer: Self-pay | Admitting: Internal Medicine

## 2023-11-28 ENCOUNTER — Encounter: Payer: Self-pay | Admitting: Internal Medicine

## 2023-11-28 ENCOUNTER — Ambulatory Visit (INDEPENDENT_AMBULATORY_CARE_PROVIDER_SITE_OTHER): Admitting: Internal Medicine

## 2023-11-28 VITALS — BP 128/76 | HR 71 | Temp 98.1°F | Ht 68.0 in | Wt 169.0 lb

## 2023-11-28 DIAGNOSIS — R1032 Left lower quadrant pain: Secondary | ICD-10-CM | POA: Diagnosis not present

## 2023-11-28 DIAGNOSIS — E1169 Type 2 diabetes mellitus with other specified complication: Secondary | ICD-10-CM

## 2023-11-28 DIAGNOSIS — Z7984 Long term (current) use of oral hypoglycemic drugs: Secondary | ICD-10-CM

## 2023-11-28 DIAGNOSIS — E785 Hyperlipidemia, unspecified: Secondary | ICD-10-CM | POA: Diagnosis not present

## 2023-11-28 DIAGNOSIS — R8290 Unspecified abnormal findings in urine: Secondary | ICD-10-CM | POA: Insufficient documentation

## 2023-11-28 DIAGNOSIS — R1031 Right lower quadrant pain: Secondary | ICD-10-CM

## 2023-11-28 DIAGNOSIS — I152 Hypertension secondary to endocrine disorders: Secondary | ICD-10-CM | POA: Diagnosis not present

## 2023-11-28 DIAGNOSIS — E1159 Type 2 diabetes mellitus with other circulatory complications: Secondary | ICD-10-CM

## 2023-11-28 DIAGNOSIS — Z125 Encounter for screening for malignant neoplasm of prostate: Secondary | ICD-10-CM

## 2023-11-28 NOTE — Assessment & Plan Note (Signed)
 Acute Had episodes recently of lower abdominal cramping and cloudy urine He does not drink as many fluids as usual when he is traveling so the cloudy urine could be dehydration Has increased fluids We need to rule out UTI, prostatitis,?  Urinary retention given decreased urination UA, urine culture, PSA

## 2023-11-28 NOTE — Progress Notes (Signed)
 Subjective:    Patient ID: [name], male    DOB: 1945/07/11, 78 y.o.   MRN: [id number]      HPI [name] is here for  Chief Complaint  Patient presents with   Abdominal Pain    Stomach pain was eased up since he has returned; Urine was cloudy at one point   Back Pain    Back pain (still has a little bit)     Discussed the use of AI scribe software for clinical note transcription with the patient, who gave verbal consent to proceed.  History of Present Illness [name] is a 78 year old male who presents with abdominal cramping and back pain.  He has been experiencing abdominal cramping for two weeks, which began while on a cruise. The cramping is located in the lower abdomen and is accompanied by noise and regurgitation. The pain was severe enough to cause withdrawal from activities and recurred with increased severity while traveling in [place] , radiating to his back and causing him to be doubled over. Physical activity such as bending over and packing exacerbated the pain, while rest provided relief. The pain would reactivate within an hour of activity.  His bowel movements are generally normal, though he experienced one day of diarrhea. No consistent constipation, but he acknowledges it as a common issue when traveling. He reports cloudy urine, which he associates with dehydration, and notes a decrease in urination flow despite taking Flomax . No blood in the urine, fever, chills, or significant nausea, though he experienced brief nausea during the peak of his pain. He also reports mild heartburn, attributed to dietary changes while on vacation.  He has been off Rybelsus . Since returning home, his symptoms have improved, with only mild residual cramping that does not radiate to his back. He reports feeling extremely thirsty a few days ago and has been experiencing fatigue, feeling tired even after waking up. He reports feeling tired even after waking up and  sometimes going back to sleep, but denies symptoms of sleep apnea such as stopping breathing or snoring, as per his own and Nancy's observations.     Medications and allergies reviewed with patient and updated if appropriate.  Current Outpatient Medications on File Prior to Visit  Medication Sig Dispense Refill   amLODipine  (NORVASC ) 5 MG tablet Take 1 tablet (5 mg total) by mouth daily. 90 tablet 2   apixaban  (ELIQUIS ) 5 MG TABS tablet Take 1 tablet (5 mg total) by mouth 2 (two) times daily. 180 tablet 1   atorvastatin  (LIPITOR) 10 MG tablet Take 1 tablet (10 mg total) by mouth daily. 90 tablet 3   brimonidine (ALPHAGAN) 0.2 % ophthalmic solution 1 drop 2 (two) times daily.     COMBIGAN 0.2-0.5 % ophthalmic solution Apply to eye.     dorzolamide (TRUSOPT) 2 % ophthalmic solution SMARTSIG:In Eye(s)     empagliflozin  (JARDIANCE ) 25 MG TABS tablet Take 1 tablet (25 mg total) by mouth daily before breakfast. 90 tablet 3   finasteride (PROPECIA) 1 MG tablet Take 0.25 mg by mouth daily.     glipiZIDE  (GLUCOTROL ) 5 MG tablet Take 2 tabs before breakfast and 1 tab before dinner 270 tablet 1   Latanoprostene Bunod (VYZULTA) 0.024 % SOLN Place 1 drop into both eyes daily.     levothyroxine  (SYNTHROID ) 88 MCG tablet Take 1 tablet (88 mcg total) by mouth daily. 90 tablet 3   lisinopril  (ZESTRIL ) 10 MG tablet Take 1 tablet (10 mg  total) by mouth daily. 90 tablet 2   Magnesium  Oxide 500 MG CAPS Take 1 capsule by mouth 2 (two) times daily.     metFORMIN  (GLUCOPHAGE -XR) 500 MG 24 hr tablet Take 2 tablets (1,000 mg total) by mouth 2 (two) times daily. 180 tablet 1   metoprolol  succinate (TOPROL -XL) 100 MG 24 hr tablet Take 2.5 tablets (250 mg total) by mouth daily. Take an extra 100 mg daily prn for palpitations.   Take with or immediately following a meal. 250 tablet 3   tamsulosin  (FLOMAX ) 0.4 MG CAPS capsule Take 1 capsule (0.4 mg total) by mouth daily. 90 capsule 2   timolol (TIMOPTIC) 0.5 % ophthalmic  solution 1 drop every morning.     Semaglutide  (RYBELSUS ) 14 MG TABS Take 1 tablet (14 mg total) by mouth daily. TAKE ONE TABLET BY MOUTH ONE TIME DAILY (Patient not taking: Reported on 11/28/2023) 90 tablet 3   No current facility-administered medications on file prior to visit.    Review of Systems  Constitutional:  Negative for chills and fever.  Gastrointestinal:  Positive for abdominal pain (lower abd cramping), constipation (a ltitle) and nausea (a little with severe pain). Negative for blood in stool (no melena) and diarrhea.       A little GERD - better   Genitourinary:  Positive for decreased urine volume (slightly), difficulty urinating (a little - weak) and frequency. Negative for hematuria and urgency.       Cloudy urine  Neurological:  Negative for light-headedness and headaches.       Objective:   Vitals:   11/28/23 1603  BP: 128/76  Pulse: 71  Temp: 98.1 F (36.7 C)  SpO2: 99%   BP Readings from Last 3 Encounters:  11/28/23 128/76  10/15/23 120/72  09/03/23 124/66   Wt Readings from Last 3 Encounters:  11/28/23 169 lb (76.7 kg)  10/15/23 171 lb (77.6 kg)  09/03/23 172 lb (78 kg)   Body mass index is 25.7 kg/m.    Physical Exam Constitutional:      General: He is not in acute distress.    Appearance: Normal appearance. He is not ill-appearing.  HENT:     Head: Normocephalic and atraumatic.  Abdominal:     General: There is no distension.     Palpations: Abdomen is soft.     Tenderness: There is no abdominal tenderness. There is no right CVA tenderness, left CVA tenderness, guarding or rebound.  Skin:    General: Skin is warm and dry.  Neurological:     Mental Status: He is alert.            Assessment & Plan:    See Problem List for Assessment and Plan of chronic medical problems.

## 2023-11-28 NOTE — Patient Instructions (Addendum)
      Blood work was ordered.       Medications changes include :   None      Return if symptoms worsen or fail to improve.

## 2023-11-28 NOTE — Assessment & Plan Note (Signed)
 Chronic Blood pressure controlled Continue metoprolol  XL 250 mg daily, lisinopril  10 mg daily,  amlodipine  to 5 mg daily Monitor BP at home

## 2023-11-28 NOTE — Assessment & Plan Note (Signed)
 Chronic Associated with hyperlipidemia Proteinuria present  Lab Results  Component Value Date   HGBA1C 7.1 (H) 09/21/2023   Sugars fairly controlled Continue glipizide  to 10 mg in the morning and 5 mg with dinner, Jardiance  25 mg daily, metformin  XR 1000 mg twice daily AC Continue to hold Rybelsus  14 mg daily for now Stressed regular exercise, diabetic diet

## 2023-11-28 NOTE — Assessment & Plan Note (Signed)
 Acute Lower abdominal cramping that was severe at times Started while traveling Maybe slight constipation Some cloudy urine-possibly from dehydration Stopped Rybelsus  and that did seem to help, but still has some symptoms Has had a couple prior episodes-last 1 in December 2024 Symptoms much improved, but had some very slight cramping but still noticeable ?  Related to constipation, diverticulosis, UTI, urinary retention, prostatitis Check UA, urine culture, CBC, CMP, PSA Continue to hold Rybelsus  Continue increase fluids Try to maintain normal bowels Hold off on imaging since symptoms are improving Depending on blood work/urine we will determine further evaluation Can consider trying half a dose Rybelsus  once his symptoms completely resolved Has urology appointment soon

## 2023-11-29 ENCOUNTER — Ambulatory Visit: Payer: Self-pay | Admitting: Internal Medicine

## 2023-11-29 DIAGNOSIS — N5089 Other specified disorders of the male genital organs: Secondary | ICD-10-CM

## 2023-11-29 LAB — URINALYSIS, ROUTINE W REFLEX MICROSCOPIC
Bilirubin Urine: NEGATIVE
Ketones, ur: NEGATIVE
Nitrite: POSITIVE — AB
Specific Gravity, Urine: <=1.005 — AB (ref 1.000–1.030)
Total Protein, Urine: NEGATIVE
Urine Glucose: >=1000 — AB
Urobilinogen, UA: 0.2 (ref 0.0–1.0)
pH: 6 (ref 5.0–8.0)

## 2023-11-29 LAB — CBC WITH DIFFERENTIAL/PLATELET
Basophils Absolute: 0.1 K/uL (ref 0.0–0.1)
Basophils Relative: 1.3 % (ref 0.0–3.0)
Eosinophils Absolute: 0.6 K/uL (ref 0.0–0.7)
Eosinophils Relative: 7 % — ABNORMAL HIGH (ref 0.0–5.0)
HCT: 46.1 % (ref 39.0–52.0)
Hemoglobin: 15.6 g/dL (ref 13.0–17.0)
Lymphocytes Relative: 23.1 % (ref 12.0–46.0)
Lymphs Abs: 1.9 K/uL (ref 0.7–4.0)
MCHC: 33.9 g/dL (ref 30.0–36.0)
MCV: 88.7 fl (ref 78.0–100.0)
Monocytes Absolute: 1.1 K/uL — ABNORMAL HIGH (ref 0.1–1.0)
Monocytes Relative: 13.1 % — ABNORMAL HIGH (ref 3.0–12.0)
Neutro Abs: 4.6 K/uL (ref 1.4–7.7)
Neutrophils Relative %: 55.5 % (ref 43.0–77.0)
Platelets: 228 K/uL (ref 150.0–400.0)
RBC: 5.2 Mil/uL (ref 4.22–5.81)
RDW: 14.9 % (ref 11.5–15.5)
WBC: 8.3 K/uL (ref 4.0–10.5)

## 2023-11-29 LAB — COMPREHENSIVE METABOLIC PANEL WITH GFR
ALT: 11 U/L (ref 0–53)
AST: 13 U/L (ref 0–37)
Albumin: 4.5 g/dL (ref 3.5–5.2)
Alkaline Phosphatase: 54 U/L (ref 39–117)
BUN: 18 mg/dL (ref 6–23)
CO2: 26 meq/L (ref 19–32)
Calcium: 9.8 mg/dL (ref 8.4–10.5)
Chloride: 106 meq/L (ref 96–112)
Creatinine, Ser: 0.93 mg/dL (ref 0.40–1.50)
GFR: 79.42 mL/min (ref >60.00)
Glucose, Bld: 123 mg/dL — ABNORMAL HIGH (ref 70–99)
Potassium: 4.5 meq/L (ref 3.5–5.1)
Sodium: 144 meq/L (ref 135–145)
Total Bilirubin: 0.7 mg/dL (ref 0.2–1.2)
Total Protein: 7.2 g/dL (ref 6.0–8.3)

## 2023-11-29 LAB — PSA, MEDICARE: PSA: 0.63 ng/mL (ref 0.10–4.00)

## 2023-11-29 MED ORDER — CIPROFLOXACIN HCL 500 MG PO TABS: 500.0000 mg | ORAL_TABLET | Freq: Two times a day (BID) | ORAL | 0 refills | Status: AC

## 2023-12-01 LAB — URINE CULTURE

## 2023-12-04 MED ORDER — FLUTICASONE PROPIONATE 50 MCG/ACT NA SUSP: 2.0000 | Freq: Every day | NASAL | 6 refills | Status: AC

## 2023-12-05 ENCOUNTER — Ambulatory Visit: Admitting: Internal Medicine

## 2023-12-05 NOTE — Addendum Note (Signed)
 Addended by: GEOFM GLADE PARAS on: 12/05/2023 08:18 PM   Modules accepted: Orders

## 2023-12-11 DIAGNOSIS — N528 Other male erectile dysfunction: Secondary | ICD-10-CM | POA: Diagnosis not present

## 2023-12-11 DIAGNOSIS — N3020 Other chronic cystitis without hematuria: Secondary | ICD-10-CM | POA: Diagnosis not present

## 2023-12-11 DIAGNOSIS — Z87442 Personal history of urinary calculi: Secondary | ICD-10-CM | POA: Diagnosis not present

## 2023-12-30 ENCOUNTER — Encounter: Payer: Self-pay | Admitting: Internal Medicine

## 2023-12-31 ENCOUNTER — Other Ambulatory Visit: Payer: Self-pay

## 2023-12-31 DIAGNOSIS — E119 Type 2 diabetes mellitus without complications: Secondary | ICD-10-CM

## 2023-12-31 MED ORDER — EMPAGLIFLOZIN 25 MG PO TABS: 25.0000 mg | ORAL_TABLET | Freq: Every day | ORAL | 3 refills | Status: AC

## 2024-01-01 ENCOUNTER — Other Ambulatory Visit: Payer: Self-pay | Admitting: Internal Medicine

## 2024-01-03 LAB — OPHTHALMOLOGY REPORT-SCANNED

## 2024-01-23 ENCOUNTER — Encounter (INDEPENDENT_AMBULATORY_CARE_PROVIDER_SITE_OTHER): Payer: Medicare Other | Admitting: Ophthalmology

## 2024-03-11 ENCOUNTER — Encounter (INDEPENDENT_AMBULATORY_CARE_PROVIDER_SITE_OTHER): Admitting: Ophthalmology

## 2024-04-10 ENCOUNTER — Ambulatory Visit: Admitting: Internal Medicine

## 2024-04-14 ENCOUNTER — Ambulatory Visit: Admitting: Internal Medicine

## 2024-05-08 ENCOUNTER — Ambulatory Visit
